# Patient Record
Sex: Female | Born: 1940 | Race: White | Hispanic: No | Smoking: Never smoker
Health system: Southern US, Community
[De-identification: ages and names within clinical notes are randomized; demographics above are authoritative.]

## PROBLEM LIST (undated history)

## (undated) DIAGNOSIS — I5022 Chronic systolic (congestive) heart failure: Secondary | ICD-10-CM

## (undated) DIAGNOSIS — Z45018 Encounter for adjustment and management of other part of cardiac pacemaker: Secondary | ICD-10-CM

## (undated) DIAGNOSIS — Z923 Personal history of irradiation: Secondary | ICD-10-CM

## (undated) DIAGNOSIS — J302 Other seasonal allergic rhinitis: Secondary | ICD-10-CM

## (undated) DIAGNOSIS — Z950 Presence of cardiac pacemaker: Secondary | ICD-10-CM

## (undated) DIAGNOSIS — I495 Sick sinus syndrome: Secondary | ICD-10-CM

## (undated) DIAGNOSIS — E039 Hypothyroidism, unspecified: Secondary | ICD-10-CM

## (undated) DIAGNOSIS — G43909 Migraine, unspecified, not intractable, without status migrainosus: Secondary | ICD-10-CM

## (undated) DIAGNOSIS — C50912 Malignant neoplasm of unspecified site of left female breast: Secondary | ICD-10-CM

## (undated) DIAGNOSIS — E079 Disorder of thyroid, unspecified: Secondary | ICD-10-CM

## (undated) DIAGNOSIS — Z9889 Other specified postprocedural states: Secondary | ICD-10-CM

## (undated) DIAGNOSIS — Z8719 Personal history of other diseases of the digestive system: Secondary | ICD-10-CM

## (undated) DIAGNOSIS — R112 Nausea with vomiting, unspecified: Secondary | ICD-10-CM

## (undated) DIAGNOSIS — K219 Gastro-esophageal reflux disease without esophagitis: Secondary | ICD-10-CM

## (undated) DIAGNOSIS — I10 Essential (primary) hypertension: Secondary | ICD-10-CM

## (undated) DIAGNOSIS — Z8711 Personal history of peptic ulcer disease: Secondary | ICD-10-CM

## (undated) DIAGNOSIS — E785 Hyperlipidemia, unspecified: Secondary | ICD-10-CM

## (undated) HISTORY — DX: Essential (primary) hypertension: I10

## (undated) HISTORY — DX: Migraine, unspecified, not intractable, without status migrainosus: G43.909

## (undated) HISTORY — PX: TONSILLECTOMY: SUR1361

## (undated) HISTORY — PX: VAGINAL HYSTERECTOMY: SUR661

## (undated) HISTORY — DX: Gastro-esophageal reflux disease without esophagitis: K21.9

## (undated) HISTORY — PX: CERVICAL DISC SURGERY: SHX588

## (undated) HISTORY — DX: Disorder of thyroid, unspecified: E07.9

## (undated) HISTORY — PX: FOOT SURGERY: SHX648

## (undated) HISTORY — DX: Hyperlipidemia, unspecified: E78.5

---
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
## 1998-08-27 ENCOUNTER — Other Ambulatory Visit: Admission: RE | Admit: 1998-08-27 | Discharge: 1998-08-27 | Payer: Self-pay | Admitting: Obstetrics and Gynecology

## 1999-08-28 ENCOUNTER — Other Ambulatory Visit: Admission: RE | Admit: 1999-08-28 | Discharge: 1999-08-28 | Payer: Self-pay | Admitting: Obstetrics and Gynecology

## 2000-10-27 ENCOUNTER — Other Ambulatory Visit: Admission: RE | Admit: 2000-10-27 | Discharge: 2000-10-27 | Payer: Self-pay | Admitting: Obstetrics and Gynecology

## 2001-11-22 ENCOUNTER — Other Ambulatory Visit: Admission: RE | Admit: 2001-11-22 | Discharge: 2001-11-22 | Payer: Self-pay | Admitting: Obstetrics and Gynecology

## 2002-12-29 ENCOUNTER — Other Ambulatory Visit: Admission: RE | Admit: 2002-12-29 | Discharge: 2002-12-29 | Payer: Self-pay | Admitting: Obstetrics and Gynecology

## 2003-01-10 ENCOUNTER — Ambulatory Visit (HOSPITAL_COMMUNITY): Admission: RE | Admit: 2003-01-10 | Discharge: 2003-01-10 | Payer: Self-pay | Admitting: *Deleted

## 2003-01-10 HISTORY — PX: UPPER GASTROINTESTINAL ENDOSCOPY: SHX188

## 2003-01-10 HISTORY — PX: COLONOSCOPY: SHX174

## 2004-04-10 ENCOUNTER — Other Ambulatory Visit: Admission: RE | Admit: 2004-04-10 | Discharge: 2004-04-10 | Payer: Self-pay | Admitting: Obstetrics and Gynecology

## 2004-06-19 ENCOUNTER — Ambulatory Visit (HOSPITAL_COMMUNITY): Admission: RE | Admit: 2004-06-19 | Discharge: 2004-06-19 | Payer: Self-pay | Admitting: Cardiology

## 2005-07-01 ENCOUNTER — Other Ambulatory Visit: Admission: RE | Admit: 2005-07-01 | Discharge: 2005-07-01 | Payer: Self-pay | Admitting: Obstetrics and Gynecology

## 2007-07-05 ENCOUNTER — Emergency Department (HOSPITAL_COMMUNITY): Admission: EM | Admit: 2007-07-05 | Discharge: 2007-07-05 | Payer: Self-pay | Admitting: Emergency Medicine

## 2007-10-22 ENCOUNTER — Encounter: Admission: RE | Admit: 2007-10-22 | Discharge: 2007-10-22 | Payer: Self-pay | Admitting: Internal Medicine

## 2008-01-19 ENCOUNTER — Ambulatory Visit: Payer: Self-pay | Admitting: Internal Medicine

## 2008-01-19 ENCOUNTER — Emergency Department (HOSPITAL_COMMUNITY): Admission: EM | Admit: 2008-01-19 | Discharge: 2008-01-19 | Payer: Self-pay | Admitting: Emergency Medicine

## 2008-02-07 ENCOUNTER — Ambulatory Visit: Payer: Self-pay | Admitting: Internal Medicine

## 2008-02-07 DIAGNOSIS — R05 Cough: Secondary | ICD-10-CM | POA: Insufficient documentation

## 2008-02-07 DIAGNOSIS — E785 Hyperlipidemia, unspecified: Secondary | ICD-10-CM

## 2008-02-07 DIAGNOSIS — E039 Hypothyroidism, unspecified: Secondary | ICD-10-CM | POA: Insufficient documentation

## 2008-02-07 DIAGNOSIS — J449 Chronic obstructive pulmonary disease, unspecified: Secondary | ICD-10-CM | POA: Insufficient documentation

## 2008-02-07 DIAGNOSIS — I10 Essential (primary) hypertension: Secondary | ICD-10-CM | POA: Insufficient documentation

## 2008-02-07 DIAGNOSIS — J45909 Unspecified asthma, uncomplicated: Secondary | ICD-10-CM | POA: Insufficient documentation

## 2008-02-07 LAB — CONVERTED CEMR LAB: IgE (Immunoglobulin E), Serum: 67.2 intl units/mL (ref 0.0–180.0)

## 2008-02-28 ENCOUNTER — Encounter: Payer: Self-pay | Admitting: Internal Medicine

## 2008-04-14 ENCOUNTER — Ambulatory Visit: Payer: Self-pay | Admitting: Internal Medicine

## 2008-05-29 ENCOUNTER — Ambulatory Visit: Payer: Self-pay | Admitting: Internal Medicine

## 2008-06-14 ENCOUNTER — Encounter: Admission: RE | Admit: 2008-06-14 | Discharge: 2008-06-14 | Payer: Self-pay | Admitting: Obstetrics and Gynecology

## 2008-07-10 ENCOUNTER — Ambulatory Visit: Payer: Self-pay | Admitting: Internal Medicine

## 2008-07-17 ENCOUNTER — Encounter: Admission: RE | Admit: 2008-07-17 | Discharge: 2008-07-17 | Payer: Self-pay | Admitting: Obstetrics and Gynecology

## 2008-08-10 ENCOUNTER — Ambulatory Visit: Payer: Self-pay | Admitting: Internal Medicine

## 2008-09-18 ENCOUNTER — Ambulatory Visit: Payer: Self-pay | Admitting: Internal Medicine

## 2008-09-18 DIAGNOSIS — M542 Cervicalgia: Secondary | ICD-10-CM | POA: Insufficient documentation

## 2008-10-23 ENCOUNTER — Ambulatory Visit: Payer: Self-pay | Admitting: Internal Medicine

## 2008-10-23 DIAGNOSIS — J45901 Unspecified asthma with (acute) exacerbation: Secondary | ICD-10-CM | POA: Insufficient documentation

## 2008-10-30 ENCOUNTER — Telehealth (INDEPENDENT_AMBULATORY_CARE_PROVIDER_SITE_OTHER): Payer: Self-pay | Admitting: *Deleted

## 2008-10-30 ENCOUNTER — Ambulatory Visit: Payer: Self-pay | Admitting: Internal Medicine

## 2008-10-30 DIAGNOSIS — R609 Edema, unspecified: Secondary | ICD-10-CM

## 2008-11-02 ENCOUNTER — Ambulatory Visit: Payer: Self-pay | Admitting: Internal Medicine

## 2008-11-20 ENCOUNTER — Ambulatory Visit: Payer: Self-pay | Admitting: Internal Medicine

## 2009-05-17 ENCOUNTER — Ambulatory Visit: Payer: Self-pay | Admitting: Internal Medicine

## 2009-05-17 DIAGNOSIS — J019 Acute sinusitis, unspecified: Secondary | ICD-10-CM | POA: Insufficient documentation

## 2009-06-04 ENCOUNTER — Ambulatory Visit: Payer: Self-pay | Admitting: Internal Medicine

## 2009-06-04 DIAGNOSIS — R0982 Postnasal drip: Secondary | ICD-10-CM | POA: Insufficient documentation

## 2009-06-04 DIAGNOSIS — L508 Other urticaria: Secondary | ICD-10-CM | POA: Insufficient documentation

## 2009-06-07 ENCOUNTER — Encounter: Payer: Self-pay | Admitting: Internal Medicine

## 2009-07-17 ENCOUNTER — Encounter: Payer: Self-pay | Admitting: Internal Medicine

## 2009-07-18 ENCOUNTER — Encounter: Admission: RE | Admit: 2009-07-18 | Discharge: 2009-07-18 | Payer: Self-pay | Admitting: Obstetrics and Gynecology

## 2010-09-03 ENCOUNTER — Encounter: Admission: RE | Admit: 2010-09-03 | Discharge: 2010-09-03 | Payer: Self-pay | Admitting: Obstetrics and Gynecology

## 2010-12-08 ENCOUNTER — Encounter: Payer: Self-pay | Admitting: Obstetrics and Gynecology

## 2011-04-01 NOTE — H&P (Signed)
NAME:  LEXANDRA, RETTKE               ACCOUNT NO.:  [id number]   MEDICAL RECORD NO.:  [id number]          PATIENT TYPE:  EMS   LOCATION:  MAJO                         FACILITY:  [facility]   PHYSICIAN:  Kalman Shan, MD   DATE OF BIRTH:  1941-01-14   DATE OF ADMISSION:  01/19/2008  DATE OF DISCHARGE:                              HISTORY & PHYSICAL   CHIEF COMPLAINT:  Shortness of breath and cough.   REFERRING PHYSICIAN:  Kerry Kass, M.D. [facility].   HISTORY OF PRESENT ILLNESS:  Clarine Elrod is a pleasant 70 year old  woman referred by Dr. Stevphen Rochester for elevation down in the emergency  room.  Earlier today I got a call from Dr. Corinda Gubler asking me to evaluate  the patient in the emergency room for worsening respiratory status and  possible admission.  The patient's history fluctuates. The best history  that I could elicit is that ever since April 2008 she has had on and off  cough that comes and goes randomly.  It is decreased with inhalers and  nebulized albuterol.  In October 2008 she saw Dr. Stevphen Rochester, who  suspected that she could have allergies and she has been on a low-dose  allergy shot for the last couple of weeks.  She describes this cough as  episodic, although at times she states that she has daily cough,  especially at night when she lies down.  Of note, she had chronic  postnasal drip all her life she says, and has been on nasal steroids,  possibly for several years.  However, I am not sure how compliant she  has been and her last of some sort of a nasal inhaler was yesterday.  In  addition, she has acid reflux disease which she thinks is quiescent  right now, although she has been on lisinopril hydrochlorothiazide for a  couple of years now.   The new complaint really is shortness of breath that started on January 17, 2008.  She describes this as episodic dyspnea.  She clearly says it is  new and it is now happening at rest.  She gets dyspneic when climbing  13  steps or taking a shower, but not with changing of clothes.  Albuterol  inhaler nebulizer improves dyspnea.  Taking a rest improves dyspnea.  This is the first dyspnea ever.  Prior to that, she never had dyspnea.   History of bronchitis.  It is unclear to me what she is describing as  bronchitis, but she says she has had 10 or 20 episodes since April 2008,  and each of these episodes are characterized by cough and colored  sputum.  Currently she has 1 such episode which has been going on for 1  week; no fever though.  She says for a similar episode back in October  2008 she went to [facility], and she was given high-dose  steroids and was told she had bronchitis,  but these steroids made her  jittery and she is uncomfortable taking prednisone.  Her last antibiotic  course was a Z-PAK in the summer of 2008.  Currently she is not on any  antibiotics.   PAST MEDICAL HISTORY:  1. Obesity.  2. Postnasal drip.  3. Acid reflux disease, currently quiescent per history.  4. Stomach peptic ulcer disease, not otherwise specified, several      years ago, currently quiet.  5. Allergic rhinitis diagnosed in October 2008.  6. Hyperlipidemia.  7. Hypothyroidism.  8. Denies asthma, COPD, obstructive sleep apnea, stroke or diabetes      mellitus.   FAMILY HISTORY:  Dad and a sister have asthma.  A great nephew also has  asthma.   ALLERGIES:  No known allergies.  She says that prednisone at high dose  made her jittery and insomniac.  She is willing to try prednisone at a  lower dose.  She is unclear at what dose and what duration of prednisone  made her have these symptoms.   SOCIAL HISTORY:  Never smoked.  No passive smoking.  Husband does not  smoke.  She is a housewife.  Husband is a Archivist.  Husband has  pulmonary fibrosis.  She has 2 kids.  [place]   REVIEW OF SYSTEMS:  As in history of present illness.  Currently  positive for cough,  yellow sputum, shortness of breath.  Denies chest  pain.  Denies hemoptysis.  Denies paroxysmal nocturnal dyspnea.  Denies  orthopnea.  Denies fever.  Denies edema.   MEDICATION LIST:  Includes lisinopril hydrochlorothiazide, Tricor,  estradiol, Synthroid, Symbicort.  Of note, she has been on Symbicort for  a few months.  She says she is compliant with this.   Nasal inhalers.  She did not give this as a med list, but on  questioning, she says she is on some sort of a nasal inhaler, presumed  steroids.   PHYSICAL EXAMINATION:  VITAL SIGNS:  Temperature 97.2 at 11:55, 98.3 at  2:30 p.m.  Pulse of 75-85.  Respiratory rate 18-20.  Saturation 95% on  room air.  GENERAL EXAM:  Alert and oriented x3, sitting comfortably on the  stretcher on bed 4 gurney in the emergency room.  Speech normal.  Ambulation normal.  Moves all 4 extremities normally.  RESPIRATORY EXAM:  Trachea is central.  Air entry equal.  Emergency room  noted wheeze, but at the time of exam, she had already gotten 1  albuterol nebulizer, and I did not notice any wheeze.  Air entry was  equal on both sides.  No crepitations.  CARDIOVASCULAR:  Normal heart sounds.  No murmurs.  NECK:  Supple.  Mallampati class II-III.  No elevated JVP.  No neck  nodes.  ABDOMEN:  Obese, soft, nontender.  No organomegaly.  No mass.  EXTREMITIES:  No cyanosis, no clubbing, no edema.  SKIN:  Intact.  MUSCULOSKELETAL EXAM:  No joint deformities.  Back exam is normal.   LABORATORY VALUES:  1. Arterial blood gas:  pH 7.46, pCO2 33, P02 76.  2. Chest x-ray:  This was done at Dr. Blossom Hoops office.  I do not have      a view box in the emergency room because it is an all digital      system now, but looking at it against the backdrop of a ceiling      fluorescent lamp, the x-ray looked normal.  3. D-dimer is normal.  4. BNP is less than 30.  CK and troponin are normal  5. Chemistries are all normal.  Creatinine 0.7, bicarb 26, potassium  is  low normal at 3.5.  Of note, she is on hydrochlorothiazide.      Magnesium and phosphorous are normal.  6. Complete blood count:  White count 8.1, hemoglobin 13.7, platelets      217,000.  7. CK and troponin.  These are pending at the time of my dictation.  8. Spirometry.  FEV1 1.23 liters, 63% predicted, FVC 2.12 liters, 77%      predicted.  FEV1/FVC ratio reduced at 58 against a normal of 76.      In that sense, PFTs show a moderate degree of obstruction.  Of      note, this is while on Symbicort.   ASSESSMENT:  This is a 70 year old obese nonsmoker who is on lisinopril  for hypertension.  She has had on-and-off cough since April 2008.  In  addition, for the last week she seems to have an acute flare up of  possible asthma with yellow sputum and worsening cough in association  with worsening shortness of breath.  There is no chest pain.  Laboratory  elevation, the only thing significant is a moderate degree of  obstruction on spirometry.  She is already on asthma treatment with  Symbicort for the last few months.   I believe she has acute asthma exacerbation, probably viral induced or  bacterial.  Chest x-ray is clear.  There is no obvious pneumonia.  In  addition, her cough could be cause by lisinopril.   PLAN:  1. I recommend stopping the lisinopril.  2. Talk to the primary care physician and start another anti-      hypertensive drug.  3. Optimize asthma medication with maintenance medication with      Symbicort, Nasonex, and albuterol.  These have been written out.  4. Treat acute asthma exacerbation with a 12 day trial of prednisone      starting at 40 mg and ending at 5 mg per day.  I have cautioned her      that some sort of insomnia and jitteriness is okay and as the dose      goes down it should be worse, in case there is any manic episode,      then she should call.  5. Follow up in clinic in 2 weeks.  I have given her my card.  6. In the interim, if symptoms worsen,  she is to go to the emergency      room.  7. Once patient is stable on followup, I will return her to Dr. Stevphen Rochester   Of note, her potassium is 3.5, and I gave her p.o. K in the emergency  room, 1 dose p.o. prednisone given in the emergency room.   D/W Dr. Delano Metz over telephone. Thank you for referral.      Kalman Shan, MD  Electronically Signed     MR/MEDQ  D:  01/19/2008  T:  01/19/2008  Job:  [id number]   cc:   Kerry Kass, M.D. [facility]

## 2011-04-04 NOTE — Op Note (Signed)
   NAME:  Erin Novak, Erin Novak                         ACCOUNT NO.:  [id number]   MEDICAL RECORD NO.:  [id number]                   PATIENT TYPE:  AMB   LOCATION:  ENDO                                 FACILITY:  [facility]   PHYSICIAN:  Georgiana Spinner, M.D.                 DATE OF BIRTH:  12/19/1940   DATE OF PROCEDURE:  01/10/2003  DATE OF DISCHARGE:                                 OPERATIVE REPORT   PROCEDURE:  Upper endoscopy.   INDICATIONS:  Gastroesophageal reflux disease.   ANESTHESIA:  Demerol 70, Versed 7 mg.   DESCRIPTION OF PROCEDURE:  With the patient mildly sedated in the left  lateral decubitus position, the Olympus video endoscope was inserted into  the mouth and passed under direct visualization through the esophagus which  appeared normal into the stomach. The fundus, body, antrum, duodenal bulb  and second portion of the duodenum all appeared normal.   From this point the endoscope was slowly withdrawn, taking circumferential  views and the entire duodenal mucosa was visualized. The endoscope was then  pulled back into the stomach and placed in the retroflexion to view the  stomach from below. A hiatal hernia was seen  and photographed. The  endoscope was straightened and withdrawn, taking circumferential views of  the remaining gastric and esophageal mucosa.   The patient's vital signs and pulse oximeter remained stable. The patient  tolerated the procedure well without apparent complications.   FINDINGS:  Hiatal hernia, otherwise unremarkable examination.   PLAN:  Proceed to colonoscopy as planned.                                               Georgiana Spinner, M.D.    GMO/MEDQ  D:  01/10/2003  T:  01/10/2003  Job:  [id number]

## 2011-04-04 NOTE — Op Note (Signed)
   NAME:  Erin Novak, Erin Novak                         ACCOUNT NO.:  [id number]   MEDICAL RECORD NO.:  [id number]                   PATIENT TYPE:  AMB   LOCATION:  ENDO                                 FACILITY:  [facility]   PHYSICIAN:  Georgiana Spinner, M.D.                 DATE OF BIRTH:  04/29/1941   DATE OF PROCEDURE:  01/10/2003  DATE OF DISCHARGE:                                 OPERATIVE REPORT   PROCEDURE:  Colonoscopy.   INDICATIONS:  Hemoccult positivity.   ANESTHESIA:  Demerol 30 mg, Versed 3 mg additionally.   DESCRIPTION OF PROCEDURE:  Patient mildly sedated in the left lateral  decubitus position.  The Olympus videoscopic colonoscope was inserted in the  rectum and passed under direct vision to the cecum, identified by ileocecal  valve and appendiceal orifice, both of which were photographed.  From this  point the colonoscope was slowly withdrawn, taking circumferential views of  the entire colonic mucosa, stopping only in the rectum, which appeared  normal on direct and showed hemorrhoids on retroflexed view.  The endoscope  was straightened and withdrawn.  The patient's vital signs and pulse  oximetry remained stable.  The patient tolerated the procedure well without  apparent complications.   FINDINGS:  Internal hemorrhoids, otherwise unremarkable examination.   PLAN:  Have the patient follow up with me as needed.                                               Georgiana Spinner, M.D.    GMO/MEDQ  D:  01/10/2003  T:  01/10/2003  Job:  [id number]

## 2011-04-04 NOTE — Cardiovascular Report (Signed)
NAME:  Erin Novak, Erin Novak                         ACCOUNT NO.:  [id number]   MEDICAL RECORD NO.:  [id number]                   PATIENT TYPE:  OIB   LOCATION:  2856                                 FACILITY:  [facility]   PHYSICIAN:  Aram Candela. Tysinger, M.D.              DATE OF BIRTH:  [birth year]   DATE OF PROCEDURE:  06/19/2004  DATE OF DISCHARGE:                              CARDIAC CATHETERIZATION   REFERRING PHYSICIAN:  Dr. Juline Patch   PROCEDURES:  1. Left heart catheterization.  2. Coronary cineangiography.  3. Left ventricular cineangiography.  4. Abdominal aortogram.  5. Angio-Seal of the right femoral artery.   INDICATION FOR PROCEDURES:  This 70 year old female patient of Dr. Ricki Miller, had  the recent onset of epigastric pain which also is in her mid chest and  radiated into both shoulders with numbness and aching.  She had an exercise  tolerance test done on June 18, 2004, which showed significant ST segment  depression and was associated with chest pain on the treadmill.  She was  then scheduled for cardiac cath.  She also has a history of hypertension,  hyperlipidemia, and mitral valve regurgitation.   DESCRIPTION OF PROCEDURE:  After signing an informed consent, the patient  was premedicated with 5 mg of Valium by mouth and brought to the cardiac  catheterization lab.  Her right groin was prepped and draped in a sterile  fashion and then anesthetized locally with 1% lidocaine.  A 6 French  introducer sheath was inserted percutaneously in to the right femoral  artery, and 6 [place] #4 Judkins coronary catheters were used to make  injections into the native coronary arteries.  A 6 French pigtail catheter  was used to measure pressures in the left ventricle and aorta and to make  midstream injections into the left ventricle and abdominal aorta.  The  patient tolerated the procedure well, and no complications are noted.  At  the end of the procedure, the catheter and sheath were  removed from the  right femoral artery, and hemostasis was easily obtained with an Angio-Seal  closure system.   MEDICATIONS GIVEN:  None.   CINE FINDINGS:  Coronary cineangiography left coronary artery:  The ostium  and left main appear normal.  Left anterior descending:  The LAD appears  normal.  Circumflex coronary artery:  Appears normal.  Right coronary  artery:  Appears normal.  Left ventricular cineangiogram:  The left  ventricular chamber size, contractility, and wall thickness appear normal.  There were several catheter-induced PVCs during the injection which caused  mitral insufficiency.  Otherwise, there was trace mitral insufficiency  noted.  The mitral valve otherwise looks normal without evidence of  calcification or prolapse.  The aortic valve appears normal with normal  three cusp valve and no evidence for calcification or thickening.  The  ascending aorta appears normal.  The descending aorta and the thorax appears  normal.  Abdominal aorta:  The abdominal aorta is normal in appearance  without significant atherosclerotic plaque and without aneurysm.  The renal  arteries are normal in appearance.   FINAL DIAGNOSES:  1. Normal coronary arteries.  2. Normal left ventricular function.  3. Trace to mild mitral insufficiency.  4. Normal aortic valve.  5. Normal abdominal aorta and renal arteries.  6. Successful Angio-Seal of the right femoral artery.   DISPOSITION:  The patient will be monitored on the short-stay unit prior to  discharge when stable.  She will be instructed to have a follow up  appointment with Dr. Juline Patch and also if there are growing  complications.                                               John R. Aleen Campi, M.D.    JRT/MEDQ  D:  06/19/2004  T:  06/19/2004  Job:  [id number]   cc:   Juline Patch, M.D.  [address]  [place], [place] [address]  Fax: [phone]   Cath Lab, [facility]

## 2011-05-29 ENCOUNTER — Encounter: Payer: Self-pay | Admitting: Internal Medicine

## 2011-06-12 ENCOUNTER — Encounter: Payer: Self-pay | Admitting: *Deleted

## 2011-06-12 ENCOUNTER — Ambulatory Visit (AMBULATORY_SURGERY_CENTER): Payer: Medicare Other | Admitting: *Deleted

## 2011-06-12 ENCOUNTER — Telehealth: Payer: Self-pay | Admitting: *Deleted

## 2011-06-12 DIAGNOSIS — R1013 Epigastric pain: Secondary | ICD-10-CM

## 2011-06-12 NOTE — Telephone Encounter (Signed)
Dr. Leone Payor, I abstracted the pt's information into her chart.  She does still have her gallbladder.  I reviewed her symptoms with her- she is having no colon problems, discomfort.  No history of colon CA in her family.  When describing her pain, it is the epigastric region; she has frequent belching and indigestion.  Takes Nexium 40mg  daily.  I did not cancel her procedure on 07-14-11 at 10:30 in case you decided to still do the procedure or change it to an EGD.    Thank you, Baxter Hire

## 2011-06-12 NOTE — Progress Notes (Signed)
Pt is having epigastric pain, frequent belching and indigestion.  Take Nexium 40 mg daily.  No c/o any colon problems, no hx of family colon CA.  Discussed with Dr. Leone Payor the above mentioned.  He Art therapist to abstract information to pt's chart and route info to him.  Writer explained this to pt and understanding voiced.  Procedure for 07-14-11 not cancelled in case MD decides to still do procedure or change to EGD.  Pt voiced understanding.

## 2011-06-19 NOTE — Telephone Encounter (Signed)
Patient has not had an [place] in the past.  She is scheduled for [place] for 06/20/11 8:30 at [facility] .  She is advised to be NPO after midnight

## 2011-06-19 NOTE — Telephone Encounter (Signed)
Addended by: Annett Fabian on: 06/19/2011 01:59 PM   Modules accepted: Orders

## 2011-06-19 NOTE — Telephone Encounter (Signed)
Please call her, see if she has had an abd [place] in past - if not I think she should have one re epigastric pain 789.06. Not convinced she needs colonoscopy and we may cancel but not yet - could need an EGD instead

## 2011-06-20 ENCOUNTER — Ambulatory Visit (HOSPITAL_COMMUNITY)
Admission: RE | Admit: 2011-06-20 | Discharge: 2011-06-20 | Disposition: A | Discharge status: Home / Self Care | Payer: Medicare Other | Source: Ambulatory Visit | Attending: Internal Medicine | Admitting: Internal Medicine

## 2011-06-20 DIAGNOSIS — K7689 Other specified diseases of liver: Secondary | ICD-10-CM | POA: Insufficient documentation

## 2011-06-20 DIAGNOSIS — K8020 Calculus of gallbladder without cholecystitis without obstruction: Secondary | ICD-10-CM | POA: Insufficient documentation

## 2011-06-20 DIAGNOSIS — R1013 Epigastric pain: Secondary | ICD-10-CM

## 2011-06-24 ENCOUNTER — Telehealth: Payer: Self-pay

## 2011-06-24 NOTE — Telephone Encounter (Signed)
Left message for pt to call back  °

## 2011-06-24 NOTE — Telephone Encounter (Signed)
Message copied by Michele Mcalpine on Tue Jun 24, 2011  1:03 PM ------      Message from: Iva Boop      Created: Tue Jun 24, 2011 12:12 PM       She has gallstones and this may be causing her pain.      I left a message about this - she has not seen me in office but this was started through pre-visit.      She has a pending colonoscopy 8/27 - may not need but have not cancelled.      I want her to see me in the office before 8/25.      She knows we will call again or she will call us (message)

## 2011-06-24 NOTE — Progress Notes (Signed)
Quick Note:  She has gallstones and this may be causing her pain. I left a message about this - she has not seen me in office but this was started through pre-visit. She has a pending colonoscopy 8/27 - may not need but have not cancelled. I want her to see me in the office before 8/25. She knows we will call again or she will call us (message) ______

## 2011-06-24 NOTE — Telephone Encounter (Signed)
Pt scheduled to see Dr. Leone Payor 07/10/11@9 :15am.

## 2011-06-25 NOTE — Telephone Encounter (Signed)
Pt scheduled for 07/10/11@9 :15am to see Dr. Leone Payor, pt aware of appt date and time.

## 2011-06-26 ENCOUNTER — Other Ambulatory Visit: Payer: Self-pay | Admitting: Internal Medicine

## 2011-07-10 ENCOUNTER — Encounter: Payer: Self-pay | Admitting: Internal Medicine

## 2011-07-10 ENCOUNTER — Ambulatory Visit (INDEPENDENT_AMBULATORY_CARE_PROVIDER_SITE_OTHER): Payer: Medicare Other | Admitting: Internal Medicine

## 2011-07-10 VITALS — BP 142/82 | HR 80 | Ht 63.0 in | Wt 177.6 lb

## 2011-07-10 DIAGNOSIS — E669 Obesity, unspecified: Secondary | ICD-10-CM

## 2011-07-10 DIAGNOSIS — K219 Gastro-esophageal reflux disease without esophagitis: Secondary | ICD-10-CM

## 2011-07-10 DIAGNOSIS — K8020 Calculus of gallbladder without cholecystitis without obstruction: Secondary | ICD-10-CM | POA: Insufficient documentation

## 2011-07-10 MED ORDER — OMEPRAZOLE 40 MG PO CPDR: 40.0000 mg | DELAYED_RELEASE_CAPSULE | Freq: Every day | ORAL | Status: DC

## 2011-07-10 NOTE — Patient Instructions (Addendum)
1) try to lose 10 pounds or more in the next 6 months. We have made a goal of 165 pounds. 2) Your Prliosec was changed to omeprazole 40 mg - take this daily for acid reflux - pick it up at your pharmacy today or tomorrow 3) We will schedule you a surgery appointment to discuss treatment of the gallstones at [facility]. We will contact you wioth the date and time.   4) We will let Dr. Ricki Miller know about this 5) Read the gallstone, obesity and GERD handouts and follow the instructions

## 2011-07-10 NOTE — Assessment & Plan Note (Signed)
She has this and responds to PPI and diet. Increase to 40 mg omeprazole and work on diet, weight loss. See me in 3 months to see about chronic meds. Have not performed EGD as no unitentoinal weight loss, dysphagia or bleeding

## 2011-07-10 NOTE — Assessment & Plan Note (Signed)
Less active after husbands death She is aware and will try to lose

## 2011-07-10 NOTE — Progress Notes (Signed)
  Subjective:    Patient ID: Erin Novak, female    DOB: 11/13/1941, 70 y.o.   MRN: [id number]  HPI she was sent for a colonoscopy but RN interview detected upper abdominal pain problems. I reviewed chart and discussed and had her do an abdominal US which showed a gallstone. Here to review that and evaluate in person.Previously followed by Dr. Virginia Rochester.  She has long-standing diagnosis of GERD made in 2004 (EGD - hiatal hernia). She has been on PPI off and on. She recalls most problems in the Spring and Fall. In the last few months she has developed epigastric pain that radiates to the sides. She notes that tomatoes and orange juice bother her. She avoids orange juice but has been eating a lot of tomatoes and tomato juice as well as cucumbers. Was eating a lot of lettuce also. No other triggers noted. Not necessarily related to eating in general but the specific triggers above also. No dysphagia. Belches a lot. This relieves things. She received Nexium and/or other PPI samples with help and then switched to Proliosec OTC. It helps but not as complete as the Rx PPI samples. She reduced intake of vegetables and tomatoes some, too. Caffeine - no coffee except rare cappucino, uses decaffeinated tea. No unintentional weight loss. Some loose stools reported with increased vegetable intake and fruits, jams. She feels better with reduced intake of these.    Review of Systems Some cough with allergies (cats)  All other ROS negative or as above     Objective:   Physical Exam General: Well-developed, well-nourished and in no acute distress Overweight-obese Vitals: Reviewed and listed above Eyes:anicteric. Mouth and posterior pharynx: normal.  Neck: supple w/o thyromegaly or mass.  Lungs: clear. Heart: S1S2, no rubs, murmurs, gallops. Abdomen: soft, non-tender, no hepatosplenomegaly, hernia, or mass and BS+.  Lymphatics: no cervical, Hoffman or inguinal nodes. Extremities:  no edema Skin some scaly  changes pre-tibial skin Neuro: nonfocal. A&O x 3.  Psych: appropriate mood and  affect.        Assessment & Plan:

## 2011-07-10 NOTE — Assessment & Plan Note (Addendum)
I think this stone(s) could be  symptomatic, along with GERD. Cannot sort it all out but would like her to discuss with a Careers adviser. Do not think she has colonic symptoms and not due for a repeat screening colonoscopy until 2014 - so colonoscopy cancelled. [facility] referral will be made.

## 2011-07-14 ENCOUNTER — Other Ambulatory Visit: Payer: Self-pay | Admitting: Internal Medicine

## 2011-07-15 ENCOUNTER — Ambulatory Visit (INDEPENDENT_AMBULATORY_CARE_PROVIDER_SITE_OTHER): Payer: Medicare Other | Admitting: Surgery

## 2011-07-15 ENCOUNTER — Encounter (INDEPENDENT_AMBULATORY_CARE_PROVIDER_SITE_OTHER): Payer: Self-pay | Admitting: Surgery

## 2011-07-15 VITALS — BP 142/88 | HR 62 | Temp 97.0°F | Ht 63.0 in | Wt 176.4 lb

## 2011-07-15 DIAGNOSIS — K8020 Calculus of gallbladder without cholecystitis without obstruction: Secondary | ICD-10-CM

## 2011-07-15 NOTE — Patient Instructions (Signed)
Call if persistent symptoms.  Will schedule surgery if patient desires. TMG

## 2011-07-15 NOTE — Progress Notes (Signed)
Chief Complaint  Patient presents with  . Abdominal Pain    gallstones - referral by Dr. Leone Payor    HISTORY: Patient is a 70 year old female referred by her gastroenterologist for evaluation for cholecystectomy due to symptomatic cholelithiasis. The patient has had intermittent abdominal pain over a number of years. She was recently evaluated by her gastroenterologist. This included an abdominal ultrasound which showed a solitary gallstone. There were no acute inflammatory signs. Patient denies any history of jaundice or acholic stools. She denies fever surgical site. She has had no upper abdominal surgery in the past. She denies any previous hepatobiliary disease. She denies any history of pancreatitis. There is no family history of gallbladder disease.  Patient has recently changed her diet and is currently pain free. She is not anxious to proceed with surgery at this time and would like to continue with her dietary changes to see if her symptoms improve.  Past Medical History  Diagnosis Date  . Allergy   . Asthma   . Bronchitis   . GERD (gastroesophageal reflux disease)   . Hyperlipidemia   . Hypertension   . Ulcer   . Thyroid disease     hypothyroid  . Migraines   . Cholelithiasis 06-20-2011    [place]   . Abdominal pain   . Diarrhea      Current Outpatient Prescriptions  Medication Sig Dispense Refill  . Budesonide-Formoterol Fumarate (SYMBICORT IN) Inhale into the lungs as needed.        . Cholecalciferol (D3 ADULT PO) Take 5,000 Units by mouth daily.        Marland Kitchen diltiazem (TIAZAC) 240 MG 24 hr capsule Take 240 mg by mouth daily.        . fish oil-omega-3 fatty acids 1000 MG capsule Take 1,200 mg by mouth daily.        Marland Kitchen levothyroxine (SYNTHROID, LEVOTHROID) 88 MCG tablet Take 88 mcg by mouth daily.        . NON FORMULARY Allergy injections weekly       . omeprazole (PRILOSEC) 40 MG capsule Take 1 capsule (40 mg total) by mouth daily. Take 30 minutes before breakfast  30 capsule  3       Allergies  Allergen Reactions  . Augmentin     Rash  . ZOX:WRUEAVWUJWJ+XBJYNWGNF+AOZHYQMVHQ Acid+Aspartame     REACTION: rash  . Lisinopril     REACTION: cough  . Percodan (Oxycodone-Aspirin) Nausea And Vomiting and Other (See Comments)    Sweating, unable to sleep  . Prednisone Other (See Comments)    Makes pt sweat, unable to sleep     Family History  Problem Relation Age of Onset  . Colon cancer Neg Hx   . Stomach cancer Neg Hx   . Esophageal cancer Neg Hx   . Asthma Father      History   Social History  . Marital Status: Married    Spouse Name: N/A    Number of Children: 2  . Years of Education: N/A   Occupational History  . Housewife    Social History Main Topics  . Smoking status: Never Smoker   . Smokeless tobacco: Never Used  . Alcohol Use: No  . Drug Use: No  . Sexually Active: None   Other Topics Concern  . None   Social History Narrative  . None     REVIEW OF SYSTEMS - PERTINENT POSITIVES ONLY: No history of jaundice. No history of acholic stools. No prior hepatobiliary disease.   EXAM:  Filed Vitals:   07/15/11 1521  BP: 142/88  Pulse: 62  Temp: 97 F (36.1 C)    HEENT: normocephalic; pupils equal and reactive; sclerae clear; dentition good; mucous membranes moist NECK:  ; symmetric on extension; no palpable anterior or posterior cervical lymphadenopathy; no supraclavicular masses; no tenderness CHEST: clear to auscultation bilaterally without rales, rhonchi, or wheezes CARDIAC: regular rate and rhythm without significant murmur; peripheral pulses are full ABDOMEN: Soft nontender without distention. Bowel sounds are present. No surgical wounds in the upper abdomen. No hepatosplenomegaly. No masses. No tenderness. EXT:  non-tender without edema; no deformity NEURO: no gross focal deficits; no sign of tremor   LABORATORY RESULTS: See E-Chart for most recent results   RADIOLOGY RESULTS: See E-Chart or I-Site for most recent  results   IMPRESSION: Symptomatic cholelithiasis, no evidence of acute cholecystitis.   PLAN: The patient and I had a lengthy discussion of the above findings. She does have documented cholelithiasis by ultrasound. There is no evidence of acute cholecystitis. She also has no evidence of complications from her gallstones at this point in time.  The patient and I discussed surgery at length. I provided her with written literature on laparoscopic cholecystectomy. We discussed the hospital stay to be anticipated and her recovery. We discussed the possibility of open cholecystectomy.  At this time the patient is symptom-free. She would like to continue to monitor her diet and see how she does. Certainly if her symptoms should recur, I have encouraged her to contact our office and schedule a date for cholecystectomy. She agrees.  The risks and benefits of the procedure have been discussed at length with the patient.  The patient understands the proposed procedure, potential alternative treatments, and the course of recovery to be expected.  All of the patient's questions have been answered at this time.  The patient wishes to proceed with surgery and will schedule a date for their procedure through our office staff.    Velora Heckler, MD, FACS General & Endocrine Surgery [facility]      Visit Diagnoses: 1. Cholelithiasis without obstruction     Primary Care Physician: Juline Patch, MD

## 2011-08-11 LAB — PHOSPHORUS: Phosphorus: 2.7

## 2011-08-11 LAB — POCT I-STAT 3, ART BLOOD GAS (G3+)
O2 Saturation: 96
Operator id: 234501
Patient temperature: 37
pCO2 arterial: 33.1 — ABNORMAL LOW
pO2, Arterial: 76 — ABNORMAL LOW

## 2011-08-11 LAB — MAGNESIUM: Magnesium: 1.9

## 2011-08-11 LAB — BASIC METABOLIC PANEL
BUN: 24 — ABNORMAL HIGH
Creatinine, Ser: 0.72
GFR calc Af Amer: >60
GFR calc non Af Amer: >60

## 2011-08-11 LAB — CBC
Platelets: 217
RBC: 4.46
WBC: 8.1

## 2011-08-11 LAB — DIFFERENTIAL
Lymphocytes Relative: 15
Lymphs Abs: 1.2
Monocytes Relative: 8
Neutro Abs: 4.5
Neutrophils Relative %: 55

## 2011-08-11 LAB — CK TOTAL AND CKMB (NOT AT ARMC)
CK, MB: 1.2
Relative Index: INVALID
Total CK: 60

## 2011-08-11 LAB — D-DIMER, QUANTITATIVE: D-Dimer, Quant: 0.37

## 2011-08-11 LAB — B-NATRIURETIC PEPTIDE (CONVERTED LAB): Pro B Natriuretic peptide (BNP): <30

## 2011-08-27 NOTE — Progress Notes (Signed)
Addended by: Maple Hudson on: 08/27/2011 03:25 PM   Modules accepted: Level of Service

## 2011-09-12 ENCOUNTER — Other Ambulatory Visit: Payer: Self-pay | Admitting: Obstetrics and Gynecology

## 2011-09-12 DIAGNOSIS — Z1231 Encounter for screening mammogram for malignant neoplasm of breast: Secondary | ICD-10-CM

## 2011-10-03 ENCOUNTER — Ambulatory Visit
Admission: RE | Admit: 2011-10-03 | Discharge: 2011-10-03 | Disposition: A | Discharge status: Home / Self Care | Payer: Medicare Other | Source: Ambulatory Visit | Attending: Obstetrics and Gynecology | Admitting: Obstetrics and Gynecology

## 2011-10-03 DIAGNOSIS — Z1231 Encounter for screening mammogram for malignant neoplasm of breast: Secondary | ICD-10-CM

## 2011-10-10 ENCOUNTER — Other Ambulatory Visit: Payer: Self-pay | Admitting: Obstetrics and Gynecology

## 2011-10-10 DIAGNOSIS — R928 Other abnormal and inconclusive findings on diagnostic imaging of breast: Secondary | ICD-10-CM

## 2011-10-15 ENCOUNTER — Encounter (HOSPITAL_COMMUNITY): Payer: Self-pay | Admitting: Pharmacy Technician

## 2011-10-21 ENCOUNTER — Ambulatory Visit
Admission: RE | Admit: 2011-10-21 | Discharge: 2011-10-21 | Disposition: A | Discharge status: Home / Self Care | Payer: Medicare Other | Source: Ambulatory Visit | Attending: Obstetrics and Gynecology | Admitting: Obstetrics and Gynecology

## 2011-10-21 ENCOUNTER — Other Ambulatory Visit (HOSPITAL_COMMUNITY): Payer: Medicare Other

## 2011-10-21 DIAGNOSIS — R928 Other abnormal and inconclusive findings on diagnostic imaging of breast: Secondary | ICD-10-CM

## 2011-10-23 ENCOUNTER — Encounter (HOSPITAL_COMMUNITY): Admission: RE | Payer: Self-pay | Source: Ambulatory Visit

## 2011-10-23 ENCOUNTER — Ambulatory Visit (HOSPITAL_COMMUNITY): Admission: RE | Admit: 2011-10-23 | Payer: Medicare Other | Source: Ambulatory Visit | Admitting: Surgery

## 2011-10-23 SURGERY — LAPAROSCOPIC CHOLECYSTECTOMY WITH INTRAOPERATIVE CHOLANGIOGRAM
Anesthesia: General

## 2011-11-26 ENCOUNTER — Encounter (INDEPENDENT_AMBULATORY_CARE_PROVIDER_SITE_OTHER): Payer: Self-pay | Admitting: Internal Medicine

## 2011-11-26 DIAGNOSIS — J309 Allergic rhinitis, unspecified: Secondary | ICD-10-CM | POA: Diagnosis not present

## 2011-11-26 DIAGNOSIS — Z124 Encounter for screening for malignant neoplasm of cervix: Secondary | ICD-10-CM | POA: Diagnosis not present

## 2011-11-26 DIAGNOSIS — Z1212 Encounter for screening for malignant neoplasm of rectum: Secondary | ICD-10-CM | POA: Diagnosis not present

## 2011-12-02 DIAGNOSIS — J309 Allergic rhinitis, unspecified: Secondary | ICD-10-CM | POA: Diagnosis not present

## 2011-12-03 DIAGNOSIS — M62838 Other muscle spasm: Secondary | ICD-10-CM | POA: Diagnosis not present

## 2011-12-08 DIAGNOSIS — J309 Allergic rhinitis, unspecified: Secondary | ICD-10-CM | POA: Diagnosis not present

## 2011-12-08 DIAGNOSIS — M62838 Other muscle spasm: Secondary | ICD-10-CM | POA: Diagnosis not present

## 2011-12-10 ENCOUNTER — Encounter (HOSPITAL_COMMUNITY): Payer: Self-pay

## 2011-12-10 ENCOUNTER — Encounter (INDEPENDENT_AMBULATORY_CARE_PROVIDER_SITE_OTHER): Payer: Self-pay

## 2011-12-16 ENCOUNTER — Other Ambulatory Visit (HOSPITAL_COMMUNITY): Payer: Medicare Other

## 2011-12-23 ENCOUNTER — Ambulatory Visit (HOSPITAL_COMMUNITY)
Admission: RE | Admit: 2011-12-23 | Discharge: 2011-12-23 | Disposition: A | Discharge status: Home / Self Care | Payer: Medicare Other | Source: Ambulatory Visit | Attending: Surgery | Admitting: Surgery

## 2011-12-23 ENCOUNTER — Encounter (HOSPITAL_COMMUNITY): Payer: Self-pay

## 2011-12-23 ENCOUNTER — Encounter (HOSPITAL_COMMUNITY)
Admission: RE | Admit: 2011-12-23 | Discharge: 2011-12-23 | Disposition: A | Discharge status: Home / Self Care | Payer: Medicare Other | Source: Ambulatory Visit | Attending: Surgery | Admitting: Surgery

## 2011-12-23 DIAGNOSIS — Z01811 Encounter for preprocedural respiratory examination: Secondary | ICD-10-CM | POA: Diagnosis not present

## 2011-12-23 DIAGNOSIS — Z01818 Encounter for other preprocedural examination: Secondary | ICD-10-CM | POA: Diagnosis not present

## 2011-12-23 DIAGNOSIS — Z01812 Encounter for preprocedural laboratory examination: Secondary | ICD-10-CM | POA: Insufficient documentation

## 2011-12-23 DIAGNOSIS — J309 Allergic rhinitis, unspecified: Secondary | ICD-10-CM | POA: Diagnosis not present

## 2011-12-23 HISTORY — DX: Other specified postprocedural states: Z98.890

## 2011-12-23 HISTORY — DX: Nausea with vomiting, unspecified: R11.2

## 2011-12-23 LAB — DIFFERENTIAL
Eosinophils Absolute: 0.2 10*3/uL (ref 0.0–0.7)
Lymphocytes Relative: 38 % (ref 12–46)
Lymphs Abs: 2.9 10*3/uL (ref 0.7–4.0)
Monocytes Relative: 11 % (ref 3–12)
Neutrophils Relative %: 47 % (ref 43–77)

## 2011-12-23 LAB — COMPREHENSIVE METABOLIC PANEL
Albumin: 4 g/dL (ref 3.5–5.2)
Alkaline Phosphatase: 91 U/L (ref 39–117)
BUN: 7 mg/dL (ref 6–23)
Calcium: 9.8 mg/dL (ref 8.4–10.5)
Potassium: 4.1 mEq/L (ref 3.5–5.1)
Sodium: 140 mEq/L (ref 135–145)
Total Protein: 7.8 g/dL (ref 6.0–8.3)

## 2011-12-23 LAB — URINALYSIS, ROUTINE W REFLEX MICROSCOPIC
Bilirubin Urine: NEGATIVE
Leukocytes, UA: NEGATIVE
Nitrite: NEGATIVE
Specific Gravity, Urine: 1.017 (ref 1.005–1.030)
Urobilinogen, UA: 0.2 mg/dL (ref 0.0–1.0)
pH: 7.5 (ref 5.0–8.0)

## 2011-12-23 LAB — CBC
HCT: 42.9 % (ref 36.0–46.0)
Hemoglobin: 14.4 g/dL (ref 12.0–15.0)
MCHC: 33.6 g/dL (ref 30.0–36.0)
MCV: 89 fL (ref 78.0–100.0)
RDW: 13.6 % (ref 11.5–15.5)

## 2011-12-23 LAB — PROTIME-INR
INR: 0.98 (ref 0.00–1.49)
Prothrombin Time: 13.2 seconds (ref 11.6–15.2)

## 2011-12-23 NOTE — Patient Instructions (Addendum)
20 Erin Novak  12/23/2011   Your procedure is scheduled on: 12/25/11   Report to [facility] at 5:30 AM.  Call this number if you have problems the morning of surgery: [phone]   Remember:   Do not eat food:After Midnight.  May have clear liquids: up to 4 Hours before arrival.( 6 HRS BEFORE SURGERY)  Clear liquids include soda, tea, black coffee, apple or grape juice, broth.  Take these medicines the morning of surgery with A SIP OF WATER: DILTIAZEM / LEVOXYL / SYMBICORT IF NEEDED   Do not wear jewelry, make-up or nail polish.  Do not wear lotions, powders, or perfumes.   Do not shave underarms or legs 48 hours prior to surgery (men may shave face)  Do not bring valuables to the hospital.  Contacts, dentures or bridgework may not be worn into surgery.  Leave suitcase in the car. After surgery it may be brought to your room.  For patients admitted to the hospital, checkout time is 11:00 AM the day of discharge.   Patients discharged the day of surgery will not be allowed to drive home.  Name and phone number of your driver:   Special Instructions: CHG Shower Use Special Wash: 1/2 bottle night before surgery and 1/2 bottle morning of surgery.   Please read over the following fact sheets that you were given: MRSA Information             DISCONTINUE ASPIRIN AND HERBAL MEDICATIONS 7 DAYS PREOP

## 2011-12-24 ENCOUNTER — Other Ambulatory Visit (INDEPENDENT_AMBULATORY_CARE_PROVIDER_SITE_OTHER): Payer: Self-pay | Admitting: Surgery

## 2011-12-25 ENCOUNTER — Ambulatory Visit (HOSPITAL_COMMUNITY): Payer: Medicare Other

## 2011-12-25 ENCOUNTER — Encounter (HOSPITAL_COMMUNITY): Payer: Self-pay | Admitting: *Deleted

## 2011-12-25 ENCOUNTER — Encounter (HOSPITAL_COMMUNITY)
Admission: RE | Disposition: A | Discharge status: Home / Self Care | Payer: Self-pay | Source: Ambulatory Visit | Attending: Surgery

## 2011-12-25 ENCOUNTER — Ambulatory Visit (HOSPITAL_COMMUNITY): Payer: Medicare Other | Admitting: *Deleted

## 2011-12-25 ENCOUNTER — Other Ambulatory Visit (INDEPENDENT_AMBULATORY_CARE_PROVIDER_SITE_OTHER): Payer: Self-pay | Admitting: Surgery

## 2011-12-25 ENCOUNTER — Ambulatory Visit (HOSPITAL_COMMUNITY)
Admission: RE | Admit: 2011-12-25 | Discharge: 2011-12-25 | Disposition: A | Discharge status: Home / Self Care | Payer: Medicare Other | Source: Ambulatory Visit | Attending: Surgery | Admitting: Surgery

## 2011-12-25 DIAGNOSIS — E785 Hyperlipidemia, unspecified: Secondary | ICD-10-CM | POA: Diagnosis not present

## 2011-12-25 DIAGNOSIS — Z90710 Acquired absence of both cervix and uterus: Secondary | ICD-10-CM | POA: Insufficient documentation

## 2011-12-25 DIAGNOSIS — K219 Gastro-esophageal reflux disease without esophagitis: Secondary | ICD-10-CM | POA: Diagnosis not present

## 2011-12-25 DIAGNOSIS — E039 Hypothyroidism, unspecified: Secondary | ICD-10-CM | POA: Diagnosis not present

## 2011-12-25 DIAGNOSIS — R110 Nausea: Secondary | ICD-10-CM | POA: Diagnosis not present

## 2011-12-25 DIAGNOSIS — J45909 Unspecified asthma, uncomplicated: Secondary | ICD-10-CM | POA: Diagnosis not present

## 2011-12-25 DIAGNOSIS — I10 Essential (primary) hypertension: Secondary | ICD-10-CM | POA: Insufficient documentation

## 2011-12-25 DIAGNOSIS — K8020 Calculus of gallbladder without cholecystitis without obstruction: Secondary | ICD-10-CM

## 2011-12-25 DIAGNOSIS — K8010 Calculus of gallbladder with chronic cholecystitis without obstruction: Secondary | ICD-10-CM | POA: Diagnosis not present

## 2011-12-25 DIAGNOSIS — Z79899 Other long term (current) drug therapy: Secondary | ICD-10-CM | POA: Diagnosis not present

## 2011-12-25 HISTORY — PX: CHOLECYSTECTOMY: SHX55

## 2011-12-25 SURGERY — LAPAROSCOPIC CHOLECYSTECTOMY WITH INTRAOPERATIVE CHOLANGIOGRAM
Anesthesia: General | Site: Abdomen | Wound class: Clean Contaminated

## 2011-12-25 MED ORDER — LIDOCAINE HCL (CARDIAC) 20 MG/ML IV SOLN
INTRAVENOUS | Status: DC | PRN
  Administered 2011-12-25: 100 mg via INTRAVENOUS

## 2011-12-25 MED ORDER — BUDESONIDE-FORMOTEROL FUMARATE 160-4.5 MCG/ACT IN AERO
2.0000 | INHALATION_SPRAY | Freq: Two times a day (BID) | RESPIRATORY_TRACT | Status: DC | PRN
  Filled 2011-12-25: qty 6

## 2011-12-25 MED ORDER — HYDROMORPHONE HCL PF 1 MG/ML IJ SOLN: 0.2500 mg | INTRAMUSCULAR | Status: DC | PRN

## 2011-12-25 MED ORDER — PROMETHAZINE HCL 25 MG/ML IJ SOLN: 6.2500 mg | INTRAMUSCULAR | Status: DC | PRN

## 2011-12-25 MED ORDER — MIDAZOLAM HCL 5 MG/5ML IJ SOLN
INTRAMUSCULAR | Status: DC | PRN
  Administered 2011-12-25: 2 mg via INTRAVENOUS

## 2011-12-25 MED ORDER — LACTATED RINGERS IV SOLN: INTRAVENOUS | Status: DC

## 2011-12-25 MED ORDER — ROCURONIUM BROMIDE 100 MG/10ML IV SOLN
INTRAVENOUS | Status: DC | PRN
  Administered 2011-12-25: 30 mg via INTRAVENOUS

## 2011-12-25 MED ORDER — 0.9 % SODIUM CHLORIDE (POUR BTL) OPTIME
TOPICAL | Status: DC | PRN
  Administered 2011-12-25: 1000 mL

## 2011-12-25 MED ORDER — LACTATED RINGERS IV SOLN
INTRAVENOUS | Status: DC | PRN
  Administered 2011-12-25: 07:00:00 via INTRAVENOUS

## 2011-12-25 MED ORDER — FENTANYL CITRATE 0.05 MG/ML IJ SOLN
INTRAMUSCULAR | Status: DC | PRN
  Administered 2011-12-25 (×2): 100 ug via INTRAVENOUS
  Administered 2011-12-25: 50 ug via INTRAVENOUS

## 2011-12-25 MED ORDER — CIPROFLOXACIN IN D5W 400 MG/200ML IV SOLN
400.0000 mg | INTRAVENOUS | Status: AC
  Administered 2011-12-25: 400 mg via INTRAVENOUS

## 2011-12-25 MED ORDER — SUCCINYLCHOLINE CHLORIDE 20 MG/ML IJ SOLN
INTRAMUSCULAR | Status: DC | PRN
  Administered 2011-12-25: 100 mg via INTRAVENOUS

## 2011-12-25 MED ORDER — LACTATED RINGERS IV SOLN
INTRAVENOUS | Status: DC
  Administered 2011-12-25: 10:00:00 via INTRAVENOUS

## 2011-12-25 MED ORDER — MEPERIDINE HCL 25 MG/ML IJ SOLN: 6.2500 mg | INTRAMUSCULAR | Status: DC | PRN

## 2011-12-25 MED ORDER — BUPIVACAINE-EPINEPHRINE 0.5% -1:200000 IJ SOLN
INTRAMUSCULAR | Status: DC | PRN
  Administered 2011-12-25: 20 mL

## 2011-12-25 MED ORDER — LACTATED RINGERS IR SOLN
Status: DC | PRN
  Administered 2011-12-25: 1000 mL

## 2011-12-25 MED ORDER — GLYCOPYRROLATE 0.2 MG/ML IJ SOLN
INTRAMUSCULAR | Status: DC | PRN
  Administered 2011-12-25: .6 mg via INTRAVENOUS

## 2011-12-25 MED ORDER — HYDROMORPHONE HCL PF 1 MG/ML IJ SOLN: 1.0000 mg | INTRAMUSCULAR | Status: DC | PRN

## 2011-12-25 MED ORDER — PROMETHAZINE HCL 25 MG/ML IJ SOLN: 12.5000 mg | Freq: Four times a day (QID) | INTRAMUSCULAR | Status: DC | PRN

## 2011-12-25 MED ORDER — ACETAMINOPHEN 10 MG/ML IV SOLN
INTRAVENOUS | Status: DC | PRN
  Administered 2011-12-25: 1000 mg via INTRAVENOUS

## 2011-12-25 MED ORDER — DEXAMETHASONE SODIUM PHOSPHATE 10 MG/ML IJ SOLN
INTRAMUSCULAR | Status: DC | PRN
  Administered 2011-12-25: 10 mg via INTRAVENOUS

## 2011-12-25 MED ORDER — TRAMADOL HCL 50 MG PO TABS
50.0000 mg | ORAL_TABLET | Freq: Four times a day (QID) | ORAL | Status: DC
  Filled 2011-12-25 (×4): qty 1

## 2011-12-25 MED ORDER — NEOSTIGMINE METHYLSULFATE 1 MG/ML IJ SOLN
INTRAMUSCULAR | Status: DC | PRN
  Administered 2011-12-25: 4 mg via INTRAVENOUS

## 2011-12-25 MED ORDER — SCOPOLAMINE 1 MG/3DAYS TD PT72
MEDICATED_PATCH | TRANSDERMAL | Status: DC | PRN
  Administered 2011-12-25: 1 via TRANSDERMAL

## 2011-12-25 MED ORDER — ONDANSETRON HCL 4 MG/2ML IJ SOLN
INTRAMUSCULAR | Status: DC | PRN
  Administered 2011-12-25: 4 mg via INTRAVENOUS

## 2011-12-25 MED ORDER — IOHEXOL 300 MG/ML  SOLN
INTRAMUSCULAR | Status: DC | PRN
  Administered 2011-12-25: 15 mL

## 2011-12-25 MED ORDER — PROPOFOL 10 MG/ML IV EMUL
INTRAVENOUS | Status: DC | PRN
  Administered 2011-12-25: 150 mg via INTRAVENOUS

## 2011-12-25 MED ORDER — LEVOTHYROXINE SODIUM 88 MCG PO TABS
88.0000 ug | ORAL_TABLET | Freq: Every day | ORAL | Status: DC
  Filled 2011-12-25: qty 1

## 2011-12-25 MED ORDER — DILTIAZEM HCL ER BEADS 240 MG PO CP24
240.0000 mg | ORAL_CAPSULE | Freq: Every day | ORAL | Status: DC
  Filled 2011-12-25: qty 1

## 2011-12-25 MED ORDER — TRAMADOL HCL 50 MG PO TABS: 50.0000 mg | ORAL_TABLET | Freq: Four times a day (QID) | ORAL | Status: AC

## 2011-12-25 SURGICAL SUPPLY — 35 items
APPLIER CLIP ROT 10 11.4 M/L (STAPLE) ×2
BENZOIN TINCTURE PRP APPL 2/3 (GAUZE/BANDAGES/DRESSINGS) ×2 IMPLANT
CABLE HIGH FREQUENCY MONO STRZ (ELECTRODE) ×2 IMPLANT
CANISTER SUCTION 2500CC (MISCELLANEOUS) ×2 IMPLANT
CHLORAPREP W/TINT 26ML (MISCELLANEOUS) ×2 IMPLANT
CLIP APPLIE ROT 10 11.4 M/L (STAPLE) ×1 IMPLANT
CLOTH BEACON ORANGE TIMEOUT ST (SAFETY) ×2 IMPLANT
COVER MAYO STAND STRL (DRAPES) ×2 IMPLANT
DECANTER SPIKE VIAL GLASS SM (MISCELLANEOUS) IMPLANT
DRAPE C-ARM 42X72 X-RAY (DRAPES) ×2 IMPLANT
DRAPE LAPAROSCOPIC ABDOMINAL (DRAPES) ×2 IMPLANT
ELECT REM PT RETURN 9FT ADLT (ELECTROSURGICAL) ×2
ELECTRODE REM PT RTRN 9FT ADLT (ELECTROSURGICAL) ×1 IMPLANT
GLOVE BIOGEL PI IND STRL 7.0 (GLOVE) ×1 IMPLANT
GLOVE BIOGEL PI INDICATOR 7.0 (GLOVE) ×1
GLOVE SURG ORTHO 8.0 STRL STRW (GLOVE) ×2 IMPLANT
GOWN STRL NON-REIN LRG LVL3 (GOWN DISPOSABLE) ×2 IMPLANT
GOWN STRL REIN XL XLG (GOWN DISPOSABLE) ×4 IMPLANT
HEMOSTAT SURGICEL 4X8 (HEMOSTASIS) IMPLANT
KIT BASIN OR (CUSTOM PROCEDURE TRAY) ×2 IMPLANT
NS IRRIG 1000ML POUR BTL (IV SOLUTION) ×2 IMPLANT
POUCH SPECIMEN RETRIEVAL 10MM (ENDOMECHANICALS) ×2 IMPLANT
SCISSORS LAP 5X35 DISP (ENDOMECHANICALS) IMPLANT
SET CHOLANGIOGRAPH MIX (MISCELLANEOUS) ×2 IMPLANT
SET IRRIG TUBING LAPAROSCOPIC (IRRIGATION / IRRIGATOR) ×2 IMPLANT
SLEEVE Z-THREAD 5X100MM (TROCAR) ×2 IMPLANT
SOLUTION ANTI FOG 6CC (MISCELLANEOUS) ×2 IMPLANT
STRIP CLOSURE SKIN 1/2X4 (GAUZE/BANDAGES/DRESSINGS) ×2 IMPLANT
SUT MNCRL AB 4-0 PS2 18 (SUTURE) ×2 IMPLANT
TOWEL OR 17X26 10 PK STRL BLUE (TOWEL DISPOSABLE) ×6 IMPLANT
TRAY LAP CHOLE (CUSTOM PROCEDURE TRAY) ×2 IMPLANT
TROCAR XCEL BLUNT TIP 100MML (ENDOMECHANICALS) ×2 IMPLANT
TROCAR Z-THREAD FIOS 11X100 BL (TROCAR) ×2 IMPLANT
TROCAR Z-THREAD FIOS 5X100MM (TROCAR) ×2 IMPLANT
TUBING INSUFFLATION 10FT LAP (TUBING) ×2 IMPLANT

## 2011-12-25 NOTE — Progress Notes (Signed)
Patient given discharge instructions , prescription was called in by doctor for pick up at [facility] pharmacy in [place], [place]. Patient aware of this. Verbalized understanding of instructions. Iv site removed, catheter tip intact. Tolerating diet, no c/o pain. Left unit in wheelchair accompanied by staff

## 2011-12-25 NOTE — Preoperative (Signed)
Beta Blockers   Reason not to administer Beta Blockers:Not Applicable 

## 2011-12-25 NOTE — H&P (Signed)
Erin Novak is an 71 y.o. female.   Chief Complaint: symptomatic cholelithiasis  HPI: Patient is a 71 year old female referred by her gastroenterologist for evaluation for cholecystectomy due to symptomatic cholelithiasis. The patient has had intermittent abdominal pain over a number of years. She was recently evaluated by her gastroenterologist. This included an abdominal ultrasound which showed a solitary gallstone. There were no acute inflammatory signs. Patient denies any history of jaundice or acholic stools. She denies fever surgical site. She has had no upper abdominal surgery in the past. She denies any previous hepatobiliary disease. She denies any history of pancreatitis. There is no family history of gallbladder disease.   Past Medical History  Diagnosis Date  . Allergy   . Asthma   . Bronchitis   . GERD (gastroesophageal reflux disease)   . Hyperlipidemia   . Hypertension   . Ulcer   . Thyroid disease     hypothyroid  . Migraines   . Cholelithiasis 06-20-2011    [place]   . Abdominal pain   . Diarrhea   . PONV (postoperative nausea and vomiting)     Past Surgical History  Procedure Date  . Hysterectomy   . Ruptured disk     in neck  . Foot surgery     right  . Colonoscopy 01/10/2003    internal hemorrhoids (Dr. Virginia Rochester)  . Upper gastrointestinal endoscopy 01/10/2003    hiatal hernia (Dr. Virginia Rochester)  . Abdominal hysterectomy     Family History  Problem Relation Age of Onset  . Colon cancer Neg Hx   . Stomach cancer Neg Hx   . Esophageal cancer Neg Hx   . Asthma Father    Social History:  reports that she has never smoked. She has never used smokeless tobacco. She reports that she does not drink alcohol or use illicit drugs.  Allergies:  Allergies  Allergen Reactions  . Augmentin     Rash  . ZOX:WRUEAVWUJWJ+XBJYNWGNF+AOZHYQMVHQ Acid+Aspartame     REACTION: rash  . Lisinopril Other (See Comments)    cough  . Percodan (Oxycodone-Aspirin) Nausea And Vomiting and Other  (See Comments)    Sweating, unable to sleep  . Prednisone Other (See Comments)    Makes pt sweat, unable to sleep    Medications Prior to Admission  Medication Dose Route Frequency Provider Last Rate Last Dose  . ciprofloxacin (CIPRO) IVPB 400 mg  400 mg Intravenous 120 min pre-op Velora Heckler, MD       Medications Prior to Admission  Medication Sig Dispense Refill  . budesonide-formoterol (SYMBICORT) 160-4.5 MCG/ACT inhaler Inhale 2 puffs into the lungs 2 (two) times daily as needed. For shortness of breath      . Cholecalciferol (VITAMIN D-3) 5000 UNITS TABS Take 5,000 Units by mouth daily.       Marland Kitchen diltiazem (TIAZAC) 240 MG 24 hr capsule Take 240 mg by mouth every morning.       Marland Kitchen ibuprofen (ADVIL,MOTRIN) 200 MG tablet Take 400 mg by mouth every 6 (six) hours as needed. For pain       . levothyroxine (SYNTHROID, LEVOTHROID) 88 MCG tablet Take 88 mcg by mouth every morning.       . Multiple Vitamins-Minerals (HAIR/SKIN/NAILS PO) Take 1 tablet by mouth daily.      . NON FORMULARY Inject 1 Syringe into the muscle once a week. Allergy injections weekly      . Omega-3 Fatty Acids (FISH OIL) 1200 MG CAPS Take 1,200 mg by mouth daily.  Results for orders placed during the hospital encounter of 12/23/11 (from the past 48 hour(s))  SURGICAL PCR SCREEN     Status: Normal   Collection Time   12/23/11  2:36 PM      Component Value Range Comment   MRSA, PCR NEGATIVE  NEGATIVE     Staphylococcus aureus NEGATIVE  NEGATIVE    URINALYSIS, ROUTINE W REFLEX MICROSCOPIC     Status: Abnormal   Collection Time   12/23/11  2:36 PM      Component Value Range Comment   Color, Urine YELLOW  YELLOW     APPearance CLOUDY (*) CLEAR     Specific Gravity, Urine 1.017  1.005 - 1.030     pH 7.5  5.0 - 8.0     Glucose, UA NEGATIVE  NEGATIVE (mg/dL)    Hgb urine dipstick NEGATIVE  NEGATIVE     Bilirubin Urine NEGATIVE  NEGATIVE     Ketones, ur NEGATIVE  NEGATIVE (mg/dL)    Protein, ur NEGATIVE  NEGATIVE  (mg/dL)    Urobilinogen, UA 0.2  0.0 - 1.0 (mg/dL)    Nitrite NEGATIVE  NEGATIVE     Leukocytes, UA NEGATIVE  NEGATIVE  MICROSCOPIC NOT DONE ON URINES WITH NEGATIVE PROTEIN, BLOOD, LEUKOCYTES, NITRITE, OR GLUCOSE <1000 mg/dL.  CBC     Status: Normal   Collection Time   12/23/11  3:25 PM      Component Value Range Comment   WBC 7.6  4.0 - 10.5 (K/uL)    RBC 4.82  3.87 - 5.11 (MIL/uL)    Hemoglobin 14.4  12.0 - 15.0 (g/dL)    HCT 45.4  09.8 - 11.9 (%)    MCV 89.0  78.0 - 100.0 (fL)    MCH 29.9  26.0 - 34.0 (pg)    MCHC 33.6  30.0 - 36.0 (g/dL)    RDW 14.7  82.9 - 56.2 (%)    Platelets 207  150 - 400 (K/uL)   COMPREHENSIVE METABOLIC PANEL     Status: Abnormal   Collection Time   12/23/11  3:25 PM      Component Value Range Comment   Sodium 140  135 - 145 (mEq/L)    Potassium 4.1  3.5 - 5.1 (mEq/L)    Chloride 105  96 - 112 (mEq/L)    CO2 27  19 - 32 (mEq/L)    Glucose, Bld 90  70 - 99 (mg/dL)    BUN 7  6 - 23 (mg/dL)    Creatinine, Ser 1.30  0.50 - 1.10 (mg/dL)    Calcium 9.8  8.4 - 10.5 (mg/dL)    Total Protein 7.8  6.0 - 8.3 (g/dL)    Albumin 4.0  3.5 - 5.2 (g/dL)    AST 20  0 - 37 (U/L)    ALT 23  0 - 35 (U/L)    Alkaline Phosphatase 91  39 - 117 (U/L)    Total Bilirubin 0.3  0.3 - 1.2 (mg/dL)    GFR calc non Af Amer 89 (*) >90 (mL/min)    GFR calc Af Amer >90  >90 (mL/min)   DIFFERENTIAL     Status: Normal   Collection Time   12/23/11  3:25 PM      Component Value Range Comment   Neutrophils Relative 47  43 - 77 (%)    Neutro Abs 3.6  1.7 - 7.7 (K/uL)    Lymphocytes Relative 38  12 - 46 (%)    Lymphs Abs 2.9  0.7 - 4.0 (K/uL)    Monocytes Relative 11  3 - 12 (%)    Monocytes Absolute 0.9  0.1 - 1.0 (K/uL)    Eosinophils Relative 2  0 - 5 (%)    Eosinophils Absolute 0.2  0.0 - 0.7 (K/uL)    Basophils Relative 1  0 - 1 (%)    Basophils Absolute 0.1  0.0 - 0.1 (K/uL)   PROTIME-INR     Status: Normal   Collection Time   12/23/11  3:25 PM      Component Value Range Comment     Prothrombin Time 13.2  11.6 - 15.2 (seconds)    INR 0.98  0.00 - 1.49     Dg Chest 2 View  12/23/2011  *RADIOLOGY REPORT*  Clinical Data: Preop for left scalp cholecystectomy  CHEST - 2 VIEW  Comparison: Chest x-ray of 07/05/2007  Findings: The lungs are clear.  Mediastinal contours appear stable. The heart is within upper limits of normal.  There are degenerative changes throughout the thoracic spine.  IMPRESSION: Stable chest x-ray.  No active lung disease.  Original Report Authenticated By: Juline Patch, M.D.    Review of Systems  Constitutional: Negative.   HENT: Negative.   Eyes: Negative.   Respiratory: Negative.   Cardiovascular: Negative.   Gastrointestinal: Positive for nausea and abdominal pain.  Genitourinary: Negative.   Musculoskeletal: Negative.   Skin: Negative.   Neurological: Negative.   Endo/Heme/Allergies: Negative.   Psychiatric/Behavioral: Negative.     Blood pressure 191/97, pulse 80, temperature 97 F (36.1 C), temperature source Oral, resp. rate 18, SpO2 97.00%. Physical Exam  Constitutional: She is oriented to person, place, and time. She appears well-developed and well-nourished.  HENT:  Head: Normocephalic and atraumatic.  Right Ear: External ear normal.  Left Ear: External ear normal.  Nose: Nose normal.  Mouth/Throat: Oropharynx is clear and moist.  Eyes: Conjunctivae and EOM are normal. Pupils are equal, round, and reactive to light.  Neck: Normal range of motion. Neck supple.       Limited ROM in extension  Cardiovascular: Normal rate, regular rhythm, normal heart sounds and intact distal pulses.   Respiratory: Effort normal and breath sounds normal.  GI: Soft. Bowel sounds are normal.  Musculoskeletal: Normal range of motion.  Neurological: She is alert and oriented to person, place, and time. She has normal reflexes.  Skin: Skin is warm and dry.  Psychiatric: She has a normal mood and affect. Her behavior is normal. Judgment and thought  content normal.     Assessment/Plan Symptomatic cholelithiasis  Plan lap chole with IOC  The risks and benefits of the procedure have been discussed at length with the patient.  The patient understands the proposed procedure, potential alternative treatments, and the course of recovery to be expected.  All of the patient's questions have been answered at this time.  The patient wishes to proceed with surgery.  Velora Heckler, MD, FACS General & Endocrine Surgery [facility]    Sadie Hazelett M 12/25/2011, 7:48 AM

## 2011-12-25 NOTE — Op Note (Signed)
Laparoscopic Cholecystectomy with IOC Procedure Note  Pre-operative Diagnosis: Calculus of gallbladder with other cholecystitis, without mention of obstruction  Post-operative Diagnosis: Same  Surgeon:  Velora Heckler, MD, FACS  Assistant:  Avel Peace, MD, FACS   Anesthesia:  General  Indications: This patient presents with symptomatic gallbladder disease and will undergo laparoscopic cholecystectomy.  Procedure Details  The patient was seen again in the Holding Room. The risks, benefits, complications, treatment options, and expected outcomes were discussed with the patient. The patient and/or family concurred with the proposed plan, giving informed consent.  The patient was taken to Operating Room, identified as Erin Novak and the procedure verified as Laparoscopic Cholecystectomy with Intraoperative Cholangiograms. A Time Out was held and the above information confirmed.  Prior to the induction of general anesthesia, antibiotic prophylaxis was administered. General endotracheal anesthesia was then administered and tolerated well. After the induction, the abdomen was prepped in the usual aseptic fashion. The patient was positioned in the supine position with some reverse Trendelenburg.  An incision was made in the skin near the umbilicus. The midline fascia was incised and the Hasson canula was introduced under direct vision. It was secured with a pursestring 0-Vicryl suture placed in the usual fashion. Pneumoperitoneum was then created with CO2 and tolerated well without any adverse changes in the patient's vital signs. Additional trocars were introduced under direct vision along the right costal margin in the midline, mid-clavicular line, and mid-axillary line.  The gallbladder was identified and the fundus grasped and retracted cephalad. Adhesions were lysed bluntly and with the electrocautery where needed, taking care not to injure any adjacent structures. The infundibulum was  grasped and retracted laterally, exposing the peritoneum overlying the triangle of Calot. This was then divided and exposed in a blunt fashion. The cystic duct was clearly identified and bluntly dissected circumferentially.  An incision was made in the cystic duct and the cholangiogram catheter introduced. The catheter was secured using an ligaclip.  Real-time cholangiography was performed using the C-arm.  There was rapid filling of a normal caliber common bile duct.  There was reflux of contrast into the left and right hepatic ductal systems.  There was free flow distally into the duodenum without filling defect or obstruction.  Catheter was removed from the peritoneal cavity.  The cystic duct was then triply ligated with surgical clips on the patient side and singly clipped on the gallbladder side and divided. The cystic artery was identified, dissected free, ligated with clips and divided as well.   The gallbladder was dissected from the liver bed with the electrocautery used for hemostasis. The gallbladder was completely removed and placed into an endocatch bag. The right upper quadrant was irrigated and inspected. Hemostasis was achieved with the electrocautery. Copious irrigation was utilized and was repeatedly aspirated until clear.  Pneumoperitoneum was released after viewing removal of the trocars with good hemostasis. The umbilical wound was irrigated and the fascia was then closed with the pursestring suture; the skin was then closed with 4-0 Monocril subcuticular sutures and a sterile dressing was applied.  Instrument, sponge, and needle counts were correct at closure and at the conclusion of the case.  Velora Heckler, MD, FACS General & Endocrine Surgery [facility]   Findings: Cholecystitis with Cholelithiasis  Estimated Blood Loss: Minimal         Drains: none         Specimens: Gallbladder to pathology       Complications: None; patient tolerated  the  procedure well.         Disposition: PACU - hemodynamically stable.         Condition: stable

## 2011-12-25 NOTE — Transfer of Care (Signed)
Immediate Anesthesia Transfer of Care Note  Patient: Erin Novak  Procedure(s) Performed:  LAPAROSCOPIC CHOLECYSTECTOMY WITH INTRAOPERATIVE CHOLANGIOGRAM - laparoscopic cholecystectomy with intraoperative cholangiogram  Patient Location: PACU  Anesthesia Type: General  Level of Consciousness: awake, alert , oriented and patient cooperative  Airway & Oxygen Therapy: Patient Spontanous Breathing and Patient connected to face mask oxygen  Post-op Assessment: Report given to PACU RN and Post -op Vital signs reviewed and stable  Post vital signs: Reviewed and stable  Complications: No apparent anesthesia complications

## 2011-12-25 NOTE — Anesthesia Preprocedure Evaluation (Addendum)
Anesthesia Evaluation  Patient identified by MRN, date of birth, ID band Patient awake    Reviewed: Allergy & Precautions, H&P , NPO status , Patient's Chart, lab work & pertinent test results  History of Anesthesia Complications (+) PONV  Airway Mallampati: II TM Distance: >3 FB Neck ROM: Full    Dental No notable dental hx.    Pulmonary neg pulmonary ROS, asthma ,  clear to auscultation  Pulmonary exam normal       Cardiovascular hypertension, Pt. on medications neg cardio ROS Regular Normal    Neuro/Psych Negative Neurological ROS  Negative Psych ROS   GI/Hepatic negative GI ROS, Neg liver ROS,   Endo/Other  Negative Endocrine ROSHypothyroidism   Renal/GU negative Renal ROS  Genitourinary negative   Musculoskeletal negative musculoskeletal ROS (+)   Abdominal   Peds negative pediatric ROS (+)  Hematology negative hematology ROS (+)   Anesthesia Other Findings   Reproductive/Obstetrics negative OB ROS                           Anesthesia Physical Anesthesia Plan  ASA: II  Anesthesia Plan: General   Post-op Pain Management:    Induction: Intravenous  Airway Management Planned: Oral ETT  Additional Equipment:   Intra-op Plan:   Post-operative Plan: Extubation in OR  Informed Consent: I have reviewed the patients History and Physical, chart, labs and discussed the procedure including the risks, benefits and alternatives for the proposed anesthesia with the patient or authorized representative who has indicated his/her understanding and acceptance.   Dental advisory given  Plan Discussed with: CRNA  Anesthesia Plan Comments:         Anesthesia Quick Evaluation

## 2011-12-25 NOTE — Discharge Summary (Signed)
Physician Discharge Summary [facility]  Patient ID: Erin Novak MRN: [id number] DOB/AGE: [birth year] 71 y.o.  Admit date: 12/25/2011 Discharge date: 12/25/2011  Admission Diagnoses:  Symptomatic cholelithiasis  Discharge Diagnoses:  Active Problems:  * No active hospital problems. *    Discharged Condition: good  Hospital Course: Patient admitted after lap chole.  Treated for mild nausea.  Patient improved, ambulated, voided, and tolerated a clear liquid diet.  Patient prepared for discharge.  Consults: None  Significant Diagnostic Studies: none  Treatments: surgery: lap chole with IOC  Discharge Exam: Blood pressure 161/76, pulse 84, temperature 98 F (36.7 C), temperature source Oral, resp. rate 18, height 5\' 3"  (1.6 m), weight 173 lb (78.472 kg), SpO2 96.00%. HEENT - clear Chest - clear Cor - RRR Abd - soft, dressing dry and intact Ext - no edema  Disposition: Home with family  Discharge Orders    Future Orders Please Complete By Expires   Diet - low sodium heart healthy      Increase activity slowly      Discharge instructions      Comments:   [facility] LAPAROSCOPIC SURGERY: POST OP INSTRUCTIONS  Always review your discharge instruction sheet given to you by the facility where your surgery was performed.  A prescription for pain medication may be given to you upon discharge.  Take your pain medication as prescribed, if needed.  If narcotic pain medicine is not needed, then you may take acetaminophen (Tylenol) or ibuprofen (Advil) as needed. Take your usually prescribed medications unless otherwise directed. If you need a refill on your pain medication, please contact your pharmacy.  They will contact our office to request authorization. Prescriptions will not be filled after 5pm or on week-ends. You should follow a light diet the first few days after arrival home, such as soup and crackers, etc.  Be sure to include lots  of fluids daily. Most patients will experience some swelling and bruising in the area of the incisions.  Ice packs will help.  Swelling and bruising can take several days to resolve.  It is common to experience some constipation if taking pain medication after surgery.  Increasing fluid intake and taking a stool softener (such as Colace) will usually help or prevent this problem from occurring.  A mild laxative (Milk of Magnesia or Miralax) should be taken according to package instructions if there are no bowel movements after 48 hours. Unless discharge instructions indicate otherwise, you may remove your bandages 24-48 hours after surgery, and you may shower at that time.  You may have steri-strips (small skin tapes) in place directly over the incision.  These strips should be left on the skin for 7-10 days.  If your surgeon used skin glue on the incision, you may shower in 24 hours.  The glue will flake off over the next 2-3 weeks.  Any sutures or staples will be removed at the office during your follow-up visit. ACTIVITIES:  You may resume regular (light) daily activities beginning the next day-such as daily self-care, walking, climbing stairs-gradually increasing activities as tolerated.  You may have sexual intercourse when it is comfortable.  Refrain from any heavy lifting or straining until approved by your doctor. You may drive when you are no longer taking prescription pain medication, you can comfortably wear a seatbelt, and you can safely maneuver your car and apply brakes. You should see your doctor in the office for a follow-up appointment approximately 2-3 weeks after  your surgery.  Make sure that you call for this appointment within a day or two after you arrive home to insure a convenient appointment time.  WHEN TO CALL YOUR DOCTOR: Fever over 101.0 Inability to urinate Continued bleeding from incision. Increased pain, redness, or drainage from the incision. Increasing abdominal  pain  The clinic staff is available to answer your questions during regular business hours.  Please don't hesitate to call and ask to speak to one of the nurses for clinical concerns.  If you have a medical emergency, go to the nearest emergency room or call 911.  A surgeon from [facility] is always on call at the hospital. [phone] ? [phone] ? FAX [phone] Web site: [email or web address]   Remove dressing in 48 hours        Medication List  As of 12/25/2011  5:22 PM   TAKE these medications         budesonide-formoterol 160-4.5 MCG/ACT inhaler   Commonly known as: SYMBICORT   Inhale 2 puffs into the lungs 2 (two) times daily as needed. For shortness of breath      diltiazem 240 MG 24 hr capsule   Commonly known as: TIAZAC   Take 240 mg by mouth every morning.      Fish Oil 1200 MG Caps   Take 1,200 mg by mouth daily.      HAIR/SKIN/NAILS PO   Take 1 tablet by mouth daily.      ibuprofen 200 MG tablet   Commonly known as: ADVIL,MOTRIN   Take 400 mg by mouth every 6 (six) hours as needed. For pain      levothyroxine 88 MCG tablet   Commonly known as: SYNTHROID, LEVOTHROID   Take 88 mcg by mouth every morning.      NON FORMULARY   Inject 1 Syringe into the muscle once a week. Allergy injections weekly      traMADol 50 MG tablet   Commonly known as: ULTRAM   Take 1 tablet (50 mg total) by mouth every 6 (six) hours.      Vitamin D-3 5000 UNITS Tabs   Take 5,000 Units by mouth daily.           Velora Heckler, MD, FACS General & Endocrine Surgery [facility]   Signed: Velora Heckler 12/25/2011, 5:22 PM

## 2011-12-25 NOTE — Anesthesia Postprocedure Evaluation (Signed)
  Anesthesia Post-op Note  Patient: Erin Novak  Procedure(s) Performed:  LAPAROSCOPIC CHOLECYSTECTOMY WITH INTRAOPERATIVE CHOLANGIOGRAM - laparoscopic cholecystectomy with intraoperative cholangiogram  Patient Location: PACU  Anesthesia Type: General  Level of Consciousness: awake and alert   Airway and Oxygen Therapy: Patient Spontanous Breathing  Post-op Pain: mild  Post-op Assessment: Post-op Vital signs reviewed, Patient's Cardiovascular Status Stable, Respiratory Function Stable, Patent Airway and No signs of Nausea or vomiting  Post-op Vital Signs: stable  Complications: No apparent anesthesia complications

## 2011-12-30 DIAGNOSIS — J309 Allergic rhinitis, unspecified: Secondary | ICD-10-CM | POA: Diagnosis not present

## 2012-01-05 ENCOUNTER — Telehealth (INDEPENDENT_AMBULATORY_CARE_PROVIDER_SITE_OTHER): Payer: Self-pay

## 2012-01-05 NOTE — Telephone Encounter (Signed)
C/O pain of sticking type of pain near navel area after  lap chole on 12/25/2011. Onset of sensation was this weekend.  No other  Complaints taking motrin for pain.  Having good bowel movements. Patient wanted know if this was normal. Patient told it may be a stitch however I would report this to Dr. Gerrit Friends. Post op appointment is on 01/28/2012. Patient stated area is intact with no drainage.

## 2012-01-15 DIAGNOSIS — I4460 Unspecified fascicular block: Secondary | ICD-10-CM | POA: Diagnosis not present

## 2012-01-15 DIAGNOSIS — J309 Allergic rhinitis, unspecified: Secondary | ICD-10-CM | POA: Diagnosis not present

## 2012-01-15 DIAGNOSIS — I10 Essential (primary) hypertension: Secondary | ICD-10-CM | POA: Diagnosis not present

## 2012-01-19 ENCOUNTER — Encounter (HOSPITAL_COMMUNITY): Payer: Self-pay | Admitting: Surgery

## 2012-01-19 DIAGNOSIS — J309 Allergic rhinitis, unspecified: Secondary | ICD-10-CM | POA: Diagnosis not present

## 2012-01-28 ENCOUNTER — Ambulatory Visit (INDEPENDENT_AMBULATORY_CARE_PROVIDER_SITE_OTHER): Payer: Medicare Other | Admitting: Surgery

## 2012-01-28 ENCOUNTER — Encounter (INDEPENDENT_AMBULATORY_CARE_PROVIDER_SITE_OTHER): Payer: Self-pay | Admitting: Surgery

## 2012-01-28 VITALS — BP 166/80 | HR 72 | Temp 98.2°F | Resp 18 | Ht 63.0 in | Wt 172.0 lb

## 2012-01-28 DIAGNOSIS — J309 Allergic rhinitis, unspecified: Secondary | ICD-10-CM | POA: Diagnosis not present

## 2012-01-28 DIAGNOSIS — K8020 Calculus of gallbladder without cholecystitis without obstruction: Secondary | ICD-10-CM

## 2012-01-28 NOTE — Progress Notes (Signed)
Visit Diagnoses: 1. Cholelithiasis without obstruction   2. Gallstones     HISTORY: Patient is a 71 year old white female who underwent laparoscopic cholecystectomy on December 25, 2011. 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 Postoperative course has been uneventful. She has noted occasional bowel movements and urgency. This does not occur every day. She denies any significant pain.  EXAM: Abdomen is soft nontender without distention. Surgical wounds are well-healed. No sign of herniation. Right upper quadrant is soft and nontender without mass.  IMPRESSION: Status post laparoscopic cholecystectomy for chronic cholecystitis and cholelithiasis  PLAN: Patient will apply topical creams or incisions. She will return to see me as needed. If she continues to have issues with loose bowel movements and urgency, I have encouraged her to seek further evaluation by her gastroenterologist.  Velora Heckler, MD, FACS General & Endocrine Surgery [facility]

## 2012-01-28 NOTE — Patient Instructions (Signed)
  COCOA BUTTER & VITAMIN E CREAM  ([employer] or other brand)  Apply cocoa butter/vitamin E cream to your incision 2 - 3 times daily.  Massage cream into incision for one minute with each application.  Use sunscreen (50 SPF or higher) for first 6 months after surgery if area is exposed to sun.  You may substitute Mederma or other scar reducing creams as desired.   

## 2012-02-11 DIAGNOSIS — J309 Allergic rhinitis, unspecified: Secondary | ICD-10-CM | POA: Diagnosis not present

## 2012-02-26 DIAGNOSIS — J309 Allergic rhinitis, unspecified: Secondary | ICD-10-CM | POA: Diagnosis not present

## 2012-03-04 DIAGNOSIS — J309 Allergic rhinitis, unspecified: Secondary | ICD-10-CM | POA: Diagnosis not present

## 2012-03-16 DIAGNOSIS — J309 Allergic rhinitis, unspecified: Secondary | ICD-10-CM | POA: Diagnosis not present

## 2012-03-25 DIAGNOSIS — J309 Allergic rhinitis, unspecified: Secondary | ICD-10-CM | POA: Diagnosis not present

## 2012-04-02 DIAGNOSIS — J45909 Unspecified asthma, uncomplicated: Secondary | ICD-10-CM | POA: Diagnosis not present

## 2012-04-02 DIAGNOSIS — J3081 Allergic rhinitis due to animal (cat) (dog) hair and dander: Secondary | ICD-10-CM | POA: Diagnosis not present

## 2012-04-02 DIAGNOSIS — J301 Allergic rhinitis due to pollen: Secondary | ICD-10-CM | POA: Diagnosis not present

## 2012-04-02 DIAGNOSIS — J309 Allergic rhinitis, unspecified: Secondary | ICD-10-CM | POA: Diagnosis not present

## 2012-04-02 DIAGNOSIS — H1045 Other chronic allergic conjunctivitis: Secondary | ICD-10-CM | POA: Diagnosis not present

## 2012-04-15 DIAGNOSIS — J309 Allergic rhinitis, unspecified: Secondary | ICD-10-CM | POA: Diagnosis not present

## 2012-04-21 DIAGNOSIS — J309 Allergic rhinitis, unspecified: Secondary | ICD-10-CM | POA: Diagnosis not present

## 2012-05-06 DIAGNOSIS — J309 Allergic rhinitis, unspecified: Secondary | ICD-10-CM | POA: Diagnosis not present

## 2012-05-14 DIAGNOSIS — J309 Allergic rhinitis, unspecified: Secondary | ICD-10-CM | POA: Diagnosis not present

## 2012-05-27 DIAGNOSIS — E039 Hypothyroidism, unspecified: Secondary | ICD-10-CM | POA: Diagnosis not present

## 2012-05-27 DIAGNOSIS — E7800 Pure hypercholesterolemia, unspecified: Secondary | ICD-10-CM | POA: Diagnosis not present

## 2012-05-27 DIAGNOSIS — J309 Allergic rhinitis, unspecified: Secondary | ICD-10-CM | POA: Diagnosis not present

## 2012-05-27 DIAGNOSIS — E119 Type 2 diabetes mellitus without complications: Secondary | ICD-10-CM | POA: Diagnosis not present

## 2012-06-01 DIAGNOSIS — J309 Allergic rhinitis, unspecified: Secondary | ICD-10-CM | POA: Diagnosis not present

## 2012-06-01 DIAGNOSIS — Z23 Encounter for immunization: Secondary | ICD-10-CM | POA: Diagnosis not present

## 2012-06-01 DIAGNOSIS — I10 Essential (primary) hypertension: Secondary | ICD-10-CM | POA: Diagnosis not present

## 2012-06-01 DIAGNOSIS — E7800 Pure hypercholesterolemia, unspecified: Secondary | ICD-10-CM | POA: Diagnosis not present

## 2012-06-02 DIAGNOSIS — H524 Presbyopia: Secondary | ICD-10-CM | POA: Diagnosis not present

## 2012-06-02 DIAGNOSIS — H35419 Lattice degeneration of retina, unspecified eye: Secondary | ICD-10-CM | POA: Diagnosis not present

## 2012-06-02 DIAGNOSIS — H5200 Hypermetropia, unspecified eye: Secondary | ICD-10-CM | POA: Diagnosis not present

## 2012-06-02 DIAGNOSIS — H2510 Age-related nuclear cataract, unspecified eye: Secondary | ICD-10-CM | POA: Diagnosis not present

## 2012-06-10 DIAGNOSIS — J309 Allergic rhinitis, unspecified: Secondary | ICD-10-CM | POA: Diagnosis not present

## 2012-06-15 DIAGNOSIS — J309 Allergic rhinitis, unspecified: Secondary | ICD-10-CM | POA: Diagnosis not present

## 2012-06-22 ENCOUNTER — Other Ambulatory Visit: Payer: Self-pay | Admitting: Dermatology

## 2012-06-22 DIAGNOSIS — L570 Actinic keratosis: Secondary | ICD-10-CM | POA: Diagnosis not present

## 2012-06-22 DIAGNOSIS — Z85828 Personal history of other malignant neoplasm of skin: Secondary | ICD-10-CM | POA: Diagnosis not present

## 2012-06-22 DIAGNOSIS — D485 Neoplasm of uncertain behavior of skin: Secondary | ICD-10-CM | POA: Diagnosis not present

## 2012-06-22 DIAGNOSIS — L821 Other seborrheic keratosis: Secondary | ICD-10-CM | POA: Diagnosis not present

## 2012-06-22 DIAGNOSIS — J309 Allergic rhinitis, unspecified: Secondary | ICD-10-CM | POA: Diagnosis not present

## 2012-06-30 DIAGNOSIS — J309 Allergic rhinitis, unspecified: Secondary | ICD-10-CM | POA: Diagnosis not present

## 2012-07-15 DIAGNOSIS — J309 Allergic rhinitis, unspecified: Secondary | ICD-10-CM | POA: Diagnosis not present

## 2012-07-23 DIAGNOSIS — J309 Allergic rhinitis, unspecified: Secondary | ICD-10-CM | POA: Diagnosis not present

## 2012-07-30 DIAGNOSIS — J309 Allergic rhinitis, unspecified: Secondary | ICD-10-CM | POA: Diagnosis not present

## 2012-08-05 DIAGNOSIS — J309 Allergic rhinitis, unspecified: Secondary | ICD-10-CM | POA: Diagnosis not present

## 2012-08-09 DIAGNOSIS — J309 Allergic rhinitis, unspecified: Secondary | ICD-10-CM | POA: Diagnosis not present

## 2012-08-11 DIAGNOSIS — J309 Allergic rhinitis, unspecified: Secondary | ICD-10-CM | POA: Diagnosis not present

## 2012-08-16 DIAGNOSIS — J309 Allergic rhinitis, unspecified: Secondary | ICD-10-CM | POA: Diagnosis not present

## 2012-08-26 DIAGNOSIS — J309 Allergic rhinitis, unspecified: Secondary | ICD-10-CM | POA: Diagnosis not present

## 2012-09-02 DIAGNOSIS — Z23 Encounter for immunization: Secondary | ICD-10-CM | POA: Diagnosis not present

## 2012-09-03 DIAGNOSIS — J309 Allergic rhinitis, unspecified: Secondary | ICD-10-CM | POA: Diagnosis not present

## 2012-09-06 DIAGNOSIS — J309 Allergic rhinitis, unspecified: Secondary | ICD-10-CM | POA: Diagnosis not present

## 2012-09-16 DIAGNOSIS — J309 Allergic rhinitis, unspecified: Secondary | ICD-10-CM | POA: Diagnosis not present

## 2012-09-20 ENCOUNTER — Other Ambulatory Visit: Payer: Self-pay | Admitting: Obstetrics and Gynecology

## 2012-09-20 DIAGNOSIS — Z1231 Encounter for screening mammogram for malignant neoplasm of breast: Secondary | ICD-10-CM

## 2012-09-21 DIAGNOSIS — J309 Allergic rhinitis, unspecified: Secondary | ICD-10-CM | POA: Diagnosis not present

## 2012-10-05 DIAGNOSIS — J309 Allergic rhinitis, unspecified: Secondary | ICD-10-CM | POA: Diagnosis not present

## 2012-10-12 DIAGNOSIS — J309 Allergic rhinitis, unspecified: Secondary | ICD-10-CM | POA: Diagnosis not present

## 2012-10-22 DIAGNOSIS — J309 Allergic rhinitis, unspecified: Secondary | ICD-10-CM | POA: Diagnosis not present

## 2012-10-26 ENCOUNTER — Ambulatory Visit
Admission: RE | Admit: 2012-10-26 | Discharge: 2012-10-26 | Disposition: A | Discharge status: Home / Self Care | Payer: Medicare Other | Source: Ambulatory Visit | Attending: Obstetrics and Gynecology | Admitting: Obstetrics and Gynecology

## 2012-10-26 DIAGNOSIS — J309 Allergic rhinitis, unspecified: Secondary | ICD-10-CM | POA: Diagnosis not present

## 2012-10-26 DIAGNOSIS — Z1231 Encounter for screening mammogram for malignant neoplasm of breast: Secondary | ICD-10-CM

## 2012-11-01 DIAGNOSIS — J309 Allergic rhinitis, unspecified: Secondary | ICD-10-CM | POA: Diagnosis not present

## 2012-11-08 DIAGNOSIS — J309 Allergic rhinitis, unspecified: Secondary | ICD-10-CM | POA: Diagnosis not present

## 2012-11-25 DIAGNOSIS — J309 Allergic rhinitis, unspecified: Secondary | ICD-10-CM | POA: Diagnosis not present

## 2012-11-29 DIAGNOSIS — E559 Vitamin D deficiency, unspecified: Secondary | ICD-10-CM | POA: Diagnosis not present

## 2012-11-29 DIAGNOSIS — E7800 Pure hypercholesterolemia, unspecified: Secondary | ICD-10-CM | POA: Diagnosis not present

## 2012-11-29 DIAGNOSIS — I10 Essential (primary) hypertension: Secondary | ICD-10-CM | POA: Diagnosis not present

## 2012-11-29 DIAGNOSIS — J309 Allergic rhinitis, unspecified: Secondary | ICD-10-CM | POA: Diagnosis not present

## 2012-12-02 DIAGNOSIS — K219 Gastro-esophageal reflux disease without esophagitis: Secondary | ICD-10-CM | POA: Diagnosis not present

## 2012-12-02 DIAGNOSIS — J329 Chronic sinusitis, unspecified: Secondary | ICD-10-CM | POA: Diagnosis not present

## 2012-12-02 DIAGNOSIS — J3489 Other specified disorders of nose and nasal sinuses: Secondary | ICD-10-CM | POA: Diagnosis not present

## 2012-12-02 DIAGNOSIS — R197 Diarrhea, unspecified: Secondary | ICD-10-CM | POA: Diagnosis not present

## 2012-12-03 ENCOUNTER — Institutional Professional Consult (permissible substitution): Payer: Medicare Other | Admitting: Internal Medicine

## 2012-12-13 DIAGNOSIS — J309 Allergic rhinitis, unspecified: Secondary | ICD-10-CM | POA: Diagnosis not present

## 2012-12-21 ENCOUNTER — Other Ambulatory Visit: Payer: Self-pay | Admitting: Dermatology

## 2012-12-21 DIAGNOSIS — D0460 Carcinoma in situ of skin of unspecified upper limb, including shoulder: Secondary | ICD-10-CM | POA: Diagnosis not present

## 2012-12-21 DIAGNOSIS — C44319 Basal cell carcinoma of skin of other parts of face: Secondary | ICD-10-CM | POA: Diagnosis not present

## 2012-12-21 DIAGNOSIS — J309 Allergic rhinitis, unspecified: Secondary | ICD-10-CM | POA: Diagnosis not present

## 2013-01-14 DIAGNOSIS — J309 Allergic rhinitis, unspecified: Secondary | ICD-10-CM | POA: Diagnosis not present

## 2013-01-27 DIAGNOSIS — D0460 Carcinoma in situ of skin of unspecified upper limb, including shoulder: Secondary | ICD-10-CM | POA: Diagnosis not present

## 2013-02-09 DIAGNOSIS — J309 Allergic rhinitis, unspecified: Secondary | ICD-10-CM | POA: Diagnosis not present

## 2013-02-15 DIAGNOSIS — J309 Allergic rhinitis, unspecified: Secondary | ICD-10-CM | POA: Diagnosis not present

## 2013-02-25 DIAGNOSIS — J309 Allergic rhinitis, unspecified: Secondary | ICD-10-CM | POA: Diagnosis not present

## 2013-03-02 DIAGNOSIS — J309 Allergic rhinitis, unspecified: Secondary | ICD-10-CM | POA: Diagnosis not present

## 2013-03-09 DIAGNOSIS — J309 Allergic rhinitis, unspecified: Secondary | ICD-10-CM | POA: Diagnosis not present

## 2013-03-11 DIAGNOSIS — J309 Allergic rhinitis, unspecified: Secondary | ICD-10-CM | POA: Diagnosis not present

## 2013-03-14 DIAGNOSIS — J309 Allergic rhinitis, unspecified: Secondary | ICD-10-CM | POA: Diagnosis not present

## 2013-03-18 DIAGNOSIS — J309 Allergic rhinitis, unspecified: Secondary | ICD-10-CM | POA: Diagnosis not present

## 2013-03-23 DIAGNOSIS — J309 Allergic rhinitis, unspecified: Secondary | ICD-10-CM | POA: Diagnosis not present

## 2013-03-30 DIAGNOSIS — J309 Allergic rhinitis, unspecified: Secondary | ICD-10-CM | POA: Diagnosis not present

## 2013-04-07 DIAGNOSIS — J301 Allergic rhinitis due to pollen: Secondary | ICD-10-CM | POA: Diagnosis not present

## 2013-04-07 DIAGNOSIS — J3081 Allergic rhinitis due to animal (cat) (dog) hair and dander: Secondary | ICD-10-CM | POA: Diagnosis not present

## 2013-04-07 DIAGNOSIS — H1045 Other chronic allergic conjunctivitis: Secondary | ICD-10-CM | POA: Diagnosis not present

## 2013-04-07 DIAGNOSIS — J019 Acute sinusitis, unspecified: Secondary | ICD-10-CM | POA: Diagnosis not present

## 2013-04-13 DIAGNOSIS — J309 Allergic rhinitis, unspecified: Secondary | ICD-10-CM | POA: Diagnosis not present

## 2013-04-20 DIAGNOSIS — J309 Allergic rhinitis, unspecified: Secondary | ICD-10-CM | POA: Diagnosis not present

## 2013-04-25 DIAGNOSIS — J309 Allergic rhinitis, unspecified: Secondary | ICD-10-CM | POA: Diagnosis not present

## 2013-05-13 DIAGNOSIS — J309 Allergic rhinitis, unspecified: Secondary | ICD-10-CM | POA: Diagnosis not present

## 2013-05-26 DIAGNOSIS — E559 Vitamin D deficiency, unspecified: Secondary | ICD-10-CM | POA: Diagnosis not present

## 2013-05-26 DIAGNOSIS — J309 Allergic rhinitis, unspecified: Secondary | ICD-10-CM | POA: Diagnosis not present

## 2013-05-26 DIAGNOSIS — E119 Type 2 diabetes mellitus without complications: Secondary | ICD-10-CM | POA: Diagnosis not present

## 2013-05-31 DIAGNOSIS — M79609 Pain in unspecified limb: Secondary | ICD-10-CM | POA: Diagnosis not present

## 2013-05-31 DIAGNOSIS — L600 Ingrowing nail: Secondary | ICD-10-CM | POA: Diagnosis not present

## 2013-06-01 DIAGNOSIS — J309 Allergic rhinitis, unspecified: Secondary | ICD-10-CM | POA: Diagnosis not present

## 2013-06-01 DIAGNOSIS — E559 Vitamin D deficiency, unspecified: Secondary | ICD-10-CM | POA: Diagnosis not present

## 2013-06-08 DIAGNOSIS — I10 Essential (primary) hypertension: Secondary | ICD-10-CM | POA: Diagnosis not present

## 2013-06-08 DIAGNOSIS — Z0000 Encounter for general adult medical examination without abnormal findings: Secondary | ICD-10-CM | POA: Diagnosis not present

## 2013-06-08 DIAGNOSIS — J309 Allergic rhinitis, unspecified: Secondary | ICD-10-CM | POA: Diagnosis not present

## 2013-06-08 DIAGNOSIS — Z1211 Encounter for screening for malignant neoplasm of colon: Secondary | ICD-10-CM | POA: Diagnosis not present

## 2013-06-08 DIAGNOSIS — E7800 Pure hypercholesterolemia, unspecified: Secondary | ICD-10-CM | POA: Diagnosis not present

## 2013-06-13 ENCOUNTER — Encounter: Payer: Self-pay | Admitting: Internal Medicine

## 2013-06-13 DIAGNOSIS — J309 Allergic rhinitis, unspecified: Secondary | ICD-10-CM | POA: Diagnosis not present

## 2013-07-01 DIAGNOSIS — J309 Allergic rhinitis, unspecified: Secondary | ICD-10-CM | POA: Diagnosis not present

## 2013-07-08 DIAGNOSIS — J309 Allergic rhinitis, unspecified: Secondary | ICD-10-CM | POA: Diagnosis not present

## 2013-07-14 DIAGNOSIS — J309 Allergic rhinitis, unspecified: Secondary | ICD-10-CM | POA: Diagnosis not present

## 2013-07-26 DIAGNOSIS — J309 Allergic rhinitis, unspecified: Secondary | ICD-10-CM | POA: Diagnosis not present

## 2013-07-26 DIAGNOSIS — L570 Actinic keratosis: Secondary | ICD-10-CM | POA: Diagnosis not present

## 2013-07-26 DIAGNOSIS — L821 Other seborrheic keratosis: Secondary | ICD-10-CM | POA: Diagnosis not present

## 2013-07-26 DIAGNOSIS — D239 Other benign neoplasm of skin, unspecified: Secondary | ICD-10-CM | POA: Diagnosis not present

## 2013-08-05 DIAGNOSIS — J309 Allergic rhinitis, unspecified: Secondary | ICD-10-CM | POA: Diagnosis not present

## 2013-08-11 ENCOUNTER — Ambulatory Visit (AMBULATORY_SURGERY_CENTER): Payer: Medicare Other | Admitting: *Deleted

## 2013-08-11 VITALS — Ht 63.0 in | Wt 180.6 lb

## 2013-08-11 DIAGNOSIS — Z1211 Encounter for screening for malignant neoplasm of colon: Secondary | ICD-10-CM

## 2013-08-11 DIAGNOSIS — J309 Allergic rhinitis, unspecified: Secondary | ICD-10-CM | POA: Diagnosis not present

## 2013-08-11 MED ORDER — NA SULFATE-K SULFATE-MG SULF 17.5-3.13-1.6 GM/177ML PO SOLN: 1.0000 | Freq: Once | ORAL | Status: DC

## 2013-08-11 NOTE — Progress Notes (Signed)
No egg or soy allergy. ewm Pt has hx of post op nausea /vomiting. ewm No other past issues with sedation. ewm No home 02 use or cpap use. ewm No emmi video due to limitied email use per pt. . ewm

## 2013-08-15 DIAGNOSIS — J309 Allergic rhinitis, unspecified: Secondary | ICD-10-CM | POA: Diagnosis not present

## 2013-08-19 DIAGNOSIS — Z23 Encounter for immunization: Secondary | ICD-10-CM | POA: Diagnosis not present

## 2013-08-23 DIAGNOSIS — J309 Allergic rhinitis, unspecified: Secondary | ICD-10-CM | POA: Diagnosis not present

## 2013-08-25 ENCOUNTER — Ambulatory Visit (AMBULATORY_SURGERY_CENTER): Payer: Medicare Other | Admitting: Internal Medicine

## 2013-08-25 ENCOUNTER — Encounter: Payer: Self-pay | Admitting: Internal Medicine

## 2013-08-25 VITALS — BP 180/81 | HR 70 | Temp 96.8°F | Resp 17 | Ht 63.0 in | Wt 180.0 lb

## 2013-08-25 DIAGNOSIS — K644 Residual hemorrhoidal skin tags: Secondary | ICD-10-CM

## 2013-08-25 DIAGNOSIS — I10 Essential (primary) hypertension: Secondary | ICD-10-CM | POA: Diagnosis not present

## 2013-08-25 DIAGNOSIS — K5730 Diverticulosis of large intestine without perforation or abscess without bleeding: Secondary | ICD-10-CM

## 2013-08-25 DIAGNOSIS — K219 Gastro-esophageal reflux disease without esophagitis: Secondary | ICD-10-CM | POA: Diagnosis not present

## 2013-08-25 DIAGNOSIS — J45909 Unspecified asthma, uncomplicated: Secondary | ICD-10-CM | POA: Diagnosis not present

## 2013-08-25 DIAGNOSIS — Z1211 Encounter for screening for malignant neoplasm of colon: Secondary | ICD-10-CM | POA: Diagnosis not present

## 2013-08-25 DIAGNOSIS — E039 Hypothyroidism, unspecified: Secondary | ICD-10-CM | POA: Diagnosis not present

## 2013-08-25 MED ORDER — SODIUM CHLORIDE 0.9 % IV SOLN: 500.0000 mL | INTRAVENOUS | Status: DC

## 2013-08-25 NOTE — Patient Instructions (Addendum)
No polyps or cancer!  You have some hemorrhoids and you also have diverticulosis - a common condition that is not usually a problem.  If you have hemorrhoid problems (swelling, itching, bleeding) I am able to treat those with an in-office procedure. If you like, please call my office at [phone] to schedule an appointment and I can evaluate you further.   I appreciate the opportunity to care for you. Iva Boop, MD, [facility]   Handouts were given to your care partner on hemorrhoids and diverticulosis. You may resume your current medications today. Please call if you have any questions or concerns.   YOU HAD AN ENDOSCOPIC PROCEDURE TODAY AT THE [facility]: Refer to the procedure report that was given to you for any specific questions about what was found during the examination.  If the procedure report does not answer your questions, please call your gastroenterologist to clarify.  If you requested that your care partner not be given the details of your procedure findings, then the procedure report has been included in a sealed envelope for you to review at your convenience later.  YOU SHOULD EXPECT: Some feelings of bloating in the abdomen. Passage of more gas than usual.  Walking can help get rid of the air that was put into your GI tract during the procedure and reduce the bloating. If you had a lower endoscopy (such as a colonoscopy or flexible sigmoidoscopy) you may notice spotting of blood in your stool or on the toilet paper. If you underwent a bowel prep for your procedure, then you may not have a normal bowel movement for a few days.  DIET: Your first meal following the procedure should be a light meal and then it is ok to progress to your normal diet.  A half-sandwich or bowl of soup is an example of a good first meal.  Heavy or fried foods are harder to digest and may make you feel nauseous or bloated.  Likewise meals heavy in dairy and vegetables can cause extra gas to  form and this can also increase the bloating.  Drink plenty of fluids but you should avoid alcoholic beverages for 24 hours.  ACTIVITY: Your care partner should take you home directly after the procedure.  You should plan to take it easy, moving slowly for the rest of the day.  You can resume normal activity the day after the procedure however you should NOT DRIVE or use heavy machinery for 24 hours (because of the sedation medicines used during the test).    SYMPTOMS TO REPORT IMMEDIATELY: A gastroenterologist can be reached at any hour.  During normal business hours, 8:30 AM to 5:00 PM Monday through Friday, call [phone].  After hours and on weekends, please call the GI answering service at [phone] who will take a message and have the physician on call contact you.   Following lower endoscopy (colonoscopy or flexible sigmoidoscopy):  Excessive amounts of blood in the stool  Significant tenderness or worsening of abdominal pains  Swelling of the abdomen that is new, acute  Fever of 100F or higher   FOLLOW UP: If any biopsies were taken you will be contacted by phone or by letter within the next 1-3 weeks.  Call your gastroenterologist if you have not heard about the biopsies in 3 weeks.  Our staff will call the home number listed on your records the next business day following your procedure to check on you and address any questions or concerns  that you may have at that time regarding the information given to you following your procedure. This is a courtesy call and so if there is no answer at the home number and we have not heard from you through the emergency physician on call, we will assume that you have returned to your regular daily activities without incident.  SIGNATURES/CONFIDENTIALITY: You and/or your care partner have signed paperwork which will be entered into your electronic medical record.  These signatures attest to the fact that that the information above on your  After Visit Summary has been reviewed and is understood.  Full responsibility of the confidentiality of this discharge information lies with you and/or your care-partner.

## 2013-08-25 NOTE — Progress Notes (Signed)
Lidocaine-40mg IV prior to Propofol InductionPropofol given over incremental dosages 

## 2013-08-25 NOTE — Op Note (Addendum)
[facility] [address] [place], [address]   COLONOSCOPY PROCEDURE REPORT  PATIENT: Erin Novak, Erin Novak  MR#: [id number] BIRTHDATE: 22-Aug-1941 , 72  yrs. old GENDER: Female ENDOSCOPIST: Iva Boop, MD, [facility] PROCEDURE DATE:  08/25/2013 PROCEDURE: First Screening Colonoscopy - Avg.  risk and is 50 yrs.  old or older - No.  Prior Negative Screening - Now for repeat screening. 10 or more years since last screening  History of Adenoma - Now for follow-up colonoscopy & has been > or = to 3 yrs.  N/A  Polyps Removed Today? No.  Recommend repeat exam, <10 yrs? No. ASA CLASS:   Class II INDICATIONS:average risk screening and Last colonoscopy performed 10 years ago. MEDICATIONS: propofol (Diprivan) 100mg  IV, MAC sedation, administered by CRNA, and These medications were titrated to patient response per physician's verbal order  DESCRIPTION OF PROCEDURE:   After the risks benefits and alternatives of the procedure were thoroughly explained, informed consent was obtained.  A digital rectal exam revealed no rectal mass and A digital rectal exam revealed several skin tags.   The LB [id number]  endoscope was introduced through the anus and advanced to the cecum, which was identified by both the appendix and ileocecal valve. No adverse events experienced.   The quality of the prep was Suprep good  The instrument was then slowly withdrawn as the colon was fully examined.   COLON FINDINGS: Moderate diverticulosis was noted in the sigmoid colon.   Mild diverticulosis was noted in the ascending colon. The colon mucosa was otherwise normal.   A right colon retroflexion was performed.  Retroflexed views revealed internal/external hemorrhoids. The time to cecum=3 minutes 0 seconds.  Withdrawal time=7 minutes 57 seconds.  The scope was withdrawn and the procedure completed. COMPLICATIONS: There were no complications.  ENDOSCOPIC IMPRESSION: 1.   Moderate diverticulosis  was noted in the sigmoid colon 2.   Mild diverticulosis was noted in the ascending colon 3.   The colon mucosa was otherwise normal - good prep - second colonoscopy (both no polyps) 4.   Internal hemorrhoids 5.   External hemorrhoids  RECOMMENDATIONS: 1.  OP follow-up is advised on a PRN basis. Does not need routine colonoscopy or hemoccvults anymore. 2.   If hemorrhoids are symptomatic consider banding.   eSigned:  Iva Boop, MD, [facility] 08/25/2013 9:04 AM cc: The Patient and Juline Patch, MD

## 2013-08-25 NOTE — Progress Notes (Signed)
Per the pt she has not taken her regular medications for 2 days.  Her b/p has been eveaated.  I encouraged her to take her medications asap when she gets home.  Pt said she would.  No complaints noted in the recovery room. maw

## 2013-08-26 ENCOUNTER — Telehealth: Payer: Self-pay | Admitting: *Deleted

## 2013-08-26 NOTE — Telephone Encounter (Signed)
  Follow up Call-  Call back number 08/25/2013  Post procedure Call Back phone  # [phone]  Permission to leave phone message Yes     Patient questions:  Do you have a fever, pain , or abdominal swelling? no Pain Score  0 *  Have you tolerated food without any problems? yes  Have you been able to return to your normal activities? yes  Do you have any questions about your discharge instructions: Diet   no Medications  no Follow up visit  no  Do you have questions or concerns about your Care? no  Actions: * If pain score is 4 or above: No action needed, pain <4.

## 2013-09-08 DIAGNOSIS — J309 Allergic rhinitis, unspecified: Secondary | ICD-10-CM | POA: Diagnosis not present

## 2013-09-12 DIAGNOSIS — J309 Allergic rhinitis, unspecified: Secondary | ICD-10-CM | POA: Diagnosis not present

## 2013-09-14 DIAGNOSIS — J309 Allergic rhinitis, unspecified: Secondary | ICD-10-CM | POA: Diagnosis not present

## 2013-09-20 DIAGNOSIS — J309 Allergic rhinitis, unspecified: Secondary | ICD-10-CM | POA: Diagnosis not present

## 2013-09-22 DIAGNOSIS — J309 Allergic rhinitis, unspecified: Secondary | ICD-10-CM | POA: Diagnosis not present

## 2013-09-29 DIAGNOSIS — J309 Allergic rhinitis, unspecified: Secondary | ICD-10-CM | POA: Diagnosis not present

## 2013-10-05 DIAGNOSIS — J309 Allergic rhinitis, unspecified: Secondary | ICD-10-CM | POA: Diagnosis not present

## 2013-10-12 ENCOUNTER — Other Ambulatory Visit: Payer: Self-pay

## 2013-10-12 DIAGNOSIS — Z1231 Encounter for screening mammogram for malignant neoplasm of breast: Secondary | ICD-10-CM

## 2013-10-19 DIAGNOSIS — J309 Allergic rhinitis, unspecified: Secondary | ICD-10-CM | POA: Diagnosis not present

## 2013-10-25 DIAGNOSIS — J309 Allergic rhinitis, unspecified: Secondary | ICD-10-CM | POA: Diagnosis not present

## 2013-11-02 DIAGNOSIS — J309 Allergic rhinitis, unspecified: Secondary | ICD-10-CM | POA: Diagnosis not present

## 2013-11-07 DIAGNOSIS — J309 Allergic rhinitis, unspecified: Secondary | ICD-10-CM | POA: Diagnosis not present

## 2013-11-22 ENCOUNTER — Ambulatory Visit
Admission: RE | Admit: 2013-11-22 | Discharge: 2013-11-22 | Disposition: A | Discharge status: Home / Self Care | Payer: Medicare Other | Source: Ambulatory Visit

## 2013-11-22 DIAGNOSIS — Z1231 Encounter for screening mammogram for malignant neoplasm of breast: Secondary | ICD-10-CM

## 2013-11-22 DIAGNOSIS — J309 Allergic rhinitis, unspecified: Secondary | ICD-10-CM | POA: Diagnosis not present

## 2013-12-02 DIAGNOSIS — J309 Allergic rhinitis, unspecified: Secondary | ICD-10-CM | POA: Diagnosis not present

## 2013-12-14 DIAGNOSIS — J309 Allergic rhinitis, unspecified: Secondary | ICD-10-CM | POA: Diagnosis not present

## 2013-12-15 DIAGNOSIS — R6889 Other general symptoms and signs: Secondary | ICD-10-CM | POA: Diagnosis not present

## 2013-12-15 DIAGNOSIS — J984 Other disorders of lung: Secondary | ICD-10-CM | POA: Diagnosis not present

## 2013-12-15 DIAGNOSIS — J45901 Unspecified asthma with (acute) exacerbation: Secondary | ICD-10-CM | POA: Diagnosis not present

## 2013-12-15 DIAGNOSIS — J209 Acute bronchitis, unspecified: Secondary | ICD-10-CM | POA: Diagnosis not present

## 2013-12-19 DIAGNOSIS — J45901 Unspecified asthma with (acute) exacerbation: Secondary | ICD-10-CM | POA: Diagnosis not present

## 2013-12-19 DIAGNOSIS — J309 Allergic rhinitis, unspecified: Secondary | ICD-10-CM | POA: Diagnosis not present

## 2013-12-19 DIAGNOSIS — J40 Bronchitis, not specified as acute or chronic: Secondary | ICD-10-CM | POA: Diagnosis not present

## 2013-12-27 DIAGNOSIS — J309 Allergic rhinitis, unspecified: Secondary | ICD-10-CM | POA: Diagnosis not present

## 2014-01-11 DIAGNOSIS — J309 Allergic rhinitis, unspecified: Secondary | ICD-10-CM | POA: Diagnosis not present

## 2014-01-23 DIAGNOSIS — J309 Allergic rhinitis, unspecified: Secondary | ICD-10-CM | POA: Diagnosis not present

## 2014-02-02 DIAGNOSIS — J309 Allergic rhinitis, unspecified: Secondary | ICD-10-CM | POA: Diagnosis not present

## 2014-02-08 DIAGNOSIS — J309 Allergic rhinitis, unspecified: Secondary | ICD-10-CM | POA: Diagnosis not present

## 2014-02-13 DIAGNOSIS — J309 Allergic rhinitis, unspecified: Secondary | ICD-10-CM | POA: Diagnosis not present

## 2014-02-14 DIAGNOSIS — J309 Allergic rhinitis, unspecified: Secondary | ICD-10-CM | POA: Diagnosis not present

## 2014-02-21 DIAGNOSIS — J309 Allergic rhinitis, unspecified: Secondary | ICD-10-CM | POA: Diagnosis not present

## 2014-02-28 DIAGNOSIS — J309 Allergic rhinitis, unspecified: Secondary | ICD-10-CM | POA: Diagnosis not present

## 2014-03-07 DIAGNOSIS — J309 Allergic rhinitis, unspecified: Secondary | ICD-10-CM | POA: Diagnosis not present

## 2014-03-09 DIAGNOSIS — J309 Allergic rhinitis, unspecified: Secondary | ICD-10-CM | POA: Diagnosis not present

## 2014-03-09 DIAGNOSIS — L600 Ingrowing nail: Secondary | ICD-10-CM | POA: Diagnosis not present

## 2014-03-14 DIAGNOSIS — J309 Allergic rhinitis, unspecified: Secondary | ICD-10-CM | POA: Diagnosis not present

## 2014-03-16 DIAGNOSIS — J309 Allergic rhinitis, unspecified: Secondary | ICD-10-CM | POA: Diagnosis not present

## 2014-03-23 DIAGNOSIS — J309 Allergic rhinitis, unspecified: Secondary | ICD-10-CM | POA: Diagnosis not present

## 2014-03-28 DIAGNOSIS — J309 Allergic rhinitis, unspecified: Secondary | ICD-10-CM | POA: Diagnosis not present

## 2014-04-06 DIAGNOSIS — J301 Allergic rhinitis due to pollen: Secondary | ICD-10-CM | POA: Diagnosis not present

## 2014-04-06 DIAGNOSIS — J3081 Allergic rhinitis due to animal (cat) (dog) hair and dander: Secondary | ICD-10-CM | POA: Diagnosis not present

## 2014-04-06 DIAGNOSIS — H1045 Other chronic allergic conjunctivitis: Secondary | ICD-10-CM | POA: Diagnosis not present

## 2014-04-06 DIAGNOSIS — J309 Allergic rhinitis, unspecified: Secondary | ICD-10-CM | POA: Diagnosis not present

## 2014-04-06 DIAGNOSIS — J45909 Unspecified asthma, uncomplicated: Secondary | ICD-10-CM | POA: Diagnosis not present

## 2014-04-14 DIAGNOSIS — J309 Allergic rhinitis, unspecified: Secondary | ICD-10-CM | POA: Diagnosis not present

## 2014-04-19 DIAGNOSIS — J309 Allergic rhinitis, unspecified: Secondary | ICD-10-CM | POA: Diagnosis not present

## 2014-04-24 DIAGNOSIS — Z124 Encounter for screening for malignant neoplasm of cervix: Secondary | ICD-10-CM | POA: Diagnosis not present

## 2014-04-24 DIAGNOSIS — J309 Allergic rhinitis, unspecified: Secondary | ICD-10-CM | POA: Diagnosis not present

## 2014-05-02 DIAGNOSIS — J301 Allergic rhinitis due to pollen: Secondary | ICD-10-CM | POA: Diagnosis not present

## 2014-05-02 DIAGNOSIS — H1045 Other chronic allergic conjunctivitis: Secondary | ICD-10-CM | POA: Diagnosis not present

## 2014-05-02 DIAGNOSIS — J45909 Unspecified asthma, uncomplicated: Secondary | ICD-10-CM | POA: Diagnosis not present

## 2014-05-02 DIAGNOSIS — J3081 Allergic rhinitis due to animal (cat) (dog) hair and dander: Secondary | ICD-10-CM | POA: Diagnosis not present

## 2014-05-09 DIAGNOSIS — J309 Allergic rhinitis, unspecified: Secondary | ICD-10-CM | POA: Diagnosis not present

## 2014-05-17 DIAGNOSIS — J309 Allergic rhinitis, unspecified: Secondary | ICD-10-CM | POA: Diagnosis not present

## 2014-05-23 DIAGNOSIS — J45909 Unspecified asthma, uncomplicated: Secondary | ICD-10-CM | POA: Diagnosis not present

## 2014-05-23 DIAGNOSIS — T148 Other injury of unspecified body region: Secondary | ICD-10-CM | POA: Diagnosis not present

## 2014-06-08 DIAGNOSIS — E7800 Pure hypercholesterolemia, unspecified: Secondary | ICD-10-CM | POA: Diagnosis not present

## 2014-06-08 DIAGNOSIS — I10 Essential (primary) hypertension: Secondary | ICD-10-CM | POA: Diagnosis not present

## 2014-06-08 DIAGNOSIS — J309 Allergic rhinitis, unspecified: Secondary | ICD-10-CM | POA: Diagnosis not present

## 2014-06-08 DIAGNOSIS — E559 Vitamin D deficiency, unspecified: Secondary | ICD-10-CM | POA: Diagnosis not present

## 2014-06-13 DIAGNOSIS — E039 Hypothyroidism, unspecified: Secondary | ICD-10-CM | POA: Diagnosis not present

## 2014-06-13 DIAGNOSIS — R5381 Other malaise: Secondary | ICD-10-CM | POA: Diagnosis not present

## 2014-06-13 DIAGNOSIS — M949 Disorder of cartilage, unspecified: Secondary | ICD-10-CM | POA: Diagnosis not present

## 2014-06-13 DIAGNOSIS — J309 Allergic rhinitis, unspecified: Secondary | ICD-10-CM | POA: Diagnosis not present

## 2014-06-13 DIAGNOSIS — R5383 Other fatigue: Secondary | ICD-10-CM | POA: Diagnosis not present

## 2014-06-13 DIAGNOSIS — Z0000 Encounter for general adult medical examination without abnormal findings: Secondary | ICD-10-CM | POA: Diagnosis not present

## 2014-06-13 DIAGNOSIS — M899 Disorder of bone, unspecified: Secondary | ICD-10-CM | POA: Diagnosis not present

## 2014-06-13 DIAGNOSIS — I10 Essential (primary) hypertension: Secondary | ICD-10-CM | POA: Diagnosis not present

## 2014-06-21 DIAGNOSIS — J309 Allergic rhinitis, unspecified: Secondary | ICD-10-CM | POA: Diagnosis not present

## 2014-06-29 DIAGNOSIS — J309 Allergic rhinitis, unspecified: Secondary | ICD-10-CM | POA: Diagnosis not present

## 2014-07-07 DIAGNOSIS — J309 Allergic rhinitis, unspecified: Secondary | ICD-10-CM | POA: Diagnosis not present

## 2014-07-12 DIAGNOSIS — J309 Allergic rhinitis, unspecified: Secondary | ICD-10-CM | POA: Diagnosis not present

## 2014-07-20 DIAGNOSIS — J309 Allergic rhinitis, unspecified: Secondary | ICD-10-CM | POA: Diagnosis not present

## 2014-07-26 DIAGNOSIS — J309 Allergic rhinitis, unspecified: Secondary | ICD-10-CM | POA: Diagnosis not present

## 2014-08-02 DIAGNOSIS — J301 Allergic rhinitis due to pollen: Secondary | ICD-10-CM | POA: Diagnosis not present

## 2014-08-02 DIAGNOSIS — J3081 Allergic rhinitis due to animal (cat) (dog) hair and dander: Secondary | ICD-10-CM | POA: Diagnosis not present

## 2014-08-02 DIAGNOSIS — H1045 Other chronic allergic conjunctivitis: Secondary | ICD-10-CM | POA: Diagnosis not present

## 2014-08-02 DIAGNOSIS — J45909 Unspecified asthma, uncomplicated: Secondary | ICD-10-CM | POA: Diagnosis not present

## 2014-08-23 DIAGNOSIS — Z23 Encounter for immunization: Secondary | ICD-10-CM | POA: Diagnosis not present

## 2014-08-31 DIAGNOSIS — J9801 Acute bronchospasm: Secondary | ICD-10-CM | POA: Diagnosis not present

## 2014-08-31 DIAGNOSIS — R0600 Dyspnea, unspecified: Secondary | ICD-10-CM | POA: Diagnosis not present

## 2014-09-05 DIAGNOSIS — J3089 Other allergic rhinitis: Secondary | ICD-10-CM | POA: Diagnosis not present

## 2014-09-05 DIAGNOSIS — J3081 Allergic rhinitis due to animal (cat) (dog) hair and dander: Secondary | ICD-10-CM | POA: Diagnosis not present

## 2014-09-07 DIAGNOSIS — J3081 Allergic rhinitis due to animal (cat) (dog) hair and dander: Secondary | ICD-10-CM | POA: Diagnosis not present

## 2014-09-07 DIAGNOSIS — J3089 Other allergic rhinitis: Secondary | ICD-10-CM | POA: Diagnosis not present

## 2014-09-13 DIAGNOSIS — J3089 Other allergic rhinitis: Secondary | ICD-10-CM | POA: Diagnosis not present

## 2014-09-13 DIAGNOSIS — J3081 Allergic rhinitis due to animal (cat) (dog) hair and dander: Secondary | ICD-10-CM | POA: Diagnosis not present

## 2014-09-15 DIAGNOSIS — J3081 Allergic rhinitis due to animal (cat) (dog) hair and dander: Secondary | ICD-10-CM | POA: Diagnosis not present

## 2014-09-15 DIAGNOSIS — J3089 Other allergic rhinitis: Secondary | ICD-10-CM | POA: Diagnosis not present

## 2014-09-20 DIAGNOSIS — J301 Allergic rhinitis due to pollen: Secondary | ICD-10-CM | POA: Diagnosis not present

## 2014-09-20 DIAGNOSIS — J3089 Other allergic rhinitis: Secondary | ICD-10-CM | POA: Diagnosis not present

## 2014-09-22 DIAGNOSIS — H5203 Hypermetropia, bilateral: Secondary | ICD-10-CM | POA: Diagnosis not present

## 2014-09-22 DIAGNOSIS — H52223 Regular astigmatism, bilateral: Secondary | ICD-10-CM | POA: Diagnosis not present

## 2014-09-22 DIAGNOSIS — H1045 Other chronic allergic conjunctivitis: Secondary | ICD-10-CM | POA: Diagnosis not present

## 2014-09-22 DIAGNOSIS — H2513 Age-related nuclear cataract, bilateral: Secondary | ICD-10-CM | POA: Diagnosis not present

## 2014-09-27 DIAGNOSIS — J3081 Allergic rhinitis due to animal (cat) (dog) hair and dander: Secondary | ICD-10-CM | POA: Diagnosis not present

## 2014-09-27 DIAGNOSIS — J301 Allergic rhinitis due to pollen: Secondary | ICD-10-CM | POA: Diagnosis not present

## 2014-09-29 DIAGNOSIS — J3089 Other allergic rhinitis: Secondary | ICD-10-CM | POA: Diagnosis not present

## 2014-10-03 DIAGNOSIS — J3089 Other allergic rhinitis: Secondary | ICD-10-CM | POA: Diagnosis not present

## 2014-10-03 DIAGNOSIS — J3081 Allergic rhinitis due to animal (cat) (dog) hair and dander: Secondary | ICD-10-CM | POA: Diagnosis not present

## 2014-10-05 DIAGNOSIS — J3089 Other allergic rhinitis: Secondary | ICD-10-CM | POA: Diagnosis not present

## 2014-10-05 DIAGNOSIS — J3081 Allergic rhinitis due to animal (cat) (dog) hair and dander: Secondary | ICD-10-CM | POA: Diagnosis not present

## 2014-10-09 DIAGNOSIS — J3081 Allergic rhinitis due to animal (cat) (dog) hair and dander: Secondary | ICD-10-CM | POA: Diagnosis not present

## 2014-10-09 DIAGNOSIS — J3089 Other allergic rhinitis: Secondary | ICD-10-CM | POA: Diagnosis not present

## 2014-10-17 DIAGNOSIS — J3089 Other allergic rhinitis: Secondary | ICD-10-CM | POA: Diagnosis not present

## 2014-10-17 DIAGNOSIS — J3081 Allergic rhinitis due to animal (cat) (dog) hair and dander: Secondary | ICD-10-CM | POA: Diagnosis not present

## 2014-10-23 DIAGNOSIS — D239 Other benign neoplasm of skin, unspecified: Secondary | ICD-10-CM | POA: Diagnosis not present

## 2014-10-23 DIAGNOSIS — J3089 Other allergic rhinitis: Secondary | ICD-10-CM | POA: Diagnosis not present

## 2014-10-23 DIAGNOSIS — J3081 Allergic rhinitis due to animal (cat) (dog) hair and dander: Secondary | ICD-10-CM | POA: Diagnosis not present

## 2014-10-23 DIAGNOSIS — L570 Actinic keratosis: Secondary | ICD-10-CM | POA: Diagnosis not present

## 2014-10-30 DIAGNOSIS — J3081 Allergic rhinitis due to animal (cat) (dog) hair and dander: Secondary | ICD-10-CM | POA: Diagnosis not present

## 2014-10-30 DIAGNOSIS — J3089 Other allergic rhinitis: Secondary | ICD-10-CM | POA: Diagnosis not present

## 2014-11-06 DIAGNOSIS — J3089 Other allergic rhinitis: Secondary | ICD-10-CM | POA: Diagnosis not present

## 2014-11-06 DIAGNOSIS — J3081 Allergic rhinitis due to animal (cat) (dog) hair and dander: Secondary | ICD-10-CM | POA: Diagnosis not present

## 2014-11-14 DIAGNOSIS — J3089 Other allergic rhinitis: Secondary | ICD-10-CM | POA: Diagnosis not present

## 2014-11-14 DIAGNOSIS — J3081 Allergic rhinitis due to animal (cat) (dog) hair and dander: Secondary | ICD-10-CM | POA: Diagnosis not present

## 2014-11-22 DIAGNOSIS — J3081 Allergic rhinitis due to animal (cat) (dog) hair and dander: Secondary | ICD-10-CM | POA: Diagnosis not present

## 2014-11-22 DIAGNOSIS — J3089 Other allergic rhinitis: Secondary | ICD-10-CM | POA: Diagnosis not present

## 2014-11-27 ENCOUNTER — Other Ambulatory Visit: Payer: Self-pay

## 2014-11-27 DIAGNOSIS — Z1231 Encounter for screening mammogram for malignant neoplasm of breast: Secondary | ICD-10-CM

## 2014-11-29 DIAGNOSIS — J3081 Allergic rhinitis due to animal (cat) (dog) hair and dander: Secondary | ICD-10-CM | POA: Diagnosis not present

## 2014-11-29 DIAGNOSIS — J3089 Other allergic rhinitis: Secondary | ICD-10-CM | POA: Diagnosis not present

## 2014-12-07 ENCOUNTER — Ambulatory Visit: Payer: Medicare Other

## 2014-12-07 DIAGNOSIS — J3089 Other allergic rhinitis: Secondary | ICD-10-CM | POA: Diagnosis not present

## 2014-12-07 DIAGNOSIS — J3081 Allergic rhinitis due to animal (cat) (dog) hair and dander: Secondary | ICD-10-CM | POA: Diagnosis not present

## 2014-12-14 DIAGNOSIS — E780 Pure hypercholesterolemia: Secondary | ICD-10-CM | POA: Diagnosis not present

## 2014-12-14 DIAGNOSIS — M810 Age-related osteoporosis without current pathological fracture: Secondary | ICD-10-CM | POA: Diagnosis not present

## 2014-12-14 DIAGNOSIS — I10 Essential (primary) hypertension: Secondary | ICD-10-CM | POA: Diagnosis not present

## 2014-12-14 DIAGNOSIS — J3089 Other allergic rhinitis: Secondary | ICD-10-CM | POA: Diagnosis not present

## 2014-12-14 DIAGNOSIS — E039 Hypothyroidism, unspecified: Secondary | ICD-10-CM | POA: Diagnosis not present

## 2014-12-14 DIAGNOSIS — J3081 Allergic rhinitis due to animal (cat) (dog) hair and dander: Secondary | ICD-10-CM | POA: Diagnosis not present

## 2014-12-22 ENCOUNTER — Ambulatory Visit
Admission: RE | Admit: 2014-12-22 | Discharge: 2014-12-22 | Disposition: A | Discharge status: Home / Self Care | Payer: Medicare Other | Source: Ambulatory Visit

## 2014-12-22 DIAGNOSIS — Z1231 Encounter for screening mammogram for malignant neoplasm of breast: Secondary | ICD-10-CM | POA: Diagnosis not present

## 2014-12-22 DIAGNOSIS — J3089 Other allergic rhinitis: Secondary | ICD-10-CM | POA: Diagnosis not present

## 2014-12-22 DIAGNOSIS — J3081 Allergic rhinitis due to animal (cat) (dog) hair and dander: Secondary | ICD-10-CM | POA: Diagnosis not present

## 2014-12-25 DIAGNOSIS — J3089 Other allergic rhinitis: Secondary | ICD-10-CM | POA: Diagnosis not present

## 2014-12-25 DIAGNOSIS — J3081 Allergic rhinitis due to animal (cat) (dog) hair and dander: Secondary | ICD-10-CM | POA: Diagnosis not present

## 2014-12-25 DIAGNOSIS — Z0000 Encounter for general adult medical examination without abnormal findings: Secondary | ICD-10-CM | POA: Diagnosis not present

## 2014-12-25 DIAGNOSIS — E039 Hypothyroidism, unspecified: Secondary | ICD-10-CM | POA: Diagnosis not present

## 2014-12-25 DIAGNOSIS — I10 Essential (primary) hypertension: Secondary | ICD-10-CM | POA: Diagnosis not present

## 2014-12-25 DIAGNOSIS — Z1389 Encounter for screening for other disorder: Secondary | ICD-10-CM | POA: Diagnosis not present

## 2015-01-05 DIAGNOSIS — J3089 Other allergic rhinitis: Secondary | ICD-10-CM | POA: Diagnosis not present

## 2015-01-05 DIAGNOSIS — J3081 Allergic rhinitis due to animal (cat) (dog) hair and dander: Secondary | ICD-10-CM | POA: Diagnosis not present

## 2015-01-11 DIAGNOSIS — J3081 Allergic rhinitis due to animal (cat) (dog) hair and dander: Secondary | ICD-10-CM | POA: Diagnosis not present

## 2015-01-11 DIAGNOSIS — J3089 Other allergic rhinitis: Secondary | ICD-10-CM | POA: Diagnosis not present

## 2015-01-17 DIAGNOSIS — J3089 Other allergic rhinitis: Secondary | ICD-10-CM | POA: Diagnosis not present

## 2015-01-17 DIAGNOSIS — J3081 Allergic rhinitis due to animal (cat) (dog) hair and dander: Secondary | ICD-10-CM | POA: Diagnosis not present

## 2015-01-22 DIAGNOSIS — J3081 Allergic rhinitis due to animal (cat) (dog) hair and dander: Secondary | ICD-10-CM | POA: Diagnosis not present

## 2015-01-22 DIAGNOSIS — J3089 Other allergic rhinitis: Secondary | ICD-10-CM | POA: Diagnosis not present

## 2015-01-31 DIAGNOSIS — J3081 Allergic rhinitis due to animal (cat) (dog) hair and dander: Secondary | ICD-10-CM | POA: Diagnosis not present

## 2015-01-31 DIAGNOSIS — J3089 Other allergic rhinitis: Secondary | ICD-10-CM | POA: Diagnosis not present

## 2015-02-02 DIAGNOSIS — J3089 Other allergic rhinitis: Secondary | ICD-10-CM | POA: Diagnosis not present

## 2015-02-02 DIAGNOSIS — J3081 Allergic rhinitis due to animal (cat) (dog) hair and dander: Secondary | ICD-10-CM | POA: Diagnosis not present

## 2015-02-07 DIAGNOSIS — J3089 Other allergic rhinitis: Secondary | ICD-10-CM | POA: Diagnosis not present

## 2015-02-07 DIAGNOSIS — J3081 Allergic rhinitis due to animal (cat) (dog) hair and dander: Secondary | ICD-10-CM | POA: Diagnosis not present

## 2015-02-12 DIAGNOSIS — J3081 Allergic rhinitis due to animal (cat) (dog) hair and dander: Secondary | ICD-10-CM | POA: Diagnosis not present

## 2015-02-12 DIAGNOSIS — J3089 Other allergic rhinitis: Secondary | ICD-10-CM | POA: Diagnosis not present

## 2015-02-16 DIAGNOSIS — J3089 Other allergic rhinitis: Secondary | ICD-10-CM | POA: Diagnosis not present

## 2015-02-16 DIAGNOSIS — J3081 Allergic rhinitis due to animal (cat) (dog) hair and dander: Secondary | ICD-10-CM | POA: Diagnosis not present

## 2015-02-20 DIAGNOSIS — J3089 Other allergic rhinitis: Secondary | ICD-10-CM | POA: Diagnosis not present

## 2015-02-20 DIAGNOSIS — J3081 Allergic rhinitis due to animal (cat) (dog) hair and dander: Secondary | ICD-10-CM | POA: Diagnosis not present

## 2015-02-22 DIAGNOSIS — J3089 Other allergic rhinitis: Secondary | ICD-10-CM | POA: Diagnosis not present

## 2015-02-22 DIAGNOSIS — J3081 Allergic rhinitis due to animal (cat) (dog) hair and dander: Secondary | ICD-10-CM | POA: Diagnosis not present

## 2015-02-27 DIAGNOSIS — J3081 Allergic rhinitis due to animal (cat) (dog) hair and dander: Secondary | ICD-10-CM | POA: Diagnosis not present

## 2015-02-27 DIAGNOSIS — J3089 Other allergic rhinitis: Secondary | ICD-10-CM | POA: Diagnosis not present

## 2015-03-08 DIAGNOSIS — J3089 Other allergic rhinitis: Secondary | ICD-10-CM | POA: Diagnosis not present

## 2015-03-08 DIAGNOSIS — J3081 Allergic rhinitis due to animal (cat) (dog) hair and dander: Secondary | ICD-10-CM | POA: Diagnosis not present

## 2015-03-12 DIAGNOSIS — J3081 Allergic rhinitis due to animal (cat) (dog) hair and dander: Secondary | ICD-10-CM | POA: Diagnosis not present

## 2015-03-12 DIAGNOSIS — J3089 Other allergic rhinitis: Secondary | ICD-10-CM | POA: Diagnosis not present

## 2015-03-23 DIAGNOSIS — J3089 Other allergic rhinitis: Secondary | ICD-10-CM | POA: Diagnosis not present

## 2015-03-23 DIAGNOSIS — J3081 Allergic rhinitis due to animal (cat) (dog) hair and dander: Secondary | ICD-10-CM | POA: Diagnosis not present

## 2015-03-29 DIAGNOSIS — J3081 Allergic rhinitis due to animal (cat) (dog) hair and dander: Secondary | ICD-10-CM | POA: Diagnosis not present

## 2015-03-29 DIAGNOSIS — J3089 Other allergic rhinitis: Secondary | ICD-10-CM | POA: Diagnosis not present

## 2015-04-05 DIAGNOSIS — J301 Allergic rhinitis due to pollen: Secondary | ICD-10-CM | POA: Diagnosis not present

## 2015-04-05 DIAGNOSIS — J3089 Other allergic rhinitis: Secondary | ICD-10-CM | POA: Diagnosis not present

## 2015-04-11 DIAGNOSIS — J3081 Allergic rhinitis due to animal (cat) (dog) hair and dander: Secondary | ICD-10-CM | POA: Diagnosis not present

## 2015-04-11 DIAGNOSIS — J3089 Other allergic rhinitis: Secondary | ICD-10-CM | POA: Diagnosis not present

## 2015-04-18 DIAGNOSIS — J3089 Other allergic rhinitis: Secondary | ICD-10-CM | POA: Diagnosis not present

## 2015-04-18 DIAGNOSIS — J301 Allergic rhinitis due to pollen: Secondary | ICD-10-CM | POA: Diagnosis not present

## 2015-04-18 DIAGNOSIS — J3081 Allergic rhinitis due to animal (cat) (dog) hair and dander: Secondary | ICD-10-CM | POA: Diagnosis not present

## 2015-04-25 DIAGNOSIS — J3089 Other allergic rhinitis: Secondary | ICD-10-CM | POA: Diagnosis not present

## 2015-04-25 DIAGNOSIS — J3081 Allergic rhinitis due to animal (cat) (dog) hair and dander: Secondary | ICD-10-CM | POA: Diagnosis not present

## 2015-05-01 DIAGNOSIS — Z01419 Encounter for gynecological examination (general) (routine) without abnormal findings: Secondary | ICD-10-CM | POA: Diagnosis not present

## 2015-05-01 DIAGNOSIS — J301 Allergic rhinitis due to pollen: Secondary | ICD-10-CM | POA: Diagnosis not present

## 2015-05-01 DIAGNOSIS — Z6837 Body mass index (BMI) 37.0-37.9, adult: Secondary | ICD-10-CM | POA: Diagnosis not present

## 2015-05-01 DIAGNOSIS — J3089 Other allergic rhinitis: Secondary | ICD-10-CM | POA: Diagnosis not present

## 2015-05-09 DIAGNOSIS — J3081 Allergic rhinitis due to animal (cat) (dog) hair and dander: Secondary | ICD-10-CM | POA: Diagnosis not present

## 2015-05-09 DIAGNOSIS — J3089 Other allergic rhinitis: Secondary | ICD-10-CM | POA: Diagnosis not present

## 2015-05-16 DIAGNOSIS — J3089 Other allergic rhinitis: Secondary | ICD-10-CM | POA: Diagnosis not present

## 2015-05-25 DIAGNOSIS — J3089 Other allergic rhinitis: Secondary | ICD-10-CM | POA: Diagnosis not present

## 2015-05-25 DIAGNOSIS — J3081 Allergic rhinitis due to animal (cat) (dog) hair and dander: Secondary | ICD-10-CM | POA: Diagnosis not present

## 2015-06-01 DIAGNOSIS — J3081 Allergic rhinitis due to animal (cat) (dog) hair and dander: Secondary | ICD-10-CM | POA: Diagnosis not present

## 2015-06-01 DIAGNOSIS — J3089 Other allergic rhinitis: Secondary | ICD-10-CM | POA: Diagnosis not present

## 2015-06-11 DIAGNOSIS — J3089 Other allergic rhinitis: Secondary | ICD-10-CM | POA: Diagnosis not present

## 2015-06-11 DIAGNOSIS — J3081 Allergic rhinitis due to animal (cat) (dog) hair and dander: Secondary | ICD-10-CM | POA: Diagnosis not present

## 2015-06-13 DIAGNOSIS — J3089 Other allergic rhinitis: Secondary | ICD-10-CM | POA: Diagnosis not present

## 2015-06-13 DIAGNOSIS — J3081 Allergic rhinitis due to animal (cat) (dog) hair and dander: Secondary | ICD-10-CM | POA: Diagnosis not present

## 2015-06-18 DIAGNOSIS — I10 Essential (primary) hypertension: Secondary | ICD-10-CM | POA: Diagnosis not present

## 2015-06-18 DIAGNOSIS — J3081 Allergic rhinitis due to animal (cat) (dog) hair and dander: Secondary | ICD-10-CM | POA: Diagnosis not present

## 2015-06-18 DIAGNOSIS — J3089 Other allergic rhinitis: Secondary | ICD-10-CM | POA: Diagnosis not present

## 2015-06-27 DIAGNOSIS — J3081 Allergic rhinitis due to animal (cat) (dog) hair and dander: Secondary | ICD-10-CM | POA: Diagnosis not present

## 2015-06-27 DIAGNOSIS — J3089 Other allergic rhinitis: Secondary | ICD-10-CM | POA: Diagnosis not present

## 2015-07-09 DIAGNOSIS — E039 Hypothyroidism, unspecified: Secondary | ICD-10-CM | POA: Diagnosis not present

## 2015-07-09 DIAGNOSIS — J3089 Other allergic rhinitis: Secondary | ICD-10-CM | POA: Diagnosis not present

## 2015-07-09 DIAGNOSIS — E559 Vitamin D deficiency, unspecified: Secondary | ICD-10-CM | POA: Diagnosis not present

## 2015-07-09 DIAGNOSIS — I10 Essential (primary) hypertension: Secondary | ICD-10-CM | POA: Diagnosis not present

## 2015-07-09 DIAGNOSIS — J3081 Allergic rhinitis due to animal (cat) (dog) hair and dander: Secondary | ICD-10-CM | POA: Diagnosis not present

## 2015-07-09 DIAGNOSIS — E780 Pure hypercholesterolemia: Secondary | ICD-10-CM | POA: Diagnosis not present

## 2015-07-16 DIAGNOSIS — J3089 Other allergic rhinitis: Secondary | ICD-10-CM | POA: Diagnosis not present

## 2015-07-16 DIAGNOSIS — J3081 Allergic rhinitis due to animal (cat) (dog) hair and dander: Secondary | ICD-10-CM | POA: Diagnosis not present

## 2015-07-19 DIAGNOSIS — J3081 Allergic rhinitis due to animal (cat) (dog) hair and dander: Secondary | ICD-10-CM | POA: Diagnosis not present

## 2015-07-19 DIAGNOSIS — J3089 Other allergic rhinitis: Secondary | ICD-10-CM | POA: Diagnosis not present

## 2015-07-24 DIAGNOSIS — J3081 Allergic rhinitis due to animal (cat) (dog) hair and dander: Secondary | ICD-10-CM | POA: Diagnosis not present

## 2015-07-24 DIAGNOSIS — J3089 Other allergic rhinitis: Secondary | ICD-10-CM | POA: Diagnosis not present

## 2015-07-26 DIAGNOSIS — J3081 Allergic rhinitis due to animal (cat) (dog) hair and dander: Secondary | ICD-10-CM | POA: Diagnosis not present

## 2015-07-26 DIAGNOSIS — J3089 Other allergic rhinitis: Secondary | ICD-10-CM | POA: Diagnosis not present

## 2015-08-01 DIAGNOSIS — J3081 Allergic rhinitis due to animal (cat) (dog) hair and dander: Secondary | ICD-10-CM | POA: Diagnosis not present

## 2015-08-01 DIAGNOSIS — J4540 Moderate persistent asthma, uncomplicated: Secondary | ICD-10-CM | POA: Diagnosis not present

## 2015-08-01 DIAGNOSIS — J301 Allergic rhinitis due to pollen: Secondary | ICD-10-CM | POA: Diagnosis not present

## 2015-08-01 DIAGNOSIS — J3089 Other allergic rhinitis: Secondary | ICD-10-CM | POA: Diagnosis not present

## 2015-08-01 DIAGNOSIS — H1045 Other chronic allergic conjunctivitis: Secondary | ICD-10-CM | POA: Diagnosis not present

## 2015-08-15 DIAGNOSIS — J3089 Other allergic rhinitis: Secondary | ICD-10-CM | POA: Diagnosis not present

## 2015-08-15 DIAGNOSIS — J3081 Allergic rhinitis due to animal (cat) (dog) hair and dander: Secondary | ICD-10-CM | POA: Diagnosis not present

## 2015-08-21 DIAGNOSIS — J3081 Allergic rhinitis due to animal (cat) (dog) hair and dander: Secondary | ICD-10-CM | POA: Diagnosis not present

## 2015-08-21 DIAGNOSIS — J3089 Other allergic rhinitis: Secondary | ICD-10-CM | POA: Diagnosis not present

## 2015-08-30 DIAGNOSIS — J3089 Other allergic rhinitis: Secondary | ICD-10-CM | POA: Diagnosis not present

## 2015-08-30 DIAGNOSIS — J3081 Allergic rhinitis due to animal (cat) (dog) hair and dander: Secondary | ICD-10-CM | POA: Diagnosis not present

## 2015-08-31 DIAGNOSIS — Z23 Encounter for immunization: Secondary | ICD-10-CM | POA: Diagnosis not present

## 2015-09-07 DIAGNOSIS — J3089 Other allergic rhinitis: Secondary | ICD-10-CM | POA: Diagnosis not present

## 2015-09-07 DIAGNOSIS — J3081 Allergic rhinitis due to animal (cat) (dog) hair and dander: Secondary | ICD-10-CM | POA: Diagnosis not present

## 2015-09-14 DIAGNOSIS — J3089 Other allergic rhinitis: Secondary | ICD-10-CM | POA: Diagnosis not present

## 2015-09-14 DIAGNOSIS — J3081 Allergic rhinitis due to animal (cat) (dog) hair and dander: Secondary | ICD-10-CM | POA: Diagnosis not present

## 2015-09-21 DIAGNOSIS — J3081 Allergic rhinitis due to animal (cat) (dog) hair and dander: Secondary | ICD-10-CM | POA: Diagnosis not present

## 2015-09-21 DIAGNOSIS — J3089 Other allergic rhinitis: Secondary | ICD-10-CM | POA: Diagnosis not present

## 2015-09-28 DIAGNOSIS — J3081 Allergic rhinitis due to animal (cat) (dog) hair and dander: Secondary | ICD-10-CM | POA: Diagnosis not present

## 2015-09-28 DIAGNOSIS — J3089 Other allergic rhinitis: Secondary | ICD-10-CM | POA: Diagnosis not present

## 2015-10-04 DIAGNOSIS — J3081 Allergic rhinitis due to animal (cat) (dog) hair and dander: Secondary | ICD-10-CM | POA: Diagnosis not present

## 2015-10-04 DIAGNOSIS — J3089 Other allergic rhinitis: Secondary | ICD-10-CM | POA: Diagnosis not present

## 2015-10-19 DIAGNOSIS — J3081 Allergic rhinitis due to animal (cat) (dog) hair and dander: Secondary | ICD-10-CM | POA: Diagnosis not present

## 2015-10-19 DIAGNOSIS — J3089 Other allergic rhinitis: Secondary | ICD-10-CM | POA: Diagnosis not present

## 2015-10-24 DIAGNOSIS — J3081 Allergic rhinitis due to animal (cat) (dog) hair and dander: Secondary | ICD-10-CM | POA: Diagnosis not present

## 2015-10-24 DIAGNOSIS — J3089 Other allergic rhinitis: Secondary | ICD-10-CM | POA: Diagnosis not present

## 2015-10-31 DIAGNOSIS — J3089 Other allergic rhinitis: Secondary | ICD-10-CM | POA: Diagnosis not present

## 2015-10-31 DIAGNOSIS — I10 Essential (primary) hypertension: Secondary | ICD-10-CM | POA: Diagnosis not present

## 2015-10-31 DIAGNOSIS — E785 Hyperlipidemia, unspecified: Secondary | ICD-10-CM | POA: Diagnosis not present

## 2015-10-31 DIAGNOSIS — J3081 Allergic rhinitis due to animal (cat) (dog) hair and dander: Secondary | ICD-10-CM | POA: Diagnosis not present

## 2015-11-06 DIAGNOSIS — I10 Essential (primary) hypertension: Secondary | ICD-10-CM | POA: Diagnosis not present

## 2015-11-06 DIAGNOSIS — J3081 Allergic rhinitis due to animal (cat) (dog) hair and dander: Secondary | ICD-10-CM | POA: Diagnosis not present

## 2015-11-06 DIAGNOSIS — E039 Hypothyroidism, unspecified: Secondary | ICD-10-CM | POA: Diagnosis not present

## 2015-11-06 DIAGNOSIS — J3089 Other allergic rhinitis: Secondary | ICD-10-CM | POA: Diagnosis not present

## 2015-11-15 DIAGNOSIS — J3081 Allergic rhinitis due to animal (cat) (dog) hair and dander: Secondary | ICD-10-CM | POA: Diagnosis not present

## 2015-11-15 DIAGNOSIS — J3089 Other allergic rhinitis: Secondary | ICD-10-CM | POA: Diagnosis not present

## 2015-11-21 DIAGNOSIS — J3089 Other allergic rhinitis: Secondary | ICD-10-CM | POA: Diagnosis not present

## 2015-11-21 DIAGNOSIS — J3081 Allergic rhinitis due to animal (cat) (dog) hair and dander: Secondary | ICD-10-CM | POA: Diagnosis not present

## 2015-11-27 DIAGNOSIS — J3081 Allergic rhinitis due to animal (cat) (dog) hair and dander: Secondary | ICD-10-CM | POA: Diagnosis not present

## 2015-11-27 DIAGNOSIS — J3089 Other allergic rhinitis: Secondary | ICD-10-CM | POA: Diagnosis not present

## 2015-12-03 DIAGNOSIS — J301 Allergic rhinitis due to pollen: Secondary | ICD-10-CM | POA: Diagnosis not present

## 2015-12-03 DIAGNOSIS — J3089 Other allergic rhinitis: Secondary | ICD-10-CM | POA: Diagnosis not present

## 2015-12-12 DIAGNOSIS — J3089 Other allergic rhinitis: Secondary | ICD-10-CM | POA: Diagnosis not present

## 2015-12-12 DIAGNOSIS — J3081 Allergic rhinitis due to animal (cat) (dog) hair and dander: Secondary | ICD-10-CM | POA: Diagnosis not present

## 2015-12-20 DIAGNOSIS — J3081 Allergic rhinitis due to animal (cat) (dog) hair and dander: Secondary | ICD-10-CM | POA: Diagnosis not present

## 2015-12-20 DIAGNOSIS — J3089 Other allergic rhinitis: Secondary | ICD-10-CM | POA: Diagnosis not present

## 2015-12-25 ENCOUNTER — Other Ambulatory Visit: Payer: Self-pay

## 2015-12-25 DIAGNOSIS — Z1231 Encounter for screening mammogram for malignant neoplasm of breast: Secondary | ICD-10-CM

## 2015-12-27 DIAGNOSIS — J3081 Allergic rhinitis due to animal (cat) (dog) hair and dander: Secondary | ICD-10-CM | POA: Diagnosis not present

## 2015-12-27 DIAGNOSIS — J3089 Other allergic rhinitis: Secondary | ICD-10-CM | POA: Diagnosis not present

## 2016-01-03 DIAGNOSIS — J3089 Other allergic rhinitis: Secondary | ICD-10-CM | POA: Diagnosis not present

## 2016-01-03 DIAGNOSIS — J3081 Allergic rhinitis due to animal (cat) (dog) hair and dander: Secondary | ICD-10-CM | POA: Diagnosis not present

## 2016-01-07 ENCOUNTER — Ambulatory Visit: Payer: No Typology Code available for payment source

## 2016-01-11 ENCOUNTER — Ambulatory Visit
Admission: RE | Admit: 2016-01-11 | Discharge: 2016-01-11 | Disposition: A | Discharge status: Home / Self Care | Payer: Medicare Other | Source: Ambulatory Visit

## 2016-01-11 DIAGNOSIS — J3089 Other allergic rhinitis: Secondary | ICD-10-CM | POA: Diagnosis not present

## 2016-01-11 DIAGNOSIS — Z1231 Encounter for screening mammogram for malignant neoplasm of breast: Secondary | ICD-10-CM | POA: Diagnosis not present

## 2016-01-11 DIAGNOSIS — J3081 Allergic rhinitis due to animal (cat) (dog) hair and dander: Secondary | ICD-10-CM | POA: Diagnosis not present

## 2016-01-14 DIAGNOSIS — J3081 Allergic rhinitis due to animal (cat) (dog) hair and dander: Secondary | ICD-10-CM | POA: Diagnosis not present

## 2016-01-14 DIAGNOSIS — J3089 Other allergic rhinitis: Secondary | ICD-10-CM | POA: Diagnosis not present

## 2016-01-18 DIAGNOSIS — J3081 Allergic rhinitis due to animal (cat) (dog) hair and dander: Secondary | ICD-10-CM | POA: Diagnosis not present

## 2016-01-18 DIAGNOSIS — J3089 Other allergic rhinitis: Secondary | ICD-10-CM | POA: Diagnosis not present

## 2016-01-24 DIAGNOSIS — J3081 Allergic rhinitis due to animal (cat) (dog) hair and dander: Secondary | ICD-10-CM | POA: Diagnosis not present

## 2016-01-24 DIAGNOSIS — J3089 Other allergic rhinitis: Secondary | ICD-10-CM | POA: Diagnosis not present

## 2016-01-29 DIAGNOSIS — J3089 Other allergic rhinitis: Secondary | ICD-10-CM | POA: Diagnosis not present

## 2016-01-29 DIAGNOSIS — J3081 Allergic rhinitis due to animal (cat) (dog) hair and dander: Secondary | ICD-10-CM | POA: Diagnosis not present

## 2016-02-04 ENCOUNTER — Other Ambulatory Visit: Payer: Self-pay | Admitting: Dermatology

## 2016-02-04 DIAGNOSIS — D0439 Carcinoma in situ of skin of other parts of face: Secondary | ICD-10-CM | POA: Diagnosis not present

## 2016-02-04 DIAGNOSIS — J3089 Other allergic rhinitis: Secondary | ICD-10-CM | POA: Diagnosis not present

## 2016-02-04 DIAGNOSIS — L570 Actinic keratosis: Secondary | ICD-10-CM | POA: Diagnosis not present

## 2016-02-04 DIAGNOSIS — J3081 Allergic rhinitis due to animal (cat) (dog) hair and dander: Secondary | ICD-10-CM | POA: Diagnosis not present

## 2016-02-04 DIAGNOSIS — C44319 Basal cell carcinoma of skin of other parts of face: Secondary | ICD-10-CM | POA: Diagnosis not present

## 2016-02-13 DIAGNOSIS — J3089 Other allergic rhinitis: Secondary | ICD-10-CM | POA: Diagnosis not present

## 2016-02-13 DIAGNOSIS — J3081 Allergic rhinitis due to animal (cat) (dog) hair and dander: Secondary | ICD-10-CM | POA: Diagnosis not present

## 2016-02-19 DIAGNOSIS — J3081 Allergic rhinitis due to animal (cat) (dog) hair and dander: Secondary | ICD-10-CM | POA: Diagnosis not present

## 2016-02-19 DIAGNOSIS — J3089 Other allergic rhinitis: Secondary | ICD-10-CM | POA: Diagnosis not present

## 2016-02-27 DIAGNOSIS — J3089 Other allergic rhinitis: Secondary | ICD-10-CM | POA: Diagnosis not present

## 2016-02-27 DIAGNOSIS — J3081 Allergic rhinitis due to animal (cat) (dog) hair and dander: Secondary | ICD-10-CM | POA: Diagnosis not present

## 2016-03-03 DIAGNOSIS — J3081 Allergic rhinitis due to animal (cat) (dog) hair and dander: Secondary | ICD-10-CM | POA: Diagnosis not present

## 2016-03-03 DIAGNOSIS — J3089 Other allergic rhinitis: Secondary | ICD-10-CM | POA: Diagnosis not present

## 2016-03-12 DIAGNOSIS — J3089 Other allergic rhinitis: Secondary | ICD-10-CM | POA: Diagnosis not present

## 2016-03-12 DIAGNOSIS — J3081 Allergic rhinitis due to animal (cat) (dog) hair and dander: Secondary | ICD-10-CM | POA: Diagnosis not present

## 2016-03-18 DIAGNOSIS — J3089 Other allergic rhinitis: Secondary | ICD-10-CM | POA: Diagnosis not present

## 2016-03-18 DIAGNOSIS — J3081 Allergic rhinitis due to animal (cat) (dog) hair and dander: Secondary | ICD-10-CM | POA: Diagnosis not present

## 2016-03-26 DIAGNOSIS — J3089 Other allergic rhinitis: Secondary | ICD-10-CM | POA: Diagnosis not present

## 2016-03-26 DIAGNOSIS — J3081 Allergic rhinitis due to animal (cat) (dog) hair and dander: Secondary | ICD-10-CM | POA: Diagnosis not present

## 2016-04-03 DIAGNOSIS — J3089 Other allergic rhinitis: Secondary | ICD-10-CM | POA: Diagnosis not present

## 2016-04-03 DIAGNOSIS — J301 Allergic rhinitis due to pollen: Secondary | ICD-10-CM | POA: Diagnosis not present

## 2016-04-09 DIAGNOSIS — J3089 Other allergic rhinitis: Secondary | ICD-10-CM | POA: Diagnosis not present

## 2016-04-09 DIAGNOSIS — J3081 Allergic rhinitis due to animal (cat) (dog) hair and dander: Secondary | ICD-10-CM | POA: Diagnosis not present

## 2016-04-17 DIAGNOSIS — J3081 Allergic rhinitis due to animal (cat) (dog) hair and dander: Secondary | ICD-10-CM | POA: Diagnosis not present

## 2016-04-17 DIAGNOSIS — J3089 Other allergic rhinitis: Secondary | ICD-10-CM | POA: Diagnosis not present

## 2016-04-23 DIAGNOSIS — J3081 Allergic rhinitis due to animal (cat) (dog) hair and dander: Secondary | ICD-10-CM | POA: Diagnosis not present

## 2016-04-23 DIAGNOSIS — J3089 Other allergic rhinitis: Secondary | ICD-10-CM | POA: Diagnosis not present

## 2016-04-28 DIAGNOSIS — J3081 Allergic rhinitis due to animal (cat) (dog) hair and dander: Secondary | ICD-10-CM | POA: Diagnosis not present

## 2016-04-28 DIAGNOSIS — J3089 Other allergic rhinitis: Secondary | ICD-10-CM | POA: Diagnosis not present

## 2016-05-01 DIAGNOSIS — J3081 Allergic rhinitis due to animal (cat) (dog) hair and dander: Secondary | ICD-10-CM | POA: Diagnosis not present

## 2016-05-01 DIAGNOSIS — J3089 Other allergic rhinitis: Secondary | ICD-10-CM | POA: Diagnosis not present

## 2016-05-08 DIAGNOSIS — J3089 Other allergic rhinitis: Secondary | ICD-10-CM | POA: Diagnosis not present

## 2016-05-08 DIAGNOSIS — J3081 Allergic rhinitis due to animal (cat) (dog) hair and dander: Secondary | ICD-10-CM | POA: Diagnosis not present

## 2016-05-08 DIAGNOSIS — C44319 Basal cell carcinoma of skin of other parts of face: Secondary | ICD-10-CM | POA: Diagnosis not present

## 2016-05-08 DIAGNOSIS — D0439 Carcinoma in situ of skin of other parts of face: Secondary | ICD-10-CM | POA: Diagnosis not present

## 2016-05-12 DIAGNOSIS — L03032 Cellulitis of left toe: Secondary | ICD-10-CM | POA: Diagnosis not present

## 2016-05-12 DIAGNOSIS — M79675 Pain in left toe(s): Secondary | ICD-10-CM | POA: Diagnosis not present

## 2016-05-15 DIAGNOSIS — J3081 Allergic rhinitis due to animal (cat) (dog) hair and dander: Secondary | ICD-10-CM | POA: Diagnosis not present

## 2016-05-15 DIAGNOSIS — J3089 Other allergic rhinitis: Secondary | ICD-10-CM | POA: Diagnosis not present

## 2016-05-22 DIAGNOSIS — J3081 Allergic rhinitis due to animal (cat) (dog) hair and dander: Secondary | ICD-10-CM | POA: Diagnosis not present

## 2016-05-22 DIAGNOSIS — J3089 Other allergic rhinitis: Secondary | ICD-10-CM | POA: Diagnosis not present

## 2016-05-28 DIAGNOSIS — J3089 Other allergic rhinitis: Secondary | ICD-10-CM | POA: Diagnosis not present

## 2016-05-28 DIAGNOSIS — J3081 Allergic rhinitis due to animal (cat) (dog) hair and dander: Secondary | ICD-10-CM | POA: Diagnosis not present

## 2016-06-03 DIAGNOSIS — J3081 Allergic rhinitis due to animal (cat) (dog) hair and dander: Secondary | ICD-10-CM | POA: Diagnosis not present

## 2016-06-03 DIAGNOSIS — J3089 Other allergic rhinitis: Secondary | ICD-10-CM | POA: Diagnosis not present

## 2016-06-06 DIAGNOSIS — J3089 Other allergic rhinitis: Secondary | ICD-10-CM | POA: Diagnosis not present

## 2016-06-06 DIAGNOSIS — J3081 Allergic rhinitis due to animal (cat) (dog) hair and dander: Secondary | ICD-10-CM | POA: Diagnosis not present

## 2016-06-10 DIAGNOSIS — I10 Essential (primary) hypertension: Secondary | ICD-10-CM | POA: Diagnosis not present

## 2016-06-10 DIAGNOSIS — E039 Hypothyroidism, unspecified: Secondary | ICD-10-CM | POA: Diagnosis not present

## 2016-06-10 DIAGNOSIS — J3081 Allergic rhinitis due to animal (cat) (dog) hair and dander: Secondary | ICD-10-CM | POA: Diagnosis not present

## 2016-06-10 DIAGNOSIS — J3089 Other allergic rhinitis: Secondary | ICD-10-CM | POA: Diagnosis not present

## 2016-06-13 DIAGNOSIS — J3089 Other allergic rhinitis: Secondary | ICD-10-CM | POA: Diagnosis not present

## 2016-06-13 DIAGNOSIS — J3081 Allergic rhinitis due to animal (cat) (dog) hair and dander: Secondary | ICD-10-CM | POA: Diagnosis not present

## 2016-06-17 DIAGNOSIS — I10 Essential (primary) hypertension: Secondary | ICD-10-CM | POA: Diagnosis not present

## 2016-06-17 DIAGNOSIS — E7800 Pure hypercholesterolemia, unspecified: Secondary | ICD-10-CM | POA: Diagnosis not present

## 2016-06-17 DIAGNOSIS — E039 Hypothyroidism, unspecified: Secondary | ICD-10-CM | POA: Diagnosis not present

## 2016-06-17 DIAGNOSIS — J3081 Allergic rhinitis due to animal (cat) (dog) hair and dander: Secondary | ICD-10-CM | POA: Diagnosis not present

## 2016-06-17 DIAGNOSIS — J3089 Other allergic rhinitis: Secondary | ICD-10-CM | POA: Diagnosis not present

## 2016-06-25 DIAGNOSIS — J3089 Other allergic rhinitis: Secondary | ICD-10-CM | POA: Diagnosis not present

## 2016-06-25 DIAGNOSIS — J3081 Allergic rhinitis due to animal (cat) (dog) hair and dander: Secondary | ICD-10-CM | POA: Diagnosis not present

## 2016-07-02 DIAGNOSIS — J3081 Allergic rhinitis due to animal (cat) (dog) hair and dander: Secondary | ICD-10-CM | POA: Diagnosis not present

## 2016-07-02 DIAGNOSIS — J3089 Other allergic rhinitis: Secondary | ICD-10-CM | POA: Diagnosis not present

## 2016-07-07 DIAGNOSIS — J3089 Other allergic rhinitis: Secondary | ICD-10-CM | POA: Diagnosis not present

## 2016-07-07 DIAGNOSIS — J3081 Allergic rhinitis due to animal (cat) (dog) hair and dander: Secondary | ICD-10-CM | POA: Diagnosis not present

## 2016-07-15 DIAGNOSIS — J3081 Allergic rhinitis due to animal (cat) (dog) hair and dander: Secondary | ICD-10-CM | POA: Diagnosis not present

## 2016-07-15 DIAGNOSIS — J3089 Other allergic rhinitis: Secondary | ICD-10-CM | POA: Diagnosis not present

## 2016-07-23 DIAGNOSIS — J3081 Allergic rhinitis due to animal (cat) (dog) hair and dander: Secondary | ICD-10-CM | POA: Diagnosis not present

## 2016-07-23 DIAGNOSIS — J3089 Other allergic rhinitis: Secondary | ICD-10-CM | POA: Diagnosis not present

## 2016-07-31 DIAGNOSIS — J301 Allergic rhinitis due to pollen: Secondary | ICD-10-CM | POA: Diagnosis not present

## 2016-07-31 DIAGNOSIS — J3089 Other allergic rhinitis: Secondary | ICD-10-CM | POA: Diagnosis not present

## 2016-07-31 DIAGNOSIS — J4540 Moderate persistent asthma, uncomplicated: Secondary | ICD-10-CM | POA: Diagnosis not present

## 2016-07-31 DIAGNOSIS — J3081 Allergic rhinitis due to animal (cat) (dog) hair and dander: Secondary | ICD-10-CM | POA: Diagnosis not present

## 2016-07-31 DIAGNOSIS — H1045 Other chronic allergic conjunctivitis: Secondary | ICD-10-CM | POA: Diagnosis not present

## 2016-08-05 DIAGNOSIS — J3081 Allergic rhinitis due to animal (cat) (dog) hair and dander: Secondary | ICD-10-CM | POA: Diagnosis not present

## 2016-08-05 DIAGNOSIS — J3089 Other allergic rhinitis: Secondary | ICD-10-CM | POA: Diagnosis not present

## 2016-08-13 DIAGNOSIS — J3081 Allergic rhinitis due to animal (cat) (dog) hair and dander: Secondary | ICD-10-CM | POA: Diagnosis not present

## 2016-08-13 DIAGNOSIS — J3089 Other allergic rhinitis: Secondary | ICD-10-CM | POA: Diagnosis not present

## 2016-08-18 DIAGNOSIS — J3089 Other allergic rhinitis: Secondary | ICD-10-CM | POA: Diagnosis not present

## 2016-08-18 DIAGNOSIS — J3081 Allergic rhinitis due to animal (cat) (dog) hair and dander: Secondary | ICD-10-CM | POA: Diagnosis not present

## 2016-08-25 DIAGNOSIS — Z23 Encounter for immunization: Secondary | ICD-10-CM | POA: Diagnosis not present

## 2016-08-27 DIAGNOSIS — J3089 Other allergic rhinitis: Secondary | ICD-10-CM | POA: Diagnosis not present

## 2016-08-27 DIAGNOSIS — J3081 Allergic rhinitis due to animal (cat) (dog) hair and dander: Secondary | ICD-10-CM | POA: Diagnosis not present

## 2016-09-02 DIAGNOSIS — J3081 Allergic rhinitis due to animal (cat) (dog) hair and dander: Secondary | ICD-10-CM | POA: Diagnosis not present

## 2016-09-02 DIAGNOSIS — J3089 Other allergic rhinitis: Secondary | ICD-10-CM | POA: Diagnosis not present

## 2016-09-10 DIAGNOSIS — J3089 Other allergic rhinitis: Secondary | ICD-10-CM | POA: Diagnosis not present

## 2016-09-10 DIAGNOSIS — J3081 Allergic rhinitis due to animal (cat) (dog) hair and dander: Secondary | ICD-10-CM | POA: Diagnosis not present

## 2016-09-17 DIAGNOSIS — J3081 Allergic rhinitis due to animal (cat) (dog) hair and dander: Secondary | ICD-10-CM | POA: Diagnosis not present

## 2016-09-17 DIAGNOSIS — J3089 Other allergic rhinitis: Secondary | ICD-10-CM | POA: Diagnosis not present

## 2016-09-24 DIAGNOSIS — J3089 Other allergic rhinitis: Secondary | ICD-10-CM | POA: Diagnosis not present

## 2016-09-24 DIAGNOSIS — J3081 Allergic rhinitis due to animal (cat) (dog) hair and dander: Secondary | ICD-10-CM | POA: Diagnosis not present

## 2016-09-29 DIAGNOSIS — J3081 Allergic rhinitis due to animal (cat) (dog) hair and dander: Secondary | ICD-10-CM | POA: Diagnosis not present

## 2016-09-29 DIAGNOSIS — J3089 Other allergic rhinitis: Secondary | ICD-10-CM | POA: Diagnosis not present

## 2016-10-01 DIAGNOSIS — J3089 Other allergic rhinitis: Secondary | ICD-10-CM | POA: Diagnosis not present

## 2016-10-01 DIAGNOSIS — J3081 Allergic rhinitis due to animal (cat) (dog) hair and dander: Secondary | ICD-10-CM | POA: Diagnosis not present

## 2016-10-13 ENCOUNTER — Other Ambulatory Visit: Payer: Self-pay | Admitting: Dermatology

## 2016-10-13 DIAGNOSIS — J3089 Other allergic rhinitis: Secondary | ICD-10-CM | POA: Diagnosis not present

## 2016-10-13 DIAGNOSIS — J3081 Allergic rhinitis due to animal (cat) (dog) hair and dander: Secondary | ICD-10-CM | POA: Diagnosis not present

## 2016-10-13 DIAGNOSIS — D492 Neoplasm of unspecified behavior of bone, soft tissue, and skin: Secondary | ICD-10-CM | POA: Diagnosis not present

## 2016-10-13 DIAGNOSIS — L820 Inflamed seborrheic keratosis: Secondary | ICD-10-CM | POA: Diagnosis not present

## 2016-10-15 DIAGNOSIS — H52221 Regular astigmatism, right eye: Secondary | ICD-10-CM | POA: Diagnosis not present

## 2016-10-15 DIAGNOSIS — H2513 Age-related nuclear cataract, bilateral: Secondary | ICD-10-CM | POA: Diagnosis not present

## 2016-10-15 DIAGNOSIS — H524 Presbyopia: Secondary | ICD-10-CM | POA: Diagnosis not present

## 2016-10-15 DIAGNOSIS — H5203 Hypermetropia, bilateral: Secondary | ICD-10-CM | POA: Diagnosis not present

## 2016-10-22 DIAGNOSIS — J3081 Allergic rhinitis due to animal (cat) (dog) hair and dander: Secondary | ICD-10-CM | POA: Diagnosis not present

## 2016-10-22 DIAGNOSIS — J3089 Other allergic rhinitis: Secondary | ICD-10-CM | POA: Diagnosis not present

## 2016-10-27 DIAGNOSIS — J3081 Allergic rhinitis due to animal (cat) (dog) hair and dander: Secondary | ICD-10-CM | POA: Diagnosis not present

## 2016-10-27 DIAGNOSIS — J3089 Other allergic rhinitis: Secondary | ICD-10-CM | POA: Diagnosis not present

## 2016-11-05 DIAGNOSIS — J3081 Allergic rhinitis due to animal (cat) (dog) hair and dander: Secondary | ICD-10-CM | POA: Diagnosis not present

## 2016-11-05 DIAGNOSIS — J301 Allergic rhinitis due to pollen: Secondary | ICD-10-CM | POA: Diagnosis not present

## 2016-11-05 DIAGNOSIS — J3089 Other allergic rhinitis: Secondary | ICD-10-CM | POA: Diagnosis not present

## 2016-11-07 DIAGNOSIS — J3089 Other allergic rhinitis: Secondary | ICD-10-CM | POA: Diagnosis not present

## 2016-11-07 DIAGNOSIS — J3081 Allergic rhinitis due to animal (cat) (dog) hair and dander: Secondary | ICD-10-CM | POA: Diagnosis not present

## 2016-11-17 DIAGNOSIS — Z923 Personal history of irradiation: Secondary | ICD-10-CM

## 2016-11-17 DIAGNOSIS — C50912 Malignant neoplasm of unspecified site of left female breast: Secondary | ICD-10-CM

## 2016-11-17 HISTORY — DX: Malignant neoplasm of unspecified site of left female breast: C50.912

## 2016-11-17 HISTORY — DX: Personal history of irradiation: Z92.3

## 2016-11-19 DIAGNOSIS — J209 Acute bronchitis, unspecified: Secondary | ICD-10-CM | POA: Diagnosis not present

## 2016-11-25 DIAGNOSIS — J3089 Other allergic rhinitis: Secondary | ICD-10-CM | POA: Diagnosis not present

## 2016-11-25 DIAGNOSIS — J3081 Allergic rhinitis due to animal (cat) (dog) hair and dander: Secondary | ICD-10-CM | POA: Diagnosis not present

## 2016-11-27 DIAGNOSIS — J3081 Allergic rhinitis due to animal (cat) (dog) hair and dander: Secondary | ICD-10-CM | POA: Diagnosis not present

## 2016-11-27 DIAGNOSIS — J3089 Other allergic rhinitis: Secondary | ICD-10-CM | POA: Diagnosis not present

## 2016-12-01 DIAGNOSIS — J3081 Allergic rhinitis due to animal (cat) (dog) hair and dander: Secondary | ICD-10-CM | POA: Diagnosis not present

## 2016-12-01 DIAGNOSIS — J3089 Other allergic rhinitis: Secondary | ICD-10-CM | POA: Diagnosis not present

## 2016-12-10 ENCOUNTER — Other Ambulatory Visit: Payer: Self-pay | Admitting: Internal Medicine

## 2016-12-10 DIAGNOSIS — Z1231 Encounter for screening mammogram for malignant neoplasm of breast: Secondary | ICD-10-CM

## 2016-12-11 DIAGNOSIS — I10 Essential (primary) hypertension: Secondary | ICD-10-CM | POA: Diagnosis not present

## 2016-12-11 DIAGNOSIS — E039 Hypothyroidism, unspecified: Secondary | ICD-10-CM | POA: Diagnosis not present

## 2016-12-11 DIAGNOSIS — J3089 Other allergic rhinitis: Secondary | ICD-10-CM | POA: Diagnosis not present

## 2016-12-11 DIAGNOSIS — J3081 Allergic rhinitis due to animal (cat) (dog) hair and dander: Secondary | ICD-10-CM | POA: Diagnosis not present

## 2016-12-15 DIAGNOSIS — J3081 Allergic rhinitis due to animal (cat) (dog) hair and dander: Secondary | ICD-10-CM | POA: Diagnosis not present

## 2016-12-15 DIAGNOSIS — J3089 Other allergic rhinitis: Secondary | ICD-10-CM | POA: Diagnosis not present

## 2016-12-18 DIAGNOSIS — E785 Hyperlipidemia, unspecified: Secondary | ICD-10-CM | POA: Diagnosis not present

## 2016-12-18 DIAGNOSIS — E039 Hypothyroidism, unspecified: Secondary | ICD-10-CM | POA: Diagnosis not present

## 2016-12-18 DIAGNOSIS — J3081 Allergic rhinitis due to animal (cat) (dog) hair and dander: Secondary | ICD-10-CM | POA: Diagnosis not present

## 2016-12-18 DIAGNOSIS — Z0000 Encounter for general adult medical examination without abnormal findings: Secondary | ICD-10-CM | POA: Diagnosis not present

## 2016-12-18 DIAGNOSIS — R05 Cough: Secondary | ICD-10-CM | POA: Diagnosis not present

## 2016-12-18 DIAGNOSIS — J3089 Other allergic rhinitis: Secondary | ICD-10-CM | POA: Diagnosis not present

## 2016-12-22 DIAGNOSIS — Z124 Encounter for screening for malignant neoplasm of cervix: Secondary | ICD-10-CM | POA: Diagnosis not present

## 2016-12-22 DIAGNOSIS — Z6831 Body mass index (BMI) 31.0-31.9, adult: Secondary | ICD-10-CM | POA: Diagnosis not present

## 2016-12-22 DIAGNOSIS — J3089 Other allergic rhinitis: Secondary | ICD-10-CM | POA: Diagnosis not present

## 2016-12-22 DIAGNOSIS — J3081 Allergic rhinitis due to animal (cat) (dog) hair and dander: Secondary | ICD-10-CM | POA: Diagnosis not present

## 2017-01-01 DIAGNOSIS — J3081 Allergic rhinitis due to animal (cat) (dog) hair and dander: Secondary | ICD-10-CM | POA: Diagnosis not present

## 2017-01-01 DIAGNOSIS — J3089 Other allergic rhinitis: Secondary | ICD-10-CM | POA: Diagnosis not present

## 2017-01-07 DIAGNOSIS — J3081 Allergic rhinitis due to animal (cat) (dog) hair and dander: Secondary | ICD-10-CM | POA: Diagnosis not present

## 2017-01-07 DIAGNOSIS — J3089 Other allergic rhinitis: Secondary | ICD-10-CM | POA: Diagnosis not present

## 2017-01-12 ENCOUNTER — Ambulatory Visit
Admission: RE | Admit: 2017-01-12 | Discharge: 2017-01-12 | Disposition: A | Discharge status: Home / Self Care | Payer: Medicare Other | Source: Ambulatory Visit | Attending: Internal Medicine | Admitting: Internal Medicine

## 2017-01-12 DIAGNOSIS — J3081 Allergic rhinitis due to animal (cat) (dog) hair and dander: Secondary | ICD-10-CM | POA: Diagnosis not present

## 2017-01-12 DIAGNOSIS — Z1231 Encounter for screening mammogram for malignant neoplasm of breast: Secondary | ICD-10-CM | POA: Diagnosis not present

## 2017-01-12 DIAGNOSIS — J3089 Other allergic rhinitis: Secondary | ICD-10-CM | POA: Diagnosis not present

## 2017-01-14 ENCOUNTER — Other Ambulatory Visit: Payer: Self-pay | Admitting: Internal Medicine

## 2017-01-14 DIAGNOSIS — R928 Other abnormal and inconclusive findings on diagnostic imaging of breast: Secondary | ICD-10-CM

## 2017-01-20 ENCOUNTER — Ambulatory Visit
Admission: RE | Admit: 2017-01-20 | Discharge: 2017-01-20 | Disposition: A | Discharge status: Home / Self Care | Payer: Medicare Other | Source: Ambulatory Visit | Attending: Internal Medicine | Admitting: Internal Medicine

## 2017-01-20 ENCOUNTER — Other Ambulatory Visit: Payer: Self-pay | Admitting: Internal Medicine

## 2017-01-20 DIAGNOSIS — N6323 Unspecified lump in the left breast, lower outer quadrant: Secondary | ICD-10-CM | POA: Diagnosis not present

## 2017-01-20 DIAGNOSIS — R928 Other abnormal and inconclusive findings on diagnostic imaging of breast: Secondary | ICD-10-CM

## 2017-01-20 DIAGNOSIS — J3089 Other allergic rhinitis: Secondary | ICD-10-CM | POA: Diagnosis not present

## 2017-01-20 DIAGNOSIS — N6321 Unspecified lump in the left breast, upper outer quadrant: Secondary | ICD-10-CM | POA: Diagnosis not present

## 2017-01-20 DIAGNOSIS — N6002 Solitary cyst of left breast: Secondary | ICD-10-CM | POA: Diagnosis not present

## 2017-01-20 DIAGNOSIS — C50812 Malignant neoplasm of overlapping sites of left female breast: Secondary | ICD-10-CM | POA: Diagnosis not present

## 2017-01-20 DIAGNOSIS — J3081 Allergic rhinitis due to animal (cat) (dog) hair and dander: Secondary | ICD-10-CM | POA: Diagnosis not present

## 2017-01-21 ENCOUNTER — Telehealth: Payer: Self-pay | Admitting: *Deleted

## 2017-01-21 NOTE — Telephone Encounter (Signed)
Confirmed [facility] for 01/28/17 at 1215pm .  Instructions and contact information given.

## 2017-01-26 ENCOUNTER — Encounter: Payer: Self-pay | Admitting: General Surgery

## 2017-01-26 ENCOUNTER — Other Ambulatory Visit: Payer: Self-pay | Admitting: *Deleted

## 2017-01-26 DIAGNOSIS — C50412 Malignant neoplasm of upper-outer quadrant of left female breast: Secondary | ICD-10-CM

## 2017-01-26 DIAGNOSIS — Z170 Estrogen receptor positive status [ER+]: Principal | ICD-10-CM

## 2017-01-28 ENCOUNTER — Encounter: Payer: Self-pay | Admitting: General Practice

## 2017-01-28 ENCOUNTER — Ambulatory Visit (HOSPITAL_BASED_OUTPATIENT_CLINIC_OR_DEPARTMENT_OTHER): Payer: Medicare Other | Admitting: Oncology

## 2017-01-28 ENCOUNTER — Ambulatory Visit
Admission: RE | Admit: 2017-01-28 | Discharge: 2017-01-28 | Disposition: A | Discharge status: Home / Self Care | Payer: Medicare Other | Source: Ambulatory Visit | Attending: Radiation Oncology | Admitting: Radiation Oncology

## 2017-01-28 ENCOUNTER — Ambulatory Visit: Payer: Medicare Other | Attending: General Surgery | Admitting: Physical Therapy

## 2017-01-28 ENCOUNTER — Other Ambulatory Visit: Payer: Self-pay | Admitting: General Surgery

## 2017-01-28 ENCOUNTER — Other Ambulatory Visit (HOSPITAL_BASED_OUTPATIENT_CLINIC_OR_DEPARTMENT_OTHER): Payer: Medicare Other

## 2017-01-28 ENCOUNTER — Encounter: Payer: Self-pay | Admitting: Oncology

## 2017-01-28 ENCOUNTER — Other Ambulatory Visit: Payer: Self-pay | Admitting: *Deleted

## 2017-01-28 DIAGNOSIS — C50412 Malignant neoplasm of upper-outer quadrant of left female breast: Secondary | ICD-10-CM | POA: Insufficient documentation

## 2017-01-28 DIAGNOSIS — Z9889 Other specified postprocedural states: Secondary | ICD-10-CM | POA: Diagnosis not present

## 2017-01-28 DIAGNOSIS — Z9049 Acquired absence of other specified parts of digestive tract: Secondary | ICD-10-CM | POA: Diagnosis not present

## 2017-01-28 DIAGNOSIS — Z170 Estrogen receptor positive status [ER+]: Principal | ICD-10-CM

## 2017-01-28 DIAGNOSIS — Z90710 Acquired absence of both cervix and uterus: Secondary | ICD-10-CM | POA: Diagnosis not present

## 2017-01-28 DIAGNOSIS — I10 Essential (primary) hypertension: Secondary | ICD-10-CM | POA: Diagnosis not present

## 2017-01-28 DIAGNOSIS — R293 Abnormal posture: Secondary | ICD-10-CM | POA: Diagnosis not present

## 2017-01-28 DIAGNOSIS — Z8719 Personal history of other diseases of the digestive system: Secondary | ICD-10-CM | POA: Diagnosis not present

## 2017-01-28 DIAGNOSIS — J45909 Unspecified asthma, uncomplicated: Secondary | ICD-10-CM | POA: Diagnosis not present

## 2017-01-28 DIAGNOSIS — Z6831 Body mass index (BMI) 31.0-31.9, adult: Secondary | ICD-10-CM | POA: Diagnosis not present

## 2017-01-28 LAB — CBC WITH DIFFERENTIAL/PLATELET
BASO%: 1.1 % (ref 0.0–2.0)
Basophils Absolute: 0.1 10*3/uL (ref 0.0–0.1)
EOS%: 2.2 % (ref 0.0–7.0)
Eosinophils Absolute: 0.2 10*3/uL (ref 0.0–0.5)
HEMATOCRIT: 45.1 % (ref 34.8–46.6)
HEMOGLOBIN: 15 g/dL (ref 11.6–15.9)
LYMPH#: 2.5 10*3/uL (ref 0.9–3.3)
LYMPH%: 34 % (ref 14.0–49.7)
MCH: 30.1 pg (ref 25.1–34.0)
MCHC: 33.2 g/dL (ref 31.5–36.0)
MCV: 90.6 fL (ref 79.5–101.0)
MONO#: 0.7 10*3/uL (ref 0.1–0.9)
MONO%: 9.2 % (ref 0.0–14.0)
NEUT%: 53.5 % (ref 38.4–76.8)
NEUTROS ABS: 3.9 10*3/uL (ref 1.5–6.5)
PLATELETS: 202 10*3/uL (ref 145–400)
RBC: 4.98 10*6/uL (ref 3.70–5.45)
RDW: 13.7 % (ref 11.2–14.5)
WBC: 7.4 10*3/uL (ref 3.9–10.3)

## 2017-01-28 LAB — COMPREHENSIVE METABOLIC PANEL
ALBUMIN: 4.1 g/dL (ref 3.5–5.0)
ALT: 14 U/L (ref 0–55)
AST: 17 U/L (ref 5–34)
Alkaline Phosphatase: 80 U/L (ref 40–150)
Anion Gap: 9 mEq/L (ref 3–11)
BILIRUBIN TOTAL: 0.51 mg/dL (ref 0.20–1.20)
BUN: 9.3 mg/dL (ref 7.0–26.0)
CALCIUM: 9.9 mg/dL (ref 8.4–10.4)
CHLORIDE: 106 meq/L (ref 98–109)
CO2: 28 mEq/L (ref 22–29)
CREATININE: 0.8 mg/dL (ref 0.6–1.1)
EGFR: 72 mL/min/{1.73_m2} — ABNORMAL LOW (ref >90)
Glucose: 98 mg/dl (ref 70–140)
Potassium: 4.3 mEq/L (ref 3.5–5.1)
Sodium: 143 mEq/L (ref 136–145)
TOTAL PROTEIN: 7.8 g/dL (ref 6.4–8.3)

## 2017-01-28 NOTE — Progress Notes (Signed)
Radiation Oncology         [phone] ________________________________  Name: Erin Novak MRN: [id number]  Date: 01/28/2017  DOB: August 17, 1941  TG:YBWLS Maudie Mercury, MD  Fanny Skates, MD     REFERRING PHYSICIAN: Fanny Skates, MD   DIAGNOSIS: There were no encounter diagnoses.  76 y.o. 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  HISTORY OF PRESENT ILLNESS: Erin Novak is a 76 y.o. female seen in multidisciplinary breast clinic for a newly diagnosed left breast cancer. Screening mammography on 01/12/17 revealed a possible mass in the left breast and further evaluation with diagnostic mammography 01/20/17 confirmed the prescience of an area of distortion in the lateral aspect of the left breast. The patient underwent further evaluation with breast ultrasound on 01/20/17 which revealed a suspicious 1.0 x 0.9 x 0.6 cm mass in the 3 o'clock location of the left breast 1 cm from the nipple, axilla was negative for adenopathy. She was also noted to have a benign cyst in the 11 o'clock location of the left breast. U/S guided core biopsy on 01/20/17 showed invasive mammary carcinoma, grade 2. Hormone receptor status is ER+ (90%), PR+ (80%), HER-2 (-) and Ki67 (15%). She comes today to review the treatment options and recommendations for her newly diagnosed breast cancer.    PREVIOUS RADIATION THERAPY: No   PAST MEDICAL HISTORY:  Past Medical History:  Diagnosis Date  . Abdominal pain   . Allergy   . Asthma   . Bronchitis   . Cholelithiasis 06-20-2011   [place]   . Diarrhea   . GERD (gastroesophageal reflux disease)   . Hyperlipidemia   . Hypertension   . Migraines   . PONV (postoperative nausea and vomiting)   . Thyroid disease    hypothyroid  . Ulcer ([place])        PAST SURGICAL HISTORY: Past Surgical History:  Procedure Laterality Date  . ABDOMINAL HYSTERECTOMY    . CHOLECYSTECTOMY  12/25/2011   Procedure: LAPAROSCOPIC  CHOLECYSTECTOMY WITH INTRAOPERATIVE CHOLANGIOGRAM;  Surgeon: Earnstine Regal, MD;  Location: [facility];  Service: General;  Laterality: N/A;  laparoscopic cholecystectomy with intraoperative cholangiogram  . COLONOSCOPY  01/10/2003   internal hemorrhoids (Dr. Lajoyce Corners)  . FOOT SURGERY     right  . hysterectomy    . ruptured disk     in neck  . UPPER GASTROINTESTINAL ENDOSCOPY  01/10/2003   hiatal hernia (Dr. Lajoyce Corners)     FAMILY HISTORY:  Family History  Problem Relation Age of Onset  . Asthma Father   . Colon cancer Neg Hx   . Stomach cancer Neg Hx   . Esophageal cancer Neg Hx   . Rectal cancer Neg Hx      SOCIAL HISTORY:  reports that she has never smoked. She has never used smokeless tobacco. She reports that she drinks alcohol. She reports that she does not use drugs.  She is married and lives in [place], [place]. She is accompanied by her daughter, Otho Perl.  She is retired.   ALLERGIES: Amoxicillin-pot clavulanate; Amoxicillin-pot clavulanate; Lisinopril; Percodan [oxycodone-aspirin]; and Prednisone   MEDICATIONS:  Current Outpatient Prescriptions  Medication Sig Dispense Refill  . ALBUTEROL SULFATE HFA IN Inhale 2 puffs into the lungs every 4 (four) hours as needed.    . budesonide-formoterol (SYMBICORT) 160-4.5 MCG/ACT inhaler Inhale 2 puffs into the lungs 2 (two) times daily as needed. For shortness of breath    . Cholecalciferol (VITAMIN D-3) 5000 UNITS  TABS Take 5,000 Units by mouth daily.     Marland Kitchen diltiazem (TIAZAC) 240 MG 24 hr capsule Take 240 mg by mouth every morning.     Marland Kitchen ibuprofen (ADVIL,MOTRIN) 200 MG tablet Take 400 mg by mouth every 6 (six) hours as needed. For pain     . levothyroxine (SYNTHROID, LEVOTHROID) 88 MCG tablet Take 88 mcg by mouth every morning.     . Multiple Vitamins-Minerals (HAIR/SKIN/NAILS PO) Take 1 tablet by mouth daily.    . NON FORMULARY Inject 1 Syringe into the muscle once a week. Allergy injections weekly    . Omega-3 Fatty Acids (FISH OIL) 1200 MG CAPS  Take 1,200 mg by mouth daily.      No current facility-administered medications for this encounter.    Gynecologic History  Age at first menstrual period? 11  Are you still having periods? No Approximate date of last period? N/A  If you are still having periods: Are your periods regular? N/A  If you no longer have periods: Have you used hormone replacement? No  If YES, for how long? N/A When did you stop? N/A Obstetric History:  How many children have you carried to term? 2 Your age at first live birth? 25  Pregnant now or trying to get pregnant? No  Have you used birth control pills or hormone shots for contraception? No  If so, for how long (or approximate dates)? N/A  Would you be interested in learning more about the options to preserve fertility? N/A Health Maintenance:  Have you ever had a colonoscopy? Yes If yes, date? N/A  Have you ever had a bone density? Yes If yes, date? 12/14/2014  Date of your last PAP smear? N/A Date of your FIRST mammogram? 01/10/2006  REVIEW OF SYSTEMS: On review of systems, the patient reports that she is doing well overall. She denies any palpable breast masses or nipple discharge.  She has recovered well from her biopsy and denies drainage or bleeding from the site. She denies any chest pain, shortness of breath, fevers, chills, night sweats, unintended weight changes. She reports occasional cough. She has seasonal allergies and uses albuterol inhaler prn for her allergy triggered asthma.  She denies any bowel or bladder disturbances, and denies abdominal pain, nausea or vomiting. She denies any new musculoskeletal or joint aches or pains. She reports hypothyroid disease that is well controlled on medication. A complete review of systems is obtained and is otherwise negative.   PHYSICAL EXAM:  Wt Readings from Last 3 Encounters:  01/28/17 177 lb 1.6 oz (80.3 kg)  08/25/13 180 lb (81.6 kg)  08/11/13 180 lb 9.6 oz (81.9 kg)   Temp Readings from Last 3  Encounters:  01/28/17 97.7 F (36.5 C) (Oral)  08/25/13 (!) 96.8 F (36 C)  01/28/12 98.2 F (36.8 C) (Temporal)   BP Readings from Last 3 Encounters:  01/28/17 (!) 178/81  08/25/13 (!) 180/81  01/28/12 (!) 166/80   Pulse Readings from Last 3 Encounters:  01/28/17 83  08/25/13 70  01/28/12 72    0/10  In general this is a well appearing caucasian female in no acute distress. She is alert and oriented x4 and appropriate throughout the examination. HEENT reveals that the patient is normocephalic, atraumatic. EOMs are intact. PERRLA. Skin is intact without any evidence of gross lesions. Cardiovascular exam reveals a regular rate and rhythm, no clicks rubs or murmurs are auscultated. Chest is clear to auscultation bilaterally. Bilateral breast exam was performed, and revealed no  palpable masses and no nipple discharge bilaterally. Biopsy site on the left breast is well healed without drainage or signs of infection.  There is mild residual ecchymosis at the biopsy site. Lymphatic assessment is performed and does not reveal any adenopathy in the cervical, supraclavicular, axillary, or inguinal chains. Abdomen has active bowel sounds in all quadrants and is intact. The abdomen is soft, non tender, non distended. Lower extremities are negative for pretibial pitting edema, deep calf tenderness, cyanosis or clubbing.    ECOG = 0  0 - Asymptomatic (Fully active, able to carry on all predisease activities without restriction)  1 - Symptomatic but completely ambulatory (Restricted in physically strenuous activity but ambulatory and able to carry out work of a light or sedentary nature. For example, light housework, office work)  2 - Symptomatic, <50% in bed during the day (Ambulatory and capable of all self care but unable to carry out any work activities. Up and about more than 50% of waking hours)  3 - Symptomatic, >50% in bed, but not bedbound (Capable of only limited self-care, confined to bed  or chair 50% or more of waking hours)  4 - Bedbound (Completely disabled. Cannot carry on any self-care. Totally confined to bed or chair)  5 - Death   Eustace Pen MM, Creech RH, Tormey DC, et al. [phone]). "Toxicity and response criteria of the [facility]". [place] Oncol. 5 (6): 649-55    LABORATORY DATA:  Lab Results  Component Value Date   WBC 7.4 01/28/2017   HGB 15.0 01/28/2017   HCT 45.1 01/28/2017   MCV 90.6 01/28/2017   PLT 202 01/28/2017   Lab Results  Component Value Date   NA 143 01/28/2017   K 4.3 01/28/2017   CL 105 12/23/2011   CO2 28 01/28/2017   Lab Results  Component Value Date   ALT 14 01/28/2017   AST 17 01/28/2017   ALKPHOS 80 01/28/2017   BILITOT 0.51 01/28/2017      RADIOGRAPHY: US Breast Ltd Uni Left Inc Axilla  Result Date: 01/20/2017 CLINICAL DATA:  Patient returns after screening study for evaluation of a possible left breast mass. EXAM: 2D DIGITAL DIAGNOSTIC LEFT MAMMOGRAM WITH ADJUNCT TOMO ULTRASOUND LEFT BREAST COMPARISON:  01/12/2017 and earlier ACR Breast Density Category b: There are scattered areas of fibroglandular density. FINDINGS: Spot 2D/3D images are performed which confirm presence of an area distortion in the lateral aspect of the left breast. A partially obscured rounded mass is identified in the upper inner quadrant of the left breast. On physical exam, I palpate no abnormality in the circumareolar region of the left breast. Targeted ultrasound is performed, showing a simple cyst in the 11 o'clock location of the left breast 3 cm from the nipple measuring 0.9 x 0.5 x 1.2 cm. In the 3 o'clock location of the left breast 1 cm from the nipple there is an irregular hypoechoic mass which is taller than wide and measures 1.0 x 0.9 x 0.6 cm. Evaluation of the left axilla is negative for adenopathy. IMPRESSION: 1. Suspicious mass in the 3 o'clock location of the left breast 1 cm from the nipple for which biopsy is recommended.  2. Benign cyst in the 11 o'clock location of the left breast. 3. No imaging evidence for adenopathy. RECOMMENDATION: Ultrasound-guided core biopsy is recommended and will be performed later today at the request of the patient. I have discussed the findings and recommendations with the patient. Results were also provided in writing  at the conclusion of the visit. If applicable, a reminder letter will be sent to the patient regarding the next appointment. BI-RADS CATEGORY  4: Suspicious. Electronically Signed   By: Nolon Nations M.D.   On: 01/20/2017 12:08   Mm Diag Breast Tomo Uni Left  Result Date: 01/20/2017 CLINICAL DATA:  Patient returns after screening study for evaluation of a possible left breast mass. EXAM: 2D DIGITAL DIAGNOSTIC LEFT MAMMOGRAM WITH ADJUNCT TOMO ULTRASOUND LEFT BREAST COMPARISON:  01/12/2017 and earlier ACR Breast Density Category b: There are scattered areas of fibroglandular density. FINDINGS: Spot 2D/3D images are performed which confirm presence of an area distortion in the lateral aspect of the left breast. A partially obscured rounded mass is identified in the upper inner quadrant of the left breast. On physical exam, I palpate no abnormality in the circumareolar region of the left breast. Targeted ultrasound is performed, showing a simple cyst in the 11 o'clock location of the left breast 3 cm from the nipple measuring 0.9 x 0.5 x 1.2 cm. In the 3 o'clock location of the left breast 1 cm from the nipple there is an irregular hypoechoic mass which is taller than wide and measures 1.0 x 0.9 x 0.6 cm. Evaluation of the left axilla is negative for adenopathy. IMPRESSION: 1. Suspicious mass in the 3 o'clock location of the left breast 1 cm from the nipple for which biopsy is recommended. 2. Benign cyst in the 11 o'clock location of the left breast. 3. No imaging evidence for adenopathy. RECOMMENDATION: Ultrasound-guided core biopsy is recommended and will be performed later today at  the request of the patient. I have discussed the findings and recommendations with the patient. Results were also provided in writing at the conclusion of the visit. If applicable, a reminder letter will be sent to the patient regarding the next appointment. BI-RADS CATEGORY  4: Suspicious. Electronically Signed   By: Nolon Nations M.D.   On: 01/20/2017 12:08   Mm Screening Breast Tomo Bilateral  Addendum Date: 01/20/2017   ADDENDUM REPORT: 01/20/2017 16:32 ADDENDUM: Corrected report: The area of concern is within the left breast. Right breast is negative. Electronically Signed   By: Nolon Nations M.D.   On: 01/20/2017 16:32   Result Date: 01/20/2017 CLINICAL DATA:  Screening. EXAM: 2D DIGITAL SCREENING BILATERAL MAMMOGRAM WITH CAD AND ADJUNCT TOMO COMPARISON:  Previous exam(s). ACR Breast Density Category b: There are scattered areas of fibroglandular density. FINDINGS: In the right breast, a possible mass warrants further evaluation. In the left breast, no findings suspicious for malignancy. Images were processed with CAD. IMPRESSION: Further evaluation is suggested for possible mass in the right breast. RECOMMENDATION: Diagnostic mammogram and possibly ultrasound of the right breast. (Code:[id number]-R-65M) The patient will be contacted regarding the findings, and additional imaging will be scheduled. BI-RADS CATEGORY  0: Incomplete. Need additional imaging evaluation and/or prior mammograms for comparison. Electronically Signed: By: Lajean Manes M.D. On: 01/13/2017 13:13   Mm Clip Placement Left  Result Date: 01/20/2017 CLINICAL DATA:  Status post ultrasound-guided core biopsy of mass in the 3 o'clock location of the left breast. EXAM: DIAGNOSTIC LEFT MAMMOGRAM POST ULTRASOUND BIOPSY COMPARISON:  Previous exam(s). FINDINGS: Mammographic images were obtained following ultrasound guided biopsy of mass in the 3 o'clock location of the left breast. A coil shaped clip is identified in the area of of distortion  in the lateral aspect of the left breast. IMPRESSION: Tissue marker clip in the expected location following biopsy. Final Assessment: Post Procedure  Mammograms for Marker Placement Electronically Signed   By: Nolon Nations M.D.   On: 01/20/2017 13:52   [place] Lt Breast Bx W Loc Dev 1st Lesion Img Bx Spec US Guide  Addendum Date: 01/21/2017   ADDENDUM REPORT: 01/21/2017 13:21 ADDENDUM: Pathology revealed GRADE II INVASIVE MAMMARY CARCINOMA of the Left breast, 3 o'clock. This was found to be concordant by Dr. Nolon Nations. Pathology results were discussed with the patient by telephone. The patient reported doing well after the biopsy with tenderness at the site. Post biopsy instructions and care were reviewed and questions were answered. The patient was encouraged to call [facility] for any additional concerns. The patient was referred to [facility] at [facility] on January 28, 2017. Pathology results reported by Terie Purser, RN on 01/21/2017. Electronically Signed   By: Nolon Nations M.D.   On: 01/21/2017 13:21   Result Date: 01/21/2017 CLINICAL DATA:  Patient presents for ultrasound-guided core biopsy of mass in the 3 o'clock location of the left breast. EXAM: ULTRASOUND GUIDED LEFT BREAST CORE NEEDLE BIOPSY COMPARISON:  Previous exam(s). FINDINGS: I met with the patient and we discussed the procedure of ultrasound-guided biopsy, including benefits and alternatives. We discussed the high likelihood of a successful procedure. We discussed the risks of the procedure, including infection, bleeding, tissue injury, clip migration, and inadequate sampling. Informed written consent was given. The usual time-out protocol was performed immediately prior to the procedure. Using sterile technique and 1% Lidocaine as local anesthetic, under direct ultrasound visualization, a 12 gauge spring-loaded device was used to perform biopsy  of mass in the 3 o'clock location of the left breast using a lateral approach. At the conclusion of the procedure a coil shaped tissue marker clip was deployed into the biopsy cavity. Follow up 2 view mammogram was performed and dictated separately. IMPRESSION: Ultrasound guided biopsy of left breast mass. No apparent complications. Electronically Signed: By: Nolon Nations M.D. On: 01/20/2017 13:43       IMPRESSION/PLAN: 1. 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 Dr. Lisbeth Renshaw discusses the pathology findings and reviews the nature of invasive breast disease. The consensus from the breast conference include breast conservation with lumpectomy with sentinel mapping. She will be scheduled for Breast MRI for further staging and evaluation prior to surgery. Provided that chemotherapy is not indicated, the patient's course would then be followed by external radiotherapy to the breast followed by antiestrogen therapy with an AI.  If chemotherapy is indicated, we would follow chemotherapy. We discussed the risks, benefits, short, and long term effects of radiotherapy. Dr. Lisbeth Renshaw discusses the delivery and logistics of radiotherapy. Dr. Lisbeth Renshaw reviews his recommendation for a course of approximately 4 weeks of radiotherapy and we would possibly use a breath hold technique to avoid exposure to the heart, as her breast disease is left-sided. We will see her back about 2 weeks after surgery to move forward with the simulation and planning process and anticipate starting radiotherapy about 4 weeks after surgery. She is leaning towards having radiotherapy but is currently undecided.  She will communicate with her breast navigator regarding her final decision so that we can schedule her CT SIM on the date of follow up since she travels from [place], [place].  The above documentation reflects my direct findings during this shared patient visit. Please see separate note  by Dr. Lisbeth Renshaw on this date for the remainder of  the patient's plan of care.    Nicholos Johns, PA-C  This document serves as a record of services personally performed by Kyung Rudd, MD and Freeman Caldron, PA-C. It was created on their behalf by Arlyce Harman, a trained medical scribe. The creation of this record is based on the scribe's personal observations and the provider's statements to them. This document has been checked and approved by the attending provider.

## 2017-01-28 NOTE — Patient Instructions (Signed)

## 2017-01-28 NOTE — Therapy (Signed)
[place], [place], [address] Phone: [phone]   Fax:  [phone]  Physical Therapy Evaluation  Patient Details  Name: Erin Novak MRN: [id number] Date of Birth: 04-Apr-1941 Referring Provider: Dr. Fanny Skates  Encounter Date: 01/28/2017      PT End of Session - 01/28/17 1510    Visit Number 1   Number of Visits 1   PT Start Time 1344   PT Stop Time 1408   PT Time Calculation (min) 24 min   Activity Tolerance Patient tolerated treatment well   Behavior During Therapy [facility] for tasks assessed/performed      Past Medical History:  Diagnosis Date  . Abdominal pain   . Allergy   . Asthma   . Bronchitis   . Cholelithiasis 06-20-2011   [place]   . Diarrhea   . GERD (gastroesophageal reflux disease)   . Hyperlipidemia   . Hypertension   . Migraines   . PONV (postoperative nausea and vomiting)   . Thyroid disease    hypothyroid  . Ulcer [facility])     Past Surgical History:  Procedure Laterality Date  . ABDOMINAL HYSTERECTOMY    . CHOLECYSTECTOMY  12/25/2011   Procedure: LAPAROSCOPIC CHOLECYSTECTOMY WITH INTRAOPERATIVE CHOLANGIOGRAM;  Surgeon: Earnstine Regal, MD;  Location: [facility];  Service: General;  Laterality: N/A;  laparoscopic cholecystectomy with intraoperative cholangiogram  . COLONOSCOPY  01/10/2003   internal hemorrhoids (Dr. Lajoyce Corners)  . FOOT SURGERY     right  . hysterectomy    . ruptured disk     in neck  . UPPER GASTROINTESTINAL ENDOSCOPY  01/10/2003   hiatal hernia (Dr. Lajoyce Corners)    There were no vitals filed for this visit.       Subjective Assessment - 01/28/17 1412    Subjective Patient reports she is here today to be seen by her medical team for her newly diagnosed left breast cancer.   Patient is accompained by: Family member   Pertinent History Patient was diagnosed on 01/12/17 with left invasive lobular carcinoma breast cancer. It is ER/PR positive, HER2 negative, measures 1 cm  and is located in the upper outer quadrant. She has no other significant medical problems that would impact rehab.   Patient Stated Goals Reduce lymphedema risk and learn post op shoulder ROM HEP   Currently in Pain? No/denies            [facility] PT Assessment - 01/28/17 0001      Assessment   Medical Diagnosis Left breast cancer   Referring Provider Dr. Fanny Skates   Onset Date/Surgical Date 01/12/17   Hand Dominance Right   Prior Therapy none     Precautions   Precautions Other (comment)   Precaution Comments active cancer     Restrictions   Weight Bearing Restrictions No     Balance Screen   Has the patient fallen in the past 6 months No   Has the patient had a decrease in activity level because of a fear of falling?  No   Is the patient reluctant to leave their home because of a fear of falling?  No     Home Environment   Living Environment Private residence   Living Arrangements Alone   Available Help at Discharge Family  Her daughters live near by and one is with her today.     Prior Function   Level of Independence Independent   Vocation Retired   Leisure She does not  exercise     Cognition   Overall Cognitive Status Within Functional Limits for tasks assessed     Posture/Postural Control   Posture/Postural Control Postural limitations   Postural Limitations Rounded Shoulders;Forward head     ROM / Strength   AROM / PROM / Strength AROM;Strength     AROM   AROM Assessment Site Shoulder;Cervical   Right/Left Shoulder Right;Left   Right Shoulder Extension 52 Degrees   Right Shoulder Flexion 141 Degrees   Right Shoulder ABduction 152 Degrees   Right Shoulder Internal Rotation 76 Degrees   Right Shoulder External Rotation 81 Degrees   Left Shoulder Extension 53 Degrees   Left Shoulder Flexion 145 Degrees   Left Shoulder ABduction 151 Degrees   Left Shoulder Internal Rotation 76 Degrees   Left Shoulder External Rotation 72 Degrees   Cervical Flexion  WNL   Cervical Extension 75% limited   Cervical - Right Side Bend 75% limited   Cervical - Left Side Bend 75% limited   Cervical - Right Rotation 50% limited   Cervical - Left Rotation 50% limited     Strength   Overall Strength Within functional limits for tasks performed           LYMPHEDEMA/ONCOLOGY QUESTIONNAIRE - 01/28/17 1444      Type   Cancer Type Left breast cancer     Lymphedema Assessments   Lymphedema Assessments Upper extremities     Right Upper Extremity Lymphedema   10 cm Proximal to Olecranon Process 30.3 cm   Olecranon Process 26.3 cm   10 cm Proximal to Ulnar Styloid Process 21 cm   Just Proximal to Ulnar Styloid Process 16.2 cm   Across Hand at [employer] 18.6 cm   At [place] of 2nd Digit 6.5 cm     Left Upper Extremity Lymphedema   10 cm Proximal to Olecranon Process 29.4 cm   Olecranon Process 26 cm   10 cm Proximal to Ulnar Styloid Process 20.1 cm   Just Proximal to Ulnar Styloid Process 16.2 cm   Across Hand at [employer] 18.4 cm   At [place] of 2nd Digit 6.6 cm      Patient was instructed today in a home exercise program today for post op shoulder range of motion. These included active assist shoulder flexion in sitting, scapular retraction, wall walking with shoulder abduction, and hands behind head external rotation.  She was encouraged to do these twice a day, holding 3 seconds and repeating 5 times when permitted by her physician.         PT Education - 01/28/17 1508    Education provided Yes   Education Details Lymphedema risk reduction and post op shoulder ROM HEP   Person(s) Educated Patient;Child(ren)   Methods Explanation;Demonstration;Handout   Comprehension Returned demonstration;Verbalized understanding              Breast Clinic Goals - 01/28/17 1514      Patient will be able to verbalize understanding of pertinent lymphedema risk reduction practices relevant to her diagnosis specifically related to skin care.    Time 1   Period Days   Status Achieved     Patient will be able to return demonstrate and/or verbalize understanding of the post-op home exercise program related to regaining shoulder range of motion.   Time 1   Period Days   Status Achieved     Patient will be able to verbalize understanding of the importance of attending the postoperative After Breast  Cancer Class for further lymphedema risk reduction education and therapeutic exercise.   Time 1   Period Days   Status Achieved              Plan - 01/28/17 1510    Clinical Impression Statement Patient was diagnosed on 01/12/17 with left invasive lobular carcinoma breast cancer. It is ER/PR positive, HER2 negative, measures 1 cm and is located in the upper outer quadrant. She has no other significant medical problems that would impact rehab. Her multidisciplinary medical team met prior to her assessments to determine a recommended treatment plan. She is planning to have a left lumpectomy and sentinel node biopsy followed by radiation and anti-estrogen therapy. She may benefit from post op PT to regain shoulder ROM and reduce lymphedema risk. Due to her lack of comorbidities, her eval is of low complexity.   Rehab Potential Excellent   Clinical Impairments Affecting Rehab Potential None   PT Frequency One time visit   PT Treatment/Interventions Patient/family education;Therapeutic exercise   PT Next Visit Plan Will f/u after surgery to determine PT needs   PT Home Exercise Plan Post op shoulder ROM HEP   Consulted and Agree with Plan of Care Patient;Family member/caregiver   Family Member Consulted Daughter      Patient will benefit from skilled therapeutic intervention in order to improve the following deficits and impairments:  Decreased range of motion, Pain, Impaired UE functional use, Decreased knowledge of precautions, Postural dysfunction  Visit Diagnosis: Abnormal posture - Plan: PT plan of care cert/re-cert  Carcinoma  of upper-outer quadrant of left breast in female, estrogen receptor positive ([place]) - Plan: PT plan of care cert/re-cert      G-Codes - 20/80/22 1514    Functional Assessment Tool Used (Outpatient Only) Clinical Judgement   Functional Limitation Other PT primary   Other PT Primary Current Status (V3612) At least 1 percent but less than 20 percent impaired, limited or restricted   Other PT Primary Goal Status (A4497) At least 1 percent but less than 20 percent impaired, limited or restricted   Other PT Primary Discharge Status (N3005) At least 1 percent but less than 20 percent impaired, limited or restricted     Patient will follow up at outpatient cancer rehab if needed following surgery.  If the patient requires physical therapy at that time, a specific plan will be dictated and sent to the referring physician for approval. The patient was educated today on appropriate basic range of motion exercises to begin post operatively and the importance of attending the After Breast Cancer class following surgery.  Patient was educated today on lymphedema risk reduction practices as it pertains to recommendations that will benefit the patient immediately following surgery.  She verbalized good understanding.  No additional physical therapy is indicated at this time.     Problem List Patient Active Problem List   Diagnosis Date Noted  . Malignant neoplasm of upper-outer quadrant of left breast in female, estrogen receptor positive ([place]) 01/28/2017  . Cholelithiasis without obstruction 07/15/2011  . Gallstones 07/10/2011  . GERD (gastroesophageal reflux disease) 07/10/2011  . Obesity 07/10/2011  . 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 HYPOTHYROIDISM 02/07/2008  . HYPERLIPIDEMIA 02/07/2008  . HYPERTENSION 02/07/2008  . ASTHMA 02/07/2008   Annia Friendly, PT 01/28/17 3:16 PM  [place], [place], [address] Phone:  [phone]   Fax:  [phone]  Name: NAKEYSHA PASQUAL MRN: [id number] Date of Birth: 1941/04/01

## 2017-01-28 NOTE — Progress Notes (Signed)
[facility]  [phone] [phone]) [phone]     ID: Erin Novak DOB: Oct 20, 1941  MR#: [id number]  DJM#:[id number]  Patient Care Team: [name], MD as PCP - General (Internal Medicine) Fanny Skates, MD as Consulting Physician (General Surgery) Chauncey Cruel, MD as Consulting Physician (Oncology) Kyung Rudd, MD as Consulting Physician (Radiation Oncology) Chauncey Cruel, MD OTHER MD:  CHIEF COMPLAINT: Estrogen receptor positive lobular breast cancer  CURRENT TREATMENT: Awaiting definitive surgery   BREAST CANCER HISTORY: The patient had routine screening mammography with tomography at the [facility] 01/12/2017 showing an area of concern in the left breast. On 01/20/2017 she underwent left diagnostic mammography with tomography and left breast ultrasonography. The breast density was category B. In the upper outer quadrant of the left breast there was a partially obscured rounded mass which was not palpable by exam. Ultrasonography it was located at the 11:00 radiant 3 cm from the nipple and measured 1.2 cm.  Biopsy of this mass 01/20/2017 showed (S [id number] [id number]) invasive lobular carcinoma, E-cadherin negative, grade 2, estrogen receptor 90% positive, progesterone receptor 80% positive, both with strong staining intensity, with an MIB-1 of 15%, and no HER-2 amplification, the signals ratio being 1.20 and the number per cell 1.80.  Her subsequent history is as detailed below  INTERVAL HISTORY: Erin Novak was evaluated in the multidisciplinary breast cancer clinic 01/28/2017 a combined by her daughter Erin Novak area Her case was also presented in the multidisciplinary breast cancer conference that same morning. At that time a preliminary plan was proposed: MRI because it is a lobular tumor, but likely breast conserving surgery with sentinel lymph node sampling, no chemotherapy/no Oncotype, plus or minus adjuvant radiation, finally anti-estrogen  therapy  REVIEW OF SYSTEMS: There were no specific symptoms leading to the original mammogram, which was routinely scheduled. The patient denies unusual headaches, visual changes, nausea, vomiting, stiff neck, dizziness, or gait imbalance. There has been no cough, phlegm production, or pleurisy, no chest pain or pressure, and no change in bowel or bladder habits. The patient denies fever, rash, bleeding, unexplained fatigue or unexplained weight loss. She admits to some allergy symptoms including a frequent dry cough. She does not exercise regularly. A detailed review of systems was otherwise entirely negative.   PAST MEDICAL HISTORY: Past Medical History:  Diagnosis Date  . Abdominal pain   . Allergy   . Asthma   . Bronchitis   . Cholelithiasis 06-20-2011   [place]   . Diarrhea   . GERD (gastroesophageal reflux disease)   . Hyperlipidemia   . Hypertension   . Migraines   . PONV (postoperative nausea and vomiting)   . Thyroid disease    hypothyroid  . Ulcer ([place])     PAST SURGICAL HISTORY: Past Surgical History:  Procedure Laterality Date  . ABDOMINAL HYSTERECTOMY    . CHOLECYSTECTOMY  12/25/2011   Procedure: LAPAROSCOPIC CHOLECYSTECTOMY WITH INTRAOPERATIVE CHOLANGIOGRAM;  Surgeon: Earnstine Regal, MD;  Location: [facility];  Service: General;  Laterality: N/A;  laparoscopic cholecystectomy with intraoperative cholangiogram  . COLONOSCOPY  01/10/2003   internal hemorrhoids (Dr. Lajoyce Corners)  . FOOT SURGERY     right  . hysterectomy    . ruptured disk     in neck  . UPPER GASTROINTESTINAL ENDOSCOPY  01/10/2003   hiatal hernia (Dr. Lajoyce Corners)    FAMILY HISTORY Family History  Problem Relation Age of Onset  . Asthma Father   . Colon cancer Neg Hx   . Stomach cancer Neg Hx   .  Esophageal cancer Neg Hx   . Rectal cancer Neg Hx   The patient's father died in his 30s from "old age". The mother died in her 44s with Alzheimer's disease. The patient had one brother, 2 sisters. There is no history of breast  or ovarian cancer in the family to the patient's knowledge.  GYNECOLOGIC HISTORY:  No LMP recorded. Patient has had a hysterectomy.  Menarche age 80, first live birth age 36, the patient is Erin Novak. She underwent hysterectomy in 1993. (She does not know whether the ovaries were removed are not) She did not take hormone replacement.   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 She is currently widowed and lives alone with 2 indoor N1 outdoor cats. Daughter Erin Novak lives near the patient in [place] and works at the [employer]. Daughter Erin Novak also lives in [place] and works as a Special educational needs teacher. The patient has 2 grandchildren. She is a Tourist information centre manager.     ADVANCED DIRECTIVES: Not in place   HEALTH MAINTENANCE: Social History  Substance Use Topics  . Smoking status: Never Smoker  . Smokeless tobacco: Never Used  . Alcohol use Yes     Comment: rare     Colonoscopy: October 2014/[facility]  PAP:  Bone density: January 2016   Allergies  Allergen Reactions  . Amoxicillin-Pot Clavulanate     REACTION: rash  . Amoxicillin-Pot Clavulanate     Rash  . Lisinopril Other (See Comments)    cough  . Percodan [Oxycodone-Aspirin] Nausea And Vomiting and Other (See Comments)    Sweating, unable to sleep  . Prednisone Other (See Comments)    Makes pt sweat, unable to sleep    Current Outpatient Prescriptions  Medication Sig Dispense Refill  . ALBUTEROL SULFATE HFA IN Inhale 2 puffs into the lungs every 4 (four) hours as needed.    . budesonide-formoterol (SYMBICORT) 160-4.5 MCG/ACT inhaler Inhale 2 puffs into the lungs 2 (two) times daily as needed. For shortness of breath    . Cholecalciferol (VITAMIN D-3) 5000 UNITS TABS Take 5,000 Units by mouth daily.     Marland Kitchen diltiazem (TIAZAC) 240 MG 24 hr capsule Take 240 mg by mouth every morning.     Marland Kitchen ibuprofen (ADVIL,MOTRIN) 200 MG tablet Take 400 mg by mouth every 6 (six) hours as  needed. For pain     . levothyroxine (SYNTHROID, LEVOTHROID) 88 MCG tablet Take 88 mcg by mouth every morning.     . Multiple Vitamins-Minerals (HAIR/SKIN/NAILS PO) Take 1 tablet by mouth daily.    . NON FORMULARY Inject 1 Syringe into the muscle once a week. Allergy injections weekly    . Omega-3 Fatty Acids (FISH OIL) 1200 MG CAPS Take 1,200 mg by mouth daily.      No current facility-administered medications for this visit.     OBJECTIVE: Older white woman in no acute distress Vitals:   01/28/17 1234  BP: (!) 178/81  Pulse: 83  Resp: 18  Temp: 97.7 F (36.5 C)     Body mass index is 31.37 kg/m.    ECOG FS:0 - Asymptomatic  Ocular: Sclerae unicteric, pupils equal, round and reactive to light Ear-nose-throat: Oropharynx clear and moist Lymphatic: No cervical or supraclavicular adenopathy Lungs no rales or rhonchi, good excursion bilaterally Heart regular rate and rhythm, no murmur appreciated Abd soft, nontender, positive bowel sounds MSK no focal spinal tenderness, no joint edema Neuro: non-focal, well-oriented, appropriate affect Breasts: The right breast is  unremarkable. The left breast is status post recent biopsy, with a moderate ecchymosis but no palpable mass. Both axillae are benign.   LAB RESULTS:  CMP     Component Value Date/Time   NA 143 01/28/2017 1216   K 4.3 01/28/2017 1216   CL 105 12/23/2011 1525   CO2 28 01/28/2017 1216   GLUCOSE 98 01/28/2017 1216   BUN 9.3 01/28/2017 1216   CREATININE 0.8 01/28/2017 1216   CALCIUM 9.9 01/28/2017 1216   PROT 7.8 01/28/2017 1216   ALBUMIN 4.1 01/28/2017 1216   AST 17 01/28/2017 1216   ALT 14 01/28/2017 1216   ALKPHOS 80 01/28/2017 1216   BILITOT 0.51 01/28/2017 1216   GFRNONAA 89 (L) 12/23/2011 1525   GFRAA >90 12/23/2011 1525    INo results found for: SPEP, UPEP  Lab Results  Component Value Date   WBC 7.4 01/28/2017   NEUTROABS 3.9 01/28/2017   HGB 15.0 01/28/2017   HCT 45.1 01/28/2017   MCV 90.6  01/28/2017   PLT 202 01/28/2017      Chemistry      Component Value Date/Time   NA 143 01/28/2017 1216   K 4.3 01/28/2017 1216   CL 105 12/23/2011 1525   CO2 28 01/28/2017 1216   BUN 9.3 01/28/2017 1216   CREATININE 0.8 01/28/2017 1216      Component Value Date/Time   CALCIUM 9.9 01/28/2017 1216   ALKPHOS 80 01/28/2017 1216   AST 17 01/28/2017 1216   ALT 14 01/28/2017 1216   BILITOT 0.51 01/28/2017 1216       No results found for: LABCA2  No components found for: LABCA125  No results for input(s): INR in the last 168 hours.  Urinalysis    Component Value Date/Time   COLORURINE YELLOW 12/23/2011 1436   APPEARANCEUR CLOUDY (A) 12/23/2011 1436   LABSPEC 1.017 12/23/2011 1436   PHURINE 7.5 12/23/2011 1436   GLUCOSEU NEGATIVE 12/23/2011 1436   HGBUR NEGATIVE 12/23/2011 1436   BILIRUBINUR NEGATIVE 12/23/2011 1436   KETONESUR NEGATIVE 12/23/2011 1436   PROTEINUR NEGATIVE 12/23/2011 1436   UROBILINOGEN 0.2 12/23/2011 1436   NITRITE NEGATIVE 12/23/2011 1436   LEUKOCYTESUR NEGATIVE 12/23/2011 1436     STUDIES: US Breast Ltd Uni Left Inc Axilla  Result Date: 01/20/2017 CLINICAL DATA:  Patient returns after screening study for evaluation of a possible left breast mass. EXAM: 2D DIGITAL DIAGNOSTIC LEFT MAMMOGRAM WITH ADJUNCT TOMO ULTRASOUND LEFT BREAST COMPARISON:  01/12/2017 and earlier ACR Breast Density Category b: There are scattered areas of fibroglandular density. FINDINGS: Spot 2D/3D images are performed which confirm presence of an area distortion in the lateral aspect of the left breast. A partially obscured rounded mass is identified in the upper inner quadrant of the left breast. On physical exam, I palpate no abnormality in the circumareolar region of the left breast. Targeted ultrasound is performed, showing a simple cyst in the 11 o'clock location of the left breast 3 cm from the nipple measuring 0.9 x 0.5 x 1.2 cm. In the 3 o'clock location of the left breast 1 cm  from the nipple there is an irregular hypoechoic mass which is taller than wide and measures 1.0 x 0.9 x 0.6 cm. Evaluation of the left axilla is negative for adenopathy. IMPRESSION: 1. Suspicious mass in the 3 o'clock location of the left breast 1 cm from the nipple for which biopsy is recommended. 2. Benign cyst in the 11 o'clock location of the left breast. 3. No imaging evidence for  adenopathy. RECOMMENDATION: Ultrasound-guided core biopsy is recommended and will be performed later today at the request of the patient. I have discussed the findings and recommendations with the patient. Results were also provided in writing at the conclusion of the visit. If applicable, a reminder letter will be sent to the patient regarding the next appointment. BI-RADS CATEGORY  4: Suspicious. Electronically Signed   By: Nolon Nations M.D.   On: 01/20/2017 12:08   Mm Diag Breast Tomo Uni Left  Result Date: 01/20/2017 CLINICAL DATA:  Patient returns after screening study for evaluation of a possible left breast mass. EXAM: 2D DIGITAL DIAGNOSTIC LEFT MAMMOGRAM WITH ADJUNCT TOMO ULTRASOUND LEFT BREAST COMPARISON:  01/12/2017 and earlier ACR Breast Density Category b: There are scattered areas of fibroglandular density. FINDINGS: Spot 2D/3D images are performed which confirm presence of an area distortion in the lateral aspect of the left breast. A partially obscured rounded mass is identified in the upper inner quadrant of the left breast. On physical exam, I palpate no abnormality in the circumareolar region of the left breast. Targeted ultrasound is performed, showing a simple cyst in the 11 o'clock location of the left breast 3 cm from the nipple measuring 0.9 x 0.5 x 1.2 cm. In the 3 o'clock location of the left breast 1 cm from the nipple there is an irregular hypoechoic mass which is taller than wide and measures 1.0 x 0.9 x 0.6 cm. Evaluation of the left axilla is negative for adenopathy. IMPRESSION: 1. Suspicious mass  in the 3 o'clock location of the left breast 1 cm from the nipple for which biopsy is recommended. 2. Benign cyst in the 11 o'clock location of the left breast. 3. No imaging evidence for adenopathy. RECOMMENDATION: Ultrasound-guided core biopsy is recommended and will be performed later today at the request of the patient. I have discussed the findings and recommendations with the patient. Results were also provided in writing at the conclusion of the visit. If applicable, a reminder letter will be sent to the patient regarding the next appointment. BI-RADS CATEGORY  4: Suspicious. Electronically Signed   By: Nolon Nations M.D.   On: 01/20/2017 12:08   Mm Screening Breast Tomo Bilateral  Addendum Date: 01/20/2017   ADDENDUM REPORT: 01/20/2017 16:32 ADDENDUM: Corrected report: The area of concern is within the left breast. Right breast is negative. Electronically Signed   By: Nolon Nations M.D.   On: 01/20/2017 16:32   Result Date: 01/20/2017 CLINICAL DATA:  Screening. EXAM: 2D DIGITAL SCREENING BILATERAL MAMMOGRAM WITH CAD AND ADJUNCT TOMO COMPARISON:  Previous exam(s). ACR Breast Density Category b: There are scattered areas of fibroglandular density. FINDINGS: In the right breast, a possible mass warrants further evaluation. In the left breast, no findings suspicious for malignancy. Images were processed with CAD. IMPRESSION: Further evaluation is suggested for possible mass in the right breast. RECOMMENDATION: Diagnostic mammogram and possibly ultrasound of the right breast. (Code:[id number]) The patient will be contacted regarding the findings, and additional imaging will be scheduled. BI-RADS CATEGORY  0: Incomplete. Need additional imaging evaluation and/or prior mammograms for comparison. Electronically Signed: By: Lajean Manes M.D. On: 01/13/2017 13:13   Mm Clip Placement Left  Result Date: 01/20/2017 CLINICAL DATA:  Status post ultrasound-guided core biopsy of mass in the 3 o'clock location of  the left breast. EXAM: DIAGNOSTIC LEFT MAMMOGRAM POST ULTRASOUND BIOPSY COMPARISON:  Previous exam(s). FINDINGS: Mammographic images were obtained following ultrasound guided biopsy of mass in the 3 o'clock location of the left  breast. A coil shaped clip is identified in the area of of distortion in the lateral aspect of the left breast. IMPRESSION: Tissue marker clip in the expected location following biopsy. Final Assessment: Post Procedure Mammograms for Marker Placement Electronically Signed   By: Nolon Nations M.D.   On: 01/20/2017 13:52   [place] Lt Breast Bx W Loc Dev 1st Lesion Img Bx Spec US Guide  Addendum Date: 01/21/2017   ADDENDUM REPORT: 01/21/2017 13:21 ADDENDUM: Pathology revealed GRADE II INVASIVE MAMMARY CARCINOMA of the Left breast, 3 o'clock. This was found to be concordant by Dr. Nolon Nations. Pathology results were discussed with the patient by telephone. The patient reported doing well after the biopsy with tenderness at the site. Post biopsy instructions and care were reviewed and questions were answered. The patient was encouraged to call [facility] for any additional concerns. The patient was referred to [facility] on January 28, 2017. Pathology results reported by Terie Purser, RN on 01/21/2017. Electronically Signed   By: Nolon Nations M.D.   On: 01/21/2017 13:21   Result Date: 01/21/2017 CLINICAL DATA:  Patient presents for ultrasound-guided core biopsy of mass in the 3 o'clock location of the left breast. EXAM: ULTRASOUND GUIDED LEFT BREAST CORE NEEDLE BIOPSY COMPARISON:  Previous exam(s). FINDINGS: I met with the patient and we discussed the procedure of ultrasound-guided biopsy, including benefits and alternatives. We discussed the high likelihood of a successful procedure. We discussed the risks of the procedure, including infection, bleeding, tissue injury, clip migration,  and inadequate sampling. Informed written consent was given. The usual time-out protocol was performed immediately prior to the procedure. Using sterile technique and 1% Lidocaine as local anesthetic, under direct ultrasound visualization, a 12 gauge spring-loaded device was used to perform biopsy of mass in the 3 o'clock location of the left breast using a lateral approach. At the conclusion of the procedure a coil shaped tissue marker clip was deployed into the biopsy cavity. Follow up 2 view mammogram was performed and dictated separately. IMPRESSION: Ultrasound guided biopsy of left breast mass. No apparent complications. Electronically Signed: By: Nolon Nations M.D. On: 01/20/2017 13:43    ELIGIBLE FOR AVAILABLE RESEARCH PROTOCOL: no  ASSESSMENT: 76 y.o. [place] woman status post left breast upper outer quadrant biopsy 01/20/2017 for a clinical T1c N0, stage IA invasive lobular breast cancer, E-cadherin negative, estrogen and progesterone receptor strongly positive, with an MIB-1 of 15% and no HER-2 amplification  (1) breast conserving surgery with sentinel lymph node sampling planned  (2) likely no chemotherapy and no Oncotype or less the tumor proves larger than expected at surgery  (3) adjuvant radiation as appropriate  Anti-estrogens to follow the completion of local treatment.  PLAN: We spent the better part of today's hour-long appointment discussing the biology of breast cancer in general, and the specifics of the patient's tumor in particular. We first reviewed the fact that cancer is not one disease but more than 100 different diseases and that it is important to keep them separate-- otherwise when friends and relatives discuss their own cancer experiences with [facility] confusion can result. Similarly we explained that if breast cancer spreads to the bone or liver, the patient would not have bone cancer or liver cancer, but breast cancer in the bone and breast cancer in the liver:  one cancer in three places-- not 3 different cancers which otherwise would have to be treated in 3  different ways.  We discussed the difference between local and systemic therapy. In terms of loco-regional treatment, lumpectomy plus radiation is equivalent to mastectomy as far as survival is concerned. For this reason, and because the cosmetic results are generally superior, we recommend breast conserving surgery.   We then discussed the rationale for systemic therapy. There is some risk that this cancer may have already spread to other parts of her body. Patients frequently ask at this point about bone scans, CAT scans and PET scans to find out if they have occult breast cancer somewhere else. The problem is that in early stage disease we are much more likely to find false positives then true cancers and this would expose the patient to unnecessary procedures as well as unnecessary radiation. Scans cannot answer the question the patient really would like to know, which is whether she has microscopic disease elsewhere in her body. For those reasons we do not recommend them.  Of course we would proceed to aggressive evaluation of any symptoms that might suggest metastatic disease, but that is not the case here.  Next we went over the options for systemic therapy which are anti-estrogens, anti-HER-2 immunotherapy, and chemotherapy. Donnarae does not meet criteria for anti-HER-2 immunotherapy. She is a good candidate for anti-estrogens.  The question of chemotherapy is more complicated. Chemotherapy is most effective in rapidly growing, aggressive tumors. It is much less effective in not very aggressive, slow growing cancers, like Velta Addison 's. For that reason we do not anticipate chemotherapy would significantly lower the patient's recurrence risk. Of course if it turns out the final pathology shows a different picture we would request an Oncotype from the definitive surgical sample, as suggested by NCCN  guidelines. If today's discussion though we emphasized that chemotherapy was likely not in the picture  The overall plan then is to start with surgery, discuss radiation, and then return to see me for anti-estrogen therapy, which likely will be and anastrozole  Velta Addison has a good understanding of the overall plan. She agrees with it. She knows the goal of treatment in her case is cure. She will call with any problems that may develop before her next visit here.  Chauncey Cruel, MD   01/28/2017 3:15 PM Medical Oncology and Hematology [facility] [address] [place], [place] [address] Tel. [phone]    Fax. [phone]

## 2017-01-28 NOTE — Progress Notes (Unsigned)
Nutrition Assessment  Reason for Assessment:  Pt seen in Breast Clinic  ASSESSMENT:   76 year old female with new diagnosis of left breast cancer. Past medical reviewed.  Medications:  reviewed  Labs: reviewed  Anthropometrics:   Height: 63 inches Weight: 177 pounds BMI: 31.4   NUTRITION DIAGNOSIS: Food and nutrition related knowledge deficit related to new diagnosis of breast cancer as evidenced by no prior need for nutrition related information.  INTERVENTION:   Provided packet of information regarding nutritional tips for breast cancer patients. Patient ready to leave clinic.  Contact information provided and patient knows to contact me with questions/concerns.    MONITORING, EVALUATION, and GOAL: Pt will consume a healthy plant based diet to maintain lean body mass throughout treatment.   Tomer Chalmers B. Zenia Resides, [place], [place] Registered Dietitian [phone] (pager)

## 2017-01-28 NOTE — Progress Notes (Signed)
[facility] Psychosocial Distress Screening Spiritual Care  Met with Erin Novak and her daughter Erin Novak in [facility] to introduce [place] team/resources, reviewing distress screen per protocol.  The patient scored a 1 on the Psychosocial Distress Thermometer which indicates mild distress. Also assessed for distress and other psychosocial needs.   ONCBCN DISTRESS SCREENING 01/28/2017  Screening Type Initial Screening  Distress experienced in past week (1-10) 1  Emotional problem type Adjusting to illness  Information Concerns Type Lack of info about complementary therapy choices  Referral to support programs Yes   Erin Novak and Erin Novak were eager to complete [facility].  [place] team/resource packet with them, normalizing value of support/community/reflection opportunities and encouraging them to reach out anytime.   Follow up needed: No.  At this time, pt states that she has no other needs or concerns.  She is aware of ongoing Support Team availability, but please also page if immediate needs arise.  Thank you.   [place], [place], [facility] Pager [phone] Voicemail [phone]

## 2017-01-29 ENCOUNTER — Ambulatory Visit
Admission: RE | Admit: 2017-01-29 | Discharge: 2017-01-29 | Disposition: A | Discharge status: Home / Self Care | Payer: Medicare Other | Source: Ambulatory Visit | Attending: General Surgery | Admitting: General Surgery

## 2017-01-29 DIAGNOSIS — C50412 Malignant neoplasm of upper-outer quadrant of left female breast: Secondary | ICD-10-CM

## 2017-01-29 DIAGNOSIS — Z170 Estrogen receptor positive status [ER+]: Principal | ICD-10-CM

## 2017-01-29 DIAGNOSIS — J3081 Allergic rhinitis due to animal (cat) (dog) hair and dander: Secondary | ICD-10-CM | POA: Diagnosis not present

## 2017-01-29 DIAGNOSIS — J3089 Other allergic rhinitis: Secondary | ICD-10-CM | POA: Diagnosis not present

## 2017-01-29 DIAGNOSIS — C50912 Malignant neoplasm of unspecified site of left female breast: Secondary | ICD-10-CM | POA: Diagnosis not present

## 2017-01-29 MED ORDER — GADOBENATE DIMEGLUMINE 529 MG/ML IV SOLN
16.0000 mL | Freq: Once | INTRAVENOUS | Status: AC | PRN
  Administered 2017-01-29: 16 mL via INTRAVENOUS

## 2017-02-02 ENCOUNTER — Other Ambulatory Visit: Payer: Self-pay | Admitting: General Surgery

## 2017-02-02 ENCOUNTER — Telehealth: Payer: Self-pay | Admitting: *Deleted

## 2017-02-02 DIAGNOSIS — C50412 Malignant neoplasm of upper-outer quadrant of left female breast: Secondary | ICD-10-CM

## 2017-02-02 DIAGNOSIS — Z170 Estrogen receptor positive status [ER+]: Principal | ICD-10-CM

## 2017-02-02 NOTE — Telephone Encounter (Signed)
Left vm regarding [facility] from 3.14.18. Contact information provided for questions or concerns.

## 2017-02-02 NOTE — Telephone Encounter (Signed)
Spoke to pt concerning [place] from 3.14.18. Denies questions or concern regarding dx or treatment careplan. Encourage pt to call with needs. Received verbal understanding.

## 2017-02-06 DIAGNOSIS — J3081 Allergic rhinitis due to animal (cat) (dog) hair and dander: Secondary | ICD-10-CM | POA: Diagnosis not present

## 2017-02-06 DIAGNOSIS — L03032 Cellulitis of left toe: Secondary | ICD-10-CM | POA: Diagnosis not present

## 2017-02-06 DIAGNOSIS — J3089 Other allergic rhinitis: Secondary | ICD-10-CM | POA: Diagnosis not present

## 2017-02-09 ENCOUNTER — Ambulatory Visit
Admission: RE | Admit: 2017-02-09 | Discharge: 2017-02-09 | Disposition: A | Discharge status: Home / Self Care | Payer: Medicare Other | Source: Ambulatory Visit | Attending: General Surgery | Admitting: General Surgery

## 2017-02-09 ENCOUNTER — Other Ambulatory Visit: Payer: Self-pay | Admitting: General Surgery

## 2017-02-09 DIAGNOSIS — N6012 Diffuse cystic mastopathy of left breast: Secondary | ICD-10-CM | POA: Diagnosis not present

## 2017-02-09 DIAGNOSIS — C50412 Malignant neoplasm of upper-outer quadrant of left female breast: Secondary | ICD-10-CM

## 2017-02-09 DIAGNOSIS — Z170 Estrogen receptor positive status [ER+]: Principal | ICD-10-CM

## 2017-02-09 DIAGNOSIS — N6322 Unspecified lump in the left breast, upper inner quadrant: Secondary | ICD-10-CM | POA: Diagnosis not present

## 2017-02-09 DIAGNOSIS — N6324 Unspecified lump in the left breast, lower inner quadrant: Secondary | ICD-10-CM | POA: Diagnosis not present

## 2017-02-09 MED ORDER — GADOBENATE DIMEGLUMINE 529 MG/ML IV SOLN
16.0000 mL | Freq: Once | INTRAVENOUS | Status: AC | PRN
  Administered 2017-02-09: 16 mL via INTRAVENOUS

## 2017-02-11 ENCOUNTER — Other Ambulatory Visit: Payer: Self-pay | Admitting: General Surgery

## 2017-02-11 ENCOUNTER — Ambulatory Visit
Admission: RE | Admit: 2017-02-11 | Discharge: 2017-02-11 | Disposition: A | Discharge status: Home / Self Care | Payer: Medicare Other | Source: Ambulatory Visit | Attending: General Surgery | Admitting: General Surgery

## 2017-02-11 DIAGNOSIS — R928 Other abnormal and inconclusive findings on diagnostic imaging of breast: Secondary | ICD-10-CM | POA: Diagnosis not present

## 2017-02-11 DIAGNOSIS — J3089 Other allergic rhinitis: Secondary | ICD-10-CM | POA: Diagnosis not present

## 2017-02-11 DIAGNOSIS — N6320 Unspecified lump in the left breast, unspecified quadrant: Secondary | ICD-10-CM | POA: Diagnosis not present

## 2017-02-11 DIAGNOSIS — Z170 Estrogen receptor positive status [ER+]: Principal | ICD-10-CM

## 2017-02-11 DIAGNOSIS — C50412 Malignant neoplasm of upper-outer quadrant of left female breast: Secondary | ICD-10-CM | POA: Diagnosis not present

## 2017-02-11 DIAGNOSIS — Z6831 Body mass index (BMI) 31.0-31.9, adult: Secondary | ICD-10-CM | POA: Diagnosis not present

## 2017-02-11 DIAGNOSIS — J3081 Allergic rhinitis due to animal (cat) (dog) hair and dander: Secondary | ICD-10-CM | POA: Diagnosis not present

## 2017-02-11 DIAGNOSIS — N6322 Unspecified lump in the left breast, upper inner quadrant: Secondary | ICD-10-CM | POA: Diagnosis not present

## 2017-02-11 DIAGNOSIS — Z9049 Acquired absence of other specified parts of digestive tract: Secondary | ICD-10-CM | POA: Diagnosis not present

## 2017-02-11 DIAGNOSIS — I10 Essential (primary) hypertension: Secondary | ICD-10-CM | POA: Diagnosis not present

## 2017-02-11 DIAGNOSIS — J45909 Unspecified asthma, uncomplicated: Secondary | ICD-10-CM | POA: Diagnosis not present

## 2017-02-11 DIAGNOSIS — Z8719 Personal history of other diseases of the digestive system: Secondary | ICD-10-CM | POA: Diagnosis not present

## 2017-02-11 DIAGNOSIS — Z9889 Other specified postprocedural states: Secondary | ICD-10-CM | POA: Diagnosis not present

## 2017-02-18 DIAGNOSIS — J3081 Allergic rhinitis due to animal (cat) (dog) hair and dander: Secondary | ICD-10-CM | POA: Diagnosis not present

## 2017-02-18 DIAGNOSIS — J3089 Other allergic rhinitis: Secondary | ICD-10-CM | POA: Diagnosis not present

## 2017-02-20 ENCOUNTER — Encounter: Payer: Self-pay | Admitting: Radiation Oncology

## 2017-02-23 ENCOUNTER — Encounter (HOSPITAL_BASED_OUTPATIENT_CLINIC_OR_DEPARTMENT_OTHER): Payer: Self-pay | Admitting: *Deleted

## 2017-02-23 ENCOUNTER — Other Ambulatory Visit: Payer: Self-pay | Admitting: General Surgery

## 2017-02-23 DIAGNOSIS — Z170 Estrogen receptor positive status [ER+]: Principal | ICD-10-CM

## 2017-02-23 DIAGNOSIS — C50412 Malignant neoplasm of upper-outer quadrant of left female breast: Secondary | ICD-10-CM

## 2017-02-23 NOTE — Progress Notes (Signed)
Bring all medications. Coming Wednesday for BMET , EKG and to pick up Boost drink.

## 2017-02-25 ENCOUNTER — Encounter (HOSPITAL_BASED_OUTPATIENT_CLINIC_OR_DEPARTMENT_OTHER)
Admission: RE | Admit: 2017-02-25 | Discharge: 2017-02-25 | Disposition: A | Discharge status: Home / Self Care | Payer: Medicare Other | Source: Ambulatory Visit | Attending: General Surgery | Admitting: General Surgery

## 2017-02-25 DIAGNOSIS — I10 Essential (primary) hypertension: Secondary | ICD-10-CM | POA: Insufficient documentation

## 2017-02-25 DIAGNOSIS — R9431 Abnormal electrocardiogram [ECG] [EKG]: Secondary | ICD-10-CM | POA: Insufficient documentation

## 2017-02-25 DIAGNOSIS — Z01810 Encounter for preprocedural cardiovascular examination: Secondary | ICD-10-CM | POA: Insufficient documentation

## 2017-02-25 DIAGNOSIS — J3089 Other allergic rhinitis: Secondary | ICD-10-CM | POA: Diagnosis not present

## 2017-02-25 DIAGNOSIS — J3081 Allergic rhinitis due to animal (cat) (dog) hair and dander: Secondary | ICD-10-CM | POA: Diagnosis not present

## 2017-02-25 LAB — BASIC METABOLIC PANEL
ANION GAP: 9 (ref 5–15)
BUN: 10 mg/dL (ref 6–20)
CALCIUM: 9.3 mg/dL (ref 8.9–10.3)
CO2: 27 mmol/L (ref 22–32)
Chloride: 104 mmol/L (ref 101–111)
Creatinine, Ser: 0.7 mg/dL (ref 0.44–1.00)
GFR calc Af Amer: >60 mL/min (ref >60)
GFR calc non Af Amer: >60 mL/min (ref >60)
GLUCOSE: 101 mg/dL — AB (ref 65–99)
POTASSIUM: 4.3 mmol/L (ref 3.5–5.1)
Sodium: 140 mmol/L (ref 135–145)

## 2017-02-25 NOTE — Progress Notes (Signed)
Pt in for PAT,  Labs drawn and boost drink given to pt and explained.  EKG reviewed by Dr. Conrad [place].

## 2017-02-26 ENCOUNTER — Ambulatory Visit
Admission: RE | Admit: 2017-02-26 | Discharge: 2017-02-26 | Disposition: A | Discharge status: Home / Self Care | Payer: Medicare Other | Source: Ambulatory Visit | Attending: General Surgery | Admitting: General Surgery

## 2017-02-26 DIAGNOSIS — C50412 Malignant neoplasm of upper-outer quadrant of left female breast: Secondary | ICD-10-CM

## 2017-02-26 DIAGNOSIS — Z170 Estrogen receptor positive status [ER+]: Principal | ICD-10-CM

## 2017-02-26 DIAGNOSIS — C50912 Malignant neoplasm of unspecified site of left female breast: Secondary | ICD-10-CM | POA: Diagnosis not present

## 2017-02-28 NOTE — H&P (Signed)
[name] Location: [facility] Patient #: [id number] DOB: [birth year] Married / Language: English / Race: White Female        History of Present Illness       The patient is a 76 year old female who presents with breast cancer. This is a 76 year old female who returns to discuss management of the cancer in her left breast at the 3 o'clock position and a second area of distortion in the left breast at the 9 o'clock position. She is from [facility]. Her daughter is with her throughout the encounter. She was initially evaluated in the [facility] following image guided biopsies of her left breast, 1 cm mass, 3 o'clock position by the BCG. Dr. [name] is her PCP. Dr. Radene Journey is her gynecologist. Drs. Magrinat and Lisbeth Renshaw are involved in her care.       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 Axillary ultrasound was negative.      MRI was performed, she has a lobular histology. The MRI showed a spiculated enhancing mass at both the 3 o'clock position where the known cancer is and also a small spiculated enhancing mass of the left breast at the 9 o'clock position. Image guided biopsy of the 9 o'clock position was performed earlier this week and shows fibrocystic changes, usual ductal hyperplasia, metaplasia, calcifications but no atypia. Dr. Franki Cabot states that this is discordant and that this area needs to be excised. I agree. We would like to avoid mastectomy. I think that double lumpectomy with double radioactive seed is reasonable.      Comorbidities include hypertension, GERD, asthma, hysterectomy, cholecystectomy, mild obesity Family history is negative for breast or ovarian cancer. Mother died at than dementia. Father died with COPD Social history reveals she is a widow. 2 daughters live nearby. Lives  in [place]. Denies tobacco. Takes alcohol rarely.       She will be scheduled for left breast lumpectomy 2 with left radioactive seed localization 2. Left axillary deep sentinel lymph node biopsy and injection of blue dye. I discussed the indications, details, techniques, and numerous risk of the surgery with her and her daughter. She is aware of the risk of bleeding, infection, cosmetic deformity, arm swelling, arm numbness, reoperation for positive margins or positive nodes. She understands all these issues well. All of her questions are answered. She agrees with this plan.   Allergies  Amoxil *PENICILLINS*  Percodan *ANALGESICS - OPIOID*  PredniSONE *CORTICOSTEROIDS*   Medication History Albuterol (90MCG/ACT Aerosol Soln, Inhalation) Active. Symbicort (160-4.5MCG/ACT Aerosol, Inhalation) Active. Vitamin D3 (5000UNIT Tablet, Oral) Active. DilTIAZem CD ('240MG'$  Capsule ER 24HR, Oral) Active. Ibuprofen ('400MG'$  Tablet, Oral) Active. Levothyroxine Sodium (88MCG Tablet, Oral) Active. Multiple Vitamin (1 (one) Oral) Active. Fish Oil ('1000MG'$  Capsule, Oral) Active. Medications Reconciled  Vitals  Weight: 175 lb Height: 63in Body Surface Area: 1.83 m Body Mass Index: 31 kg/m  Pulse: 86 (Regular)  BP: 126/74 (Sitting, Left Arm, Standard)    Physical Exam  General Mental Status-Alert. General Appearance-Not in acute distress. Build & Nutrition-Well nourished. Posture-Normal posture. Gait-Normal.  Head and Neck Head-normocephalic, atraumatic with no lesions or palpable masses. Trachea-midline. Thyroid Gland Characteristics - normal size and consistency and no palpable nodules.  Chest and Lung Exam Chest and lung exam reveals -on auscultation, normal breath sounds, no adventitious sounds  and normal vocal resonance.  Breast Note: Ecchymoses without mass left breast, 9 o'clock position. No other mass in either breast. No axillary  adenopathy.   Cardiovascular Cardiovascular examination reveals -normal heart sounds, regular rate and rhythm with no murmurs and femoral artery auscultation bilaterally reveals normal pulses, no bruits, no thrills.  Abdomen Inspection Inspection of the abdomen reveals - No Hernias. Palpation/Percussion Palpation and Percussion of the abdomen reveal - Soft, Non Tender, No Rigidity (guarding), No hepatosplenomegaly and No Palpable abdominal masses.  Neurologic Neurologic evaluation reveals -alert and oriented x 3 with no impairment of recent or remote memory, normal attention span and ability to concentrate, normal sensation and normal coordination.  Musculoskeletal Normal Exam - Bilateral-Upper Extremity Strength Normal and Lower Extremity Strength Normal.    Assessment & Plan PRIMARY CANCER OF UPPER OUTER QUADRANT OF LEFT FEMALE BREAST (C50.412)    Your recent MRI shows the cancer in the left breast at the 3 o'clock position It also showed a highly suspicious spiculated mass in the left breast at the 9 o'clock position The biopsy of the mass in the 9 o'clock position showed benign fibrocystic changes and hyperplasia The radiologist states that this is discordant, he is still suspicious, and wants this area removed I have reviewed all of the imaging studies with Dr. Franki Cabot, and I agree with him.  While mastectomy is one option, I do not think that it offers any advantage or benefit in your situation. You have stated that he would like to breast conservation if possible, and I think that is reasonable. you will be scheduled for left breast lumpectomy 2 with radioactive seed localization 2 and left axillary sentinel lymph node biopsy We have discussed the indications, techniques, and risk of all of this surgery in detail  ABNORMAL MAMMOGRAM OF LEFT BREAST (R92.8) Impression: 9 o'clock position HISTORY OF LAPAROSCOPIC CHOLECYSTECTOMY (Z98.890) BMI 31.0-31.9,ADULT  (Z68.31) HISTORY OF DUODENAL ULCER (Z87.19) ASTHMA IN ADULT (J45.909) HYPERTENSION, BENIGN (I10)    Elfreda Blanchet M. Dalbert Batman, M.D., [facility] General and Minimally invasive Surgery Breast and Colorectal Surgery Office:   [phone] Pager:   [phone]

## 2017-03-03 ENCOUNTER — Ambulatory Visit
Admission: RE | Admit: 2017-03-03 | Discharge: 2017-03-03 | Disposition: A | Discharge status: Home / Self Care | Payer: Medicare Other | Source: Ambulatory Visit | Attending: General Surgery | Admitting: General Surgery

## 2017-03-03 ENCOUNTER — Encounter (HOSPITAL_BASED_OUTPATIENT_CLINIC_OR_DEPARTMENT_OTHER)
Admission: RE | Disposition: A | Discharge status: Home / Self Care | Payer: Self-pay | Source: Ambulatory Visit | Attending: General Surgery

## 2017-03-03 ENCOUNTER — Ambulatory Visit (HOSPITAL_BASED_OUTPATIENT_CLINIC_OR_DEPARTMENT_OTHER)
Admission: RE | Admit: 2017-03-03 | Discharge: 2017-03-04 | Disposition: A | Discharge status: Home / Self Care | Payer: Medicare Other | Source: Ambulatory Visit | Attending: General Surgery | Admitting: General Surgery

## 2017-03-03 ENCOUNTER — Ambulatory Visit (HOSPITAL_BASED_OUTPATIENT_CLINIC_OR_DEPARTMENT_OTHER): Payer: Medicare Other | Admitting: Anesthesiology

## 2017-03-03 ENCOUNTER — Encounter (HOSPITAL_BASED_OUTPATIENT_CLINIC_OR_DEPARTMENT_OTHER): Payer: Self-pay

## 2017-03-03 ENCOUNTER — Ambulatory Visit (HOSPITAL_COMMUNITY)
Admission: RE | Admit: 2017-03-03 | Discharge: 2017-03-03 | Disposition: A | Discharge status: Home / Self Care | Payer: Medicare Other | Source: Ambulatory Visit | Attending: General Surgery | Admitting: General Surgery

## 2017-03-03 DIAGNOSIS — Z9889 Other specified postprocedural states: Secondary | ICD-10-CM | POA: Diagnosis present

## 2017-03-03 DIAGNOSIS — Z6831 Body mass index (BMI) 31.0-31.9, adult: Secondary | ICD-10-CM | POA: Diagnosis not present

## 2017-03-03 DIAGNOSIS — C50412 Malignant neoplasm of upper-outer quadrant of left female breast: Secondary | ICD-10-CM

## 2017-03-03 DIAGNOSIS — R112 Nausea with vomiting, unspecified: Secondary | ICD-10-CM | POA: Diagnosis present

## 2017-03-03 DIAGNOSIS — N6092 Unspecified benign mammary dysplasia of left breast: Secondary | ICD-10-CM | POA: Diagnosis not present

## 2017-03-03 DIAGNOSIS — J45909 Unspecified asthma, uncomplicated: Secondary | ICD-10-CM | POA: Diagnosis not present

## 2017-03-03 DIAGNOSIS — I10 Essential (primary) hypertension: Secondary | ICD-10-CM | POA: Insufficient documentation

## 2017-03-03 DIAGNOSIS — R928 Other abnormal and inconclusive findings on diagnostic imaging of breast: Secondary | ICD-10-CM | POA: Diagnosis not present

## 2017-03-03 DIAGNOSIS — Z170 Estrogen receptor positive status [ER+]: Secondary | ICD-10-CM | POA: Insufficient documentation

## 2017-03-03 DIAGNOSIS — Z79899 Other long term (current) drug therapy: Secondary | ICD-10-CM | POA: Insufficient documentation

## 2017-03-03 DIAGNOSIS — N6022 Fibroadenosis of left breast: Secondary | ICD-10-CM | POA: Insufficient documentation

## 2017-03-03 DIAGNOSIS — E669 Obesity, unspecified: Secondary | ICD-10-CM | POA: Diagnosis not present

## 2017-03-03 DIAGNOSIS — N6012 Diffuse cystic mastopathy of left breast: Secondary | ICD-10-CM | POA: Diagnosis not present

## 2017-03-03 DIAGNOSIS — C50812 Malignant neoplasm of overlapping sites of left female breast: Secondary | ICD-10-CM | POA: Diagnosis not present

## 2017-03-03 DIAGNOSIS — C50912 Malignant neoplasm of unspecified site of left female breast: Secondary | ICD-10-CM | POA: Diagnosis not present

## 2017-03-03 DIAGNOSIS — G8918 Other acute postprocedural pain: Secondary | ICD-10-CM | POA: Diagnosis not present

## 2017-03-03 HISTORY — PX: BREAST LUMPECTOMY WITH RADIOACTIVE SEED AND SENTINEL LYMPH NODE BIOPSY: SHX6550

## 2017-03-03 HISTORY — DX: Hypothyroidism, unspecified: E03.9

## 2017-03-03 HISTORY — PX: BREAST LUMPECTOMY: SHX2

## 2017-03-03 SURGERY — BREAST LUMPECTOMY WITH RADIOACTIVE SEED AND SENTINEL LYMPH NODE BIOPSY
Anesthesia: Regional | Site: Breast | Laterality: Left

## 2017-03-03 MED ORDER — ONDANSETRON HCL 4 MG/2ML IJ SOLN
INTRAMUSCULAR | Status: DC | PRN
  Administered 2017-03-03 (×2): 4 mg via INTRAVENOUS

## 2017-03-03 MED ORDER — BUPIVACAINE-EPINEPHRINE (PF) 0.5% -1:200000 IJ SOLN
INTRAMUSCULAR | Status: DC | PRN
  Administered 2017-03-03: 30 mL via PERINEURAL

## 2017-03-03 MED ORDER — SUCCINYLCHOLINE CHLORIDE 200 MG/10ML IV SOSY
PREFILLED_SYRINGE | INTRAVENOUS | Status: DC | PRN
  Administered 2017-03-03: 80 mg via INTRAVENOUS

## 2017-03-03 MED ORDER — ONDANSETRON HCL 4 MG/2ML IJ SOLN
INTRAMUSCULAR | Status: AC
  Filled 2017-03-03: qty 2

## 2017-03-03 MED ORDER — BUPIVACAINE HCL (PF) 0.5 % IJ SOLN
INTRAMUSCULAR | Status: DC | PRN
Start: 2017-03-03 — End: 2017-03-03
  Administered 2017-03-03: 17 mL

## 2017-03-03 MED ORDER — HYDROMORPHONE HCL 1 MG/ML IJ SOLN
INTRAMUSCULAR | Status: AC
  Filled 2017-03-03: qty 1

## 2017-03-03 MED ORDER — PROMETHAZINE HCL 25 MG/ML IJ SOLN
INTRAMUSCULAR | Status: AC
  Filled 2017-03-03: qty 1

## 2017-03-03 MED ORDER — CEFAZOLIN SODIUM-DEXTROSE 2-4 GM/100ML-% IV SOLN
2.0000 g | INTRAVENOUS | Status: AC
  Administered 2017-03-03: 2 g via INTRAVENOUS

## 2017-03-03 MED ORDER — ONDANSETRON 4 MG PO TBDP: 4.0000 mg | ORAL_TABLET | Freq: Four times a day (QID) | ORAL | Status: DC | PRN

## 2017-03-03 MED ORDER — IBUPROFEN 400 MG PO TABS: 400.0000 mg | ORAL_TABLET | Freq: Four times a day (QID) | ORAL | Status: DC | PRN

## 2017-03-03 MED ORDER — DEXAMETHASONE SODIUM PHOSPHATE 10 MG/ML IJ SOLN
INTRAMUSCULAR | Status: AC
  Filled 2017-03-03: qty 1

## 2017-03-03 MED ORDER — SODIUM CHLORIDE 0.9% FLUSH: 3.0000 mL | INTRAVENOUS | Status: DC | PRN

## 2017-03-03 MED ORDER — SCOPOLAMINE 1 MG/3DAYS TD PT72
1.0000 | MEDICATED_PATCH | Freq: Once | TRANSDERMAL | Status: AC | PRN
  Administered 2017-03-03: 1 via TRANSDERMAL
  Administered 2017-03-03: 1.5 mg via TRANSDERMAL

## 2017-03-03 MED ORDER — ACETAMINOPHEN 500 MG PO TABS: 1000.0000 mg | ORAL_TABLET | ORAL | Status: DC

## 2017-03-03 MED ORDER — CELECOXIB 200 MG PO CAPS
ORAL_CAPSULE | ORAL | Status: AC
  Filled 2017-03-03: qty 2

## 2017-03-03 MED ORDER — LACTATED RINGERS IV SOLN: INTRAVENOUS | Status: DC

## 2017-03-03 MED ORDER — FENTANYL CITRATE (PF) 100 MCG/2ML IJ SOLN
50.0000 ug | INTRAMUSCULAR | Status: DC | PRN
  Administered 2017-03-03 (×2): 50 ug via INTRAVENOUS

## 2017-03-03 MED ORDER — DEXAMETHASONE SODIUM PHOSPHATE 4 MG/ML IJ SOLN
INTRAMUSCULAR | Status: DC | PRN
  Administered 2017-03-03: 10 mg via INTRAVENOUS

## 2017-03-03 MED ORDER — OXYCODONE HCL 5 MG/5ML PO SOLN: 5.0000 mg | Freq: Once | ORAL | Status: DC | PRN

## 2017-03-03 MED ORDER — METOCLOPRAMIDE HCL 5 MG/ML IJ SOLN
5.0000 mg | Freq: Once | INTRAMUSCULAR | Status: AC | PRN
  Administered 2017-03-03: 5 mg via INTRAVENOUS

## 2017-03-03 MED ORDER — PROPOFOL 10 MG/ML IV BOLUS
INTRAVENOUS | Status: AC
  Filled 2017-03-03: qty 20

## 2017-03-03 MED ORDER — CHLORHEXIDINE GLUCONATE CLOTH 2 % EX PADS: 6.0000 | MEDICATED_PAD | Freq: Once | CUTANEOUS | Status: DC

## 2017-03-03 MED ORDER — DILTIAZEM HCL ER BEADS 240 MG PO CP24: 240.0000 mg | ORAL_CAPSULE | ORAL | Status: DC

## 2017-03-03 MED ORDER — MIDAZOLAM HCL 2 MG/2ML IJ SOLN
INTRAMUSCULAR | Status: AC
  Filled 2017-03-03: qty 2

## 2017-03-03 MED ORDER — ACETAMINOPHEN 650 MG RE SUPP: 650.0000 mg | RECTAL | Status: DC | PRN

## 2017-03-03 MED ORDER — LEVOTHYROXINE SODIUM 88 MCG PO TABS: 88.0000 ug | ORAL_TABLET | ORAL | Status: DC

## 2017-03-03 MED ORDER — PROPOFOL 10 MG/ML IV BOLUS
INTRAVENOUS | Status: DC | PRN
  Administered 2017-03-03: 140 mg via INTRAVENOUS
  Administered 2017-03-03: 30 mg via INTRAVENOUS

## 2017-03-03 MED ORDER — HYDROMORPHONE HCL 1 MG/ML IJ SOLN
0.2500 mg | INTRAMUSCULAR | Status: DC | PRN
  Administered 2017-03-03: 0.25 mg via INTRAVENOUS
  Administered 2017-03-03: 0.5 mg via INTRAVENOUS

## 2017-03-03 MED ORDER — GABAPENTIN 300 MG PO CAPS: 300.0000 mg | ORAL_CAPSULE | ORAL | Status: AC

## 2017-03-03 MED ORDER — OXYCODONE HCL 5 MG PO TABS: 5.0000 mg | ORAL_TABLET | Freq: Once | ORAL | Status: DC | PRN

## 2017-03-03 MED ORDER — CELECOXIB 400 MG PO CAPS: 400.0000 mg | ORAL_CAPSULE | ORAL | Status: DC

## 2017-03-03 MED ORDER — OXYCODONE HCL 5 MG PO TABS: 5.0000 mg | ORAL_TABLET | ORAL | Status: DC | PRN

## 2017-03-03 MED ORDER — MIDAZOLAM HCL 2 MG/2ML IJ SOLN
1.0000 mg | INTRAMUSCULAR | Status: DC | PRN
  Administered 2017-03-03: 1 mg via INTRAVENOUS

## 2017-03-03 MED ORDER — FENTANYL CITRATE (PF) 100 MCG/2ML IJ SOLN: 25.0000 ug | INTRAMUSCULAR | Status: DC | PRN

## 2017-03-03 MED ORDER — SODIUM CHLORIDE 0.9 % IV SOLN: 250.0000 mL | INTRAVENOUS | Status: DC | PRN

## 2017-03-03 MED ORDER — ACETAMINOPHEN 500 MG PO TABS
1000.0000 mg | ORAL_TABLET | ORAL | Status: AC
  Administered 2017-03-03: 1000 mg via ORAL

## 2017-03-03 MED ORDER — ACETAMINOPHEN 325 MG PO TABS
650.0000 mg | ORAL_TABLET | ORAL | Status: DC | PRN
Start: 2017-03-03 — End: 2017-03-04

## 2017-03-03 MED ORDER — LACTATED RINGERS IV SOLN
INTRAVENOUS | Status: DC
  Administered 2017-03-03: 12:00:00 via INTRAVENOUS
  Administered 2017-03-03: 10 mL/h via INTRAVENOUS
  Administered 2017-03-03 (×2): via INTRAVENOUS

## 2017-03-03 MED ORDER — PROMETHAZINE HCL 25 MG/ML IJ SOLN: 12.5000 mg | Freq: Four times a day (QID) | INTRAMUSCULAR | Status: DC | PRN

## 2017-03-03 MED ORDER — PROMETHAZINE HCL 25 MG/ML IJ SOLN
6.2500 mg | INTRAMUSCULAR | Status: DC | PRN
  Administered 2017-03-03: 6.25 mg via INTRAVENOUS

## 2017-03-03 MED ORDER — ONDANSETRON HCL 4 MG/2ML IJ SOLN: 4.0000 mg | Freq: Four times a day (QID) | INTRAMUSCULAR | Status: DC | PRN

## 2017-03-03 MED ORDER — METOCLOPRAMIDE HCL 5 MG/ML IJ SOLN
INTRAMUSCULAR | Status: AC
  Filled 2017-03-03: qty 2

## 2017-03-03 MED ORDER — SODIUM CHLORIDE 0.9 % IJ SOLN
INTRAVENOUS | Status: DC | PRN
  Administered 2017-03-03: 5 mL via INTRAMUSCULAR

## 2017-03-03 MED ORDER — MEPERIDINE HCL 25 MG/ML IJ SOLN: 6.2500 mg | INTRAMUSCULAR | Status: DC | PRN

## 2017-03-03 MED ORDER — TRAMADOL HCL 50 MG PO TABS: 50.0000 mg | ORAL_TABLET | Freq: Four times a day (QID) | ORAL | 0 refills | Status: DC | PRN

## 2017-03-03 MED ORDER — PROPOFOL 500 MG/50ML IV EMUL
INTRAVENOUS | Status: DC | PRN
  Administered 2017-03-03: 75 ug/kg/min via INTRAVENOUS

## 2017-03-03 MED ORDER — SODIUM CHLORIDE 0.9% FLUSH: 3.0000 mL | Freq: Two times a day (BID) | INTRAVENOUS | Status: DC

## 2017-03-03 MED ORDER — TECHNETIUM TC 99M SULFUR COLLOID FILTERED
1.0000 | Freq: Once | INTRAVENOUS | Status: AC | PRN
  Administered 2017-03-03: 1 via INTRADERMAL

## 2017-03-03 MED ORDER — FENTANYL CITRATE (PF) 100 MCG/2ML IJ SOLN
INTRAMUSCULAR | Status: AC
  Filled 2017-03-03: qty 2

## 2017-03-03 MED ORDER — MOMETASONE FURO-FORMOTEROL FUM 200-5 MCG/ACT IN AERO: 2.0000 | INHALATION_SPRAY | Freq: Two times a day (BID) | RESPIRATORY_TRACT | Status: DC | PRN

## 2017-03-03 MED ORDER — CELECOXIB 400 MG PO CAPS
400.0000 mg | ORAL_CAPSULE | ORAL | Status: AC
  Administered 2017-03-03: 400 mg via ORAL

## 2017-03-03 MED ORDER — GABAPENTIN 300 MG PO CAPS
ORAL_CAPSULE | ORAL | Status: AC
  Filled 2017-03-03: qty 1

## 2017-03-03 MED ORDER — LIDOCAINE 2% (20 MG/ML) 5 ML SYRINGE
INTRAMUSCULAR | Status: DC | PRN
  Administered 2017-03-03: 60 mg via INTRAVENOUS

## 2017-03-03 MED ORDER — CEFAZOLIN SODIUM-DEXTROSE 2-4 GM/100ML-% IV SOLN
INTRAVENOUS | Status: AC
  Filled 2017-03-03: qty 100

## 2017-03-03 MED ORDER — SCOPOLAMINE 1 MG/3DAYS TD PT72
MEDICATED_PATCH | TRANSDERMAL | Status: AC
  Filled 2017-03-03: qty 1

## 2017-03-03 MED ORDER — GABAPENTIN 300 MG PO CAPS
300.0000 mg | ORAL_CAPSULE | ORAL | Status: DC
Start: 2017-03-03 — End: 2017-03-03

## 2017-03-03 MED ORDER — ALBUTEROL SULFATE HFA 108 (90 BASE) MCG/ACT IN AERS: 2.0000 | INHALATION_SPRAY | Freq: Four times a day (QID) | RESPIRATORY_TRACT | Status: DC | PRN

## 2017-03-03 MED ORDER — ACETAMINOPHEN 500 MG PO TABS
ORAL_TABLET | ORAL | Status: AC
  Filled 2017-03-03: qty 2

## 2017-03-03 MED ORDER — CEFAZOLIN SODIUM-DEXTROSE 2-4 GM/100ML-% IV SOLN: 2.0000 g | INTRAVENOUS | Status: DC

## 2017-03-03 SURGICAL SUPPLY — 62 items
APPLIER CLIP 9.375 MED OPEN (MISCELLANEOUS) ×2
BENZOIN TINCTURE PRP APPL 2/3 (GAUZE/BANDAGES/DRESSINGS) IMPLANT
BINDER BREAST LRG (GAUZE/BANDAGES/DRESSINGS) IMPLANT
BINDER BREAST MEDIUM (GAUZE/BANDAGES/DRESSINGS) IMPLANT
BINDER BREAST XLRG (GAUZE/BANDAGES/DRESSINGS) ×2 IMPLANT
BINDER BREAST XXLRG (GAUZE/BANDAGES/DRESSINGS) IMPLANT
BLADE HEX COATED 2.75 (ELECTRODE) ×2 IMPLANT
BLADE SURG 10 STRL SS (BLADE) IMPLANT
BLADE SURG 15 STRL LF DISP TIS (BLADE) ×1 IMPLANT
BLADE SURG 15 STRL SS (BLADE) ×1
CANISTER SUC SOCK COL 7IN (MISCELLANEOUS) IMPLANT
CANISTER SUCT 1200ML W/VALVE (MISCELLANEOUS) ×2 IMPLANT
CHLORAPREP W/TINT 26ML (MISCELLANEOUS) ×2 IMPLANT
CLIP APPLIE 9.375 MED OPEN (MISCELLANEOUS) ×1 IMPLANT
COVER BACK TABLE 60X90IN (DRAPES) ×2 IMPLANT
COVER MAYO STAND STRL (DRAPES) ×2 IMPLANT
COVER PROBE W GEL 5X96 (DRAPES) ×2 IMPLANT
DECANTER SPIKE VIAL GLASS SM (MISCELLANEOUS) IMPLANT
DERMABOND ADVANCED (GAUZE/BANDAGES/DRESSINGS) ×2
DERMABOND ADVANCED .7 DNX12 (GAUZE/BANDAGES/DRESSINGS) ×2 IMPLANT
DEVICE DUBIN W/COMP PLATE 8390 (MISCELLANEOUS) ×4 IMPLANT
DRAPE LAPAROSCOPIC ABDOMINAL (DRAPES) ×2 IMPLANT
DRAPE UTILITY XL STRL (DRAPES) ×2 IMPLANT
DRSG PAD ABDOMINAL 8X10 ST (GAUZE/BANDAGES/DRESSINGS) ×4 IMPLANT
ELECT REM PT RETURN 9FT ADLT (ELECTROSURGICAL) ×2
ELECTRODE REM PT RTRN 9FT ADLT (ELECTROSURGICAL) ×1 IMPLANT
GAUZE SPONGE 4X4 12PLY STRL (GAUZE/BANDAGES/DRESSINGS) ×2 IMPLANT
GLOVE BIO SURGEON STRL SZ7 (GLOVE) ×2 IMPLANT
GLOVE BIOGEL PI IND STRL 7.0 (GLOVE) ×1 IMPLANT
GLOVE BIOGEL PI INDICATOR 7.0 (GLOVE) ×1
GLOVE EUDERMIC 7 POWDERFREE (GLOVE) ×4 IMPLANT
GOWN STRL REUS W/ TWL LRG LVL3 (GOWN DISPOSABLE) ×2 IMPLANT
GOWN STRL REUS W/ TWL XL LVL3 (GOWN DISPOSABLE) ×1 IMPLANT
GOWN STRL REUS W/TWL LRG LVL3 (GOWN DISPOSABLE) ×2
GOWN STRL REUS W/TWL XL LVL3 (GOWN DISPOSABLE) ×1
ILLUMINATOR WAVEGUIDE N/F (MISCELLANEOUS) IMPLANT
KIT MARKER MARGIN INK (KITS) ×4 IMPLANT
LIGHT WAVEGUIDE WIDE FLAT (MISCELLANEOUS) IMPLANT
NDL SAFETY ECLIPSE 18X1.5 (NEEDLE) ×1 IMPLANT
NEEDLE HYPO 18GX1.5 SHARP (NEEDLE) ×1
NEEDLE HYPO 25X1 1.5 SAFETY (NEEDLE) ×4 IMPLANT
NS IRRIG 1000ML POUR BTL (IV SOLUTION) ×2 IMPLANT
PACK BASIN DAY SURGERY FS (CUSTOM PROCEDURE TRAY) ×2 IMPLANT
PENCIL BUTTON HOLSTER BLD 10FT (ELECTRODE) ×2 IMPLANT
SHEET MEDIUM DRAPE 40X70 STRL (DRAPES) IMPLANT
SLEEVE SCD COMPRESS KNEE MED (MISCELLANEOUS) ×2 IMPLANT
SPONGE LAP 18X18 X RAY DECT (DISPOSABLE) ×2 IMPLANT
SPONGE LAP 4X18 X RAY DECT (DISPOSABLE) ×4 IMPLANT
STRIP CLOSURE SKIN 1/2X4 (GAUZE/BANDAGES/DRESSINGS) IMPLANT
SUT MNCRL AB 4-0 PS2 18 (SUTURE) ×4 IMPLANT
SUT MON AB 4-0 PC3 18 (SUTURE) ×2 IMPLANT
SUT SILK 2 0 SH (SUTURE) ×4 IMPLANT
SUT VIC AB 2-0 CT1 27 (SUTURE)
SUT VIC AB 2-0 CT1 TAPERPNT 27 (SUTURE) IMPLANT
SUT VIC AB 3-0 SH 27 (SUTURE) ×1
SUT VIC AB 3-0 SH 27X BRD (SUTURE) ×1 IMPLANT
SUT VICRYL 3-0 CR8 SH (SUTURE) ×4 IMPLANT
SYR 10ML LL (SYRINGE) ×4 IMPLANT
TOWEL OR 17X24 6PK STRL BLUE (TOWEL DISPOSABLE) ×2 IMPLANT
TOWEL OR NON WOVEN STRL DISP B (DISPOSABLE) ×2 IMPLANT
TUBE CONNECTING 20X1/4 (TUBING) ×2 IMPLANT
YANKAUER SUCT BULB TIP NO VENT (SUCTIONS) ×2 IMPLANT

## 2017-03-03 NOTE — Discharge Instructions (Signed)
[facility] °Office Phone Number [phone] ° °BREAST BIOPSY/ PARTIAL MASTECTOMY: POST OP INSTRUCTIONS ° °Always review your discharge instruction sheet given to you by the facility where your surgery was performed. ° °IF YOU HAVE DISABILITY OR FAMILY LEAVE FORMS, YOU MUST BRING THEM TO THE OFFICE FOR PROCESSING.  DO NOT GIVE THEM TO YOUR DOCTOR. ° °1. A prescription for pain medication may be given to you upon discharge.  Take your pain medication as prescribed, if needed.  If narcotic pain medicine is not needed, then you may take acetaminophen (Tylenol) or ibuprofen (Advil) as needed. °2. Take your usually prescribed medications unless otherwise directed °3. If you need a refill on your pain medication, please contact your pharmacy.  They will contact our office to request authorization.  Prescriptions will not be filled after 5pm or on week-ends. °4. You should eat very light the first 24 hours after surgery, such as soup, crackers, pudding, etc.  Resume your normal diet the day after surgery. °5. Most patients will experience some swelling and bruising in the breast.  Ice packs and a good support bra will help.  Swelling and bruising can take several days to resolve.  °6. It is common to experience some constipation if taking pain medication after surgery.  Increasing fluid intake and taking a stool softener will usually help or prevent this problem from occurring.  A mild laxative (Milk of Magnesia or Miralax) should be taken according to package directions if there are no bowel movements after 48 hours. °7. Unless discharge instructions indicate otherwise, you may remove your bandages 24-48 hours after surgery, and you may shower at that time.  You may have steri-strips (small skin tapes) in place directly over the incision.  These strips should be left on the skin for 7-10 days.  If your surgeon used skin glue on the incision, you may shower in 24 hours.  The glue will flake off over the  next 2-3 weeks.  Any sutures or staples will be removed at the office during your follow-up visit. °8. ACTIVITIES:  You may resume regular daily activities (gradually increasing) beginning the next day.  Wearing a good support bra or sports bra minimizes pain and swelling.  You may have sexual intercourse when it is comfortable. °a. You may drive when you no longer are taking prescription pain medication, you can comfortably wear a seatbelt, and you can safely maneuver your car and apply brakes. °b. RETURN TO WORK:  ______________________________________________________________________________________ °9. You should see your doctor in the office for a follow-up appointment approximately two weeks after your surgery.  Your doctor’s nurse will typically make your follow-up appointment when she calls you with your pathology report.  Expect your pathology report 2-3 business days after your surgery.  You may call to check if you do not hear from us after three days. °10. OTHER INSTRUCTIONS: _______________________________________________________________________________________________ _____________________________________________________________________________________________________________________________________ °_____________________________________________________________________________________________________________________________________ °_____________________________________________________________________________________________________________________________________ ° °WHEN TO CALL YOUR DOCTOR: °1. Fever over 101.0 °2. Nausea and/or vomiting. °3. Extreme swelling or bruising. °4. Continued bleeding from incision. °5. Increased pain, redness, or drainage from the incision. ° °The clinic staff is available to answer your questions during regular business hours.  Please don’t hesitate to call and ask to speak to one of the nurses for clinical concerns.  If you have a medical emergency, go to the nearest  emergency room or call 911.  A surgeon from [facility] is always on call at the hospital. ° °For further questions, please visit [email or web address]  ° ° ° ° °  Post Anesthesia Home Care Instructions ° °Activity: °Get plenty of rest for the remainder of the day. A responsible individual must stay with you for 24 hours following the procedure.  °For the next 24 hours, DO NOT: °-Drive a car °-Operate machinery °-Drink alcoholic beverages °-Take any medication unless instructed by your physician °-Make any legal decisions or sign important papers. ° °Meals: °Start with liquid foods such as gelatin or soup. Progress to regular foods as tolerated. Avoid greasy, spicy, heavy foods. If nausea and/or vomiting occur, drink only clear liquids until the nausea and/or vomiting subsides. Call your physician if vomiting continues. ° °Special Instructions/Symptoms: °Your throat may feel dry or sore from the anesthesia or the breathing tube placed in your throat during surgery. If this causes discomfort, gargle with warm salt water. The discomfort should disappear within 24 hours. ° °If you had a scopolamine patch placed behind your ear for the management of post- operative nausea and/or vomiting: ° °1. The medication in the patch is effective for 72 hours, after which it should be removed.  Wrap patch in a tissue and discard in the trash. Wash hands thoroughly with soap and water. °2. You may remove the patch earlier than 72 hours if you experience unpleasant side effects which may include dry mouth, dizziness or visual disturbances. °3. Avoid touching the patch. Wash your hands with soap and water after contact with the patch. °  ° °

## 2017-03-03 NOTE — Anesthesia Preprocedure Evaluation (Signed)
Anesthesia Evaluation  Patient identified by MRN, date of birth, ID band Patient awake    Reviewed: Allergy & Precautions, H&P , NPO status , Patient's Chart, lab work & pertinent test results  History of Anesthesia Complications (+) PONV and history of anesthetic complications  Airway Mallampati: II  TM Distance: >3 FB Neck ROM: Full    Dental no notable dental hx.    Pulmonary neg pulmonary ROS, asthma ,    Pulmonary exam normal breath sounds clear to auscultation       Cardiovascular hypertension, Pt. on medications negative cardio ROS Normal cardiovascular exam Rhythm:Regular Rate:Normal     Neuro/Psych negative neurological ROS  negative psych ROS   GI/Hepatic negative GI ROS, Neg liver ROS,   Endo/Other  negative endocrine ROSHypothyroidism   Renal/GU negative Renal ROS  negative genitourinary   Musculoskeletal negative musculoskeletal ROS (+)   Abdominal   Peds negative pediatric ROS (+)  Hematology negative hematology ROS (+)   Anesthesia Other Findings   Reproductive/Obstetrics negative OB ROS                             Anesthesia Physical  Anesthesia Plan  ASA: II  Anesthesia Plan: General and Regional   Post-op Pain Management: GA combined w/ Regional for post-op pain   Induction: Intravenous  Airway Management Planned: LMA  Additional Equipment:   Intra-op Plan:   Post-operative Plan: Extubation in OR  Informed Consent: I have reviewed the patients History and Physical, chart, labs and discussed the procedure including the risks, benefits and alternatives for the proposed anesthesia with the patient or authorized representative who has indicated his/her understanding and acceptance.   Dental advisory given  Plan Discussed with: CRNA  Anesthesia Plan Comments:         Anesthesia Quick Evaluation

## 2017-03-03 NOTE — Interval H&P Note (Signed)
History and Physical Interval Note:  03/03/2017 10:54 AM  Erin Novak  has presented today for surgery, with the diagnosis of L BREAST CANCER AND ABNORMAL MAMMOGRAM  The various methods of treatment have been discussed with the patient and family. After consideration of risks, benefits and other options for treatment, the patient has consented to  Procedure(s): LEFT BREAST LUMPECTOMY WITH RADIOACTIVE SEED X2 AND SENTINEL LYMPH NODE BIOPSY (Left) as a surgical intervention .  The patient's history has been reviewed, patient examined, no change in status, stable for surgery.  I have reviewed the patient's chart and labs.  Questions were answered to the patient's satisfaction.     [name]

## 2017-03-03 NOTE — Anesthesia Postprocedure Evaluation (Signed)
Anesthesia Post Note  Patient: Erin Novak  Procedure(s) Performed: Procedure(s) (LRB): LEFT BREAST LUMPECTOMY WITH RADIOACTIVE SEED X2 AND SENTINEL LYMPH NODE BIOPSY (Left)  Patient location during evaluation: PACU Anesthesia Type: Regional and General Level of consciousness: sedated and patient cooperative Pain management: pain level controlled Vital Signs Assessment: post-procedure vital signs reviewed and stable Respiratory status: spontaneous breathing Cardiovascular status: stable Anesthetic complications: yes Anesthetic complication details: PONVComments: Pt with severe chronic nausea. Pt had fluctuating nausea in PACU, but ultimately could not get to the point of being comfortable enough to be d/c'd home. She will stay overnight for observation, IV hydration, and antiemetic therapy.       Last Vitals:  Vitals:   03/03/17 1730 03/03/17 2100  BP: (!) 142/68 133/66  Pulse: 65 68  Resp: 16 16  Temp: 36.7 C 36.6 C    Last Pain:  Vitals:   03/03/17 2100  TempSrc:   PainSc: 0-No pain                 Nolon Nations

## 2017-03-03 NOTE — Progress Notes (Signed)
Assisted Dr. Germeroth with left, ultrasound guided, pectoralis block. Side rails up, monitors on throughout procedure. See vital signs in flow sheet. Tolerated Procedure well. 

## 2017-03-03 NOTE — Op Note (Signed)
Patient Name:           Erin Novak   Date of Surgery:        03/03/2017  Pre op Diagnosis:      Invasive lobular carcinoma left breast, 3:00 position, estrogen receptor positive                                      Abnormal mammogram left breast, 9:00 position, discordant biopsy  Post op Diagnosis:    Same  Procedure:                 Inject blue dye left breast                                      Left breast lumpectomy with radioactive seed localization, 3:00 position                                      Left breast lumpectomy with radioactive seed localization, 9:00 position                                      Left axillary deep  sentinel lymph node biopsy  Surgeon:                     Edsel Petrin. Dalbert Batman, M.D., FACS  Assistant:                      OR staff   Indication for Assistant: n/a  Operative Indications:    This is a 76 year old female who is brought to the operating room for surgical management of the cancer in her left breast at the 3 o'clock position and a second area of distortion in the left breast at the 9 o'clock position. She was initially evaluated in the [facility] following image guided biopsies of her left breast, 1 cm mass, 3 o'clock position by the BCG. Dr. [name] is her PCP. Dr. Radene Journey is her gynecologist. Drs. Magrinat and Lisbeth Renshaw are involved in her care.       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 Axillary ultrasound was negative.      MRI was performed because she has a lobular histology. The MRI showed a spiculated enhancing mass at both the 3 o'clock position where the known cancer is and also a small spiculated enhancing mass of the left breast at the 9 o'clock position. Image guided biopsy of the 9 o'clock position was performed earlier this week and shows fibrocystic changes, usual ductal hyperplasia,  metaplasia, calcifications but no atypia. Dr. Franki Cabot states that this is discordant and that this area needs to be excised. I agree. We would like to avoid mastectomy. I think that double lumpectomy with double radioactive seed is reasonable.Marland Kitchen       She will be scheduled for left breast lumpectomy 2 with left radioactive seed localization 2. Left axillary deep sentinel lymph node biopsy and injection of blue dye. I discussed  the indications, details, techniques, and numerous risk of the surgery with her and her daughter.  She agrees with this plan   Operative Findings:       We were able to hear both radioactive seeds with the neoprobe.  Left pectoral block was performed by anesthesia.  Technetium 99 injection was performed by the nuclear medicine technician.  The lumpectomy at the 3:00 position was performed with a transverse radial incision.  Specimen mammogram looked very good with the marker clip and seed in the center the specimen.  The lumpectomy of the left breast at the 9:00 position was performed through a circumareolar incision at the areolar margin.  There was a small hematoma present.  This lumpectomy specimen mammogram also looked very good with the marker clips and seed in the center of the specimen.  I found 3 sentinel lymph nodes.  Procedure in Detail:          Following the induction of general LMA anesthesia a surgical timeout was performed.  Intravenous antibiotics were given.  Following alcohol prep I injected 5 mL of blue dye into the left breast, subareolar area, and massage the breast for a few minutes.     The left chest wall, left breast, left axilla and shoulder were then prepped and draped in a sterile fashion.  0.5% Marcaine was used as local infiltration anesthetic.  I used the neoprobe to map out the 2 separate lumpectomies.  The first lumpectomy was performed with a transverse radial incision at the 3:00 position of the left breast.  Lumpectomy was performed using  the neoprobe and electrocautery.  The specimen was marked with silk sutures and a 6 color ink kit to orient the pathologist.  Specimen mammogram looked good.      The second lumpectomy was best performed through a circumareolar incision at the areolar margin medially at the 9:00 position.  Lumpectomy was performed with the neoprobe and electrocautery and marked in a similar fashion.  The specimen mammogram for this specimen also looked good.      A transverse incision was made in the left axilla at the hairline.  Dissection was carried down through the clavipectoral fascia.  I entered the axillary space.  Using the neoprobe I mapped and found 3 sentinel lymph nodes that were hot and blue.  This is all the radioactivity found.  The specimens were sent separately.     All 3 incisions were irrigated and hemostasis achieved.  Both lumpectomy cavities were marked with 5 metal marker clips according to our protocol.  All 3 incisions were closed in multiple layers with interrupted sutures of 3-0 Vicryl and all 3 skin incisions were closed with running subcuticular 4-0 Monocryl and Dermabond.  Dry bandages and a breast binder were placed.  The patient tolerated the procedure well was taken to PACU in stable condition.  EBL 30 mL.  Counts correct.  Complications none.       Edsel Petrin. Dalbert Batman, M.D., FACS General and Minimally Invasive Surgery Breast and Colorectal Surgery  Addendum: I locked onto the [facility] website and reviewed the patient's medication history   03/03/2017 1:43 PM

## 2017-03-03 NOTE — Anesthesia Procedure Notes (Signed)
Anesthesia Regional Block: Pectoralis block   Pre-Anesthetic Checklist: ,, timeout performed, Correct Patient, Correct Site, Correct Laterality, Correct Procedure, Correct Position, site marked, Risks and benefits discussed,  Surgical consent,  Pre-op evaluation,  At surgeon's request and post-op pain management  Laterality: Left  Prep: chloraprep       Needles:   Needle Type: Stimiplex     Needle Length: 9cm      Additional Needles:   Procedures: ultrasound guided,,,,,,,,  Narrative:  Start time: 03/03/2017 11:10 AM End time: 03/03/2017 11:15 AM Injection made incrementally with aspirations every 5 mL.  Performed by: Personally  Anesthesiologist: Nolon Nations  Additional Notes: Patient tolerated well. Good fascial spread noted.

## 2017-03-03 NOTE — Anesthesia Procedure Notes (Signed)
Procedure Name: Intubation Date/Time: 03/03/2017 12:02 PM Performed by: Lyndee Leo Pre-anesthesia Checklist: Patient identified, Emergency Drugs available, Suction available and Patient being monitored Patient Re-evaluated:Patient Re-evaluated prior to inductionOxygen Delivery Method: Circle system utilized Preoxygenation: Pre-oxygenation with 100% oxygen Intubation Type: IV induction Ventilation: Mask ventilation without difficulty Laryngoscope Size: Miller and 2 Grade View: Grade II Tube type: Oral Tube size: 7.0 mm Number of attempts: 1 Airway Equipment and Method: Stylet and Oral airway Placement Confirmation: ETT inserted through vocal cords under direct vision,  positive ETCO2 and breath sounds checked- equal and bilateral Tube secured with: Tape Dental Injury: Teeth and Oropharynx as per pre-operative assessment

## 2017-03-03 NOTE — Transfer of Care (Signed)
Immediate Anesthesia Transfer of Care Note  Patient: Erin Novak  Procedure(s) Performed: Procedure(s): LEFT BREAST LUMPECTOMY WITH RADIOACTIVE SEED X2 AND SENTINEL LYMPH NODE BIOPSY (Left)  Patient Location: PACU  Anesthesia Type:GA combined with regional for post-op pain  Level of Consciousness: awake, sedated and patient cooperative  Airway & Oxygen Therapy: Patient Spontanous Breathing and Patient connected to face mask oxygen  Post-op Assessment: Report given to RN and Post -op Vital signs reviewed and stable  Post vital signs: Reviewed and stable  Last Vitals:  Vitals:   03/03/17 1023 03/03/17 1350  BP: (!) 156/64 (!) (P) 121/52  Pulse: 72 73  Resp: 18 (P) 16  Temp: 36.7 C     Last Pain:  Vitals:   03/03/17 1023  TempSrc: Oral         Complications: No apparent anesthesia complications

## 2017-03-04 DIAGNOSIS — N6022 Fibroadenosis of left breast: Secondary | ICD-10-CM | POA: Diagnosis not present

## 2017-03-04 DIAGNOSIS — Z170 Estrogen receptor positive status [ER+]: Secondary | ICD-10-CM | POA: Diagnosis not present

## 2017-03-04 DIAGNOSIS — I10 Essential (primary) hypertension: Secondary | ICD-10-CM | POA: Diagnosis not present

## 2017-03-04 DIAGNOSIS — C50412 Malignant neoplasm of upper-outer quadrant of left female breast: Secondary | ICD-10-CM | POA: Diagnosis not present

## 2017-03-04 DIAGNOSIS — J45909 Unspecified asthma, uncomplicated: Secondary | ICD-10-CM | POA: Diagnosis not present

## 2017-03-04 DIAGNOSIS — N6092 Unspecified benign mammary dysplasia of left breast: Secondary | ICD-10-CM | POA: Diagnosis not present

## 2017-03-04 MED ORDER — SODIUM CHLORIDE FLUSH 0.9 % IV SOLN
INTRAVENOUS | Status: AC
  Filled 2017-03-04: qty 10

## 2017-03-04 NOTE — Progress Notes (Signed)
Pt continues to vomit and requires 02 therapy  per nasal cannula to maintain 02Sat >92% Dr. Lissa Hoard and Dr Kae Heller (on call for Dr. Dalbert Batman) made aware and decision to keep patient overnight in [place] was made. Patient will be given IV fluids, supplemental 02 and more antiemetics. Pt will be continuously monitored by oximetry. Patients daughters aware and accepting.

## 2017-03-05 ENCOUNTER — Encounter (HOSPITAL_BASED_OUTPATIENT_CLINIC_OR_DEPARTMENT_OTHER): Payer: Self-pay | Admitting: General Surgery

## 2017-03-05 DIAGNOSIS — J3081 Allergic rhinitis due to animal (cat) (dog) hair and dander: Secondary | ICD-10-CM | POA: Diagnosis not present

## 2017-03-05 DIAGNOSIS — J3089 Other allergic rhinitis: Secondary | ICD-10-CM | POA: Diagnosis not present

## 2017-03-09 DIAGNOSIS — J3089 Other allergic rhinitis: Secondary | ICD-10-CM | POA: Diagnosis not present

## 2017-03-16 ENCOUNTER — Ambulatory Visit: Payer: Medicare Other | Admitting: Radiation Oncology

## 2017-03-16 ENCOUNTER — Ambulatory Visit: Payer: Medicare Other

## 2017-03-18 ENCOUNTER — Telehealth: Payer: Self-pay | Admitting: *Deleted

## 2017-03-18 NOTE — Telephone Encounter (Signed)
Ordered oncotype per Dr. Jana Hakim. Spoke with patient and mad her aware and let her know we will cancel appointment with Dr. Lisbeth Renshaw until we get the results back of the oncotype.  Patient verbalized understanding.

## 2017-03-19 DIAGNOSIS — J3081 Allergic rhinitis due to animal (cat) (dog) hair and dander: Secondary | ICD-10-CM | POA: Diagnosis not present

## 2017-03-19 DIAGNOSIS — J3089 Other allergic rhinitis: Secondary | ICD-10-CM | POA: Diagnosis not present

## 2017-03-24 ENCOUNTER — Ambulatory Visit: Payer: Medicare Other

## 2017-03-24 ENCOUNTER — Ambulatory Visit: Payer: Medicare Other | Admitting: Radiation Oncology

## 2017-03-25 DIAGNOSIS — C50412 Malignant neoplasm of upper-outer quadrant of left female breast: Secondary | ICD-10-CM | POA: Diagnosis not present

## 2017-03-25 DIAGNOSIS — Z170 Estrogen receptor positive status [ER+]: Secondary | ICD-10-CM | POA: Diagnosis not present

## 2017-03-26 ENCOUNTER — Other Ambulatory Visit: Payer: Self-pay | Admitting: Oncology

## 2017-03-26 ENCOUNTER — Telehealth: Payer: Self-pay | Admitting: *Deleted

## 2017-03-26 ENCOUNTER — Encounter: Payer: Self-pay | Admitting: Radiation Oncology

## 2017-03-26 DIAGNOSIS — J3081 Allergic rhinitis due to animal (cat) (dog) hair and dander: Secondary | ICD-10-CM | POA: Diagnosis not present

## 2017-03-26 DIAGNOSIS — J3089 Other allergic rhinitis: Secondary | ICD-10-CM | POA: Diagnosis not present

## 2017-03-26 NOTE — Telephone Encounter (Signed)
Received Oncotype Dx results of 20.  Placed a copy in Dr. Virgie Dad basket.  Placed a copy on Keisha M.'s desk and took a copy to HIM to scan.

## 2017-03-26 NOTE — Telephone Encounter (Signed)
Left vm for return call to discuss oncotype score and next step. Contact information provided.

## 2017-03-26 NOTE — Progress Notes (Unsigned)
[facility]  [phone] [phone]     ID: Erin Novak DOB: 1941-04-16  MR#: [id number]  YIR#:[id number]  Patient Care Team: [name], MD as PCP - General (Internal Medicine) Fanny Skates, MD as Consulting Physician (General Surgery) Jennessy Sandridge, Virgie Dad, MD as Consulting Physician (Oncology) Kyung Rudd, MD as Consulting Physician (Radiation Oncology) Erin Farrier, MD as Consulting Physician (Obstetrics and Gynecology) Erin Novak, Erin Grinder, MD as Consulting Physician (Allergy and Immunology) Erin Cruel, MD OTHER MD:  CHIEF COMPLAINT: Estrogen receptor positive lobular breast cancer  CURRENT TREATMENT: Awaiting definitive surgery   BREAST CANCER HISTORY: The patient had routine screening mammography with tomography at the [facility] 01/12/2017 showing an area of concern in the left breast. On 01/20/2017 she underwent left diagnostic mammography with tomography and left breast ultrasonography. The breast density was category B. In the upper outer quadrant of the left breast there was a partially obscured rounded mass which was not palpable by exam. Ultrasonography it was located at the 11:00 radiant 3 cm from the nipple and measured 1.2 cm.  Biopsy of this mass 01/20/2017 showed (S AAA [id number]) invasive lobular carcinoma, E-cadherin negative, grade 2, estrogen receptor 90% positive, progesterone receptor 80% positive, both with strong staining intensity, with an MIB-1 of 15%, and no HER-2 amplification, the signals ratio being 1.20 and the number per cell 1.80.  Her subsequent history is as detailed below  INTERVAL HISTORY: Erin Novak was evaluated in the multidisciplinary breast cancer clinic 01/28/2017 a combined by her daughter Erin Novak area Her case was also presented in the multidisciplinary breast cancer conference that same morning. At that time a preliminary plan was proposed: MRI because it is a lobular tumor, but likely breast  conserving surgery with sentinel lymph node sampling, no chemotherapy/no Oncotype, plus or minus adjuvant radiation, finally anti-estrogen therapy  REVIEW OF SYSTEMS: There were no specific symptoms leading to the original mammogram, which was routinely scheduled. The patient denies unusual headaches, visual changes, nausea, vomiting, stiff neck, dizziness, or gait imbalance. There has been no cough, phlegm production, or pleurisy, no chest pain or pressure, and no change in bowel or bladder habits. The patient denies fever, rash, bleeding, unexplained fatigue or unexplained weight loss. She admits to some allergy symptoms including a frequent dry cough. She does not exercise regularly. A detailed review of systems was otherwise entirely negative.   PAST MEDICAL HISTORY: Past Medical History:  Diagnosis Date  . Abdominal pain   . Allergy   . Asthma   . Bronchitis   . Cholelithiasis 06-20-2011   [place]   . Diarrhea   . GERD (gastroesophageal reflux disease)   . Hyperlipidemia   . Hypertension   . Hypothyroidism   . Migraines   . PONV (postoperative nausea and vomiting)   . Thyroid disease    hypothyroid  . Ulcer     PAST SURGICAL HISTORY: Past Surgical History:  Procedure Laterality Date  . ABDOMINAL HYSTERECTOMY    . BREAST LUMPECTOMY WITH RADIOACTIVE SEED AND SENTINEL LYMPH NODE BIOPSY Left 03/03/2017   Procedure: LEFT BREAST LUMPECTOMY WITH RADIOACTIVE SEED X2 AND SENTINEL LYMPH NODE BIOPSY;  Surgeon: Fanny Skates, MD;  Location: [facility];  Service: General;  Laterality: Left;  . CHOLECYSTECTOMY  12/25/2011   Procedure: LAPAROSCOPIC CHOLECYSTECTOMY WITH INTRAOPERATIVE CHOLANGIOGRAM;  Surgeon: Earnstine Regal, MD;  Location: [facility];  Service: General;  Laterality: N/A;  laparoscopic cholecystectomy with intraoperative cholangiogram  . COLONOSCOPY  01/10/2003   internal hemorrhoids (Dr.  Lajoyce Corners)  . FOOT SURGERY     right  . hysterectomy    . ruptured disk     in neck  .  TONSILLECTOMY    . UPPER GASTROINTESTINAL ENDOSCOPY  01/10/2003   hiatal hernia (Dr. Lajoyce Corners)    FAMILY HISTORY Family History  Problem Relation Age of Onset  . Asthma Father   . Colon cancer Neg Hx   . Stomach cancer Neg Hx   . Esophageal cancer Neg Hx   . Rectal cancer Neg Hx   The patient's father died in his 33s from "old age". The mother died in her 29s with Alzheimer's disease. The patient had one brother, 2 sisters. There is no history of breast or ovarian cancer in the family to the patient's knowledge.  GYNECOLOGIC HISTORY:  No LMP recorded. Patient has had a hysterectomy.  Menarche age 52, first live birth age 85, the patient is [place] P2. She underwent hysterectomy in 1993. (She does not know whether the ovaries were removed are not) She did not take hormone replacement.   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 She is currently widowed and lives alone with 2 indoor N1 outdoor cats. Daughter Erin Novak lives near the patient in [place] and works at the [employer]. Daughter Erin Novak also lives in [place] and works as a Special educational needs teacher. The patient has 2 grandchildren. She is a Tourist information centre manager.     ADVANCED DIRECTIVES: Not in place   HEALTH MAINTENANCE: Social History  Substance Use Topics  . Smoking status: Never Smoker  . Smokeless tobacco: Never Used  . Alcohol use Yes     Comment: rare     Colonoscopy: October 2014/[facility]  PAP:  Bone density: January 2016   Allergies  Allergen Reactions  . Amoxicillin-Pot Clavulanate     REACTION: rash  . Amoxicillin-Pot Clavulanate     Rash  . Lisinopril Other (See Comments)    cough  . Percodan [Oxycodone-Aspirin] Nausea And Vomiting and Other (See Comments)    Sweating, unable to sleep  . Prednisone Other (See Comments)    Makes pt sweat, unable to sleep    Current Outpatient Prescriptions  Medication Sig Dispense Refill  . ALBUTEROL SULFATE HFA IN Inhale 2  puffs into the lungs every 4 (four) hours as needed.    . budesonide-formoterol (SYMBICORT) 160-4.5 MCG/ACT inhaler Inhale 2 puffs into the lungs 2 (two) times daily as needed. For shortness of breath    . Cholecalciferol (VITAMIN D-3) 5000 UNITS TABS Take 5,000 Units by mouth daily.     Marland Kitchen diltiazem (TIAZAC) 240 MG 24 hr capsule Take 240 mg by mouth every morning.     Marland Kitchen ibuprofen (ADVIL,MOTRIN) 200 MG tablet Take 400 mg by mouth every 6 (six) hours as needed. For pain     . levothyroxine (SYNTHROID, LEVOTHROID) 88 MCG tablet Take 88 mcg by mouth every morning.     . Multiple Vitamins-Minerals (HAIR/SKIN/NAILS PO) Take 1 tablet by mouth daily.    . NON FORMULARY Inject 1 Syringe into the muscle once a week. Allergy injections weekly    . Omega-3 Fatty Acids (FISH OIL) 1200 MG CAPS Take 1,200 mg by mouth daily.     . traMADol (ULTRAM) 50 MG tablet Take 1-2 tablets (50-100 mg total) by mouth every 6 (six) hours as needed for moderate pain. 30 tablet 0   No current facility-administered medications for this visit.     OBJECTIVE: Older white woman  in no acute distress There were no vitals filed for this visit.   There is no height or weight on file to calculate BMI.    ECOG FS:0 - Asymptomatic  Ocular: Sclerae unicteric, pupils equal, round and reactive to light Ear-nose-throat: Oropharynx clear and moist Lymphatic: No cervical or supraclavicular adenopathy Lungs no rales or rhonchi, good excursion bilaterally Heart regular rate and rhythm, no murmur appreciated Abd soft, nontender, positive bowel sounds MSK no focal spinal tenderness, no joint edema Neuro: non-focal, well-oriented, appropriate affect Breasts: The right breast is unremarkable. The left breast is status post recent biopsy, with a moderate ecchymosis but no palpable mass. Both axillae are benign.   LAB RESULTS:  CMP     Component Value Date/Time   NA 140 02/25/2017 1204   NA 143 01/28/2017 1216   K 4.3 02/25/2017 1204    K 4.3 01/28/2017 1216   CL 104 02/25/2017 1204   CO2 27 02/25/2017 1204   CO2 28 01/28/2017 1216   GLUCOSE 101 (H) 02/25/2017 1204   GLUCOSE 98 01/28/2017 1216   BUN 10 02/25/2017 1204   BUN 9.3 01/28/2017 1216   CREATININE 0.70 02/25/2017 1204   CREATININE 0.8 01/28/2017 1216   CALCIUM 9.3 02/25/2017 1204   CALCIUM 9.9 01/28/2017 1216   PROT 7.8 01/28/2017 1216   ALBUMIN 4.1 01/28/2017 1216   AST 17 01/28/2017 1216   ALT 14 01/28/2017 1216   ALKPHOS 80 01/28/2017 1216   BILITOT 0.51 01/28/2017 1216   GFRNONAA >60 02/25/2017 1204   GFRAA >60 02/25/2017 1204    INo results found for: SPEP, UPEP  Lab Results  Component Value Date   WBC 7.4 01/28/2017   NEUTROABS 3.9 01/28/2017   HGB 15.0 01/28/2017   HCT 45.1 01/28/2017   MCV 90.6 01/28/2017   PLT 202 01/28/2017      Chemistry      Component Value Date/Time   NA 140 02/25/2017 1204   NA 143 01/28/2017 1216   K 4.3 02/25/2017 1204   K 4.3 01/28/2017 1216   CL 104 02/25/2017 1204   CO2 27 02/25/2017 1204   CO2 28 01/28/2017 1216   BUN 10 02/25/2017 1204   BUN 9.3 01/28/2017 1216   CREATININE 0.70 02/25/2017 1204   CREATININE 0.8 01/28/2017 1216      Component Value Date/Time   CALCIUM 9.3 02/25/2017 1204   CALCIUM 9.9 01/28/2017 1216   ALKPHOS 80 01/28/2017 1216   AST 17 01/28/2017 1216   ALT 14 01/28/2017 1216   BILITOT 0.51 01/28/2017 1216       No results found for: LABCA2  No components found for: LABCA125  No results for input(s): INR in the last 168 hours.  Urinalysis    Component Value Date/Time   COLORURINE YELLOW 12/23/2011 1436   APPEARANCEUR CLOUDY (A) 12/23/2011 1436   LABSPEC 1.017 12/23/2011 1436   PHURINE 7.5 12/23/2011 1436   GLUCOSEU NEGATIVE 12/23/2011 1436   HGBUR NEGATIVE 12/23/2011 1436   BILIRUBINUR NEGATIVE 12/23/2011 1436   KETONESUR NEGATIVE 12/23/2011 1436   PROTEINUR NEGATIVE 12/23/2011 1436   UROBILINOGEN 0.2 12/23/2011 1436   NITRITE NEGATIVE 12/23/2011 1436    LEUKOCYTESUR NEGATIVE 12/23/2011 1436     STUDIES: Mm Breast Surgical Specimen  Result Date: 03/03/2017 CLINICAL DATA:  Evaluate surgical specimen following breast excision for discordant biopsy within the inner left breast. EXAM: SPECIMEN RADIOGRAPH OF THE LEFT BREAST COMPARISON:  Previous exam(s). FINDINGS: Status post excision of the left breast. The radioactive seed  and dumbbell-shaped biopsy marker clip are present, completely intact, and were marked for pathology. IMPRESSION: Specimen radiograph of the left breast. Electronically Signed   By: Margarette Canada M.D.   On: 03/03/2017 12:58   Mm Breast Surgical Specimen  Result Date: 03/03/2017 CLINICAL DATA:  Surgical excision of a biopsy-proven invasive mammary carcinoma in the 3 o'clock position of the left breast. EXAM: SPECIMEN RADIOGRAPH OF THE LEFT BREAST COMPARISON:  Previous exam(s). FINDINGS: Status post excision of the left breast. The radioactive seed, small spiculated mass and biopsy marker clip are present, completely intact, and were marked for pathology. IMPRESSION: Specimen radiograph of the left breast. Electronically Signed   By: Claudie Revering M.D.   On: 03/03/2017 12:41   Mm Lt Radioactive Seed Loc Mammo Guide  Result Date: 02/26/2017 CLINICAL DATA:  Left breast cancer for radioacitve seed placement prior to surgery. EXAM: MAMMOGRAPHIC GUIDED RADIOACTIVE SEED LOCALIZATION OF THE LEFT BREAST COMPARISON:  Previous exam(s). FINDINGS: Patient presents for radioactive seed localization prior to left breast surgery. I met with the patient and we discussed the procedure of seed localization including benefits and alternatives. We discussed the high likelihood of a successful procedure. We discussed the risks of the procedure including infection, bleeding, Novak injury and further surgery. We discussed the low dose of radioactivity involved in the procedure. Informed, written consent was given. The usual time-out protocol was performed  immediately prior to the procedure. Using mammographic guidance, sterile technique, 1% lidocaine and an I-125 radioactive seed, core biopsy clip was localized using a lateral approach. The follow-up mammogram images confirm the seed in the expected location and were marked for Dr. Dalbert Batman. Follow-up survey of the patient confirms presence of the radioactive seed. Order number of I-125 seed:  [id number]. Total activity:  6.440 millicurie  Reference Date: January 15, 2017 The patient tolerated the procedure well and was released from the [facility]. She was given instructions regarding seed removal. IMPRESSION: Radioactive seed localization left breast. No apparent complications. Electronically Signed   By: Abelardo Diesel M.D.   On: 02/26/2017 13:12   Mm Lt Rad Seed Ea Add Lesion Loc Mammo  Result Date: 02/26/2017 CLINICAL DATA:  Left breast cancer and a second biopsy site of discordant pathologic radiologic finding left breast for surgical excision. EXAM: MAMMOGRAPHIC GUIDED RADIOACTIVE SEED LOCALIZATION OF THE LEFT BREAST COMPARISON:  Previous exam(s). FINDINGS: Patient presents for radioactive seed localization prior to left breast surgery. I met with the patient and we discussed the procedure of seed localization including benefits and alternatives. We discussed the high likelihood of a successful procedure. We discussed the risks of the procedure including infection, bleeding, Novak injury and further surgery. We discussed the low dose of radioactivity involved in the procedure. Informed, written consent was given. The usual time-out protocol was performed immediately prior to the procedure. Using mammographic guidance, sterile technique, 1% lidocaine and an I-125 radioactive seed, dumbbell-shaped clip was localized using a medial approach. The follow-up mammogram images confirm the seed in the expected location and were marked for Dr. Dalbert Batman. Follow-up survey of the patient confirms presence of the radioactive  seed. Order number of I-125 seed:  [id number]. Total activity:  3.875 millicuries  Reference Date: February 17, 2017 The patient tolerated the procedure well and was released from the [facility]. She was given instructions regarding seed removal. IMPRESSION: Radioactive seed localization left breast. No apparent complications. Electronically Signed   By: Abelardo Diesel M.D.   On: 02/26/2017 13:10    ELIGIBLE FOR  AVAILABLE RESEARCH PROTOCOL: no  ASSESSMENT: 76 y.o. [place] woman status post left breast upper outer quadrant biopsy 01/20/2017 for a clinical T1c N0, stage IA invasive lobular breast cancer, E-cadherin negative, estrogen and progesterone receptor strongly positive, with an MIB-1 of 15% and no HER-2 amplification  (1) breast conserving surgery with sentinel lymph node sampling planned  (2) likely no chemotherapy and no Oncotype or less the tumor proves larger than expected at surgery  (3) adjuvant radiation as appropriate  Anti-estrogens to follow the completion of local treatment.  PLAN: We spent the better part of today's hour-long appointment discussing the biology of breast cancer in general, and the specifics of the patient's tumor in particular. We first reviewed the fact that cancer is not one disease but more than 100 different diseases and that it is important to keep them separate-- otherwise when friends and relatives discuss their own cancer experiences with [facility] confusion can result. Similarly we explained that if breast cancer spreads to the bone or liver, the patient would not have bone cancer or liver cancer, but breast cancer in the bone and breast cancer in the liver: one cancer in three places-- not 3 different cancers which otherwise would have to be treated in 3 different ways.  We discussed the difference between local and systemic therapy. In terms of loco-regional treatment, lumpectomy plus radiation is equivalent to mastectomy as far as survival is concerned.  For this reason, and because the cosmetic results are generally superior, we recommend breast conserving surgery.   We then discussed the rationale for systemic therapy. There is some risk that this cancer may have already spread to other parts of her body. Patients frequently ask at this point about bone scans, CAT scans and PET scans to find out if they have occult breast cancer somewhere else. The problem is that in early stage disease we are much more likely to find false positives then true cancers and this would expose the patient to unnecessary procedures as well as unnecessary radiation. Scans cannot answer the question the patient really would like to know, which is whether she has microscopic disease elsewhere in her body. For those reasons we do not recommend them.  Of course we would proceed to aggressive evaluation of any symptoms that might suggest metastatic disease, but that is not the case here.  Next we went over the options for systemic therapy which are anti-estrogens, anti-HER-2 immunotherapy, and chemotherapy. Aldeen does not meet criteria for anti-HER-2 immunotherapy. She is a good candidate for anti-estrogens.  The question of chemotherapy is more complicated. Chemotherapy is most effective in rapidly growing, aggressive tumors. It is much less effective in not very aggressive, slow growing cancers, like Velta Addison 's. For that reason we do not anticipate chemotherapy would significantly lower the patient's recurrence risk. Of course if it turns out the final pathology shows a different picture we would request an Oncotype from the definitive surgical sample, as suggested by NCCN guidelines. If today's discussion though we emphasized that chemotherapy was likely not in the picture  The overall plan then is to start with surgery, discuss radiation, and then return to see me for anti-estrogen therapy, which likely will be and anastrozole  Velta Addison has a good understanding of the  overall plan. She agrees with it. She knows the goal of treatment in her case is cure. She will call with any problems that may develop before her next visit here.  Erin Cruel, MD   03/26/2017 9:46 AM Medical Oncology  and Hematology [facility] [address] [place], [place] [address] Tel. [phone]    Fax. [phone]

## 2017-03-31 ENCOUNTER — Encounter (HOSPITAL_COMMUNITY): Payer: Self-pay

## 2017-04-01 NOTE — Progress Notes (Signed)
Location of Breast Cancer:Invasive mammary carcinoma of the left breast  Histology per Pathology Report: 03-03-17 Diagnosis 1. Breast, lumpectomy, Left (3:00) - INVASIVE LOBULAR CARCINOMA, GRADE 1, SPANNING 1.9 CM. - LOBULAR NEOLASIA (LOBULAR CARCINOMA IN SITU). - RESECTION MARGINS ARE NEGATIVE FOR INVASIVE CARCINOMA. - BIOPSY SITE. - SEE ONCOLOGY TABLE. 2. Breast, lumpectomy, Left with seed (9:00) - FIBROCYSTIC CHANGE, FLORID USUAL DUCTAL HYPERPLASIA, AND ADENOSIS WITH CALCIFICATIONS. - BIOPSY SITE. - NO MALIGNANCY IDENTIFIED. 3. Lymph node, sentinel, biopsy, Left axillary #1 - ONE OF ONE LYMPH NODE NEGATIVE FOR CARCINOMA (0/1). 4. Lymph node, sentinel, biopsy, Left axillary #2 - ONE OF ONE LYMPH NODE NEGATIVE FOR CARCINOMA (0/1). 5. Lymph node, sentinel, biopsy, Left axillary #3 - ONE OF ONE LYMPH NODE NEGATIVE FOR CARCINOMA (0/1). 6. Lymph node, sentinel, biopsy, Left axillary - ONE OF ONE LYMPH NODE NEGATIVE FOR CARCINOMA (0/1). 7. Lymph nodes, regional resection, Left additional axillary - ONE OF ONE LYMPH NODE NEGATIVE FOR CARCINOMA (0/1).   Receptor Status: ER(90% + strong), PR (80% + strong), Her2-neu (neg), Ki-(15%) Diagnosis 02-09-17 Breast, left, needle core biopsy, 9:00 o'clock - PROLIFERATIVE FIBROCYSTIC CHANGES WITH FLORID AND MICROPAPILLARY USUAL DUCTAL HYPERPLASIA, APOCRINE METAPLASIA, SCLEROSING ADENOSIS, AND FOCAL SCLEROSIS. - DUCTAL ECTASIA AND INTRACYSTIC CALCIFICATIONS PRESENT   Did patient present with symptoms (if so, please note symptoms) or was this found on screening mammography?: routine screening mammography   Past/Anticipated interventions by surgeon, if any:03-03-17 Fanny Skates   Left breast lumpectomy with radioactive seed localization Left axillary deep  sentinel lymph node biopsy                Past/Anticipated interventions by medical oncology, if any:01-28-17 Dr. Jana Hakim adjuvant radiation, Anti-estrogens   Lymphedema issues, if any:  No  Pain issues, if any: No,Mentioned that she has tenderness to her left breast  SAFETY ISSUES:  Prior radiation? No  Pacemaker/ICD? No  Possible current pregnancy? No  Is the patient on methotrexate? 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 Wt Readings from Last 3 Encounters:  04/07/17 178 lb 6.4 oz (80.9 kg)  03/03/17 174 lb (78.9 kg)  01/28/17 177 lb 1.6 oz (80.3 kg)  BP (!) 159/80   Pulse 78   Temp 98.1 F (36.7 C) (Oral)   Resp 18   Ht _0  (1.6 m)   Wt 178 lb 6.4 oz (80.9 kg)   SpO2 97%   BMI 31.60 kg/m      Georgena Spurling, RN 04/01/2017,12:08 PM

## 2017-04-02 DIAGNOSIS — J3089 Other allergic rhinitis: Secondary | ICD-10-CM | POA: Diagnosis not present

## 2017-04-02 DIAGNOSIS — J3081 Allergic rhinitis due to animal (cat) (dog) hair and dander: Secondary | ICD-10-CM | POA: Diagnosis not present

## 2017-04-06 ENCOUNTER — Other Ambulatory Visit: Payer: Self-pay | Admitting: Oncology

## 2017-04-06 DIAGNOSIS — J3081 Allergic rhinitis due to animal (cat) (dog) hair and dander: Secondary | ICD-10-CM | POA: Diagnosis not present

## 2017-04-06 DIAGNOSIS — J3089 Other allergic rhinitis: Secondary | ICD-10-CM | POA: Diagnosis not present

## 2017-04-07 ENCOUNTER — Encounter: Payer: Self-pay | Admitting: Radiation Oncology

## 2017-04-07 ENCOUNTER — Ambulatory Visit
Admission: RE | Admit: 2017-04-07 | Discharge: 2017-04-07 | Disposition: A | Discharge status: Home / Self Care | Payer: Medicare Other | Source: Ambulatory Visit | Attending: Radiation Oncology | Admitting: Radiation Oncology

## 2017-04-07 VITALS — BP 159/80 | HR 78 | Temp 98.1°F | Resp 18 | Ht 63.0 in | Wt 178.4 lb

## 2017-04-07 DIAGNOSIS — K219 Gastro-esophageal reflux disease without esophagitis: Secondary | ICD-10-CM | POA: Diagnosis not present

## 2017-04-07 DIAGNOSIS — C50412 Malignant neoplasm of upper-outer quadrant of left female breast: Secondary | ICD-10-CM | POA: Insufficient documentation

## 2017-04-07 DIAGNOSIS — Z170 Estrogen receptor positive status [ER+]: Principal | ICD-10-CM

## 2017-04-07 DIAGNOSIS — Z885 Allergy status to narcotic agent status: Secondary | ICD-10-CM | POA: Insufficient documentation

## 2017-04-07 DIAGNOSIS — E785 Hyperlipidemia, unspecified: Secondary | ICD-10-CM | POA: Insufficient documentation

## 2017-04-07 DIAGNOSIS — J45909 Unspecified asthma, uncomplicated: Secondary | ICD-10-CM | POA: Insufficient documentation

## 2017-04-07 DIAGNOSIS — Z7951 Long term (current) use of inhaled steroids: Secondary | ICD-10-CM | POA: Insufficient documentation

## 2017-04-07 DIAGNOSIS — I10 Essential (primary) hypertension: Secondary | ICD-10-CM | POA: Diagnosis not present

## 2017-04-07 DIAGNOSIS — Z9889 Other specified postprocedural states: Secondary | ICD-10-CM | POA: Diagnosis not present

## 2017-04-07 DIAGNOSIS — Z90710 Acquired absence of both cervix and uterus: Secondary | ICD-10-CM | POA: Diagnosis not present

## 2017-04-07 DIAGNOSIS — Z825 Family history of asthma and other chronic lower respiratory diseases: Secondary | ICD-10-CM | POA: Insufficient documentation

## 2017-04-07 DIAGNOSIS — D0512 Intraductal carcinoma in situ of left breast: Secondary | ICD-10-CM | POA: Diagnosis not present

## 2017-04-07 DIAGNOSIS — Z510 Encounter for antineoplastic radiation therapy: Secondary | ICD-10-CM | POA: Insufficient documentation

## 2017-04-07 DIAGNOSIS — Z79899 Other long term (current) drug therapy: Secondary | ICD-10-CM | POA: Insufficient documentation

## 2017-04-07 DIAGNOSIS — E039 Hypothyroidism, unspecified: Secondary | ICD-10-CM | POA: Diagnosis not present

## 2017-04-07 DIAGNOSIS — Z888 Allergy status to other drugs, medicaments and biological substances status: Secondary | ICD-10-CM | POA: Insufficient documentation

## 2017-04-07 DIAGNOSIS — Z881 Allergy status to other antibiotic agents status: Secondary | ICD-10-CM | POA: Insufficient documentation

## 2017-04-07 NOTE — Progress Notes (Signed)
Radiation Oncology         [phone] ________________________________  Name: Erin Novak MRN: [id number]  Date: 04/07/2017  DOB: March 08, 1941  CC:Kim, Jeneen Rinks, MD  Magrinat, Virgie Dad, MD     REFERRING PHYSICIAN: Magrinat, Virgie Dad, MD   DIAGNOSIS: The encounter diagnosis was Malignant neoplasm of upper-outer quadrant of left breast in female, estrogen receptor positive ([place]).   HISTORY OF PRESENT ILLNESS: Erin Novak is a 76 y.o. female seen at the request of Dr. Dalbert Batman for a diagnosis of left breast cancer. She was found on a screening mammogram on 01/12/17 to have a mass in the left breast. Diagnostic mammogram on 01/20/17 revealed an area of distortion in the lateral aspect of the left breast with a partially obscured mass in the UIQ of the left breast. US of the left breast showed a simple cyst in the 11:00 position of the left breast 3 cm from the nipple measuring 0.9 x 0.5 x 1.2 cm and an irregular hypoechoic mass in the 3:00 position 1 cm from the nipple measuring 1.0 x 0.9 x 0.6 cm. US of the left axilla was negative. On 01/20/17, the patient had a biopsy of the 3:00 position of the left breast revealing grade 2 lobular carcinoma ER/PR positive, HER2 negative, Ki67 15%.  The patient had a bilateral breast MRI on 01/29/17 revealing a 2 x 0.6 x 1 cm spiculated mass enhancement at the 3:00 position middle depth of the left breast consistent with the biopsy site and a 1.6 x 1.5 x 1.6 cm spiculated enhancing mass in the 9:00 position of the left breast. Biopsy of the 9:00 position left breast on 02/09/17 revealed proliferative fibrocystic changes with florid and micropapillary usual ductal hyperplasia, apocrine metaplasia, sclerosing adenosis, focal sclerosis, ductal ectasia, intracyctic calcifications, and no atypia or malignancy identified.  The patient underwent left lumpectomies and left axillary sentinel lymph node biopsies on 03/03/17. Left lumpectomy in the 3:00 position revealed grade 1  invasive lobular carcinoma measuring 1.9 cm, LCIS, and the resection margins were negative. Left lumpectomy in the 9:00 position revealed fibrocystic change, florid usual ductal hyperplasia, and adenosis with calcifications. Biopsy of 5 left axillary lymph nodes were negative. The patient's pathology underwent Oncotype DX testing with a score of 20; intermediate risk with a 10-year risk of distant recurrence of 13% with Tamoxifen alone. Chemotherapy was not strongly recommended. She comes today to discuss the role of radiation for the management of her disease.  PREVIOUS RADIATION THERAPY: No  PAST MEDICAL HISTORY:  Past Medical History:  Diagnosis Date  . Abdominal pain   . Allergy   . Asthma   . Bronchitis   . Cholelithiasis 06-20-2011   [place]   . Diarrhea   . GERD (gastroesophageal reflux disease)   . Hyperlipidemia   . Hypertension   . Hypothyroidism   . Migraines   . PONV (postoperative nausea and vomiting)   . Thyroid disease    hypothyroid  . Ulcer        PAST SURGICAL HISTORY: Past Surgical History:  Procedure Laterality Date  . ABDOMINAL HYSTERECTOMY    . BREAST LUMPECTOMY WITH RADIOACTIVE SEED AND SENTINEL LYMPH NODE BIOPSY Left 03/03/2017   Procedure: LEFT BREAST LUMPECTOMY WITH RADIOACTIVE SEED X2 AND SENTINEL LYMPH NODE BIOPSY;  Surgeon: Fanny Skates, MD;  Location: [facility];  Service: General;  Laterality: Left;  . CHOLECYSTECTOMY  12/25/2011   Procedure: LAPAROSCOPIC CHOLECYSTECTOMY WITH INTRAOPERATIVE CHOLANGIOGRAM;  Surgeon: Earnstine Regal, MD;  Location: [facility];  Service: General;  Laterality: N/A;  laparoscopic cholecystectomy with intraoperative cholangiogram  . COLONOSCOPY  01/10/2003   internal hemorrhoids (Dr. Lajoyce Corners)  . FOOT SURGERY     right  . hysterectomy    . ruptured disk     in neck  . TONSILLECTOMY    . UPPER GASTROINTESTINAL ENDOSCOPY  01/10/2003   hiatal hernia (Dr. Lajoyce Corners)     FAMILY HISTORY:  Family History  Problem Relation Age of  Onset  . Asthma Father   . Colon cancer Neg Hx   . Stomach cancer Neg Hx   . Esophageal cancer Neg Hx   . Rectal cancer Neg Hx      SOCIAL HISTORY:  reports that she has never smoked. She has never used smokeless tobacco. She reports that she drinks alcohol. She reports that she does not use drugs. The patient is married and lives in [place], [place]. She is accompanied by her daughter.   ALLERGIES: Amoxicillin-pot clavulanate; Amoxicillin-pot clavulanate; Lisinopril; Percodan [oxycodone-aspirin]; and Prednisone   MEDICATIONS:  Current Outpatient Prescriptions  Medication Sig Dispense Refill  . Cholecalciferol (VITAMIN D-3) 5000 UNITS TABS Take 5,000 Units by mouth daily.     Marland Kitchen diltiazem (TIAZAC) 240 MG 24 hr capsule Take 240 mg by mouth every morning.     Marland Kitchen ibuprofen (ADVIL,MOTRIN) 200 MG tablet Take 400 mg by mouth every 6 (six) hours as needed. For pain     . levothyroxine (SYNTHROID, LEVOTHROID) 88 MCG tablet Take 88 mcg by mouth every morning.     . Multiple Vitamins-Minerals (HAIR/SKIN/NAILS PO) Take 1 tablet by mouth daily.    . NON FORMULARY Inject 1 Syringe into the muscle once a week. Allergy injections weekly    . Omega-3 Fatty Acids (FISH OIL) 1200 MG CAPS Take 1,200 mg by mouth daily.     . ALBUTEROL SULFATE HFA IN Inhale 2 puffs into the lungs every 4 (four) hours as needed.    . budesonide-formoterol (SYMBICORT) 160-4.5 MCG/ACT inhaler Inhale 2 puffs into the lungs 2 (two) times daily as needed. For shortness of breath    . traMADol (ULTRAM) 50 MG tablet Take 1-2 tablets (50-100 mg total) by mouth every 6 (six) hours as needed for moderate pain. (Patient not taking: Reported on 04/07/2017) 30 tablet 0   No current facility-administered medications for this encounter.      REVIEW OF SYSTEMS: On review of systems, the patient reports that she is doing well overall. She denies any chest pain, shortness of breath, cough, fevers, chills, night sweats, unintended weight  changes. She denies any bowel or bladder disturbances, and denies abdominal pain, nausea or vomiting. She denies any new musculoskeletal or joint aches or pains. He only complaint is left breast tenderness. A complete review of systems is obtained and is otherwise negative.     PHYSICAL EXAM:  Wt Readings from Last 3 Encounters:  04/07/17 178 lb 6.4 oz (80.9 kg)  03/03/17 174 lb (78.9 kg)  01/28/17 177 lb 1.6 oz (80.3 kg)   Temp Readings from Last 3 Encounters:  04/07/17 98.1 F (36.7 C) (Oral)  03/04/17 97.6 F (36.4 C)  01/28/17 97.7 F (36.5 C) (Oral)   BP Readings from Last 3 Encounters:  04/07/17 (!) 159/80  03/04/17 (!) 143/66  01/28/17 (!) 178/81   Pulse Readings from Last 3 Encounters:  04/07/17 78  03/04/17 68  01/28/17 83   Pain Assessment Pain Score: 0-No pain/10  In general this is a well appearing  elderly Caucasian female in no acute distress. She is alert and oriented x4 and appropriate throughout the examination. HEENT reveals that the patient is normocephalic, atraumatic. EOMs are intact. PERRLA. Skin is intact without any evidence of gross lesions. Cardiovascular exam reveals a regular rate and rhythm, no clicks rubs or murmurs are auscultated. Chest is clear to auscultation bilaterally. Lymphatic assessment is performed and does not reveal any adenopathy in the cervical, supraclavicular, axillary, or inguinal chains. Bilateral breast exam is performed and does not reveal any palpable abnormalities of the right breast. The left breast reveals two well healed left lumpectomy scars. No erythema, separation, or drainage is noted. No nipple bleeding or discharge is noted. Abdomen has active bowel sounds in all quadrants and is intact. The abdomen is soft, non tender, non distended. Lower extremities are negative for pretibial pitting edema, deep calf tenderness, cyanosis or clubbing.    ECOG = 0  0 - Asymptomatic (Fully active, able to carry on all predisease  activities without restriction)  1 - Symptomatic but completely ambulatory (Restricted in physically strenuous activity but ambulatory and able to carry out work of a light or sedentary nature. For example, light housework, office work)  2 - Symptomatic, <50% in bed during the day (Ambulatory and capable of all self care but unable to carry out any work activities. Up and about more than 50% of waking hours)  3 - Symptomatic, >50% in bed, but not bedbound (Capable of only limited self-care, confined to bed or chair 50% or more of waking hours)  4 - Bedbound (Completely disabled. Cannot carry on any self-care. Totally confined to bed or chair)  5 - Death   Eustace Pen MM, Creech RH, Tormey DC, et al. [phone]). "Toxicity and response criteria of the [facility]". [place] Oncol. 5 (6): 649-55    LABORATORY DATA:  Lab Results  Component Value Date   WBC 7.4 01/28/2017   HGB 15.0 01/28/2017   HCT 45.1 01/28/2017   MCV 90.6 01/28/2017   PLT 202 01/28/2017   Lab Results  Component Value Date   NA 140 02/25/2017   K 4.3 02/25/2017   CL 104 02/25/2017   CO2 27 02/25/2017   Lab Results  Component Value Date   ALT 14 01/28/2017   AST 17 01/28/2017   ALKPHOS 80 01/28/2017   BILITOT 0.51 01/28/2017      RADIOGRAPHY: No results found.     IMPRESSION/PLAN: 1. Stage IA, pT1c,pN0, grade 1 ER/PR positive, invasive lobular carcinoma of the left breast. Dr. Lisbeth Renshaw  discussed the pathology findings and reviewed the nature of breast cancer. The patient has done well since surgery and her Oncotype DX testing was on the lower spectrum of intermediate and chemotherapy was not strongly recommended. The patient's course would be benefited by radiotherapy given the size and intermediate risk of her oncotype testing. We discussed that following external radiotherapy, Dr. Jana Hakim has recommended antiestrogen therapy. We discussed the risks, benefits, short, and long term effects of  radiotherapy, and the patient is interested in proceeding. Dr. Lisbeth Renshaw discussed the delivery and logistics of radiotherapy, and Dr. Lisbeth Renshaw has recommended a course of 4 weeks of radiation. The patient signed a consent form and a copy was placed in her medical chart. We will go ahead and plan to move forward with simulation to begin treatment in the next 1-2 weeks.  In a visit lasting 30 minutes, greater than 50% of the time was spent face to face discussing the  role and side effects of radiation and antiestrogen therapy, and coordinating the patient's care.   The above documentation reflects my direct findings during this shared patient visit. Please see the separate note by Dr. Lisbeth Renshaw on this date for the remainder of the patient's plan of care.    Carola Rhine, PAC  This document serves as a record of services personally performed by Shona Simpson, PA-C and Kyung Rudd, MD. It was created on their behalf by Darcus Austin, a trained medical scribe. The creation of this record is based on the scribe's personal observations and the providers' statements to them. This document has been checked and approved by the attending provider.

## 2017-04-08 ENCOUNTER — Ambulatory Visit
Admission: RE | Admit: 2017-04-08 | Discharge: 2017-04-08 | Disposition: A | Discharge status: Home / Self Care | Payer: Medicare Other | Source: Ambulatory Visit | Attending: Radiation Oncology | Admitting: Radiation Oncology

## 2017-04-08 ENCOUNTER — Other Ambulatory Visit: Payer: Self-pay | Admitting: Oncology

## 2017-04-08 DIAGNOSIS — C50412 Malignant neoplasm of upper-outer quadrant of left female breast: Secondary | ICD-10-CM

## 2017-04-08 DIAGNOSIS — Z510 Encounter for antineoplastic radiation therapy: Secondary | ICD-10-CM | POA: Diagnosis not present

## 2017-04-08 DIAGNOSIS — J45909 Unspecified asthma, uncomplicated: Secondary | ICD-10-CM | POA: Diagnosis not present

## 2017-04-08 DIAGNOSIS — K219 Gastro-esophageal reflux disease without esophagitis: Secondary | ICD-10-CM | POA: Diagnosis not present

## 2017-04-08 DIAGNOSIS — E785 Hyperlipidemia, unspecified: Secondary | ICD-10-CM | POA: Diagnosis not present

## 2017-04-08 DIAGNOSIS — J3089 Other allergic rhinitis: Secondary | ICD-10-CM | POA: Diagnosis not present

## 2017-04-08 DIAGNOSIS — Z170 Estrogen receptor positive status [ER+]: Principal | ICD-10-CM

## 2017-04-08 DIAGNOSIS — J3081 Allergic rhinitis due to animal (cat) (dog) hair and dander: Secondary | ICD-10-CM | POA: Diagnosis not present

## 2017-04-08 NOTE — Progress Notes (Signed)
  Radiation Oncology         [phone] ________________________________  Name: Erin Novak MRN: [id number]  Date: 04/08/2017  DOB: 1941-09-22   DIAGNOSIS:     ICD-9-CM ICD-10-CM   1. Malignant neoplasm of upper-outer quadrant of left breast in female, estrogen receptor positive ([place]) 174.4 C50.412    V86.0 Z17.0     SIMULATION AND TREATMENT PLANNING NOTE  The patient presented for simulation prior to beginning her course of radiation treatment for her diagnosis of left-sided breast cancer. The patient was placed in a supine position on a breast board. A customized vac-lock bag was constructed and this complex treatment device will be used on a daily basis during her treatment. In this fashion, a CT scan was obtained through the chest area and an isocenter was placed near the chest wall within the breast.  The patient will be planned to receive a course of radiation initially to a dose of 42.5 Gy. This will consist of a whole breast radiotherapy technique. To accomplish this, 2 customized blocks have been designed which will correspond to medial and lateral whole breast tangent fields. This treatment will be accomplished at 2.5 Gy per fraction. A forward planning technique will also be evaluated to determine if this approach improves the plan. It is anticipated that the patient will then receive a 7.5 Gy boost to the seroma cavity which has been contoured. This will be accomplished at 2.5 Gy per fraction.   This initial treatment will consist of a 3-D conformal technique. The seroma has been contoured as the primary target structure. Additionally, dose volume histograms of both this target as well as the lungs and heart will also be evaluated. Such an approach is necessary to ensure that the target area is adequately covered while the nearby critical  normal structures are adequately spared.  Plan:  The final anticipated total dose therefore will correspond to 50 Gy.   Special treatment  procedure was performed today due to the extra time and effort required by myself to plan and prepare this patient for deep inspiration breath hold technique.  I have determined cardiac sparing to be of benefit to this patient to prevent long term cardiac damage due to radiation of the heart.  Bellows were placed on the patient's abdomen. To facilitate cardiac sparing, the patient was coached by the radiation therapists on breath hold techniques and breathing practice was performed. Practice waveforms were obtained. The patient was then scanned while maintaining breath hold in the treatment position.  This image was then transferred over to the imaging specialist. The imaging specialist then created a fusion of the free breathing and breath hold scans using the chest wall as the stable structure. I personally reviewed the fusion in axial, coronal and sagittal image planes.  Excellent cardiac sparing was obtained.  I felt the patient is an appropriate candidate for breath hold and the patient will be treated as such.  The image fusion was then reviewed with the patient to reinforce the necessity of reproducible breath hold.      _______________________________   Jodelle Gross, MD, PhD

## 2017-04-08 NOTE — Progress Notes (Unsigned)
Marianne's Oncotype score fell in on the low side (low risk side) of intermediate range. This was discussed with her at radiation oncology today and she was offered chemotherapy for a marginal benefit in the 3% range. She had think reasonably has declined. Accordingly she is proceeding to radiation and she will see me again at the completion of those treatments.

## 2017-04-08 NOTE — Progress Notes (Signed)
  Radiation Oncology         [phone] ________________________________  Name: Erin Novak MRN: [id number]  Date: 04/08/2017  DOB: Jul 21, 1941  Optical Surface Tracking Plan:  Since intensity modulated radiotherapy (IMRT) and 3D conformal radiation treatment methods are predicated on accurate and precise positioning for treatment, intrafraction motion monitoring is medically necessary to ensure accurate and safe treatment delivery.  The ability to quantify intrafraction motion without excessive ionizing radiation dose can only be performed with optical surface tracking. Accordingly, surface imaging offers the opportunity to obtain 3D measurements of patient position throughout IMRT and 3D treatments without excessive radiation exposure.  I am ordering optical surface tracking for this patient's upcoming course of radiotherapy. ________________________________  Kyung Rudd, MD 04/08/2017 8:25 PM    Reference:   Ursula Alert, J, et al. Surface imaging-based analysis of intrafraction motion for breast radiotherapy patients.Journal of [place], n. 6, nov. 2014. ISSN [id number].   Available at: <http://www.jacmp.org/index.php/jacmp/article/view/4957>.

## 2017-04-09 DIAGNOSIS — E785 Hyperlipidemia, unspecified: Secondary | ICD-10-CM | POA: Diagnosis not present

## 2017-04-09 DIAGNOSIS — J45909 Unspecified asthma, uncomplicated: Secondary | ICD-10-CM | POA: Diagnosis not present

## 2017-04-09 DIAGNOSIS — Z170 Estrogen receptor positive status [ER+]: Secondary | ICD-10-CM | POA: Diagnosis not present

## 2017-04-09 DIAGNOSIS — Z510 Encounter for antineoplastic radiation therapy: Secondary | ICD-10-CM | POA: Diagnosis not present

## 2017-04-09 DIAGNOSIS — K219 Gastro-esophageal reflux disease without esophagitis: Secondary | ICD-10-CM | POA: Diagnosis not present

## 2017-04-09 DIAGNOSIS — C50412 Malignant neoplasm of upper-outer quadrant of left female breast: Secondary | ICD-10-CM | POA: Diagnosis not present

## 2017-04-17 ENCOUNTER — Ambulatory Visit
Admission: RE | Admit: 2017-04-17 | Discharge: 2017-04-17 | Disposition: A | Discharge status: Home / Self Care | Payer: Medicare Other | Source: Ambulatory Visit | Attending: Radiation Oncology | Admitting: Radiation Oncology

## 2017-04-17 DIAGNOSIS — K219 Gastro-esophageal reflux disease without esophagitis: Secondary | ICD-10-CM | POA: Diagnosis not present

## 2017-04-17 DIAGNOSIS — J3089 Other allergic rhinitis: Secondary | ICD-10-CM | POA: Diagnosis not present

## 2017-04-17 DIAGNOSIS — J3081 Allergic rhinitis due to animal (cat) (dog) hair and dander: Secondary | ICD-10-CM | POA: Diagnosis not present

## 2017-04-17 DIAGNOSIS — Z510 Encounter for antineoplastic radiation therapy: Secondary | ICD-10-CM | POA: Diagnosis not present

## 2017-04-17 DIAGNOSIS — J45909 Unspecified asthma, uncomplicated: Secondary | ICD-10-CM | POA: Diagnosis not present

## 2017-04-17 DIAGNOSIS — C50412 Malignant neoplasm of upper-outer quadrant of left female breast: Secondary | ICD-10-CM | POA: Diagnosis not present

## 2017-04-17 DIAGNOSIS — E785 Hyperlipidemia, unspecified: Secondary | ICD-10-CM | POA: Diagnosis not present

## 2017-04-17 DIAGNOSIS — Z170 Estrogen receptor positive status [ER+]: Secondary | ICD-10-CM | POA: Diagnosis not present

## 2017-04-20 ENCOUNTER — Ambulatory Visit
Admission: RE | Admit: 2017-04-20 | Discharge: 2017-04-20 | Disposition: A | Discharge status: Home / Self Care | Payer: Medicare Other | Source: Ambulatory Visit | Attending: Radiation Oncology | Admitting: Radiation Oncology

## 2017-04-20 DIAGNOSIS — Z510 Encounter for antineoplastic radiation therapy: Secondary | ICD-10-CM | POA: Diagnosis not present

## 2017-04-20 DIAGNOSIS — E785 Hyperlipidemia, unspecified: Secondary | ICD-10-CM | POA: Diagnosis not present

## 2017-04-20 DIAGNOSIS — K219 Gastro-esophageal reflux disease without esophagitis: Secondary | ICD-10-CM | POA: Diagnosis not present

## 2017-04-20 DIAGNOSIS — J45909 Unspecified asthma, uncomplicated: Secondary | ICD-10-CM | POA: Diagnosis not present

## 2017-04-20 DIAGNOSIS — C50412 Malignant neoplasm of upper-outer quadrant of left female breast: Secondary | ICD-10-CM | POA: Diagnosis not present

## 2017-04-20 DIAGNOSIS — Z170 Estrogen receptor positive status [ER+]: Secondary | ICD-10-CM | POA: Diagnosis not present

## 2017-04-21 ENCOUNTER — Ambulatory Visit
Admission: RE | Admit: 2017-04-21 | Discharge: 2017-04-21 | Disposition: A | Discharge status: Home / Self Care | Payer: Medicare Other | Source: Ambulatory Visit | Attending: Radiation Oncology | Admitting: Radiation Oncology

## 2017-04-21 DIAGNOSIS — J45909 Unspecified asthma, uncomplicated: Secondary | ICD-10-CM | POA: Diagnosis not present

## 2017-04-21 DIAGNOSIS — C50412 Malignant neoplasm of upper-outer quadrant of left female breast: Secondary | ICD-10-CM

## 2017-04-21 DIAGNOSIS — Z170 Estrogen receptor positive status [ER+]: Principal | ICD-10-CM

## 2017-04-21 DIAGNOSIS — Z510 Encounter for antineoplastic radiation therapy: Secondary | ICD-10-CM | POA: Diagnosis not present

## 2017-04-21 DIAGNOSIS — J3081 Allergic rhinitis due to animal (cat) (dog) hair and dander: Secondary | ICD-10-CM | POA: Diagnosis not present

## 2017-04-21 DIAGNOSIS — E785 Hyperlipidemia, unspecified: Secondary | ICD-10-CM | POA: Diagnosis not present

## 2017-04-21 DIAGNOSIS — J3089 Other allergic rhinitis: Secondary | ICD-10-CM | POA: Diagnosis not present

## 2017-04-21 DIAGNOSIS — K219 Gastro-esophageal reflux disease without esophagitis: Secondary | ICD-10-CM | POA: Diagnosis not present

## 2017-04-21 MED ORDER — ALRA NON-METALLIC DEODORANT (RAD-ONC)
1.0000 "application " | Freq: Once | TOPICAL | Status: AC
  Administered 2017-04-21: 1 via TOPICAL

## 2017-04-21 MED ORDER — RADIAPLEXRX EX GEL
Freq: Once | CUTANEOUS | Status: AC
  Administered 2017-04-21: 12:00:00 via TOPICAL

## 2017-04-21 NOTE — Progress Notes (Signed)
Breast pt education done, gave my business card, alra deodorant, radiaplex gel, and Radiation therapy and you book, discussed skin irritation,pain, sharp, , swelling, tenderness,, fatigue. use luke warm shower/bath, pat dry, unscented dove preferred, no under wire bras, no rubbing,scrubbing or scratching breast area, , increase protein in diet, stay hydrated, drink plenty water, use radiaplex on breat bid after rad tx and bedtime, just nothing 4 hours prior to rad txs,teach back given 1:13 PM

## 2017-04-22 ENCOUNTER — Ambulatory Visit
Admission: RE | Admit: 2017-04-22 | Discharge: 2017-04-22 | Disposition: A | Discharge status: Home / Self Care | Payer: Medicare Other | Source: Ambulatory Visit | Attending: Radiation Oncology | Admitting: Radiation Oncology

## 2017-04-22 DIAGNOSIS — K219 Gastro-esophageal reflux disease without esophagitis: Secondary | ICD-10-CM | POA: Diagnosis not present

## 2017-04-22 DIAGNOSIS — J45909 Unspecified asthma, uncomplicated: Secondary | ICD-10-CM | POA: Diagnosis not present

## 2017-04-22 DIAGNOSIS — E785 Hyperlipidemia, unspecified: Secondary | ICD-10-CM | POA: Diagnosis not present

## 2017-04-22 DIAGNOSIS — Z170 Estrogen receptor positive status [ER+]: Secondary | ICD-10-CM | POA: Diagnosis not present

## 2017-04-22 DIAGNOSIS — Z510 Encounter for antineoplastic radiation therapy: Secondary | ICD-10-CM | POA: Diagnosis not present

## 2017-04-22 DIAGNOSIS — C50412 Malignant neoplasm of upper-outer quadrant of left female breast: Secondary | ICD-10-CM | POA: Diagnosis not present

## 2017-04-23 ENCOUNTER — Ambulatory Visit
Admission: RE | Admit: 2017-04-23 | Discharge: 2017-04-23 | Disposition: A | Discharge status: Home / Self Care | Payer: Medicare Other | Source: Ambulatory Visit | Attending: Radiation Oncology | Admitting: Radiation Oncology

## 2017-04-23 DIAGNOSIS — K219 Gastro-esophageal reflux disease without esophagitis: Secondary | ICD-10-CM | POA: Diagnosis not present

## 2017-04-23 DIAGNOSIS — E785 Hyperlipidemia, unspecified: Secondary | ICD-10-CM | POA: Diagnosis not present

## 2017-04-23 DIAGNOSIS — Z510 Encounter for antineoplastic radiation therapy: Secondary | ICD-10-CM | POA: Diagnosis not present

## 2017-04-23 DIAGNOSIS — C50412 Malignant neoplasm of upper-outer quadrant of left female breast: Secondary | ICD-10-CM | POA: Diagnosis not present

## 2017-04-23 DIAGNOSIS — Z170 Estrogen receptor positive status [ER+]: Secondary | ICD-10-CM | POA: Diagnosis not present

## 2017-04-23 DIAGNOSIS — J45909 Unspecified asthma, uncomplicated: Secondary | ICD-10-CM | POA: Diagnosis not present

## 2017-04-24 ENCOUNTER — Ambulatory Visit
Admission: RE | Admit: 2017-04-24 | Discharge: 2017-04-24 | Disposition: A | Discharge status: Home / Self Care | Payer: Medicare Other | Source: Ambulatory Visit | Attending: Radiation Oncology | Admitting: Radiation Oncology

## 2017-04-24 DIAGNOSIS — Z170 Estrogen receptor positive status [ER+]: Secondary | ICD-10-CM | POA: Diagnosis not present

## 2017-04-24 DIAGNOSIS — J45909 Unspecified asthma, uncomplicated: Secondary | ICD-10-CM | POA: Diagnosis not present

## 2017-04-24 DIAGNOSIS — J3089 Other allergic rhinitis: Secondary | ICD-10-CM | POA: Diagnosis not present

## 2017-04-24 DIAGNOSIS — C50412 Malignant neoplasm of upper-outer quadrant of left female breast: Secondary | ICD-10-CM | POA: Diagnosis not present

## 2017-04-24 DIAGNOSIS — K219 Gastro-esophageal reflux disease without esophagitis: Secondary | ICD-10-CM | POA: Diagnosis not present

## 2017-04-24 DIAGNOSIS — Z510 Encounter for antineoplastic radiation therapy: Secondary | ICD-10-CM | POA: Diagnosis not present

## 2017-04-24 DIAGNOSIS — J3081 Allergic rhinitis due to animal (cat) (dog) hair and dander: Secondary | ICD-10-CM | POA: Diagnosis not present

## 2017-04-24 DIAGNOSIS — E785 Hyperlipidemia, unspecified: Secondary | ICD-10-CM | POA: Diagnosis not present

## 2017-04-27 ENCOUNTER — Ambulatory Visit
Admission: RE | Admit: 2017-04-27 | Discharge: 2017-04-27 | Disposition: A | Discharge status: Home / Self Care | Payer: Medicare Other | Source: Ambulatory Visit | Attending: Radiation Oncology | Admitting: Radiation Oncology

## 2017-04-27 DIAGNOSIS — Z170 Estrogen receptor positive status [ER+]: Secondary | ICD-10-CM | POA: Diagnosis not present

## 2017-04-27 DIAGNOSIS — E785 Hyperlipidemia, unspecified: Secondary | ICD-10-CM | POA: Diagnosis not present

## 2017-04-27 DIAGNOSIS — K219 Gastro-esophageal reflux disease without esophagitis: Secondary | ICD-10-CM | POA: Diagnosis not present

## 2017-04-27 DIAGNOSIS — J45909 Unspecified asthma, uncomplicated: Secondary | ICD-10-CM | POA: Diagnosis not present

## 2017-04-27 DIAGNOSIS — Z510 Encounter for antineoplastic radiation therapy: Secondary | ICD-10-CM | POA: Diagnosis not present

## 2017-04-27 DIAGNOSIS — C50412 Malignant neoplasm of upper-outer quadrant of left female breast: Secondary | ICD-10-CM | POA: Diagnosis not present

## 2017-04-28 ENCOUNTER — Ambulatory Visit
Admission: RE | Admit: 2017-04-28 | Discharge: 2017-04-28 | Disposition: A | Discharge status: Home / Self Care | Payer: Medicare Other | Source: Ambulatory Visit | Attending: Radiation Oncology | Admitting: Radiation Oncology

## 2017-04-28 DIAGNOSIS — J3081 Allergic rhinitis due to animal (cat) (dog) hair and dander: Secondary | ICD-10-CM | POA: Diagnosis not present

## 2017-04-28 DIAGNOSIS — K219 Gastro-esophageal reflux disease without esophagitis: Secondary | ICD-10-CM | POA: Diagnosis not present

## 2017-04-28 DIAGNOSIS — C50412 Malignant neoplasm of upper-outer quadrant of left female breast: Secondary | ICD-10-CM | POA: Diagnosis not present

## 2017-04-28 DIAGNOSIS — E785 Hyperlipidemia, unspecified: Secondary | ICD-10-CM | POA: Diagnosis not present

## 2017-04-28 DIAGNOSIS — J3089 Other allergic rhinitis: Secondary | ICD-10-CM | POA: Diagnosis not present

## 2017-04-28 DIAGNOSIS — Z510 Encounter for antineoplastic radiation therapy: Secondary | ICD-10-CM | POA: Diagnosis not present

## 2017-04-28 DIAGNOSIS — Z170 Estrogen receptor positive status [ER+]: Secondary | ICD-10-CM | POA: Diagnosis not present

## 2017-04-28 DIAGNOSIS — J45909 Unspecified asthma, uncomplicated: Secondary | ICD-10-CM | POA: Diagnosis not present

## 2017-04-29 ENCOUNTER — Ambulatory Visit
Admission: RE | Admit: 2017-04-29 | Discharge: 2017-04-29 | Disposition: A | Discharge status: Home / Self Care | Payer: Medicare Other | Source: Ambulatory Visit | Attending: Radiation Oncology | Admitting: Radiation Oncology

## 2017-04-29 DIAGNOSIS — C50412 Malignant neoplasm of upper-outer quadrant of left female breast: Secondary | ICD-10-CM | POA: Diagnosis not present

## 2017-04-29 DIAGNOSIS — K219 Gastro-esophageal reflux disease without esophagitis: Secondary | ICD-10-CM | POA: Diagnosis not present

## 2017-04-29 DIAGNOSIS — J45909 Unspecified asthma, uncomplicated: Secondary | ICD-10-CM | POA: Diagnosis not present

## 2017-04-29 DIAGNOSIS — Z170 Estrogen receptor positive status [ER+]: Principal | ICD-10-CM

## 2017-04-29 DIAGNOSIS — E785 Hyperlipidemia, unspecified: Secondary | ICD-10-CM | POA: Diagnosis not present

## 2017-04-29 DIAGNOSIS — Z510 Encounter for antineoplastic radiation therapy: Secondary | ICD-10-CM | POA: Diagnosis not present

## 2017-04-29 NOTE — Progress Notes (Signed)
   Department of Radiation Oncology  Phone:  [phone] Fax:        [phone]  Weekly Treatment Note    Name: Erin Novak Date: 04/29/2017 MRN: [id number] DOB: [birth year]   Diagnosis:     ICD-10-CM   1. Malignant neoplasm of upper-outer quadrant of left breast in female, estrogen receptor positive ([place]) C50.412    Z17.0      Current dose: 20 Gy  Current fraction: 8   MEDICATIONS: Current Outpatient Prescriptions  Medication Sig Dispense Refill  . ALBUTEROL SULFATE HFA IN Inhale 2 puffs into the lungs every 4 (four) hours as needed.    . budesonide-formoterol (SYMBICORT) 160-4.5 MCG/ACT inhaler Inhale 2 puffs into the lungs 2 (two) times daily as needed. For shortness of breath    . Cholecalciferol (VITAMIN D-3) 5000 UNITS TABS Take 5,000 Units by mouth daily.     Marland Kitchen diltiazem (TIAZAC) 240 MG 24 hr capsule Take 240 mg by mouth every morning.     . hyaluronate sodium (RADIAPLEXRX) GEL Apply 1 application topically 2 (two) times daily.    Marland Kitchen ibuprofen (ADVIL,MOTRIN) 200 MG tablet Take 400 mg by mouth every 6 (six) hours as needed. For pain     . levothyroxine (SYNTHROID, LEVOTHROID) 88 MCG tablet Take 88 mcg by mouth every morning.     . Multiple Vitamins-Minerals (HAIR/SKIN/NAILS PO) Take 1 tablet by mouth daily.    . NON FORMULARY Inject 1 Syringe into the muscle once a week. Allergy injections weekly    . non-metallic deodorant (ALRA) MISC Apply 1 application topically daily.    . Omega-3 Fatty Acids (FISH OIL) 1200 MG CAPS Take 1,200 mg by mouth daily.     . traMADol (ULTRAM) 50 MG tablet Take 1-2 tablets (50-100 mg total) by mouth every 6 (six) hours as needed for moderate pain. (Patient not taking: Reported on 04/07/2017) 30 tablet 0   No current facility-administered medications for this encounter.      ALLERGIES: Amoxicillin-pot clavulanate; Amoxicillin-pot clavulanate; Lisinopril; Percodan [oxycodone-aspirin]; and Prednisone   LABORATORY DATA:  Lab Results    Component Value Date   WBC 7.4 01/28/2017   HGB 15.0 01/28/2017   HCT 45.1 01/28/2017   MCV 90.6 01/28/2017   PLT 202 01/28/2017   Lab Results  Component Value Date   NA 140 02/25/2017   K 4.3 02/25/2017   CL 104 02/25/2017   CO2 27 02/25/2017   Lab Results  Component Value Date   ALT 14 01/28/2017   AST 17 01/28/2017   ALKPHOS 80 01/28/2017   BILITOT 0.51 01/28/2017     NARRATIVE: Erin Novak was seen today for weekly treatment management. The chart was checked and the patient's films were reviewed.  8/20 fractions completed to the left breast. The patient denies pain, skin changes, or irriation of the left breast.  PHYSICAL EXAMINATION: vitals were not taken for this visit.  Alert and oriented x3. In no distress.  ASSESSMENT: The patient is doing satisfactorily with treatment.  PLAN: We will continue with the patient's radiation treatment as planned.   This document serves as a record of services personally performed by Kyung Rudd, MD. It was created on his behalf by Darcus Austin, a trained medical scribe. The creation of this record is based on the scribe's personal observations and the provider's statements to them. This document has been checked and approved by the attending provider.

## 2017-04-30 ENCOUNTER — Ambulatory Visit
Admission: RE | Admit: 2017-04-30 | Discharge: 2017-04-30 | Disposition: A | Discharge status: Home / Self Care | Payer: Medicare Other | Source: Ambulatory Visit | Attending: Radiation Oncology | Admitting: Radiation Oncology

## 2017-04-30 DIAGNOSIS — Z510 Encounter for antineoplastic radiation therapy: Secondary | ICD-10-CM | POA: Diagnosis not present

## 2017-04-30 DIAGNOSIS — K219 Gastro-esophageal reflux disease without esophagitis: Secondary | ICD-10-CM | POA: Diagnosis not present

## 2017-04-30 DIAGNOSIS — J45909 Unspecified asthma, uncomplicated: Secondary | ICD-10-CM | POA: Diagnosis not present

## 2017-04-30 DIAGNOSIS — E785 Hyperlipidemia, unspecified: Secondary | ICD-10-CM | POA: Diagnosis not present

## 2017-04-30 DIAGNOSIS — C50412 Malignant neoplasm of upper-outer quadrant of left female breast: Secondary | ICD-10-CM | POA: Diagnosis not present

## 2017-04-30 DIAGNOSIS — Z170 Estrogen receptor positive status [ER+]: Secondary | ICD-10-CM | POA: Diagnosis not present

## 2017-05-01 ENCOUNTER — Ambulatory Visit
Admission: RE | Admit: 2017-05-01 | Discharge: 2017-05-01 | Disposition: A | Discharge status: Home / Self Care | Payer: Medicare Other | Source: Ambulatory Visit | Attending: Radiation Oncology | Admitting: Radiation Oncology

## 2017-05-01 DIAGNOSIS — C50412 Malignant neoplasm of upper-outer quadrant of left female breast: Secondary | ICD-10-CM | POA: Diagnosis not present

## 2017-05-01 DIAGNOSIS — Z170 Estrogen receptor positive status [ER+]: Secondary | ICD-10-CM | POA: Diagnosis not present

## 2017-05-01 DIAGNOSIS — J3089 Other allergic rhinitis: Secondary | ICD-10-CM | POA: Diagnosis not present

## 2017-05-01 DIAGNOSIS — Z510 Encounter for antineoplastic radiation therapy: Secondary | ICD-10-CM | POA: Diagnosis not present

## 2017-05-01 DIAGNOSIS — J45909 Unspecified asthma, uncomplicated: Secondary | ICD-10-CM | POA: Diagnosis not present

## 2017-05-01 DIAGNOSIS — K219 Gastro-esophageal reflux disease without esophagitis: Secondary | ICD-10-CM | POA: Diagnosis not present

## 2017-05-01 DIAGNOSIS — J3081 Allergic rhinitis due to animal (cat) (dog) hair and dander: Secondary | ICD-10-CM | POA: Diagnosis not present

## 2017-05-01 DIAGNOSIS — E785 Hyperlipidemia, unspecified: Secondary | ICD-10-CM | POA: Diagnosis not present

## 2017-05-04 ENCOUNTER — Ambulatory Visit
Admission: RE | Admit: 2017-05-04 | Discharge: 2017-05-04 | Disposition: A | Discharge status: Home / Self Care | Payer: Medicare Other | Source: Ambulatory Visit | Attending: Radiation Oncology | Admitting: Radiation Oncology

## 2017-05-04 DIAGNOSIS — J45909 Unspecified asthma, uncomplicated: Secondary | ICD-10-CM | POA: Diagnosis not present

## 2017-05-04 DIAGNOSIS — E785 Hyperlipidemia, unspecified: Secondary | ICD-10-CM | POA: Diagnosis not present

## 2017-05-04 DIAGNOSIS — K219 Gastro-esophageal reflux disease without esophagitis: Secondary | ICD-10-CM | POA: Diagnosis not present

## 2017-05-04 DIAGNOSIS — C50412 Malignant neoplasm of upper-outer quadrant of left female breast: Secondary | ICD-10-CM | POA: Diagnosis not present

## 2017-05-04 DIAGNOSIS — Z170 Estrogen receptor positive status [ER+]: Secondary | ICD-10-CM | POA: Diagnosis not present

## 2017-05-04 DIAGNOSIS — J3081 Allergic rhinitis due to animal (cat) (dog) hair and dander: Secondary | ICD-10-CM | POA: Diagnosis not present

## 2017-05-04 DIAGNOSIS — J3089 Other allergic rhinitis: Secondary | ICD-10-CM | POA: Diagnosis not present

## 2017-05-04 DIAGNOSIS — Z510 Encounter for antineoplastic radiation therapy: Secondary | ICD-10-CM | POA: Diagnosis not present

## 2017-05-05 ENCOUNTER — Ambulatory Visit
Admission: RE | Admit: 2017-05-05 | Discharge: 2017-05-05 | Disposition: A | Discharge status: Home / Self Care | Payer: Medicare Other | Source: Ambulatory Visit | Attending: Radiation Oncology | Admitting: Radiation Oncology

## 2017-05-05 DIAGNOSIS — C50412 Malignant neoplasm of upper-outer quadrant of left female breast: Secondary | ICD-10-CM | POA: Diagnosis not present

## 2017-05-05 DIAGNOSIS — Z170 Estrogen receptor positive status [ER+]: Secondary | ICD-10-CM | POA: Diagnosis not present

## 2017-05-05 DIAGNOSIS — J45909 Unspecified asthma, uncomplicated: Secondary | ICD-10-CM | POA: Diagnosis not present

## 2017-05-05 DIAGNOSIS — E785 Hyperlipidemia, unspecified: Secondary | ICD-10-CM | POA: Diagnosis not present

## 2017-05-05 DIAGNOSIS — K219 Gastro-esophageal reflux disease without esophagitis: Secondary | ICD-10-CM | POA: Diagnosis not present

## 2017-05-05 DIAGNOSIS — Z510 Encounter for antineoplastic radiation therapy: Secondary | ICD-10-CM | POA: Diagnosis not present

## 2017-05-06 ENCOUNTER — Ambulatory Visit
Admission: RE | Admit: 2017-05-06 | Discharge: 2017-05-06 | Disposition: A | Discharge status: Home / Self Care | Payer: Medicare Other | Source: Ambulatory Visit | Attending: Radiation Oncology | Admitting: Radiation Oncology

## 2017-05-06 DIAGNOSIS — C50412 Malignant neoplasm of upper-outer quadrant of left female breast: Secondary | ICD-10-CM | POA: Diagnosis not present

## 2017-05-06 DIAGNOSIS — E785 Hyperlipidemia, unspecified: Secondary | ICD-10-CM | POA: Diagnosis not present

## 2017-05-06 DIAGNOSIS — K219 Gastro-esophageal reflux disease without esophagitis: Secondary | ICD-10-CM | POA: Diagnosis not present

## 2017-05-06 DIAGNOSIS — Z170 Estrogen receptor positive status [ER+]: Secondary | ICD-10-CM | POA: Diagnosis not present

## 2017-05-06 DIAGNOSIS — Z510 Encounter for antineoplastic radiation therapy: Secondary | ICD-10-CM | POA: Diagnosis not present

## 2017-05-06 DIAGNOSIS — J45909 Unspecified asthma, uncomplicated: Secondary | ICD-10-CM | POA: Diagnosis not present

## 2017-05-07 ENCOUNTER — Ambulatory Visit: Payer: Medicare Other | Admitting: Radiation Oncology

## 2017-05-07 ENCOUNTER — Ambulatory Visit
Admission: RE | Admit: 2017-05-07 | Discharge: 2017-05-07 | Disposition: A | Discharge status: Home / Self Care | Payer: Medicare Other | Source: Ambulatory Visit | Attending: Radiation Oncology | Admitting: Radiation Oncology

## 2017-05-07 DIAGNOSIS — K219 Gastro-esophageal reflux disease without esophagitis: Secondary | ICD-10-CM | POA: Diagnosis not present

## 2017-05-07 DIAGNOSIS — Z510 Encounter for antineoplastic radiation therapy: Secondary | ICD-10-CM | POA: Diagnosis not present

## 2017-05-07 DIAGNOSIS — C50412 Malignant neoplasm of upper-outer quadrant of left female breast: Secondary | ICD-10-CM | POA: Diagnosis not present

## 2017-05-07 DIAGNOSIS — Z170 Estrogen receptor positive status [ER+]: Secondary | ICD-10-CM | POA: Diagnosis not present

## 2017-05-07 DIAGNOSIS — E785 Hyperlipidemia, unspecified: Secondary | ICD-10-CM | POA: Diagnosis not present

## 2017-05-07 DIAGNOSIS — J45909 Unspecified asthma, uncomplicated: Secondary | ICD-10-CM | POA: Diagnosis not present

## 2017-05-08 ENCOUNTER — Ambulatory Visit
Admission: RE | Admit: 2017-05-08 | Discharge: 2017-05-08 | Disposition: A | Discharge status: Home / Self Care | Payer: Medicare Other | Source: Ambulatory Visit | Attending: Radiation Oncology | Admitting: Radiation Oncology

## 2017-05-08 DIAGNOSIS — Z510 Encounter for antineoplastic radiation therapy: Secondary | ICD-10-CM | POA: Diagnosis not present

## 2017-05-08 DIAGNOSIS — J45909 Unspecified asthma, uncomplicated: Secondary | ICD-10-CM | POA: Diagnosis not present

## 2017-05-08 DIAGNOSIS — C50412 Malignant neoplasm of upper-outer quadrant of left female breast: Secondary | ICD-10-CM | POA: Diagnosis not present

## 2017-05-08 DIAGNOSIS — K219 Gastro-esophageal reflux disease without esophagitis: Secondary | ICD-10-CM | POA: Diagnosis not present

## 2017-05-08 DIAGNOSIS — Z170 Estrogen receptor positive status [ER+]: Secondary | ICD-10-CM | POA: Diagnosis not present

## 2017-05-08 DIAGNOSIS — E785 Hyperlipidemia, unspecified: Secondary | ICD-10-CM | POA: Diagnosis not present

## 2017-05-11 ENCOUNTER — Ambulatory Visit
Admission: RE | Admit: 2017-05-11 | Discharge: 2017-05-11 | Disposition: A | Discharge status: Home / Self Care | Payer: Medicare Other | Source: Ambulatory Visit | Attending: Radiation Oncology | Admitting: Radiation Oncology

## 2017-05-11 DIAGNOSIS — C50412 Malignant neoplasm of upper-outer quadrant of left female breast: Secondary | ICD-10-CM | POA: Diagnosis not present

## 2017-05-11 DIAGNOSIS — K219 Gastro-esophageal reflux disease without esophagitis: Secondary | ICD-10-CM | POA: Diagnosis not present

## 2017-05-11 DIAGNOSIS — Z170 Estrogen receptor positive status [ER+]: Secondary | ICD-10-CM | POA: Diagnosis not present

## 2017-05-11 DIAGNOSIS — Z510 Encounter for antineoplastic radiation therapy: Secondary | ICD-10-CM | POA: Diagnosis not present

## 2017-05-11 DIAGNOSIS — E785 Hyperlipidemia, unspecified: Secondary | ICD-10-CM | POA: Diagnosis not present

## 2017-05-11 DIAGNOSIS — J45909 Unspecified asthma, uncomplicated: Secondary | ICD-10-CM | POA: Diagnosis not present

## 2017-05-12 ENCOUNTER — Ambulatory Visit
Admission: RE | Admit: 2017-05-12 | Discharge: 2017-05-12 | Disposition: A | Discharge status: Home / Self Care | Payer: Medicare Other | Source: Ambulatory Visit | Attending: Radiation Oncology | Admitting: Radiation Oncology

## 2017-05-12 ENCOUNTER — Ambulatory Visit (HOSPITAL_BASED_OUTPATIENT_CLINIC_OR_DEPARTMENT_OTHER): Payer: Medicare Other | Admitting: Oncology

## 2017-05-12 VITALS — BP 156/60 | HR 76 | Temp 98.1°F | Resp 18 | Ht 63.0 in | Wt 178.5 lb

## 2017-05-12 DIAGNOSIS — Z170 Estrogen receptor positive status [ER+]: Secondary | ICD-10-CM

## 2017-05-12 DIAGNOSIS — J3089 Other allergic rhinitis: Secondary | ICD-10-CM | POA: Diagnosis not present

## 2017-05-12 DIAGNOSIS — C50412 Malignant neoplasm of upper-outer quadrant of left female breast: Secondary | ICD-10-CM

## 2017-05-12 DIAGNOSIS — J3081 Allergic rhinitis due to animal (cat) (dog) hair and dander: Secondary | ICD-10-CM | POA: Diagnosis not present

## 2017-05-12 DIAGNOSIS — E785 Hyperlipidemia, unspecified: Secondary | ICD-10-CM | POA: Diagnosis not present

## 2017-05-12 DIAGNOSIS — K219 Gastro-esophageal reflux disease without esophagitis: Secondary | ICD-10-CM | POA: Diagnosis not present

## 2017-05-12 DIAGNOSIS — Z510 Encounter for antineoplastic radiation therapy: Secondary | ICD-10-CM | POA: Diagnosis not present

## 2017-05-12 DIAGNOSIS — J45909 Unspecified asthma, uncomplicated: Secondary | ICD-10-CM | POA: Diagnosis not present

## 2017-05-12 MED ORDER — ANASTROZOLE 1 MG PO TABS: 1.0000 mg | ORAL_TABLET | Freq: Every day | ORAL | 4 refills | Status: DC

## 2017-05-12 NOTE — Progress Notes (Signed)
[place]  [phone] [phone]     ID: Erin Novak DOB: Apr 28, 1941  MR#: [id number]  HYQ#:[id number]  Patient Care Team: [name], MD as PCP - General (Internal Medicine) Fanny Skates, MD as Consulting Physician (General Surgery) Sula Fetterly, Virgie Dad, MD as Consulting Physician (Oncology) Kyung Rudd, MD as Consulting Physician (Radiation Oncology) Everlene Farrier, MD as Consulting Physician (Obstetrics and Gynecology) Harold Hedge, Darrick Grinder, MD as Consulting Physician (Allergy and Immunology) Chauncey Cruel, MD OTHER MD:  CHIEF COMPLAINT: Estrogen receptor positive lobular breast cancer  CURRENT TREATMENT: Adjuvant radiation   BREAST CANCER HISTORY: From the original intake note:  The patient had routine screening mammography with tomography at the [facility] 01/12/2017 showing an area of concern in the left breast. On 01/20/2017 she underwent left diagnostic mammography with tomography and left breast ultrasonography. The breast density was category B. In the upper outer quadrant of the left breast there was a partially obscured rounded mass which was not palpable by exam. Ultrasonography it was located at the 11:00 radiant 3 cm from the nipple and measured 1.2 cm.  Biopsy of this mass 01/20/2017 showed (S [id number] [phone]) invasive lobular carcinoma, E-cadherin negative, grade 2, estrogen receptor 90% positive, progesterone receptor 80% positive, both with strong staining intensity, with an MIB-1 of 15%, and no HER-2 amplification, the signals ratio being 1.20 and the number per cell 1.80.  Her subsequent history is as detailed below  INTERVAL HISTORY: Erin Novak returns today for follow-up of her estrogen receptor positive breast cancer, which is lobular and subtype. After her last visit here she had biopsy of left breast upper inner quadrant suspicious area, on 02/09/2017, and this was benign ([id number]).  She then proceeded to left  lumpectomy with sentinel lymph node sampling on 01/31/2017. The pathology from this procedure ([id number]) confirmed invasive lobular carcinoma, grade 1, measuring 1.9 cm. All 5 sentinel lymph nodes were clear. Margins were clear.  She then proceeded to adjuvant radiation, which is ongoing. This is scheduled to be completed later this week.  She is here today to discuss anti-estrogens.   REVIEW OF SYSTEMS: She did well with the surgery, without unusual pain, fever, or bleeding. She is doing well with the radiation aside from mild fatigue. She does have a slight skin rash which she is treating with cortisone cream. A detailed review of systems today was otherwise stable.  PAST MEDICAL HISTORY: Past Medical History:  Diagnosis Date  . Abdominal pain   . Allergy   . Asthma   . Bronchitis   . Cholelithiasis 06-20-2011   [place]   . Diarrhea   . GERD (gastroesophageal reflux disease)   . Hyperlipidemia   . Hypertension   . Hypothyroidism   . Migraines   . PONV (postoperative nausea and vomiting)   . Thyroid disease    hypothyroid  . Ulcer     PAST SURGICAL HISTORY: Past Surgical History:  Procedure Laterality Date  . ABDOMINAL HYSTERECTOMY    . BREAST LUMPECTOMY WITH RADIOACTIVE SEED AND SENTINEL LYMPH NODE BIOPSY Left 03/03/2017   Procedure: LEFT BREAST LUMPECTOMY WITH RADIOACTIVE SEED X2 AND SENTINEL LYMPH NODE BIOPSY;  Surgeon: Fanny Skates, MD;  Location: [facility];  Service: General;  Laterality: Left;  . CHOLECYSTECTOMY  12/25/2011   Procedure: LAPAROSCOPIC CHOLECYSTECTOMY WITH INTRAOPERATIVE CHOLANGIOGRAM;  Surgeon: Earnstine Regal, MD;  Location: [facility];  Service: General;  Laterality: N/A;  laparoscopic cholecystectomy with intraoperative cholangiogram  . COLONOSCOPY  01/10/2003  internal hemorrhoids (Dr. Lajoyce Corners)  . FOOT SURGERY     right  . hysterectomy    . ruptured disk     in neck  . TONSILLECTOMY    . UPPER GASTROINTESTINAL ENDOSCOPY  01/10/2003   hiatal  hernia (Dr. Lajoyce Corners)    FAMILY HISTORY Family History  Problem Relation Age of Onset  . Asthma Father   . Colon cancer Neg Hx   . Stomach cancer Neg Hx   . Esophageal cancer Neg Hx   . Rectal cancer Neg Hx   The patient's father died in his 58s from "old age". The mother died in her 61s with Alzheimer's disease. The patient had one brother, 2 sisters. There is no history of breast or ovarian cancer in the family to the patient's knowledge.  GYNECOLOGIC HISTORY:  No LMP recorded. Patient has had a hysterectomy.  Menarche age 26, first live birth age 82, the patient is [place] P2. She underwent hysterectomy in 1993. (She does not know whether the ovaries were removed are not) She did not take hormone replacement.   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 She is currently widowed and lives alone with 2 indoor N1 outdoor cats. Daughter Annamarie Dawley lives near the patient in [place] and works at the [employer]. Daughter Roland Rack also lives in [place] and works as a Special educational needs teacher. The patient has 2 grandchildren. She is a Tourist information centre manager.     ADVANCED DIRECTIVES: Not in place   HEALTH MAINTENANCE: Social History  Substance Use Topics  . Smoking status: Never Smoker  . Smokeless tobacco: Never Used  . Alcohol use Yes     Comment: rare     Colonoscopy: October 2014/[facility]  PAP:  Bone density: January 2016   Allergies  Allergen Reactions  . Amoxicillin-Pot Clavulanate     REACTION: rash  . Amoxicillin-Pot Clavulanate     Rash  . Lisinopril Other (See Comments)    cough  . Percodan [Oxycodone-Aspirin] Nausea And Vomiting and Other (See Comments)    Sweating, unable to sleep  . Prednisone Other (See Comments)    Makes pt sweat, unable to sleep    Current Outpatient Prescriptions  Medication Sig Dispense Refill  . ALBUTEROL SULFATE HFA IN Inhale 2 puffs into the lungs every 4 (four) hours as needed.    .  budesonide-formoterol (SYMBICORT) 160-4.5 MCG/ACT inhaler Inhale 2 puffs into the lungs 2 (two) times daily as needed. For shortness of breath    . Cholecalciferol (VITAMIN D-3) 5000 UNITS TABS Take 5,000 Units by mouth daily.     Marland Kitchen diltiazem (TIAZAC) 240 MG 24 hr capsule Take 240 mg by mouth every morning.     . hyaluronate sodium (RADIAPLEXRX) GEL Apply 1 application topically 2 (two) times daily.    Marland Kitchen ibuprofen (ADVIL,MOTRIN) 200 MG tablet Take 400 mg by mouth every 6 (six) hours as needed. For pain     . levothyroxine (SYNTHROID, LEVOTHROID) 88 MCG tablet Take 88 mcg by mouth every morning.     . Multiple Vitamins-Minerals (HAIR/SKIN/NAILS PO) Take 1 tablet by mouth daily.    . NON FORMULARY Inject 1 Syringe into the muscle once a week. Allergy injections weekly    . non-metallic deodorant (ALRA) MISC Apply 1 application topically daily.    . Omega-3 Fatty Acids (FISH OIL) 1200 MG CAPS Take 1,200 mg by mouth daily.     . traMADol (ULTRAM) 50 MG tablet Take 1-2 tablets (50-100 mg  total) by mouth every 6 (six) hours as needed for moderate pain. (Patient not taking: Reported on 04/07/2017) 30 tablet 0   No current facility-administered medications for this visit.     OBJECTIVE: Older white woman Who appears stated age  57:   05/12/17 1510  BP: (!) 156/60  Pulse: 76  Resp: 18  Temp: 98.1 F (36.7 C)     Body mass index is 31.62 kg/m.    ECOG FS:1 - Symptomatic but completely ambulatory  Sclerae unicteric, pupils round and equal Oropharynx clear and moist No cervical or supraclavicular adenopathy Lungs no rales or rhonchi Heart regular rate and rhythm Abd soft, nontender, positive bowel sounds MSK no focal spinal tenderness, no upper extremity lymphedema Neuro: nonfocal, well oriented, appropriate affect Breasts: The right breast is benign. The left breast is status post lumpectomy and is currently receiving radiation. There is erythema but no desquamation. Both axillae are  benign.   LAB RESULTS:  CMP     Component Value Date/Time   NA 140 02/25/2017 1204   NA 143 01/28/2017 1216   K 4.3 02/25/2017 1204   K 4.3 01/28/2017 1216   CL 104 02/25/2017 1204   CO2 27 02/25/2017 1204   CO2 28 01/28/2017 1216   GLUCOSE 101 (H) 02/25/2017 1204   GLUCOSE 98 01/28/2017 1216   BUN 10 02/25/2017 1204   BUN 9.3 01/28/2017 1216   CREATININE 0.70 02/25/2017 1204   CREATININE 0.8 01/28/2017 1216   CALCIUM 9.3 02/25/2017 1204   CALCIUM 9.9 01/28/2017 1216   PROT 7.8 01/28/2017 1216   ALBUMIN 4.1 01/28/2017 1216   AST 17 01/28/2017 1216   ALT 14 01/28/2017 1216   ALKPHOS 80 01/28/2017 1216   BILITOT 0.51 01/28/2017 1216   GFRNONAA >60 02/25/2017 1204   GFRAA >60 02/25/2017 1204    INo results found for: SPEP, UPEP  Lab Results  Component Value Date   WBC 7.4 01/28/2017   NEUTROABS 3.9 01/28/2017   HGB 15.0 01/28/2017   HCT 45.1 01/28/2017   MCV 90.6 01/28/2017   PLT 202 01/28/2017      Chemistry      Component Value Date/Time   NA 140 02/25/2017 1204   NA 143 01/28/2017 1216   K 4.3 02/25/2017 1204   K 4.3 01/28/2017 1216   CL 104 02/25/2017 1204   CO2 27 02/25/2017 1204   CO2 28 01/28/2017 1216   BUN 10 02/25/2017 1204   BUN 9.3 01/28/2017 1216   CREATININE 0.70 02/25/2017 1204   CREATININE 0.8 01/28/2017 1216      Component Value Date/Time   CALCIUM 9.3 02/25/2017 1204   CALCIUM 9.9 01/28/2017 1216   ALKPHOS 80 01/28/2017 1216   AST 17 01/28/2017 1216   ALT 14 01/28/2017 1216   BILITOT 0.51 01/28/2017 1216       No results found for: LABCA2  No components found for: LABCA125  No results for input(s): INR in the last 168 hours.  Urinalysis    Component Value Date/Time   COLORURINE YELLOW 12/23/2011 1436   APPEARANCEUR CLOUDY (A) 12/23/2011 1436   LABSPEC 1.017 12/23/2011 1436   PHURINE 7.5 12/23/2011 1436   GLUCOSEU NEGATIVE 12/23/2011 1436   HGBUR NEGATIVE 12/23/2011 1436   BILIRUBINUR NEGATIVE 12/23/2011 1436    KETONESUR NEGATIVE 12/23/2011 1436   PROTEINUR NEGATIVE 12/23/2011 1436   UROBILINOGEN 0.2 12/23/2011 1436   NITRITE NEGATIVE 12/23/2011 1436   LEUKOCYTESUR NEGATIVE 12/23/2011 1436     STUDIES: No results found.  ELIGIBLE FOR AVAILABLE RESEARCH PROTOCOL: no  ASSESSMENT: 76 y.o. [place] woman status post left breast upper outer quadrant biopsy 01/20/2017 for a clinical T1c N0, stage IA invasive lobular breast cancer, E-cadherin negative, estrogen and progesterone receptor strongly positive, with an MIB-1 of 15% and no HER-2 amplification  (a) left breast upper inner quadrant biopsy 02/09/2017 was benign.  (1) breast conserving surgery with sentinel lymph node sampling planned  (2) status post left lumpectomy 03/03/2017 for a pT1c pN0, stage IA invasive lobular carcinoma, with negative margins.  (3) adjuvant radiation to be completed 05/15/2017  (4) to start anastrozole mid July 2018  PLAN: Erin Novak will be completing local treatment of her breast cancer this week. She will be ready to start anti-estrogens as soon as she recovers.. Accordingly today  we discussed the difference between tamoxifen and anastrozole in detail. She understands that anastrozole and the aromatase inhibitors in general work by blocking estrogen production. Accordingly vaginal dryness, decrease in bone density, and of course hot flashes can result. The aromatase inhibitors can also negatively affect the cholesterol profile, although that is a minor effect. One out of 5 women on aromatase inhibitors we will feel "old and achy". This arthralgia/myalgia syndrome, which resembles fibromyalgia clinically, does resolve with stopping the medications. Accordingly this is not a reason to not try an aromatase inhibitor but it is a frequent reason to stop it (in other words 20% of women will not be able to tolerate these medications).  Tamoxifen on the other hand does not block estrogen production. It does not "take away a  woman's estrogen". It blocks the estrogen receptor in breast cells. Like anastrozole, it can also cause hot flashes. As opposed to anastrozole, tamoxifen has many estrogen-like effects. It is technically an estrogen receptor modulator. This means that in some tissues tamoxifen works like estrogen-- for example it helps strengthen the bones. It tends to improve the cholesterol profile. It can cause thickening of the endometrial lining, and even endometrial polyps or rarely cancer of the uterus.(The risk of uterine cancer due to tamoxifen is one additional cancer per thousand women year). It can cause vaginal wetness or stickiness. It can cause blood clots through this estrogen-like effect--the risk of blood clots with tamoxifen is exactly the same as with birth control pills or hormone replacement.  Neither of these agents causes mood changes or weight gain, despite the popular belief that they can have these side effects. We have data from studies comparing either of these drugs with placebo, and in those cases the control group had the same amount of weight gain and depression as the group that took the drug.  After much discussion she is going to start anastrozole around middle of July. She will see me in 3 months or so. If she tolerates anastrozole well we will optimize osteoporosis prevention. Otherwise we will switch to tamoxifen.  She has a good understanding of this plan. She agrees with it. She knows to call for any problems that may develop before her next visit here.  Chauncey Cruel, MD   05/12/2017 3:18 PM Medical Oncology and Hematology [facility] [address] [place], [place] [address] Tel. [phone]    Fax. [phone]

## 2017-05-13 ENCOUNTER — Ambulatory Visit
Admission: RE | Admit: 2017-05-13 | Discharge: 2017-05-13 | Disposition: A | Discharge status: Home / Self Care | Payer: Medicare Other | Source: Ambulatory Visit | Attending: Radiation Oncology | Admitting: Radiation Oncology

## 2017-05-13 DIAGNOSIS — Z510 Encounter for antineoplastic radiation therapy: Secondary | ICD-10-CM | POA: Diagnosis not present

## 2017-05-13 DIAGNOSIS — K219 Gastro-esophageal reflux disease without esophagitis: Secondary | ICD-10-CM | POA: Diagnosis not present

## 2017-05-13 DIAGNOSIS — C50412 Malignant neoplasm of upper-outer quadrant of left female breast: Secondary | ICD-10-CM | POA: Diagnosis not present

## 2017-05-13 DIAGNOSIS — J45909 Unspecified asthma, uncomplicated: Secondary | ICD-10-CM | POA: Diagnosis not present

## 2017-05-13 DIAGNOSIS — Z170 Estrogen receptor positive status [ER+]: Secondary | ICD-10-CM | POA: Diagnosis not present

## 2017-05-13 DIAGNOSIS — E785 Hyperlipidemia, unspecified: Secondary | ICD-10-CM | POA: Diagnosis not present

## 2017-05-14 ENCOUNTER — Encounter: Payer: Self-pay | Admitting: *Deleted

## 2017-05-14 ENCOUNTER — Ambulatory Visit
Admission: RE | Admit: 2017-05-14 | Discharge: 2017-05-14 | Disposition: A | Discharge status: Home / Self Care | Payer: Medicare Other | Source: Ambulatory Visit | Attending: Radiation Oncology | Admitting: Radiation Oncology

## 2017-05-14 DIAGNOSIS — K219 Gastro-esophageal reflux disease without esophagitis: Secondary | ICD-10-CM | POA: Diagnosis not present

## 2017-05-14 DIAGNOSIS — Z170 Estrogen receptor positive status [ER+]: Secondary | ICD-10-CM | POA: Diagnosis not present

## 2017-05-14 DIAGNOSIS — C50412 Malignant neoplasm of upper-outer quadrant of left female breast: Secondary | ICD-10-CM | POA: Diagnosis not present

## 2017-05-14 DIAGNOSIS — E785 Hyperlipidemia, unspecified: Secondary | ICD-10-CM | POA: Diagnosis not present

## 2017-05-14 DIAGNOSIS — Z510 Encounter for antineoplastic radiation therapy: Secondary | ICD-10-CM | POA: Diagnosis not present

## 2017-05-14 DIAGNOSIS — J45909 Unspecified asthma, uncomplicated: Secondary | ICD-10-CM | POA: Diagnosis not present

## 2017-05-15 ENCOUNTER — Ambulatory Visit
Admission: RE | Admit: 2017-05-15 | Discharge: 2017-05-15 | Disposition: A | Discharge status: Home / Self Care | Payer: Medicare Other | Source: Ambulatory Visit | Attending: Radiation Oncology | Admitting: Radiation Oncology

## 2017-05-15 DIAGNOSIS — J45909 Unspecified asthma, uncomplicated: Secondary | ICD-10-CM | POA: Diagnosis not present

## 2017-05-15 DIAGNOSIS — Z510 Encounter for antineoplastic radiation therapy: Secondary | ICD-10-CM | POA: Diagnosis not present

## 2017-05-15 DIAGNOSIS — K219 Gastro-esophageal reflux disease without esophagitis: Secondary | ICD-10-CM | POA: Diagnosis not present

## 2017-05-15 DIAGNOSIS — C50412 Malignant neoplasm of upper-outer quadrant of left female breast: Secondary | ICD-10-CM | POA: Diagnosis not present

## 2017-05-15 DIAGNOSIS — E785 Hyperlipidemia, unspecified: Secondary | ICD-10-CM | POA: Diagnosis not present

## 2017-05-15 DIAGNOSIS — Z170 Estrogen receptor positive status [ER+]: Secondary | ICD-10-CM | POA: Diagnosis not present

## 2017-05-18 ENCOUNTER — Ambulatory Visit: Payer: Medicare Other

## 2017-05-18 ENCOUNTER — Telehealth: Payer: Self-pay | Admitting: Oncology

## 2017-05-18 NOTE — Telephone Encounter (Signed)
sw pt to confirm SCP appt in Aug per sch msg

## 2017-05-19 ENCOUNTER — Ambulatory Visit: Payer: Medicare Other

## 2017-05-21 ENCOUNTER — Ambulatory Visit: Payer: Medicare Other

## 2017-05-22 ENCOUNTER — Ambulatory Visit: Payer: Medicare Other

## 2017-05-25 ENCOUNTER — Ambulatory Visit: Payer: Medicare Other

## 2017-05-26 ENCOUNTER — Ambulatory Visit: Payer: Medicare Other

## 2017-05-27 ENCOUNTER — Ambulatory Visit
Admission: RE | Admit: 2017-05-27 | Discharge: 2017-05-27 | Disposition: A | Discharge status: Home / Self Care | Payer: Medicare Other | Source: Ambulatory Visit | Attending: Radiation Oncology | Admitting: Radiation Oncology

## 2017-05-27 DIAGNOSIS — J3089 Other allergic rhinitis: Secondary | ICD-10-CM | POA: Diagnosis not present

## 2017-05-27 DIAGNOSIS — J3081 Allergic rhinitis due to animal (cat) (dog) hair and dander: Secondary | ICD-10-CM | POA: Diagnosis not present

## 2017-05-28 ENCOUNTER — Ambulatory Visit: Payer: Medicare Other

## 2017-05-29 ENCOUNTER — Ambulatory Visit: Payer: Medicare Other

## 2017-05-29 ENCOUNTER — Encounter: Payer: Self-pay | Admitting: Radiation Oncology

## 2017-05-29 NOTE — Progress Notes (Signed)
Progress Notes Unsigned  Date of Service: 05/27/2017 10:49 AM Carlis Abbott    [] Hide copied text  Radiation Oncology         [phone] ________________________________  Name: BERENICE OEHLERT     MRN: [id number]        Date: 05/27/2017                      DOB: 29-Oct-1941  End of Treatment Note  Diagnosis:   Stage IA, pT1c,pN0,grade 1 ER/PR positive, invasive lobular carcinoma of the left breast.       ICD-9-CM ICD-10-CM   1. Malignant neoplasm of upper-outer quadrant of left breast in female, estrogen receptor positive ([place]) 174.4 C50.412    V86.0 Z17.0     Indication for treatment:  Curative       Radiation treatment dates:   04/20/2017 to 05/15/2017  Site/dose:    1. The Left breast was treated to 42.5 Gy in 17 fractions at 2.5 Gy per fraction. 2. The Left breast was boosted to 7.5 Gy in 3 fractions at 2.5 Gy per fraction.   Beams/energy:    1. 10X, 6X // 3D 2. 10X, 6X // photon boost  Narrative: The patient tolerated radiation treatment relatively well.   She developed moderate radiation change in the treatment area. Overall, the patient's skin looks excellent for finishing treatment today. Patient reports mild itching, helped by cortisone cream use.   Plan: The patient has completed radiation treatment. The patient will return to radiation oncology clinic for routine followup in one month. I advised them to call or return sooner if they have any questions or concerns related to their recovery or treatment.  ------------------------------------------------  Jodelle Gross, MD, PhD  This document serves as a record of services personally performed by Kyung Rudd, MD. It was created on his behalf by Arlyce Harman, a trained medical scribe. The creation of this record is based on the scribe's personal observations and the provider's statements to them. This document has been checked and approved by the attending provider.

## 2017-06-01 ENCOUNTER — Ambulatory Visit: Payer: Medicare Other

## 2017-06-02 ENCOUNTER — Ambulatory Visit: Payer: Medicare Other

## 2017-06-03 ENCOUNTER — Ambulatory Visit: Payer: Medicare Other

## 2017-06-03 DIAGNOSIS — J3089 Other allergic rhinitis: Secondary | ICD-10-CM | POA: Diagnosis not present

## 2017-06-03 DIAGNOSIS — J3081 Allergic rhinitis due to animal (cat) (dog) hair and dander: Secondary | ICD-10-CM | POA: Diagnosis not present

## 2017-06-04 ENCOUNTER — Ambulatory Visit: Payer: Medicare Other

## 2017-06-05 ENCOUNTER — Ambulatory Visit: Payer: Medicare Other

## 2017-06-08 ENCOUNTER — Ambulatory Visit: Payer: Medicare Other

## 2017-06-09 ENCOUNTER — Ambulatory Visit: Payer: Medicare Other

## 2017-06-10 ENCOUNTER — Ambulatory Visit: Payer: Medicare Other

## 2017-06-11 ENCOUNTER — Ambulatory Visit: Payer: Medicare Other

## 2017-06-11 DIAGNOSIS — J3089 Other allergic rhinitis: Secondary | ICD-10-CM | POA: Diagnosis not present

## 2017-06-11 DIAGNOSIS — J3081 Allergic rhinitis due to animal (cat) (dog) hair and dander: Secondary | ICD-10-CM | POA: Diagnosis not present

## 2017-06-17 DIAGNOSIS — J3089 Other allergic rhinitis: Secondary | ICD-10-CM | POA: Diagnosis not present

## 2017-06-17 DIAGNOSIS — E785 Hyperlipidemia, unspecified: Secondary | ICD-10-CM | POA: Diagnosis not present

## 2017-06-17 DIAGNOSIS — J3081 Allergic rhinitis due to animal (cat) (dog) hair and dander: Secondary | ICD-10-CM | POA: Diagnosis not present

## 2017-06-17 DIAGNOSIS — E039 Hypothyroidism, unspecified: Secondary | ICD-10-CM | POA: Diagnosis not present

## 2017-06-25 DIAGNOSIS — J3081 Allergic rhinitis due to animal (cat) (dog) hair and dander: Secondary | ICD-10-CM | POA: Diagnosis not present

## 2017-06-25 DIAGNOSIS — J3089 Other allergic rhinitis: Secondary | ICD-10-CM | POA: Diagnosis not present

## 2017-06-29 DIAGNOSIS — J3089 Other allergic rhinitis: Secondary | ICD-10-CM | POA: Diagnosis not present

## 2017-06-29 DIAGNOSIS — C50412 Malignant neoplasm of upper-outer quadrant of left female breast: Secondary | ICD-10-CM | POA: Diagnosis not present

## 2017-06-29 DIAGNOSIS — J3081 Allergic rhinitis due to animal (cat) (dog) hair and dander: Secondary | ICD-10-CM | POA: Diagnosis not present

## 2017-06-29 DIAGNOSIS — E039 Hypothyroidism, unspecified: Secondary | ICD-10-CM | POA: Diagnosis not present

## 2017-06-29 DIAGNOSIS — E7800 Pure hypercholesterolemia, unspecified: Secondary | ICD-10-CM | POA: Diagnosis not present

## 2017-06-29 DIAGNOSIS — I10 Essential (primary) hypertension: Secondary | ICD-10-CM | POA: Diagnosis not present

## 2017-07-08 ENCOUNTER — Encounter: Payer: Self-pay | Admitting: Radiation Oncology

## 2017-07-08 ENCOUNTER — Ambulatory Visit
Admission: RE | Admit: 2017-07-08 | Discharge: 2017-07-08 | Disposition: A | Discharge status: Home / Self Care | Payer: Medicare Other | Source: Ambulatory Visit | Attending: Radiation Oncology | Admitting: Radiation Oncology

## 2017-07-08 VITALS — BP 156/76 | HR 72 | Temp 98.1°F | Resp 20 | Ht 63.0 in | Wt 178.6 lb

## 2017-07-08 DIAGNOSIS — Z885 Allergy status to narcotic agent status: Secondary | ICD-10-CM | POA: Insufficient documentation

## 2017-07-08 DIAGNOSIS — Z881 Allergy status to other antibiotic agents status: Secondary | ICD-10-CM | POA: Diagnosis not present

## 2017-07-08 DIAGNOSIS — J3089 Other allergic rhinitis: Secondary | ICD-10-CM | POA: Diagnosis not present

## 2017-07-08 DIAGNOSIS — Z888 Allergy status to other drugs, medicaments and biological substances status: Secondary | ICD-10-CM | POA: Diagnosis not present

## 2017-07-08 DIAGNOSIS — Z510 Encounter for antineoplastic radiation therapy: Secondary | ICD-10-CM | POA: Diagnosis not present

## 2017-07-08 DIAGNOSIS — C50412 Malignant neoplasm of upper-outer quadrant of left female breast: Secondary | ICD-10-CM

## 2017-07-08 DIAGNOSIS — K219 Gastro-esophageal reflux disease without esophagitis: Secondary | ICD-10-CM | POA: Diagnosis not present

## 2017-07-08 DIAGNOSIS — E039 Hypothyroidism, unspecified: Secondary | ICD-10-CM | POA: Diagnosis not present

## 2017-07-08 DIAGNOSIS — I10 Essential (primary) hypertension: Secondary | ICD-10-CM | POA: Diagnosis not present

## 2017-07-08 DIAGNOSIS — Z79899 Other long term (current) drug therapy: Secondary | ICD-10-CM | POA: Diagnosis not present

## 2017-07-08 DIAGNOSIS — Z90710 Acquired absence of both cervix and uterus: Secondary | ICD-10-CM | POA: Insufficient documentation

## 2017-07-08 DIAGNOSIS — J3081 Allergic rhinitis due to animal (cat) (dog) hair and dander: Secondary | ICD-10-CM | POA: Diagnosis not present

## 2017-07-08 DIAGNOSIS — Z170 Estrogen receptor positive status [ER+]: Secondary | ICD-10-CM | POA: Insufficient documentation

## 2017-07-08 DIAGNOSIS — J45909 Unspecified asthma, uncomplicated: Secondary | ICD-10-CM | POA: Insufficient documentation

## 2017-07-08 DIAGNOSIS — E785 Hyperlipidemia, unspecified: Secondary | ICD-10-CM | POA: Diagnosis not present

## 2017-07-08 DIAGNOSIS — Z825 Family history of asthma and other chronic lower respiratory diseases: Secondary | ICD-10-CM | POA: Insufficient documentation

## 2017-07-08 DIAGNOSIS — Z7951 Long term (current) use of inhaled steroids: Secondary | ICD-10-CM | POA: Diagnosis not present

## 2017-07-08 NOTE — Addendum Note (Signed)
Encounter addended by: Malena Edman, RN on: 07/08/2017  2:20 PM<BR>    Actions taken: Charge Capture section accepted

## 2017-07-08 NOTE — Progress Notes (Signed)
Radiation Oncology         [phone] ________________________________  Name: MAXCINE STRONG MRN: [id number]  Date of Service: 07/08/2017  DOB: Oct 24, 1941  Post Treatment Note  CC: [name], MD  Magrinat, Virgie Dad, MD  Diagnosis:  Stage IA, pT1c,pN0,grade 1 ER/PR positive, invasive lobular carcinoma of the left breast.  Interval Since Last Radiation:  8 weeks   04/20/2017 to 05/15/2017: 1. The Left breast was treated to 42.5 Gy in 17 fractions at 2.5 Gy per fraction. 2. The Left breast was boosted to 7.5 Gy in 3 fractions at 2.5 Gy per fraction.   Narrative:  The patient returns today for routine follow-up. During treatment she did very well with radiotherapy and did not have significant desquamation.                            On review of systems, the patient states she's having trouble with hot flashes and night sweats. She also has a hard time falling asleep since her Arimidex began. She denies any skin concerns, but notes some indentation of her breast with wearing underwire bras. She denies any pain with this or redness of the skin. No other complaints are noted.   ALLERGIES:  is allergic to amoxicillin-pot clavulanate; amoxicillin-pot clavulanate; lisinopril; percodan [oxycodone-aspirin]; and prednisone.  Meds: Current Outpatient Prescriptions  Medication Sig Dispense Refill  . ALBUTEROL SULFATE HFA IN Inhale 2 puffs into the lungs every 4 (four) hours as needed.    Marland Kitchen anastrozole (ARIMIDEX) 1 MG tablet Take 1 tablet (1 mg total) by mouth daily. 90 tablet 4  . budesonide-formoterol (SYMBICORT) 160-4.5 MCG/ACT inhaler Inhale 2 puffs into the lungs 2 (two) times daily as needed. For shortness of breath    . Cholecalciferol (VITAMIN D-3) 5000 UNITS TABS Take 5,000 Units by mouth daily.     Marland Kitchen diltiazem (TIAZAC) 240 MG 24 hr capsule Take 240 mg by mouth every morning.     Marland Kitchen ibuprofen (ADVIL,MOTRIN) 200 MG tablet Take 400 mg by mouth every 6 (six) hours as needed. For pain      . levothyroxine (SYNTHROID, LEVOTHROID) 88 MCG tablet Take 88 mcg by mouth every morning.     . mometasone-formoterol (DULERA) 100-5 MCG/ACT AERO Inhale 2 puffs into the lungs daily as needed for wheezing.    . Multiple Vitamins-Minerals (HAIR/SKIN/NAILS PO) Take 1 tablet by mouth daily.    . traMADol (ULTRAM) 50 MG tablet Take 1-2 tablets (50-100 mg total) by mouth every 6 (six) hours as needed for moderate pain. (Patient not taking: Reported on 04/07/2017) 30 tablet 0   No current facility-administered medications for this encounter.     Physical Findings:  height is 5\' 3"  (1.6 m) and weight is 178 lb 9.6 oz (81 kg). Her oral temperature is 98.1 F (36.7 C). Her blood pressure is 156/76 (abnormal) and her pulse is 72. Her respiration is 20 and oxygen saturation is 95%.  Pain Assessment Pain Score: 0-No pain/10 In general this is a well appearing Caucasian female in no acute distress. She's alert and oriented x4 and appropriate throughout the examination. Cardiopulmonary assessment is negative for acute distress and she exhibits normal effort. The left breast was examined and reveals mild edema of the inframammary fold. No desquamation is noted otherwise.    Lab Findings: Lab Results  Component Value Date   WBC 7.4 01/28/2017   HGB 15.0 01/28/2017   HCT 45.1 01/28/2017   MCV  90.6 01/28/2017   PLT 202 01/28/2017     Radiographic Findings: No results found.  Impression/Plan: 1. Stage IA, pT1c,pN0,grade 1 ER/PR positive, invasive lobular carcinoma of the left breast. The patient has been doing well since completion of radiotherapy. We discussed that we would be happy to continue to follow her as needed, but she will also continue to follow up with Dr. Jana Hakim in medical oncology. She was counseled on skin care as well as measures to avoid sun exposure to this area.  2. Survivorship. The patient will follow up next week in survivorship. I've encouraged her to discuss her hot flash  symptoms at that visit, and in the mean time she will try taking her Arimidex at night time.     Carola Rhine, PAC

## 2017-07-14 ENCOUNTER — Telehealth: Payer: Self-pay | Admitting: Adult Health

## 2017-07-14 NOTE — Telephone Encounter (Signed)
Called to review SCP visit with patient but no answer and no VM.

## 2017-07-15 ENCOUNTER — Encounter: Payer: Self-pay | Admitting: Adult Health

## 2017-07-15 ENCOUNTER — Ambulatory Visit (HOSPITAL_BASED_OUTPATIENT_CLINIC_OR_DEPARTMENT_OTHER): Payer: Medicare Other | Admitting: Adult Health

## 2017-07-15 VITALS — BP 152/68 | HR 77 | Temp 97.7°F | Resp 18 | Ht 63.0 in | Wt 179.3 lb

## 2017-07-15 DIAGNOSIS — N951 Menopausal and female climacteric states: Secondary | ICD-10-CM | POA: Diagnosis not present

## 2017-07-15 DIAGNOSIS — J3089 Other allergic rhinitis: Secondary | ICD-10-CM | POA: Diagnosis not present

## 2017-07-15 DIAGNOSIS — C50412 Malignant neoplasm of upper-outer quadrant of left female breast: Secondary | ICD-10-CM

## 2017-07-15 DIAGNOSIS — Z170 Estrogen receptor positive status [ER+]: Secondary | ICD-10-CM

## 2017-07-15 DIAGNOSIS — J3081 Allergic rhinitis due to animal (cat) (dog) hair and dander: Secondary | ICD-10-CM | POA: Diagnosis not present

## 2017-07-15 NOTE — Patient Instructions (Signed)
Gabapentin capsules or tablets What is this medicine? GABAPENTIN (GA ba pen tin) is used to control partial seizures in adults with epilepsy. It is also used to treat certain types of nerve pain. This medicine may be used for other purposes; ask your health care provider or pharmacist if you have questions. COMMON BRAND NAME(S): Active-PAC with Gabapentin, Gabarone, Neurontin What should I tell my health care provider before I take this medicine? They need to know if you have any of these conditions: -kidney disease -suicidal thoughts, plans, or attempt; a previous suicide attempt by you or a family member -an unusual or allergic reaction to gabapentin, other medicines, foods, dyes, or preservatives -pregnant or trying to get pregnant -breast-feeding How should I use this medicine? Take this medicine by mouth with a glass of water. Follow the directions on the prescription label. You can take it with or without food. If it upsets your stomach, take it with food.Take your medicine at regular intervals. Do not take it more often than directed. Do not stop taking except on your doctor's advice. If you are directed to break the 600 or 800 mg tablets in half as part of your dose, the extra half tablet should be used for the next dose. If you have not used the extra half tablet within 28 days, it should be thrown away. A special MedGuide will be given to you by the pharmacist with each prescription and refill. Be sure to read this information carefully each time. Talk to your pediatrician regarding the use of this medicine in children. Special care may be needed. Overdosage: If you think you have taken too much of this medicine contact a poison control center or emergency room at once. NOTE: This medicine is only for you. Do not share this medicine with others. What if I miss a dose? If you miss a dose, take it as soon as you can. If it is almost time for your next dose, take only that dose. Do not  take double or extra doses. What may interact with this medicine? Do not take this medicine with any of the following medications: -other gabapentin products This medicine may also interact with the following medications: -alcohol -antacids -antihistamines for allergy, cough and cold -certain medicines for anxiety or sleep -certain medicines for depression or psychotic disturbances -homatropine; hydrocodone -naproxen -narcotic medicines (opiates) for pain -phenothiazines like chlorpromazine, mesoridazine, prochlorperazine, thioridazine This list may not describe all possible interactions. Give your health care provider a list of all the medicines, herbs, non-prescription drugs, or dietary supplements you use. Also tell them if you smoke, drink alcohol, or use illegal drugs. Some items may interact with your medicine. What should I watch for while using this medicine? Visit your doctor or health care professional for regular checks on your progress. You may want to keep a record at home of how you feel your condition is responding to treatment. You may want to share this information with your doctor or health care professional at each visit. You should contact your doctor or health care professional if your seizures get worse or if you have any new types of seizures. Do not stop taking this medicine or any of your seizure medicines unless instructed by your doctor or health care professional. Stopping your medicine suddenly can increase your seizures or their severity. Wear a medical identification bracelet or chain if you are taking this medicine for seizures, and carry a card that lists all your medications. You may get drowsy, dizzy,   or have blurred vision. Do not drive, use machinery, or do anything that needs mental alertness until you know how this medicine affects you. To reduce dizzy or fainting spells, do not sit or stand up quickly, especially if you are an older patient. Alcohol can  increase drowsiness and dizziness. Avoid alcoholic drinks. Your mouth may get dry. Chewing sugarless gum or sucking hard candy, and drinking plenty of water will help. The use of this medicine may increase the chance of suicidal thoughts or actions. Pay special attention to how you are responding while on this medicine. Any worsening of mood, or thoughts of suicide or dying should be reported to your health care professional right away. Women who become pregnant while using this medicine may enroll in the [place] American Antiepileptic Drug Pregnancy Registry by calling 1-888-233-2334. This registry collects information about the safety of antiepileptic drug use during pregnancy. What side effects may I notice from receiving this medicine? Side effects that you should report to your doctor or health care professional as soon as possible: -allergic reactions like skin rash, itching or hives, swelling of the face, lips, or tongue -worsening of mood, thoughts or actions of suicide or dying Side effects that usually do not require medical attention (report to your doctor or health care professional if they continue or are bothersome): -constipation -difficulty walking or controlling muscle movements -dizziness -nausea -slurred speech -tiredness -tremors -weight gain This list may not describe all possible side effects. Call your doctor for medical advice about side effects. You may report side effects to FDA at 1-800-FDA-1088. Where should I keep my medicine? Keep out of reach of children. This medicine may cause accidental overdose and death if it taken by other adults, children, or pets. Mix any unused medicine with a substance like cat litter or coffee grounds. Then throw the medicine away in a sealed container like a sealed bag or a coffee can with a lid. Do not use the medicine after the expiration date. Store at room temperature between 15 and 30 degrees C (59 and 86 degrees F). NOTE: This  sheet is a summary. It may not cover all possible information. If you have questions about this medicine, talk to your doctor, pharmacist, or health care provider.  2018 Elsevier/Gold Standard (2013-12-30 15:26:50)  

## 2017-07-15 NOTE — Progress Notes (Signed)
CLINIC:  Survivorship   REASON FOR VISIT:  Routine follow-up post-treatment for a recent history of breast cancer.  BRIEF ONCOLOGIC HISTORY:    Malignant neoplasm of upper-outer quadrant of left breast in female, estrogen receptor positive ([place])   01/20/2017 Initial Biopsy    Left breast upper outer quadrant biopsy: ILC, grade 1, ER+(90%), PR+(80%), Ki-67 15%, HER-2 negative (ratio 1.20).  T1c, N0      01/28/2017 Initial Diagnosis    Malignant neoplasm of upper-outer quadrant of left breast in female, estrogen receptor positive ([place])     03/03/2017 Surgery    Left lumpectomy: ILC, grade 1, margins negative, 5 LN negatvie, T1C, N0.      03/04/2017 Oncotype testing    20/13, intermediate risk      04/20/2017 - 05/15/2017 Radiation Therapy    Adjuvant radiation [facility]): 1. The Left breast was treated to 42.5 Gy in 17 fractions at 2.5 Gy per fraction.  2. The Left breast was boosted to 7.5 Gy in 3 fractions at 2.5 Gy per fraction.      05/2017 -  Anti-estrogen oral therapy    Anastrozole daily       INTERVAL HISTORY:  Erin Novak presents to the [facility] today for our initial meeting to review her survivorship care plan detailing her treatment course for breast cancer, as well as monitoring long-term side effects of that treatment, education regarding health maintenance, screening, and overall wellness and health promotion.     Overall, Erin Novak reports feeling moderately well.  She does have issues with the Anastrozole that she is taking.  She has issues with sleep and hot flashes.  She switched from taking it in the morning to at night and she still has these and has increased difficulty sleeping.      REVIEW OF SYSTEMS:  Review of Systems  Constitutional: Negative for appetite change, chills, fatigue, fever and unexpected weight change.  HENT:   Negative for hearing loss and lump/mass.   Eyes: Negative for eye problems and icterus.  Respiratory: Negative for chest  tightness, cough and shortness of breath.   Cardiovascular: Negative for chest pain, leg swelling and palpitations.  Gastrointestinal: Negative for abdominal distention, abdominal pain, constipation, diarrhea, nausea and vomiting.  Endocrine: Positive for hot flashes.  Genitourinary: Negative for difficulty urinating.   Musculoskeletal: Negative for arthralgias.  Skin: Negative for itching and rash.  Neurological: Negative for dizziness, extremity weakness and headaches.  Psychiatric/Behavioral: Negative for depression. The patient is not nervous/anxious.   Breast: Denies any new nodularity, masses, tenderness, nipple changes, or nipple discharge.      ONCOLOGY TREATMENT TEAM:  1. Surgeon:  Dr. Dalbert Batman at [facility] 2. Medical Oncologist: Dr. Jana Hakim  3. Radiation Oncologist: Dr. Lisbeth Renshaw    PAST MEDICAL/SURGICAL HISTORY:  Past Medical History:  Diagnosis Date  . Abdominal pain   . Allergy   . Asthma   . Bronchitis   . Cholelithiasis 06-20-2011   [place]   . Diarrhea   . GERD (gastroesophageal reflux disease)   . Hyperlipidemia   . Hypertension   . Hypothyroidism   . Migraines   . PONV (postoperative nausea and vomiting)   . Thyroid disease    hypothyroid  . Ulcer    Past Surgical History:  Procedure Laterality Date  . ABDOMINAL HYSTERECTOMY    . BREAST LUMPECTOMY WITH RADIOACTIVE SEED AND SENTINEL LYMPH NODE BIOPSY Left 03/03/2017   Procedure: LEFT BREAST LUMPECTOMY WITH RADIOACTIVE SEED X2 AND SENTINEL LYMPH NODE  BIOPSY;  Surgeon: Claud Kelp, MD;  Location: [facility];  Service: General;  Laterality: Left;  . CHOLECYSTECTOMY  12/25/2011   Procedure: LAPAROSCOPIC CHOLECYSTECTOMY WITH INTRAOPERATIVE CHOLANGIOGRAM;  Surgeon: Velora Heckler, MD;  Location: [facility];  Service: General;  Laterality: N/A;  laparoscopic cholecystectomy with intraoperative cholangiogram  . COLONOSCOPY  01/10/2003   internal hemorrhoids (Dr. Virginia Rochester)  . FOOT SURGERY     right  .  hysterectomy    . ruptured disk     in neck  . TONSILLECTOMY    . UPPER GASTROINTESTINAL ENDOSCOPY  01/10/2003   hiatal hernia (Dr. Virginia Rochester)     ALLERGIES:  Allergies  Allergen Reactions  . Amoxicillin-Pot Clavulanate     REACTION: rash  . Amoxicillin-Pot Clavulanate     Rash  . Lisinopril Other (See Comments)    cough  . Percodan [Oxycodone-Aspirin] Nausea And Vomiting and Other (See Comments)    Sweating, unable to sleep  . Prednisone Other (See Comments)    Makes pt sweat, unable to sleep     CURRENT MEDICATIONS:  Outpatient Encounter Prescriptions as of 07/15/2017  Medication Sig  . ALBUTEROL SULFATE HFA IN Inhale 2 puffs into the lungs every 4 (four) hours as needed.  Marland Kitchen anastrozole (ARIMIDEX) 1 MG tablet Take 1 tablet (1 mg total) by mouth daily.  . budesonide-formoterol (SYMBICORT) 160-4.5 MCG/ACT inhaler Inhale 2 puffs into the lungs 2 (two) times daily as needed. For shortness of breath  . Cholecalciferol (VITAMIN D-3) 5000 UNITS TABS Take 5,000 Units by mouth daily.   Marland Kitchen diltiazem (TIAZAC) 240 MG 24 hr capsule Take 240 mg by mouth every morning.   Marland Kitchen ibuprofen (ADVIL,MOTRIN) 200 MG tablet Take 400 mg by mouth every 6 (six) hours as needed. For pain   . levothyroxine (SYNTHROID, LEVOTHROID) 88 MCG tablet Take 88 mcg by mouth every morning.   . mometasone-formoterol (DULERA) 100-5 MCG/ACT AERO Inhale 2 puffs into the lungs daily as needed for wheezing.  . Multiple Vitamins-Minerals (HAIR/SKIN/NAILS PO) Take 1 tablet by mouth daily.  . traMADol (ULTRAM) 50 MG tablet Take 1-2 tablets (50-100 mg total) by mouth every 6 (six) hours as needed for moderate pain. (Patient not taking: Reported on 04/07/2017)   No facility-administered encounter medications on file as of 07/15/2017.      ONCOLOGIC FAMILY HISTORY:  Family History  Problem Relation Age of Onset  . Asthma Father   . Colon cancer Neg Hx   . Stomach cancer Neg Hx   . Esophageal cancer Neg Hx   . Rectal cancer Neg Hx       GENETIC COUNSELING/TESTING: Not indicated at this time  SOCIAL HISTORY:  Erin Novak is widowed and lives alone in [place] with her two cats, [place].  She has 2 children and they live in [place].  Ms. Gundrum is currently retired.  She denies any current or history of tobacco, alcohol, or illicit drug use.     PHYSICAL EXAMINATION:  Vital Signs:   Vitals:   07/15/17 0957  BP: (!) 152/68  Pulse: 77  Resp: 18  Temp: 97.7 F (36.5 C)  SpO2: 98%   Filed Weights   07/15/17 0957  Weight: 179 lb 4.8 oz (81.3 kg)   General: Well-nourished, well-appearing female in no acute distress.  She is unaccompanied today.   HEENT: Head is normocephalic.  Pupils equal and reactive to light. Conjunctivae clear without exudate.  Sclerae anicteric. Oral mucosa is pink, moist.  Oropharynx is pink without  lesions or erythema.  Lymph: No cervical, supraclavicular, or infraclavicular lymphadenopathy noted on palpation.  Cardiovascular: Regular rate and rhythm.Marland Kitchen Respiratory: Clear to auscultation bilaterally. Chest expansion symmetric; breathing non-labored.  GI: Abdomen soft and round; non-tender, non-distended. Bowel sounds normoactive.  GU: Deferred.  Neuro: No focal deficits. Steady gait.  Psych: Mood and affect normal and appropriate for situation.  Extremities: No edema. MSK: No focal spinal tenderness to palpation.  Full range of motion in bilateral upper extremities Skin: Warm and dry.  LABORATORY DATA:  None for this visit.  DIAGNOSTIC IMAGING:  None for this visit.      ASSESSMENT AND PLAN:  Ms.. Andringa is a pleasant 77 y.o. female with Stage IA left breast invasive lobular carcinoma, ER+/PR+/HER2-, diagnosed in 01/2017, treated with lumpectomy, adjuvant radiation therapy, and anti-estrogen therapy with Anastrozole beginning in 05/2017.  She presents to the Survivorship Clinic for our initial meeting and routine follow-up post-completion of treatment for breast cancer.     1. Stage IA left breast cancer:  Ms. Helvie is continuing to recover from definitive treatment for breast cancer. She will follow-up with her medical oncologist, Dr. Jana Hakim in 07/2017 with history and physical exam per surveillance protocol.  She will continue her anti-estrogen therapy with Anastrozole. Thus far, she is tolerating the Anastrozole well, with minimal side effects. She was instructed to make Dr. Jana Hakim or myself aware if she begins to experience any worsening side effects of the medication and I could see her back in clinic to help manage those side effects, as needed.  Today, a comprehensive survivorship care plan and treatment summary was reviewed with the patient today detailing her breast cancer diagnosis, treatment course, potential late/long-term effects of treatment, appropriate follow-up care with recommendations for the future, and patient education resources.  A copy of this summary, along with a letter will be sent to the patient's primary care provider via mail/fax/In Basket message after today's visit.    2. Hot Flashes: I reviewed with her tips to maybe decrease the severity of her hot flashes including: avoiding hot and spicy foods, avoiding caffeine and hot beverages, and avoiding tobacco/ETOH.  I also recommended she dress in layers and highlighted some very helpful information on the [employer] website.  She and I discussed Gabapentin and I reviewed with her that Gabapentin may help her hot flashes.  She wants to take some more time to adjust to her hot flashes.  I gave her information about Gabapentin in her AVS today and she will read about it, think about it, and review with Dr. Jana Hakim at her next appointment with him on 9/17.    3. Bone health:  Given Ms. Riebe's age/history of breast cancer and her current treatment regimen including anti-estrogen therapy with Anastrozole, she is at risk for bone demineralization.  She is scheduled to undergo bone density  testing next week at Dr. Julianne Rice office.  In the meantime, she was encouraged to increase her consumption of foods rich in calcium, as well as increase her weight-bearing activities.  She was given education on specific activities to promote bone health.  4. Cancer screening:  Due to Ms. Rotz's history and her age, she should receive screening for skin cancers, colon cancer, and gynecologic cancers.  The information and recommendations are listed on the patient's comprehensive care plan/treatment summary and were reviewed in detail with the patient.    5. Health maintenance and wellness promotion: Ms. Rosen was encouraged to consume 5-7 servings of fruits and vegetables  per day. We reviewed the "Nutrition Rainbow" handout, as well as the handout "Take Control of Your Health and Reduce Your Cancer Risk" from the [employer].  She was also encouraged to engage in moderate to vigorous exercise for 30 minutes per day most days of the week. We discussed the [employer] fitness program, which is designed for cancer survivors to help them become more physically fit after cancer treatments.  She was instructed to limit her alcohol consumption and continue to abstain from tobacco use.     6. Support services/counseling: It is not uncommon for this period of the patient's cancer care trajectory to be one of many emotions and stressors.  We discussed an opportunity for her to participate in the next session of [facility] ("Finding Your New Normal") support group series designed for patients after they have completed treatment.   Ms. Killilea was encouraged to take advantage of our many other support services programs, support groups, and/or counseling in coping with her new life as a cancer survivor after completing anti-cancer treatment.  She was offered support today through active listening and expressive supportive counseling.  She was given information regarding our available services and encouraged to contact  me with any questions or for help enrolling in any of our support group/programs.    Dispo:   -Return to cancer center for follow up with Dr. Jana Hakim on 08/03/2017 -Mammogram due in 12/2017 -Bone Density next week 07/2017 -She is welcome to return back to the Survivorship Clinic at any time; no additional follow-up needed at this time.  -Consider referral back to survivorship as a long-term survivor for continued surveillance  A total of (30) minutes of face-to-face time was spent with this patient with greater than 50% of that time in counseling and care-coordination.   Gardenia Phlegm, [place] [phone]   Note: PRIMARY CARE PROVIDER [name], [place] [phone]

## 2017-07-21 DIAGNOSIS — J3081 Allergic rhinitis due to animal (cat) (dog) hair and dander: Secondary | ICD-10-CM | POA: Diagnosis not present

## 2017-07-21 DIAGNOSIS — J3089 Other allergic rhinitis: Secondary | ICD-10-CM | POA: Diagnosis not present

## 2017-07-21 DIAGNOSIS — M8580 Other specified disorders of bone density and structure, unspecified site: Secondary | ICD-10-CM | POA: Diagnosis not present

## 2017-07-21 DIAGNOSIS — M859 Disorder of bone density and structure, unspecified: Secondary | ICD-10-CM | POA: Diagnosis not present

## 2017-07-27 ENCOUNTER — Encounter: Payer: Medicare Other | Admitting: Adult Health

## 2017-07-28 DIAGNOSIS — H1045 Other chronic allergic conjunctivitis: Secondary | ICD-10-CM | POA: Diagnosis not present

## 2017-07-28 DIAGNOSIS — J4540 Moderate persistent asthma, uncomplicated: Secondary | ICD-10-CM | POA: Diagnosis not present

## 2017-07-28 DIAGNOSIS — J3081 Allergic rhinitis due to animal (cat) (dog) hair and dander: Secondary | ICD-10-CM | POA: Diagnosis not present

## 2017-07-28 DIAGNOSIS — J3089 Other allergic rhinitis: Secondary | ICD-10-CM | POA: Diagnosis not present

## 2017-07-28 DIAGNOSIS — J301 Allergic rhinitis due to pollen: Secondary | ICD-10-CM | POA: Diagnosis not present

## 2017-08-03 ENCOUNTER — Ambulatory Visit: Payer: Medicare Other | Admitting: Oncology

## 2017-08-03 ENCOUNTER — Telehealth: Payer: Self-pay | Admitting: Oncology

## 2017-08-03 ENCOUNTER — Other Ambulatory Visit: Payer: Medicare Other

## 2017-08-03 ENCOUNTER — Other Ambulatory Visit: Payer: Self-pay
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
## 2017-08-04 ENCOUNTER — Telehealth: Payer: Self-pay | Admitting: Oncology

## 2017-08-04 NOTE — Telephone Encounter (Signed)
Left voicemail for patient regarding her added appt for tomorrow.

## 2017-08-05 ENCOUNTER — Ambulatory Visit (HOSPITAL_BASED_OUTPATIENT_CLINIC_OR_DEPARTMENT_OTHER): Payer: Medicare Other | Admitting: Oncology

## 2017-08-05 VITALS — BP 149/63 | HR 73 | Temp 97.8°F | Resp 19 | Ht 63.0 in | Wt 179.2 lb

## 2017-08-05 DIAGNOSIS — Z170 Estrogen receptor positive status [ER+]: Secondary | ICD-10-CM

## 2017-08-05 DIAGNOSIS — C50412 Malignant neoplasm of upper-outer quadrant of left female breast: Secondary | ICD-10-CM

## 2017-08-05 DIAGNOSIS — J3081 Allergic rhinitis due to animal (cat) (dog) hair and dander: Secondary | ICD-10-CM | POA: Diagnosis not present

## 2017-08-05 DIAGNOSIS — N951 Menopausal and female climacteric states: Secondary | ICD-10-CM

## 2017-08-05 DIAGNOSIS — J3089 Other allergic rhinitis: Secondary | ICD-10-CM | POA: Diagnosis not present
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
## 2017-08-06 ENCOUNTER — Other Ambulatory Visit: Payer: Self-pay | Admitting: Oncology

## 2017-08-06 ENCOUNTER — Telehealth: Payer: Self-pay | Admitting: Oncology

## 2017-08-06 NOTE — Telephone Encounter (Signed)
Scheduled appt per 9/19 los - sent reminder letter in the mail.

## 2017-08-06 NOTE — Progress Notes (Signed)
Bone density obtained through Dr. Julianne Rice office on January 28 when he 18 found osteopenia with the lowest hip score at -1.8

## 2017-08-10 DIAGNOSIS — J3081 Allergic rhinitis due to animal (cat) (dog) hair and dander: Secondary | ICD-10-CM | POA: Diagnosis not present

## 2017-08-10 DIAGNOSIS — J3089 Other allergic rhinitis: Secondary | ICD-10-CM | POA: Diagnosis not present

## 2017-08-14 DIAGNOSIS — J3081 Allergic rhinitis due to animal (cat) (dog) hair and dander: Secondary | ICD-10-CM | POA: Diagnosis not present

## 2017-08-14 DIAGNOSIS — J309 Allergic rhinitis, unspecified: Secondary | ICD-10-CM | POA: Diagnosis not present

## 2017-08-26 DIAGNOSIS — J3081 Allergic rhinitis due to animal (cat) (dog) hair and dander: Secondary | ICD-10-CM | POA: Diagnosis not present

## 2017-08-26 DIAGNOSIS — J3089 Other allergic rhinitis: Secondary | ICD-10-CM | POA: Diagnosis not present

## 2017-08-31 DIAGNOSIS — Z23 Encounter for immunization: Secondary | ICD-10-CM | POA: Diagnosis not present

## 2017-09-04 DIAGNOSIS — J3081 Allergic rhinitis due to animal (cat) (dog) hair and dander: Secondary | ICD-10-CM | POA: Diagnosis not present

## 2017-09-04 DIAGNOSIS — J3089 Other allergic rhinitis: Secondary | ICD-10-CM | POA: Diagnosis not present

## 2017-09-10 DIAGNOSIS — S99922D Unspecified injury of left foot, subsequent encounter: Secondary | ICD-10-CM | POA: Diagnosis not present

## 2017-09-10 DIAGNOSIS — S9032XA Contusion of left foot, initial encounter: Secondary | ICD-10-CM | POA: Diagnosis not present

## 2017-09-10 DIAGNOSIS — J3089 Other allergic rhinitis: Secondary | ICD-10-CM | POA: Diagnosis not present

## 2017-09-10 DIAGNOSIS — M7989 Other specified soft tissue disorders: Secondary | ICD-10-CM | POA: Diagnosis not present

## 2017-09-10 DIAGNOSIS — W19XXXA Unspecified fall, initial encounter: Secondary | ICD-10-CM | POA: Diagnosis not present

## 2017-09-10 DIAGNOSIS — J3081 Allergic rhinitis due to animal (cat) (dog) hair and dander: Secondary | ICD-10-CM | POA: Diagnosis not present

## 2017-09-10 DIAGNOSIS — S99912A Unspecified injury of left ankle, initial encounter: Secondary | ICD-10-CM | POA: Diagnosis not present

## 2017-09-15 DIAGNOSIS — J3089 Other allergic rhinitis: Secondary | ICD-10-CM | POA: Diagnosis not present

## 2017-09-15 DIAGNOSIS — S92215A Nondisplaced fracture of cuboid bone of left foot, initial encounter for closed fracture: Secondary | ICD-10-CM | POA: Diagnosis not present

## 2017-09-15 DIAGNOSIS — J3081 Allergic rhinitis due to animal (cat) (dog) hair and dander: Secondary | ICD-10-CM | POA: Diagnosis not present

## 2017-09-24 DIAGNOSIS — J3081 Allergic rhinitis due to animal (cat) (dog) hair and dander: Secondary | ICD-10-CM | POA: Diagnosis not present

## 2017-09-24 DIAGNOSIS — J3089 Other allergic rhinitis: Secondary | ICD-10-CM | POA: Diagnosis not present

## 2017-09-28 DIAGNOSIS — J3081 Allergic rhinitis due to animal (cat) (dog) hair and dander: Secondary | ICD-10-CM | POA: Diagnosis not present

## 2017-09-28 DIAGNOSIS — J3089 Other allergic rhinitis: Secondary | ICD-10-CM | POA: Diagnosis not present

## 2017-10-05 DIAGNOSIS — J3089 Other allergic rhinitis: Secondary | ICD-10-CM | POA: Diagnosis not present

## 2017-10-05 DIAGNOSIS — J3081 Allergic rhinitis due to animal (cat) (dog) hair and dander: Secondary | ICD-10-CM | POA: Diagnosis not present

## 2017-10-15 DIAGNOSIS — J3089 Other allergic rhinitis: Secondary | ICD-10-CM | POA: Diagnosis not present

## 2017-10-20 DIAGNOSIS — J3089 Other allergic rhinitis: Secondary | ICD-10-CM | POA: Diagnosis not present

## 2017-10-20 DIAGNOSIS — J3081 Allergic rhinitis due to animal (cat) (dog) hair and dander: Secondary | ICD-10-CM | POA: Diagnosis not present

## 2017-11-03 DIAGNOSIS — H52221 Regular astigmatism, right eye: Secondary | ICD-10-CM | POA: Diagnosis not present

## 2017-11-03 DIAGNOSIS — H2513 Age-related nuclear cataract, bilateral: Secondary | ICD-10-CM | POA: Diagnosis not present

## 2017-11-03 DIAGNOSIS — H5212 Myopia, left eye: Secondary | ICD-10-CM | POA: Diagnosis not present

## 2017-11-03 DIAGNOSIS — H1045 Other chronic allergic conjunctivitis: Secondary | ICD-10-CM | POA: Diagnosis not present

## 2017-11-04 DIAGNOSIS — J3089 Other allergic rhinitis: Secondary | ICD-10-CM | POA: Diagnosis not present

## 2017-11-04 DIAGNOSIS — J3081 Allergic rhinitis due to animal (cat) (dog) hair and dander: Secondary | ICD-10-CM | POA: Diagnosis not present

## 2017-11-11 DIAGNOSIS — J3089 Other allergic rhinitis: Secondary | ICD-10-CM | POA: Diagnosis not present

## 2017-11-11 DIAGNOSIS — J3081 Allergic rhinitis due to animal (cat) (dog) hair and dander: Secondary | ICD-10-CM | POA: Diagnosis not present

## 2017-11-12 ENCOUNTER — Telehealth: Payer: Self-pay | Admitting: *Deleted

## 2017-11-12 DIAGNOSIS — N63 Unspecified lump in unspecified breast: Secondary | ICD-10-CM

## 2017-11-12 DIAGNOSIS — Z170 Estrogen receptor positive status [ER+]: Principal | ICD-10-CM

## 2017-11-12 DIAGNOSIS — C50412 Malignant neoplasm of upper-outer quadrant of left female breast: Secondary | ICD-10-CM

## 2017-11-12 NOTE — Telephone Encounter (Signed)
This RN spoke with pt per her call stating concern due to " knot in my left breast and it seems to be getting bigger "  Erin Novak states knot in near lumpectomy scar " but kinda in the middle " - knot is " more long then round ".  She denies any warmth swelling or tenderness " but I can tell there is something there "  Pt is 8 months post surgery with last mammogram in April 2018.  Per discussion- plan is to proceed with mammo with u/s of left breast with appointment to follow for review.  Audrea Muscat stated understanding and appreciation of above.

## 2017-11-19 ENCOUNTER — Other Ambulatory Visit: Payer: Medicare Other

## 2017-11-26 ENCOUNTER — Ambulatory Visit
Admission: RE | Admit: 2017-11-26 | Discharge: 2017-11-26 | Disposition: A | Discharge status: Home / Self Care | Payer: Medicare Other | Source: Ambulatory Visit | Attending: Adult Health | Admitting: Adult Health

## 2017-11-26 ENCOUNTER — Other Ambulatory Visit: Payer: Self-pay | Admitting: Oncology

## 2017-11-26 DIAGNOSIS — C50412 Malignant neoplasm of upper-outer quadrant of left female breast: Secondary | ICD-10-CM

## 2017-11-26 DIAGNOSIS — N63 Unspecified lump in unspecified breast: Secondary | ICD-10-CM

## 2017-11-26 DIAGNOSIS — N6489 Other specified disorders of breast: Secondary | ICD-10-CM | POA: Diagnosis not present

## 2017-11-26 DIAGNOSIS — Z170 Estrogen receptor positive status [ER+]: Principal | ICD-10-CM

## 2017-11-26 DIAGNOSIS — R928 Other abnormal and inconclusive findings on diagnostic imaging of breast: Secondary | ICD-10-CM | POA: Diagnosis not present

## 2017-11-26 DIAGNOSIS — J3081 Allergic rhinitis due to animal (cat) (dog) hair and dander: Secondary | ICD-10-CM | POA: Diagnosis not present

## 2017-11-26 DIAGNOSIS — J3089 Other allergic rhinitis: Secondary | ICD-10-CM | POA: Diagnosis not present

## 2017-11-26 HISTORY — DX: Personal history of irradiation: Z92.3

## 2017-12-04 DIAGNOSIS — J3089 Other allergic rhinitis: Secondary | ICD-10-CM | POA: Diagnosis not present

## 2017-12-04 DIAGNOSIS — J3081 Allergic rhinitis due to animal (cat) (dog) hair and dander: Secondary | ICD-10-CM | POA: Diagnosis not present

## 2017-12-07 DIAGNOSIS — J3081 Allergic rhinitis due to animal (cat) (dog) hair and dander: Secondary | ICD-10-CM | POA: Diagnosis not present

## 2017-12-07 DIAGNOSIS — J3089 Other allergic rhinitis: Secondary | ICD-10-CM | POA: Diagnosis not present

## 2017-12-24 DIAGNOSIS — Z779 Other contact with and (suspected) exposures hazardous to health: Secondary | ICD-10-CM | POA: Diagnosis not present

## 2017-12-24 DIAGNOSIS — W5501XA Bitten by cat, initial encounter: Secondary | ICD-10-CM | POA: Diagnosis not present

## 2017-12-24 DIAGNOSIS — S61459A Open bite of unspecified hand, initial encounter: Secondary | ICD-10-CM | POA: Diagnosis not present

## 2017-12-24 DIAGNOSIS — J3089 Other allergic rhinitis: Secondary | ICD-10-CM | POA: Diagnosis not present

## 2017-12-24 DIAGNOSIS — J3081 Allergic rhinitis due to animal (cat) (dog) hair and dander: Secondary | ICD-10-CM | POA: Diagnosis not present

## 2017-12-28 DIAGNOSIS — E039 Hypothyroidism, unspecified: Secondary | ICD-10-CM | POA: Diagnosis not present

## 2017-12-28 DIAGNOSIS — J3081 Allergic rhinitis due to animal (cat) (dog) hair and dander: Secondary | ICD-10-CM | POA: Diagnosis not present

## 2017-12-28 DIAGNOSIS — J3089 Other allergic rhinitis: Secondary | ICD-10-CM | POA: Diagnosis not present

## 2017-12-28 DIAGNOSIS — N390 Urinary tract infection, site not specified: Secondary | ICD-10-CM | POA: Diagnosis not present

## 2017-12-28 DIAGNOSIS — I10 Essential (primary) hypertension: Secondary | ICD-10-CM | POA: Diagnosis not present

## 2018-01-06 DIAGNOSIS — J3081 Allergic rhinitis due to animal (cat) (dog) hair and dander: Secondary | ICD-10-CM | POA: Diagnosis not present

## 2018-01-06 DIAGNOSIS — J3089 Other allergic rhinitis: Secondary | ICD-10-CM | POA: Diagnosis not present

## 2018-01-07 DIAGNOSIS — Z0000 Encounter for general adult medical examination without abnormal findings: Secondary | ICD-10-CM | POA: Diagnosis not present

## 2018-01-14 DIAGNOSIS — J3089 Other allergic rhinitis: Secondary | ICD-10-CM | POA: Diagnosis not present

## 2018-01-14 DIAGNOSIS — J3081 Allergic rhinitis due to animal (cat) (dog) hair and dander: Secondary | ICD-10-CM | POA: Diagnosis not present

## 2018-01-21 DIAGNOSIS — J3089 Other allergic rhinitis: Secondary | ICD-10-CM | POA: Diagnosis not present

## 2018-01-21 DIAGNOSIS — J3081 Allergic rhinitis due to animal (cat) (dog) hair and dander: Secondary | ICD-10-CM | POA: Diagnosis not present

## 2018-01-27 DIAGNOSIS — J3081 Allergic rhinitis due to animal (cat) (dog) hair and dander: Secondary | ICD-10-CM | POA: Diagnosis not present

## 2018-01-27 DIAGNOSIS — J3089 Other allergic rhinitis: Secondary | ICD-10-CM | POA: Diagnosis not present

## 2018-02-03 DIAGNOSIS — J3089 Other allergic rhinitis: Secondary | ICD-10-CM | POA: Diagnosis not present

## 2018-02-03 DIAGNOSIS — J3081 Allergic rhinitis due to animal (cat) (dog) hair and dander: Secondary | ICD-10-CM | POA: Diagnosis not present

## 2018-02-08 DIAGNOSIS — J3081 Allergic rhinitis due to animal (cat) (dog) hair and dander: Secondary | ICD-10-CM | POA: Diagnosis not present

## 2018-02-08 DIAGNOSIS — J3089 Other allergic rhinitis: Secondary | ICD-10-CM | POA: Diagnosis not present

## 2018-02-18 DIAGNOSIS — J3081 Allergic rhinitis due to animal (cat) (dog) hair and dander: Secondary | ICD-10-CM | POA: Diagnosis not present

## 2018-02-18 DIAGNOSIS — J3089 Other allergic rhinitis: Secondary | ICD-10-CM | POA: Diagnosis not present

## 2018-02-24 ENCOUNTER — Ambulatory Visit
Admission: RE | Admit: 2018-02-24 | Discharge: 2018-02-24 | Disposition: A | Discharge status: Home / Self Care | Payer: Medicare Other | Source: Ambulatory Visit | Attending: Oncology | Admitting: Oncology

## 2018-02-24 DIAGNOSIS — R928 Other abnormal and inconclusive findings on diagnostic imaging of breast: Secondary | ICD-10-CM | POA: Diagnosis not present

## 2018-02-24 DIAGNOSIS — Z170 Estrogen receptor positive status [ER+]: Principal | ICD-10-CM

## 2018-02-24 DIAGNOSIS — J3081 Allergic rhinitis due to animal (cat) (dog) hair and dander: Secondary | ICD-10-CM | POA: Diagnosis not present

## 2018-02-24 DIAGNOSIS — C50412 Malignant neoplasm of upper-outer quadrant of left female breast: Secondary | ICD-10-CM

## 2018-02-24 DIAGNOSIS — J3089 Other allergic rhinitis: Secondary | ICD-10-CM | POA: Diagnosis not present

## 2018-03-01 NOTE — Progress Notes (Signed)
Erin Novak  [phone] [phone]     ID: Erin Novak DOB: [birth year]  MR#: [id number]  LOV#:[id number]  Patient Care Team: [name], MD as PCP - General (Internal Medicine) Fanny Skates, MD as Consulting Physician (General Surgery) Magrinat, Virgie Dad, MD as Consulting Physician (Oncology) Kyung Rudd, MD as Consulting Physician (Radiation Oncology) Everlene Farrier, MD as Consulting Physician (Obstetrics and Gynecology) Harold Hedge, Darrick Grinder, MD as Consulting Physician (Allergy and Immunology) Delice Bison Charlestine Massed, NP as Nurse Practitioner (Hematology and Oncology) OTHER MD:  CHIEF COMPLAINT: Estrogen receptor positive lobular breast cancer  CURRENT TREATMENT: anastrozole   BREAST CANCER HISTORY: From the original intake note:  The patient had routine screening mammography with tomography at the [facility] 01/12/2017 showing an area of concern in the left breast. On 01/20/2017 she underwent left diagnostic mammography with tomography and left breast ultrasonography. The breast density was category B. In the upper outer quadrant of the left breast there was a partially obscured rounded mass which was not palpable by exam. Ultrasonography it was located at the 11:00 radiant 3 cm from the nipple and measured 1.2 cm.  Biopsy of this mass 01/20/2017 showed (S [id number] [phone]) invasive lobular carcinoma, E-cadherin negative, grade 2, estrogen receptor 90% positive, progesterone receptor 80% positive, both with strong staining intensity, with an MIB-1 of 15%, and no HER-2 amplification, the signals ratio being 1.20 and the number per cell 1.80.  Her subsequent history is as detailed below  INTERVAL HISTORY: Erin Novak returns today for follow-up and treatment of her estrogen receptor positive breast cancer. She continues on anastrozole, with fair tolerance. She has hot flashes that are intense at night time. She also has them occasionally during  the day. For now, she says she can tolerate the hot flashes without taking medication for it. She denies issues with vaginal dryness.   Since her last visit to the office, she underwent a diagnostic mammogram at [facility] on 02/24/2018 with results showing: Breast density category B. No evidence of malignancy in either breast.    REVIEW OF SYSTEMS: Erin Novak reports that she has been cleaning her yard and doing house work. She has a stray cat that she is feeding, and she lets the cat lay on an electric blanket at night. She has 2 additional cats. She says that she was raised around plenty of farm animals. She denies unusual headaches, visual changes, nausea, vomiting, or dizziness. There has been no unusual cough, phlegm production, or pleurisy. This been no change in bowel or bladder habits. She denies unexplained fatigue or unexplained weight loss, bleeding, rash, or fever. A detailed review of systems was otherwise stable.  PAST MEDICAL HISTORY: Past Medical History:  Diagnosis Date  . Abdominal pain   . Allergy   . Asthma   . Bronchitis   . Cholelithiasis 06-20-2011   [place]   . Diarrhea   . GERD (gastroesophageal reflux disease)   . Hyperlipidemia   . Hypertension   . Hypothyroidism   . Migraines   . Personal history of radiation therapy 2018  . PONV (postoperative nausea and vomiting)   . Thyroid disease    hypothyroid  . Ulcer     PAST SURGICAL HISTORY: Past Surgical History:  Procedure Laterality Date  . ABDOMINAL HYSTERECTOMY    . BREAST LUMPECTOMY Left 03/03/2017  . BREAST LUMPECTOMY WITH RADIOACTIVE SEED AND SENTINEL LYMPH NODE BIOPSY Left 03/03/2017   Procedure: LEFT BREAST LUMPECTOMY WITH RADIOACTIVE SEED X2 AND SENTINEL LYMPH  NODE BIOPSY;  Surgeon: Fanny Skates, MD;  Location: [facility];  Service: General;  Laterality: Left;  . CHOLECYSTECTOMY  12/25/2011   Procedure: LAPAROSCOPIC CHOLECYSTECTOMY WITH INTRAOPERATIVE CHOLANGIOGRAM;  Surgeon: Earnstine Regal, MD;  Location: [facility];  Service: General;  Laterality: N/A;  laparoscopic cholecystectomy with intraoperative cholangiogram  . COLONOSCOPY  01/10/2003   internal hemorrhoids (Dr. Lajoyce Corners)  . FOOT SURGERY     right  . hysterectomy    . ruptured disk     in neck  . TONSILLECTOMY    . UPPER GASTROINTESTINAL ENDOSCOPY  01/10/2003   hiatal hernia (Dr. Lajoyce Corners)    FAMILY HISTORY Family History  Problem Relation Age of Onset  . Asthma Father   . Colon cancer Neg Hx   . Stomach cancer Neg Hx   . Esophageal cancer Neg Hx   . Rectal cancer Neg Hx   The patient's father died in his 41s from "old age". The mother died in her 84s with Alzheimer's disease. The patient had one brother, 2 sisters. There is no history of breast or ovarian cancer in the family to the patient's knowledge.  GYNECOLOGIC HISTORY:  No LMP recorded. Patient has had a hysterectomy.  Menarche age 55, first live birth age 56, the patient is [place] P2. She underwent hysterectomy in 1993. (She does not know whether the ovaries were removed are not) She did not take hormone replacement.   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 She is currently widowed and lives alone with 2 indoor N1 outdoor cats. Daughter Erin Novak lives near the patient in [place] and works at the [employer]. Daughter Erin Novak also lives in [place] and works as a Special educational needs teacher. The patient has 2 grandchildren. She is a Tourist information centre manager.     ADVANCED DIRECTIVES: Not in place   HEALTH MAINTENANCE: Social History   Tobacco Use  . Smoking status: Never Smoker  . Smokeless tobacco: Never Used  Substance Use Topics  . Alcohol use: Yes    Comment: rare  . Drug use: No     Colonoscopy: October 2014/[facility]  PAP:  Bone density: January 2016   Allergies  Allergen Reactions  . Amoxicillin-Pot Clavulanate     REACTION: rash  . Amoxicillin-Pot Clavulanate     Rash  . Lisinopril Other (See  Comments)    cough  . Percodan [Oxycodone-Aspirin] Nausea And Vomiting and Other (See Comments)    Sweating, unable to sleep  . Prednisone Other (See Comments)    Makes pt sweat, unable to sleep    Current Outpatient Medications  Medication Sig Dispense Refill  . ALBUTEROL SULFATE HFA IN Inhale 2 puffs into the lungs every 4 (four) hours as needed.    Marland Kitchen anastrozole (ARIMIDEX) 1 MG tablet Take 1 tablet (1 mg total) by mouth daily. 90 tablet 4  . budesonide-formoterol (SYMBICORT) 160-4.5 MCG/ACT inhaler Inhale 2 puffs into the lungs 2 (two) times daily as needed. For shortness of breath    . Cholecalciferol (VITAMIN D-3) 5000 UNITS TABS Take 5,000 Units by mouth daily.     Marland Kitchen diltiazem (TIAZAC) 240 MG 24 hr capsule Take 240 mg by mouth every morning.     Marland Kitchen ibuprofen (ADVIL,MOTRIN) 200 MG tablet Take 400 mg by mouth every 6 (six) hours as needed. For pain     . levothyroxine (SYNTHROID, LEVOTHROID) 88 MCG tablet Take 88 mcg by mouth every morning.     . mometasone-formoterol (DULERA)  100-5 MCG/ACT AERO Inhale 2 puffs into the lungs daily as needed for wheezing.    . Multiple Vitamins-Minerals (HAIR/SKIN/NAILS PO) Take 1 tablet by mouth daily.    . traMADol (ULTRAM) 50 MG tablet Take 1-2 tablets (50-100 mg total) by mouth every 6 (six) hours as needed for moderate pain. (Patient not taking: Reported on 04/07/2017) 30 tablet 0   No current facility-administered medications for this visit.     OBJECTIVE: Older white woman in no acute distress  Vitals:   03/02/18 1202  BP: 136/84  Pulse: 85  Resp: 18  Temp: 98.1 F (36.7 C)  SpO2: 99%     Body mass index is 30.88 kg/m.    ECOG FS:1 - Symptomatic but completely ambulatory  Sclerae unicteric, pupils round and equal Oropharynx clear and moist No cervical or supraclavicular adenopathy Lungs no rales or rhonchi Heart regular rate and rhythm Abd soft, nontender, positive bowel sounds MSK no focal spinal tenderness, no upper extremity  lymphedema Neuro: nonfocal, well oriented, appropriate affect Breasts: The right breast is benign.  On the left the patient has undergone lumpectomy followed by radiation.  There is no evidence of local recurrence.  Both axillae are benign.  LAB RESULTS:  CMP     Component Value Date/Time   NA 141 03/02/2018 1119   NA 143 01/28/2017 1216   K 3.8 03/02/2018 1119   K 4.3 01/28/2017 1216   CL 109 03/02/2018 1119   CO2 24 03/02/2018 1119   CO2 28 01/28/2017 1216   GLUCOSE 92 03/02/2018 1119   GLUCOSE 98 01/28/2017 1216   BUN 13 03/02/2018 1119   BUN 9.3 01/28/2017 1216   CREATININE 0.78 03/02/2018 1119   CREATININE 0.8 01/28/2017 1216   CALCIUM 9.6 03/02/2018 1119   CALCIUM 9.9 01/28/2017 1216   PROT 7.5 03/02/2018 1119   PROT 7.8 01/28/2017 1216   ALBUMIN 4.1 03/02/2018 1119   ALBUMIN 4.1 01/28/2017 1216   AST 21 03/02/2018 1119   AST 17 01/28/2017 1216   ALT 26 03/02/2018 1119   ALT 14 01/28/2017 1216   ALKPHOS 75 03/02/2018 1119   ALKPHOS 80 01/28/2017 1216   BILITOT 0.9 03/02/2018 1119   BILITOT 0.51 01/28/2017 1216   GFRNONAA >60 03/02/2018 1119   GFRAA >60 03/02/2018 1119    INo results found for: SPEP, UPEP  Lab Results  Component Value Date   WBC 7.0 03/02/2018   NEUTROABS 4.5 03/02/2018   HGB 15.0 03/02/2018   HCT 45.7 03/02/2018   MCV 92.0 03/02/2018   PLT 191 03/02/2018      Chemistry      Component Value Date/Time   NA 141 03/02/2018 1119   NA 143 01/28/2017 1216   K 3.8 03/02/2018 1119   K 4.3 01/28/2017 1216   CL 109 03/02/2018 1119   CO2 24 03/02/2018 1119   CO2 28 01/28/2017 1216   BUN 13 03/02/2018 1119   BUN 9.3 01/28/2017 1216   CREATININE 0.78 03/02/2018 1119   CREATININE 0.8 01/28/2017 1216      Component Value Date/Time   CALCIUM 9.6 03/02/2018 1119   CALCIUM 9.9 01/28/2017 1216   ALKPHOS 75 03/02/2018 1119   ALKPHOS 80 01/28/2017 1216   AST 21 03/02/2018 1119   AST 17 01/28/2017 1216   ALT 26 03/02/2018 1119   ALT 14  01/28/2017 1216   BILITOT 0.9 03/02/2018 1119   BILITOT 0.51 01/28/2017 1216       No results found for: LABCA2  No components found for: LABCA125  No results for input(s): INR in the last 168 hours.  Urinalysis    Component Value Date/Time   COLORURINE YELLOW 12/23/2011 1436   APPEARANCEUR CLOUDY (A) 12/23/2011 1436   LABSPEC 1.017 12/23/2011 1436   PHURINE 7.5 12/23/2011 1436   GLUCOSEU NEGATIVE 12/23/2011 1436   HGBUR NEGATIVE 12/23/2011 1436   BILIRUBINUR NEGATIVE 12/23/2011 1436   KETONESUR NEGATIVE 12/23/2011 1436   PROTEINUR NEGATIVE 12/23/2011 1436   UROBILINOGEN 0.2 12/23/2011 1436   NITRITE NEGATIVE 12/23/2011 1436   LEUKOCYTESUR NEGATIVE 12/23/2011 1436     STUDIES: Mm Diag Breast Tomo Bilateral  Result Date: 02/24/2018 CLINICAL DATA:  History of left breast cancer status post lumpectomy in 2018. EXAM: DIGITAL DIAGNOSTIC BILATERAL MAMMOGRAM WITH CAD AND TOMO COMPARISON:  Previous exam(s). ACR Breast Density Category b: There are scattered areas of fibroglandular density. FINDINGS: Stable lumpectomy changes are seen in the left breast. No suspicious mass, malignant type microcalcifications or distortion detected in either breast. Mammographic images were processed with CAD. IMPRESSION: No evidence of malignancy in either breast. RECOMMENDATION: Bilateral diagnostic mammogram in 1 year is recommended. I have discussed the findings and recommendations with the patient. Results were also provided in writing at the conclusion of the visit. If applicable, a reminder letter will be sent to the patient regarding the next appointment. BI-RADS CATEGORY  2: Benign. Electronically Signed   By: Lillia Mountain M.D.   On: 02/24/2018 11:39     ELIGIBLE FOR AVAILABLE RESEARCH PROTOCOL: no  ASSESSMENT: 77 y.o. [place] woman status post left breast upper outer quadrant biopsy 01/20/2017 for a clinical T1c N0, stage IA invasive lobular breast cancer, E-cadherin negative, estrogen and  progesterone receptor strongly positive, with an MIB-1 of 15% and no HER-2 amplification  (a) left breast upper inner quadrant biopsy 02/09/2017 was benign.  (1) status post left lumpectomy 03/03/2017 for a pT1c pN0, stage IA invasive lobular carcinoma, with negative margins.  (2) adjuvant radiation 04/20/2017 to 05/15/2017 Site/dose: 1. The Left breast was treated to 42.5 Gy in 17 fractions at 2.5 Gy per fraction. 2. The Left breast was boosted to 7.5 Gy in 3 fractions at 2.5 Gy per fraction.   (3) started anastrozole mid July 2018  PLAN: Erin Novak is now a year out from definitive surgery for breast cancer with no evidence of disease recurrence.  This is favorable.  She is tolerating anastrozole generally well except for the nighttime hot flashes.  We discussed gabapentin, which I think would be helpful to her, but she thinks she is already on plenty of medicine and does not want anymore.  She can "live with it" at least for the time being.  Accordingly we are making no changes in her treatment.  We did review her mammogram which shows density category B, which is favorable.  She will return to see me in 1 year.  She knows to call for any issues that may develop before the next visit.  Magrinat, Virgie Dad, MD  03/02/18 12:34 PM Medical Oncology and Hematology [facility] [address] [place], [place] [address] Tel. [phone]    Fax. [phone]    This document serves as a record of services personally performed by Lurline Del, MD. It was created on his behalf by Sheron Nightingale, a trained medical scribe. The creation of this record is based on the scribe's personal observations and the provider's statements to them.   I have reviewed the above documentation for accuracy and completeness,  and I agree with the above.

## 2018-03-02 ENCOUNTER — Inpatient Hospital Stay: Payer: Medicare Other | Attending: Oncology | Admitting: Oncology

## 2018-03-02 ENCOUNTER — Telehealth: Payer: Self-pay | Admitting: Oncology

## 2018-03-02 ENCOUNTER — Inpatient Hospital Stay: Payer: Medicare Other

## 2018-03-02 VITALS — BP 136/84 | HR 85 | Temp 98.1°F | Resp 18 | Ht 63.0 in | Wt 174.3 lb

## 2018-03-02 DIAGNOSIS — N951 Menopausal and female climacteric states: Secondary | ICD-10-CM | POA: Diagnosis not present

## 2018-03-02 DIAGNOSIS — Z170 Estrogen receptor positive status [ER+]: Principal | ICD-10-CM

## 2018-03-02 DIAGNOSIS — C50412 Malignant neoplasm of upper-outer quadrant of left female breast: Secondary | ICD-10-CM

## 2018-03-02 DIAGNOSIS — J301 Allergic rhinitis due to pollen: Secondary | ICD-10-CM | POA: Diagnosis not present

## 2018-03-02 DIAGNOSIS — J3089 Other allergic rhinitis: Secondary | ICD-10-CM | POA: Diagnosis not present

## 2018-03-02 LAB — CBC WITH DIFFERENTIAL/PLATELET
Basophils Absolute: 0.1 10*3/uL (ref 0.0–0.1)
Basophils Relative: 1 %
EOS PCT: 1 %
Eosinophils Absolute: 0.1 10*3/uL (ref 0.0–0.5)
HCT: 45.7 % (ref 34.8–46.6)
Hemoglobin: 15 g/dL (ref 11.6–15.9)
LYMPHS PCT: 24 %
Lymphs Abs: 1.7 10*3/uL (ref 0.9–3.3)
MCH: 30.2 pg (ref 25.1–34.0)
MCHC: 32.8 g/dL (ref 31.5–36.0)
MCV: 92 fL (ref 79.5–101.0)
MONO ABS: 0.8 10*3/uL (ref 0.1–0.9)
MONOS PCT: 11 %
Neutro Abs: 4.5 10*3/uL (ref 1.5–6.5)
Neutrophils Relative %: 63 %
PLATELETS: 191 10*3/uL (ref 145–400)
RBC: 4.97 MIL/uL (ref 3.70–5.45)
RDW: 14.4 % (ref 11.2–14.5)
WBC: 7 10*3/uL (ref 3.9–10.3)

## 2018-03-02 LAB — COMPREHENSIVE METABOLIC PANEL
ALT: 26 U/L (ref 0–55)
AST: 21 U/L (ref 5–34)
Albumin: 4.1 g/dL (ref 3.5–5.0)
Alkaline Phosphatase: 75 U/L (ref 40–150)
Anion gap: 8 (ref 3–11)
BUN: 13 mg/dL (ref 7–26)
CHLORIDE: 109 mmol/L (ref 98–109)
CO2: 24 mmol/L (ref 22–29)
CREATININE: 0.78 mg/dL (ref 0.60–1.10)
Calcium: 9.6 mg/dL (ref 8.4–10.4)
GFR calc Af Amer: >60 mL/min (ref >60)
Glucose, Bld: 92 mg/dL (ref 70–140)
POTASSIUM: 3.8 mmol/L (ref 3.5–5.1)
Sodium: 141 mmol/L (ref 136–145)
TOTAL PROTEIN: 7.5 g/dL (ref 6.4–8.3)
Total Bilirubin: 0.9 mg/dL (ref 0.2–1.2)

## 2018-03-02 NOTE — Telephone Encounter (Signed)
Gave patient AVs and calendar of upcoming April 2020 appointments.  °

## 2018-03-03 LAB — VITAMIN D 25 HYDROXY (VIT D DEFICIENCY, FRACTURES): VIT D 25 HYDROXY: 43.7 ng/mL (ref 30.0–100.0)

## 2018-03-10 DIAGNOSIS — J3089 Other allergic rhinitis: Secondary | ICD-10-CM | POA: Diagnosis not present

## 2018-03-10 DIAGNOSIS — J3081 Allergic rhinitis due to animal (cat) (dog) hair and dander: Secondary | ICD-10-CM | POA: Diagnosis not present

## 2018-03-10 DIAGNOSIS — J301 Allergic rhinitis due to pollen: Secondary | ICD-10-CM | POA: Diagnosis not present

## 2018-03-16 DIAGNOSIS — J301 Allergic rhinitis due to pollen: Secondary | ICD-10-CM | POA: Diagnosis not present

## 2018-03-16 DIAGNOSIS — J3089 Other allergic rhinitis: Secondary | ICD-10-CM | POA: Diagnosis not present

## 2018-03-16 DIAGNOSIS — J3081 Allergic rhinitis due to animal (cat) (dog) hair and dander: Secondary | ICD-10-CM | POA: Diagnosis not present

## 2018-03-16 DIAGNOSIS — I10 Essential (primary) hypertension: Secondary | ICD-10-CM | POA: Diagnosis not present

## 2018-03-16 DIAGNOSIS — E039 Hypothyroidism, unspecified: Secondary | ICD-10-CM | POA: Diagnosis not present

## 2018-03-18 DIAGNOSIS — J3089 Other allergic rhinitis: Secondary | ICD-10-CM | POA: Diagnosis not present

## 2018-03-18 DIAGNOSIS — J3081 Allergic rhinitis due to animal (cat) (dog) hair and dander: Secondary | ICD-10-CM | POA: Diagnosis not present

## 2018-03-24 DIAGNOSIS — J3081 Allergic rhinitis due to animal (cat) (dog) hair and dander: Secondary | ICD-10-CM | POA: Diagnosis not present

## 2018-03-24 DIAGNOSIS — J3089 Other allergic rhinitis: Secondary | ICD-10-CM | POA: Diagnosis not present

## 2018-04-01 DIAGNOSIS — J3089 Other allergic rhinitis: Secondary | ICD-10-CM | POA: Diagnosis not present

## 2018-04-01 DIAGNOSIS — R0600 Dyspnea, unspecified: Secondary | ICD-10-CM | POA: Diagnosis not present

## 2018-04-01 DIAGNOSIS — R0602 Shortness of breath: Secondary | ICD-10-CM | POA: Diagnosis not present

## 2018-04-01 DIAGNOSIS — J3081 Allergic rhinitis due to animal (cat) (dog) hair and dander: Secondary | ICD-10-CM | POA: Diagnosis not present

## 2018-04-01 DIAGNOSIS — R05 Cough: Secondary | ICD-10-CM | POA: Diagnosis not present

## 2018-04-01 DIAGNOSIS — J45909 Unspecified asthma, uncomplicated: Secondary | ICD-10-CM | POA: Diagnosis not present

## 2018-04-07 DIAGNOSIS — J4520 Mild intermittent asthma, uncomplicated: Secondary | ICD-10-CM | POA: Diagnosis not present

## 2018-04-07 DIAGNOSIS — J3089 Other allergic rhinitis: Secondary | ICD-10-CM | POA: Diagnosis not present

## 2018-04-07 DIAGNOSIS — J301 Allergic rhinitis due to pollen: Secondary | ICD-10-CM | POA: Diagnosis not present

## 2018-04-07 DIAGNOSIS — R600 Localized edema: Secondary | ICD-10-CM | POA: Diagnosis not present

## 2018-04-07 DIAGNOSIS — J45909 Unspecified asthma, uncomplicated: Secondary | ICD-10-CM | POA: Diagnosis not present

## 2018-04-14 DIAGNOSIS — J3089 Other allergic rhinitis: Secondary | ICD-10-CM | POA: Diagnosis not present

## 2018-04-14 DIAGNOSIS — R0600 Dyspnea, unspecified: Secondary | ICD-10-CM | POA: Diagnosis not present

## 2018-04-14 DIAGNOSIS — J3081 Allergic rhinitis due to animal (cat) (dog) hair and dander: Secondary | ICD-10-CM | POA: Diagnosis not present

## 2018-04-14 DIAGNOSIS — E039 Hypothyroidism, unspecified: Secondary | ICD-10-CM | POA: Diagnosis not present

## 2018-04-14 DIAGNOSIS — I10 Essential (primary) hypertension: Secondary | ICD-10-CM | POA: Diagnosis not present

## 2018-04-14 DIAGNOSIS — J301 Allergic rhinitis due to pollen: Secondary | ICD-10-CM | POA: Diagnosis not present

## 2018-04-14 DIAGNOSIS — R609 Edema, unspecified: Secondary | ICD-10-CM | POA: Diagnosis not present

## 2018-04-19 DIAGNOSIS — I447 Left bundle-branch block, unspecified: Secondary | ICD-10-CM | POA: Diagnosis not present

## 2018-04-19 DIAGNOSIS — J3081 Allergic rhinitis due to animal (cat) (dog) hair and dander: Secondary | ICD-10-CM | POA: Diagnosis not present

## 2018-04-19 DIAGNOSIS — J3089 Other allergic rhinitis: Secondary | ICD-10-CM | POA: Diagnosis not present

## 2018-04-19 DIAGNOSIS — Z0189 Encounter for other specified special examinations: Secondary | ICD-10-CM | POA: Diagnosis not present

## 2018-04-19 DIAGNOSIS — I10 Essential (primary) hypertension: Secondary | ICD-10-CM | POA: Diagnosis not present

## 2018-04-19 DIAGNOSIS — I5033 Acute on chronic diastolic (congestive) heart failure: Secondary | ICD-10-CM | POA: Diagnosis not present

## 2018-04-29 DIAGNOSIS — J301 Allergic rhinitis due to pollen: Secondary | ICD-10-CM | POA: Diagnosis not present

## 2018-04-29 DIAGNOSIS — R609 Edema, unspecified: Secondary | ICD-10-CM | POA: Diagnosis not present

## 2018-04-29 DIAGNOSIS — J019 Acute sinusitis, unspecified: Secondary | ICD-10-CM | POA: Diagnosis not present

## 2018-04-29 DIAGNOSIS — J3089 Other allergic rhinitis: Secondary | ICD-10-CM | POA: Diagnosis not present

## 2018-04-29 DIAGNOSIS — R600 Localized edema: Secondary | ICD-10-CM | POA: Diagnosis not present

## 2018-05-03 DIAGNOSIS — J301 Allergic rhinitis due to pollen: Secondary | ICD-10-CM | POA: Diagnosis not present

## 2018-05-03 DIAGNOSIS — J3089 Other allergic rhinitis: Secondary | ICD-10-CM | POA: Diagnosis not present

## 2018-05-03 DIAGNOSIS — I10 Essential (primary) hypertension: Secondary | ICD-10-CM | POA: Diagnosis not present

## 2018-05-10 DIAGNOSIS — I509 Heart failure, unspecified: Secondary | ICD-10-CM | POA: Diagnosis not present

## 2018-05-10 DIAGNOSIS — J3081 Allergic rhinitis due to animal (cat) (dog) hair and dander: Secondary | ICD-10-CM | POA: Diagnosis not present

## 2018-05-10 DIAGNOSIS — I10 Essential (primary) hypertension: Secondary | ICD-10-CM | POA: Diagnosis not present

## 2018-05-10 DIAGNOSIS — J301 Allergic rhinitis due to pollen: Secondary | ICD-10-CM | POA: Diagnosis not present

## 2018-05-10 DIAGNOSIS — J3089 Other allergic rhinitis: Secondary | ICD-10-CM | POA: Diagnosis not present

## 2018-05-10 DIAGNOSIS — J0110 Acute frontal sinusitis, unspecified: Secondary | ICD-10-CM | POA: Diagnosis not present

## 2018-05-10 DIAGNOSIS — E7800 Pure hypercholesterolemia, unspecified: Secondary | ICD-10-CM | POA: Diagnosis not present

## 2018-05-13 DIAGNOSIS — R0609 Other forms of dyspnea: Secondary | ICD-10-CM | POA: Diagnosis not present

## 2018-05-25 DIAGNOSIS — J3081 Allergic rhinitis due to animal (cat) (dog) hair and dander: Secondary | ICD-10-CM | POA: Diagnosis not present

## 2018-05-25 DIAGNOSIS — I10 Essential (primary) hypertension: Secondary | ICD-10-CM | POA: Diagnosis not present

## 2018-05-25 DIAGNOSIS — J3089 Other allergic rhinitis: Secondary | ICD-10-CM | POA: Diagnosis not present

## 2018-05-25 DIAGNOSIS — Z0189 Encounter for other specified special examinations: Secondary | ICD-10-CM | POA: Diagnosis not present

## 2018-05-25 DIAGNOSIS — I5041 Acute combined systolic (congestive) and diastolic (congestive) heart failure: Secondary | ICD-10-CM | POA: Diagnosis not present

## 2018-05-25 DIAGNOSIS — I447 Left bundle-branch block, unspecified: Secondary | ICD-10-CM | POA: Diagnosis not present

## 2018-05-31 DIAGNOSIS — I5041 Acute combined systolic (congestive) and diastolic (congestive) heart failure: Secondary | ICD-10-CM | POA: Diagnosis not present

## 2018-06-07 DIAGNOSIS — J3081 Allergic rhinitis due to animal (cat) (dog) hair and dander: Secondary | ICD-10-CM | POA: Diagnosis not present

## 2018-06-07 DIAGNOSIS — I10 Essential (primary) hypertension: Secondary | ICD-10-CM | POA: Diagnosis not present

## 2018-06-07 DIAGNOSIS — I447 Left bundle-branch block, unspecified: Secondary | ICD-10-CM | POA: Diagnosis not present

## 2018-06-07 DIAGNOSIS — I5041 Acute combined systolic (congestive) and diastolic (congestive) heart failure: Secondary | ICD-10-CM | POA: Diagnosis not present

## 2018-06-07 DIAGNOSIS — J3089 Other allergic rhinitis: Secondary | ICD-10-CM | POA: Diagnosis not present

## 2018-06-07 DIAGNOSIS — E785 Hyperlipidemia, unspecified: Secondary | ICD-10-CM | POA: Diagnosis not present

## 2018-06-08 ENCOUNTER — Other Ambulatory Visit: Payer: Self-pay | Admitting: Oncology

## 2018-06-09 ENCOUNTER — Other Ambulatory Visit: Payer: Self-pay | Admitting: Oncology

## 2018-06-11 DIAGNOSIS — J019 Acute sinusitis, unspecified: Secondary | ICD-10-CM | POA: Diagnosis not present

## 2018-06-11 DIAGNOSIS — I509 Heart failure, unspecified: Secondary | ICD-10-CM | POA: Diagnosis not present

## 2018-06-11 DIAGNOSIS — Z0000 Encounter for general adult medical examination without abnormal findings: Secondary | ICD-10-CM | POA: Diagnosis not present

## 2018-06-16 DIAGNOSIS — J3089 Other allergic rhinitis: Secondary | ICD-10-CM | POA: Diagnosis not present

## 2018-06-16 DIAGNOSIS — J3081 Allergic rhinitis due to animal (cat) (dog) hair and dander: Secondary | ICD-10-CM | POA: Diagnosis not present

## 2018-06-21 DIAGNOSIS — J3081 Allergic rhinitis due to animal (cat) (dog) hair and dander: Secondary | ICD-10-CM | POA: Diagnosis not present

## 2018-06-21 DIAGNOSIS — I10 Essential (primary) hypertension: Secondary | ICD-10-CM | POA: Diagnosis not present

## 2018-06-21 DIAGNOSIS — I5041 Acute combined systolic (congestive) and diastolic (congestive) heart failure: Secondary | ICD-10-CM | POA: Diagnosis not present

## 2018-06-21 DIAGNOSIS — I447 Left bundle-branch block, unspecified: Secondary | ICD-10-CM | POA: Diagnosis not present

## 2018-06-21 DIAGNOSIS — J3089 Other allergic rhinitis: Secondary | ICD-10-CM | POA: Diagnosis not present

## 2018-06-21 DIAGNOSIS — E785 Hyperlipidemia, unspecified: Secondary | ICD-10-CM | POA: Diagnosis not present

## 2018-06-30 DIAGNOSIS — J3089 Other allergic rhinitis: Secondary | ICD-10-CM | POA: Diagnosis not present

## 2018-06-30 DIAGNOSIS — J3081 Allergic rhinitis due to animal (cat) (dog) hair and dander: Secondary | ICD-10-CM | POA: Diagnosis not present

## 2018-07-05 DIAGNOSIS — I5041 Acute combined systolic (congestive) and diastolic (congestive) heart failure: Secondary | ICD-10-CM | POA: Diagnosis not present

## 2018-07-08 DIAGNOSIS — J3081 Allergic rhinitis due to animal (cat) (dog) hair and dander: Secondary | ICD-10-CM | POA: Diagnosis not present

## 2018-07-08 DIAGNOSIS — I5042 Chronic combined systolic (congestive) and diastolic (congestive) heart failure: Secondary | ICD-10-CM | POA: Diagnosis not present

## 2018-07-08 DIAGNOSIS — I10 Essential (primary) hypertension: Secondary | ICD-10-CM | POA: Diagnosis not present

## 2018-07-08 DIAGNOSIS — I447 Left bundle-branch block, unspecified: Secondary | ICD-10-CM | POA: Diagnosis not present

## 2018-07-08 DIAGNOSIS — J3089 Other allergic rhinitis: Secondary | ICD-10-CM | POA: Diagnosis not present

## 2018-07-08 DIAGNOSIS — E785 Hyperlipidemia, unspecified: Secondary | ICD-10-CM | POA: Diagnosis not present

## 2018-07-15 DIAGNOSIS — J3081 Allergic rhinitis due to animal (cat) (dog) hair and dander: Secondary | ICD-10-CM | POA: Diagnosis not present

## 2018-07-15 DIAGNOSIS — J3089 Other allergic rhinitis: Secondary | ICD-10-CM | POA: Diagnosis not present

## 2018-07-28 DIAGNOSIS — J3081 Allergic rhinitis due to animal (cat) (dog) hair and dander: Secondary | ICD-10-CM | POA: Diagnosis not present

## 2018-07-28 DIAGNOSIS — H1045 Other chronic allergic conjunctivitis: Secondary | ICD-10-CM | POA: Diagnosis not present

## 2018-07-28 DIAGNOSIS — J4540 Moderate persistent asthma, uncomplicated: Secondary | ICD-10-CM | POA: Diagnosis not present

## 2018-07-28 DIAGNOSIS — J301 Allergic rhinitis due to pollen: Secondary | ICD-10-CM | POA: Diagnosis not present

## 2018-07-28 DIAGNOSIS — J3089 Other allergic rhinitis: Secondary | ICD-10-CM | POA: Diagnosis not present

## 2018-08-02 ENCOUNTER — Ambulatory Visit: Payer: Medicare Other | Admitting: Internal Medicine

## 2018-08-04 DIAGNOSIS — H1045 Other chronic allergic conjunctivitis: Secondary | ICD-10-CM | POA: Diagnosis not present

## 2018-08-05 DIAGNOSIS — J3089 Other allergic rhinitis: Secondary | ICD-10-CM | POA: Diagnosis not present

## 2018-08-05 DIAGNOSIS — J301 Allergic rhinitis due to pollen: Secondary | ICD-10-CM | POA: Diagnosis not present

## 2018-08-05 DIAGNOSIS — I10 Essential (primary) hypertension: Secondary | ICD-10-CM | POA: Diagnosis not present

## 2018-08-12 DIAGNOSIS — J3089 Other allergic rhinitis: Secondary | ICD-10-CM | POA: Diagnosis not present

## 2018-08-12 DIAGNOSIS — J3081 Allergic rhinitis due to animal (cat) (dog) hair and dander: Secondary | ICD-10-CM | POA: Diagnosis not present

## 2018-08-12 DIAGNOSIS — I10 Essential (primary) hypertension: Secondary | ICD-10-CM | POA: Diagnosis not present

## 2018-08-12 DIAGNOSIS — I447 Left bundle-branch block, unspecified: Secondary | ICD-10-CM | POA: Diagnosis not present

## 2018-08-12 DIAGNOSIS — Z0189 Encounter for other specified special examinations: Secondary | ICD-10-CM | POA: Diagnosis not present

## 2018-08-12 DIAGNOSIS — I5042 Chronic combined systolic (congestive) and diastolic (congestive) heart failure: Secondary | ICD-10-CM | POA: Diagnosis not present

## 2018-08-26 DIAGNOSIS — J3089 Other allergic rhinitis: Secondary | ICD-10-CM | POA: Diagnosis not present

## 2018-08-26 DIAGNOSIS — J3081 Allergic rhinitis due to animal (cat) (dog) hair and dander: Secondary | ICD-10-CM | POA: Diagnosis not present

## 2018-08-31 DIAGNOSIS — Z23 Encounter for immunization: Secondary | ICD-10-CM | POA: Diagnosis not present

## 2018-09-08 DIAGNOSIS — J3089 Other allergic rhinitis: Secondary | ICD-10-CM | POA: Diagnosis not present

## 2018-09-08 DIAGNOSIS — J3081 Allergic rhinitis due to animal (cat) (dog) hair and dander: Secondary | ICD-10-CM | POA: Diagnosis not present

## 2018-09-14 DIAGNOSIS — I10 Essential (primary) hypertension: Secondary | ICD-10-CM | POA: Diagnosis not present

## 2018-09-14 DIAGNOSIS — J3081 Allergic rhinitis due to animal (cat) (dog) hair and dander: Secondary | ICD-10-CM | POA: Diagnosis not present

## 2018-09-14 DIAGNOSIS — E039 Hypothyroidism, unspecified: Secondary | ICD-10-CM | POA: Diagnosis not present

## 2018-09-14 DIAGNOSIS — J3089 Other allergic rhinitis: Secondary | ICD-10-CM | POA: Diagnosis not present

## 2018-09-20 DIAGNOSIS — J3089 Other allergic rhinitis: Secondary | ICD-10-CM | POA: Diagnosis not present

## 2018-09-20 DIAGNOSIS — I5043 Acute on chronic combined systolic (congestive) and diastolic (congestive) heart failure: Secondary | ICD-10-CM | POA: Diagnosis not present

## 2018-09-20 DIAGNOSIS — Z0189 Encounter for other specified special examinations: Secondary | ICD-10-CM | POA: Diagnosis not present

## 2018-09-20 DIAGNOSIS — I10 Essential (primary) hypertension: Secondary | ICD-10-CM | POA: Diagnosis not present

## 2018-09-20 DIAGNOSIS — J3081 Allergic rhinitis due to animal (cat) (dog) hair and dander: Secondary | ICD-10-CM | POA: Diagnosis not present

## 2018-09-20 DIAGNOSIS — I5042 Chronic combined systolic (congestive) and diastolic (congestive) heart failure: Secondary | ICD-10-CM | POA: Diagnosis not present

## 2018-09-24 ENCOUNTER — Encounter (HOSPITAL_COMMUNITY): Payer: Self-pay

## 2018-09-24 ENCOUNTER — Inpatient Hospital Stay (HOSPITAL_COMMUNITY)
Admission: EM | Admit: 2018-09-24 | Discharge: 2018-09-28 | DRG: 291 | Disposition: A | Discharge status: Home / Self Care | Payer: Medicare Other | Attending: Internal Medicine | Admitting: Internal Medicine

## 2018-09-24 ENCOUNTER — Other Ambulatory Visit: Payer: Self-pay

## 2018-09-24 ENCOUNTER — Emergency Department (HOSPITAL_COMMUNITY): Payer: Medicare Other

## 2018-09-24 DIAGNOSIS — I5043 Acute on chronic combined systolic (congestive) and diastolic (congestive) heart failure: Secondary | ICD-10-CM | POA: Diagnosis present

## 2018-09-24 DIAGNOSIS — Z881 Allergy status to other antibiotic agents status: Secondary | ICD-10-CM

## 2018-09-24 DIAGNOSIS — E669 Obesity, unspecified: Secondary | ICD-10-CM | POA: Diagnosis present

## 2018-09-24 DIAGNOSIS — Z6832 Body mass index (BMI) 32.0-32.9, adult: Secondary | ICD-10-CM

## 2018-09-24 DIAGNOSIS — E876 Hypokalemia: Secondary | ICD-10-CM | POA: Diagnosis present

## 2018-09-24 DIAGNOSIS — I4891 Unspecified atrial fibrillation: Secondary | ICD-10-CM | POA: Diagnosis not present

## 2018-09-24 DIAGNOSIS — N183 Chronic kidney disease, stage 3 (moderate): Secondary | ICD-10-CM | POA: Diagnosis not present

## 2018-09-24 DIAGNOSIS — Z885 Allergy status to narcotic agent status: Secondary | ICD-10-CM | POA: Diagnosis not present

## 2018-09-24 DIAGNOSIS — N179 Acute kidney failure, unspecified: Secondary | ICD-10-CM | POA: Diagnosis present

## 2018-09-24 DIAGNOSIS — I130 Hypertensive heart and chronic kidney disease with heart failure and stage 1 through stage 4 chronic kidney disease, or unspecified chronic kidney disease: Principal | ICD-10-CM | POA: Diagnosis present

## 2018-09-24 DIAGNOSIS — T502X5A Adverse effect of carbonic-anhydrase inhibitors, benzothiadiazides and other diuretics, initial encounter: Secondary | ICD-10-CM | POA: Diagnosis present

## 2018-09-24 DIAGNOSIS — I420 Dilated cardiomyopathy: Secondary | ICD-10-CM | POA: Diagnosis present

## 2018-09-24 DIAGNOSIS — J45909 Unspecified asthma, uncomplicated: Secondary | ICD-10-CM | POA: Diagnosis present

## 2018-09-24 DIAGNOSIS — I110 Hypertensive heart disease with heart failure: Secondary | ICD-10-CM | POA: Diagnosis not present

## 2018-09-24 DIAGNOSIS — Z9111 Patient's noncompliance with dietary regimen: Secondary | ICD-10-CM | POA: Diagnosis not present

## 2018-09-24 DIAGNOSIS — K219 Gastro-esophageal reflux disease without esophagitis: Secondary | ICD-10-CM | POA: Diagnosis present

## 2018-09-24 DIAGNOSIS — I10 Essential (primary) hypertension: Secondary | ICD-10-CM | POA: Diagnosis not present

## 2018-09-24 DIAGNOSIS — Z853 Personal history of malignant neoplasm of breast: Secondary | ICD-10-CM | POA: Diagnosis not present

## 2018-09-24 DIAGNOSIS — Z7951 Long term (current) use of inhaled steroids: Secondary | ICD-10-CM

## 2018-09-24 DIAGNOSIS — Z888 Allergy status to other drugs, medicaments and biological substances status: Secondary | ICD-10-CM

## 2018-09-24 DIAGNOSIS — E6609 Other obesity due to excess calories: Secondary | ICD-10-CM | POA: Diagnosis not present

## 2018-09-24 DIAGNOSIS — E039 Hypothyroidism, unspecified: Secondary | ICD-10-CM | POA: Diagnosis present

## 2018-09-24 DIAGNOSIS — Z9889 Other specified postprocedural states: Secondary | ICD-10-CM

## 2018-09-24 DIAGNOSIS — Z923 Personal history of irradiation: Secondary | ICD-10-CM

## 2018-09-24 DIAGNOSIS — E785 Hyperlipidemia, unspecified: Secondary | ICD-10-CM | POA: Diagnosis present

## 2018-09-24 DIAGNOSIS — N184 Chronic kidney disease, stage 4 (severe): Secondary | ICD-10-CM | POA: Diagnosis present

## 2018-09-24 DIAGNOSIS — Z79899 Other long term (current) drug therapy: Secondary | ICD-10-CM | POA: Diagnosis not present

## 2018-09-24 DIAGNOSIS — R112 Nausea with vomiting, unspecified: Secondary | ICD-10-CM | POA: Diagnosis present

## 2018-09-24 DIAGNOSIS — I5023 Acute on chronic systolic (congestive) heart failure: Secondary | ICD-10-CM | POA: Diagnosis not present

## 2018-09-24 DIAGNOSIS — R0602 Shortness of breath: Secondary | ICD-10-CM | POA: Diagnosis not present

## 2018-09-24 DIAGNOSIS — I480 Paroxysmal atrial fibrillation: Secondary | ICD-10-CM | POA: Diagnosis present

## 2018-09-24 DIAGNOSIS — J449 Chronic obstructive pulmonary disease, unspecified: Secondary | ICD-10-CM | POA: Diagnosis present

## 2018-09-24 DIAGNOSIS — E559 Vitamin D deficiency, unspecified: Secondary | ICD-10-CM | POA: Diagnosis present

## 2018-09-24 DIAGNOSIS — I129 Hypertensive chronic kidney disease with stage 1 through stage 4 chronic kidney disease, or unspecified chronic kidney disease: Secondary | ICD-10-CM | POA: Diagnosis not present

## 2018-09-24 DIAGNOSIS — I509 Heart failure, unspecified: Secondary | ICD-10-CM | POA: Diagnosis not present

## 2018-09-24 LAB — BASIC METABOLIC PANEL
Anion gap: 14 (ref 5–15)
BUN: 24 mg/dL — ABNORMAL HIGH (ref 8–23)
CALCIUM: 8.6 mg/dL — AB (ref 8.9–10.3)
CHLORIDE: 104 mmol/L (ref 98–111)
CO2: 24 mmol/L (ref 22–32)
CREATININE: 0.97 mg/dL (ref 0.44–1.00)
GFR calc Af Amer: >60 mL/min (ref >60)
GFR calc non Af Amer: 55 mL/min — ABNORMAL LOW (ref >60)
GLUCOSE: 108 mg/dL — AB (ref 70–99)
Potassium: 2.8 mmol/L — ABNORMAL LOW (ref 3.5–5.1)
Sodium: 142 mmol/L (ref 135–145)

## 2018-09-24 LAB — CBC
HEMATOCRIT: 44.7 % (ref 36.0–46.0)
Hemoglobin: 14.2 g/dL (ref 12.0–15.0)
MCH: 31.1 pg (ref 26.0–34.0)
MCHC: 31.8 g/dL (ref 30.0–36.0)
MCV: 98 fL (ref 80.0–100.0)
PLATELETS: 164 10*3/uL (ref 150–400)
RBC: 4.56 MIL/uL (ref 3.87–5.11)
RDW: 13.8 % (ref 11.5–15.5)
WBC: 9.2 10*3/uL (ref 4.0–10.5)
nRBC: 0 % (ref 0.0–0.2)

## 2018-09-24 LAB — MAGNESIUM: Magnesium: 1.7 mg/dL (ref 1.7–2.4)

## 2018-09-24 LAB — HEPATIC FUNCTION PANEL
ALK PHOS: 54 U/L (ref 38–126)
ALT: 23 U/L (ref 0–44)
AST: 27 U/L (ref 15–41)
Albumin: 3.7 g/dL (ref 3.5–5.0)
Bilirubin, Direct: 0.3 mg/dL — ABNORMAL HIGH (ref 0.0–0.2)
Indirect Bilirubin: 1 mg/dL — ABNORMAL HIGH (ref 0.3–0.9)
Total Bilirubin: 1.3 mg/dL — ABNORMAL HIGH (ref 0.3–1.2)
Total Protein: 6.4 g/dL — ABNORMAL LOW (ref 6.5–8.1)

## 2018-09-24 LAB — I-STAT TROPONIN, ED: Troponin i, poc: 0.01 ng/mL (ref 0.00–0.08)

## 2018-09-24 LAB — BRAIN NATRIURETIC PEPTIDE: B Natriuretic Peptide: 2107.8 pg/mL — ABNORMAL HIGH (ref 0.0–100.0)

## 2018-09-24 MED ORDER — ATORVASTATIN CALCIUM 10 MG PO TABS
10.0000 mg | ORAL_TABLET | Freq: Every day | ORAL | Status: DC
  Administered 2018-09-25 – 2018-09-28 (×4): 10 mg via ORAL
  Filled 2018-09-24 (×4): qty 1

## 2018-09-24 MED ORDER — POTASSIUM CHLORIDE CRYS ER 20 MEQ PO TBCR
40.0000 meq | EXTENDED_RELEASE_TABLET | Freq: Once | ORAL | Status: AC
  Administered 2018-09-24: 40 meq via ORAL
  Filled 2018-09-24: qty 2

## 2018-09-24 MED ORDER — ANASTROZOLE 1 MG PO TABS
1.0000 mg | ORAL_TABLET | Freq: Every day | ORAL | Status: DC
  Administered 2018-09-25 – 2018-09-28 (×4): 1 mg via ORAL
  Filled 2018-09-24 (×4): qty 1

## 2018-09-24 MED ORDER — MOMETASONE FURO-FORMOTEROL FUM 100-5 MCG/ACT IN AERO
2.0000 | INHALATION_SPRAY | Freq: Every day | RESPIRATORY_TRACT | Status: DC | PRN
  Filled 2018-09-24: qty 8.8

## 2018-09-24 MED ORDER — LORATADINE 10 MG PO TABS
10.0000 mg | ORAL_TABLET | Freq: Every day | ORAL | Status: DC
  Administered 2018-09-25 – 2018-09-28 (×4): 10 mg via ORAL
  Filled 2018-09-24 (×4): qty 1

## 2018-09-24 MED ORDER — FUROSEMIDE 10 MG/ML IJ SOLN
40.0000 mg | Freq: Once | INTRAMUSCULAR | Status: AC
  Administered 2018-09-24: 40 mg via INTRAVENOUS
  Filled 2018-09-24: qty 4

## 2018-09-24 MED ORDER — SPIRONOLACTONE 25 MG PO TABS
25.0000 mg | ORAL_TABLET | Freq: Every day | ORAL | Status: DC
  Administered 2018-09-25 – 2018-09-26 (×2): 25 mg via ORAL
  Filled 2018-09-24 (×2): qty 1

## 2018-09-24 MED ORDER — DILTIAZEM HCL 25 MG/5ML IV SOLN
10.0000 mg | Freq: Once | INTRAVENOUS | Status: AC
  Administered 2018-09-24: 10 mg via INTRAVENOUS
  Filled 2018-09-24: qty 5

## 2018-09-24 MED ORDER — ACETAMINOPHEN 325 MG PO TABS: 650.0000 mg | ORAL_TABLET | ORAL | Status: DC | PRN

## 2018-09-24 MED ORDER — LEVOTHYROXINE SODIUM 88 MCG PO TABS
88.0000 ug | ORAL_TABLET | Freq: Every day | ORAL | Status: DC
  Administered 2018-09-25 – 2018-09-28 (×4): 88 ug via ORAL
  Filled 2018-09-24 (×4): qty 1

## 2018-09-24 MED ORDER — ONDANSETRON HCL 4 MG/2ML IJ SOLN
4.0000 mg | Freq: Four times a day (QID) | INTRAMUSCULAR | Status: DC | PRN
  Administered 2018-09-25 – 2018-09-27 (×4): 4 mg via INTRAVENOUS
  Filled 2018-09-24 (×4): qty 2

## 2018-09-24 MED ORDER — MOMETASONE FURO-FORMOTEROL FUM 200-5 MCG/ACT IN AERO
2.0000 | INHALATION_SPRAY | Freq: Two times a day (BID) | RESPIRATORY_TRACT | Status: DC
  Administered 2018-09-25 – 2018-09-28 (×7): 2 via RESPIRATORY_TRACT
  Filled 2018-09-24: qty 8.8

## 2018-09-24 MED ORDER — POTASSIUM CHLORIDE 10 MEQ/100ML IV SOLN
10.0000 meq | INTRAVENOUS | Status: AC
  Administered 2018-09-24 – 2018-09-25 (×2): 10 meq via INTRAVENOUS
  Filled 2018-09-24 (×2): qty 100

## 2018-09-24 MED ORDER — IBUPROFEN 200 MG PO TABS: 400.0000 mg | ORAL_TABLET | Freq: Four times a day (QID) | ORAL | Status: DC | PRN

## 2018-09-24 MED ORDER — ACETAMINOPHEN 500 MG PO TABS: 500.0000 mg | ORAL_TABLET | Freq: Four times a day (QID) | ORAL | Status: DC | PRN

## 2018-09-24 MED ORDER — METOPROLOL TARTRATE 25 MG PO TABS
25.0000 mg | ORAL_TABLET | Freq: Two times a day (BID) | ORAL | Status: DC
  Administered 2018-09-25 (×2): 25 mg via ORAL
  Filled 2018-09-24 (×2): qty 1

## 2018-09-24 MED ORDER — FUROSEMIDE 10 MG/ML IJ SOLN
40.0000 mg | Freq: Two times a day (BID) | INTRAMUSCULAR | Status: DC
  Administered 2018-09-25: 40 mg via INTRAVENOUS
  Filled 2018-09-24: qty 4

## 2018-09-24 MED ORDER — SACUBITRIL-VALSARTAN 97-103 MG PO TABS
1.0000 | ORAL_TABLET | Freq: Two times a day (BID) | ORAL | Status: DC
  Administered 2018-09-25 – 2018-09-26 (×3): 1 via ORAL
  Filled 2018-09-24 (×3): qty 1

## 2018-09-24 MED ORDER — APIXABAN 5 MG PO TABS
5.0000 mg | ORAL_TABLET | Freq: Two times a day (BID) | ORAL | Status: DC
  Administered 2018-09-25 (×2): 5 mg via ORAL
  Filled 2018-09-24 (×2): qty 1

## 2018-09-24 NOTE — ED Notes (Signed)
2mL NS in L hand with IV potassium as ordered by provider.

## 2018-09-24 NOTE — ED Notes (Signed)
IV potassium chloride rate of infusion decreased by half due to burning at the site as ordered by MD.

## 2018-09-24 NOTE — H&P (Signed)
History and Physical   Erin Novak:[id number] DOB: 03-23-1941 DOA: 09/24/2018  Referring MD/NP/PA: Dr. Rex Kras  PCP: Erin Gravel, MD   Outpatient Specialists: Dr. Christen Butter, cardiologist  Patient coming from: Home  Chief Complaint: Shortness of breath and weakness  HPI: Erin Novak is a 77 y.o. female with medical history significant of left breast cancer, hypertension, congestive heart failure, morbid obesity, GERD, hyperlipidemia who apparently has been having progressive shortness of breath and leg swelling in the last week.  She was on Lasix 20 mg every other day but on Monday went to see her PCP who increased her Lasix to 40 mg daily.  Patient has been taking it for 3 days now and still feels short of breath.  She also continues to feel weak and decided to come to the emergency room where she is evaluated.  She appears to still have some CHF exacerbation but was also found to have atrial fibrillation with rapid ventricular response.  She has responded to a dose of Cardizem and rate is now controlled.  Due to patient's new onset A. fib and uncontrolled CHF she is being admitted for work-up..  ED Course: Temperature 98, blood pressure 116/93, pulse of 150, respiratory rate of 25 oxygen sats 99% room air.  White count is 9.3 Mcnew 14.2.  Sodium 142 potassium 2.8 chloride 104 CO2 of 24 BUN 24 creatinine 1.97 and calcium 8.6 glucose 108.  Chest x-ray showed evidence of cardiomegaly with pulmonary vascular congestion.  EKG showed atrial fibrillation with a rate of 150.  Review of Systems: As per HPI otherwise 10 point review of systems negative.    Past Medical History:  Diagnosis Date  . Abdominal pain   . Allergy   . Asthma   . Bronchitis   . Cholelithiasis 06-20-2011   [place]   . Diarrhea   . GERD (gastroesophageal reflux disease)   . Hyperlipidemia   . Hypertension   . Hypothyroidism   . Migraines   . Personal history of radiation therapy 2018  . PONV (postoperative nausea and  vomiting)   . Thyroid disease    hypothyroid  . Ulcer     Past Surgical History:  Procedure Laterality Date  . ABDOMINAL HYSTERECTOMY    . BREAST LUMPECTOMY Left 03/03/2017  . BREAST LUMPECTOMY WITH RADIOACTIVE SEED AND SENTINEL LYMPH NODE BIOPSY Left 03/03/2017   Procedure: LEFT BREAST LUMPECTOMY WITH RADIOACTIVE SEED X2 AND SENTINEL LYMPH NODE BIOPSY;  Surgeon: Erin Skates, MD;  Location: [facility];  Service: General;  Laterality: Left;  . CHOLECYSTECTOMY  12/25/2011   Procedure: LAPAROSCOPIC CHOLECYSTECTOMY WITH INTRAOPERATIVE CHOLANGIOGRAM;  Surgeon: Earnstine Regal, MD;  Location: [facility];  Service: General;  Laterality: N/A;  laparoscopic cholecystectomy with intraoperative cholangiogram  . COLONOSCOPY  01/10/2003   internal hemorrhoids (Dr. Lajoyce Corners)  . FOOT SURGERY     right  . hysterectomy    . ruptured disk     in neck  . TONSILLECTOMY    . UPPER GASTROINTESTINAL ENDOSCOPY  01/10/2003   hiatal hernia (Dr. Lajoyce Corners)     reports that she has never smoked. She has never used smokeless tobacco. She reports that she drinks alcohol. She reports that she does not use drugs.  Allergies  Allergen Reactions  . Amoxicillin-Pot Clavulanate     REACTION: rash  . Amoxicillin-Pot Clavulanate     Rash  . Lisinopril Other (See Comments)    cough  . Percodan [Oxycodone-Aspirin] Nausea And Vomiting and Other (See  Comments)    Sweating, unable to sleep  . Prednisone Other (See Comments)    Makes pt sweat, unable to sleep    Family History  Problem Relation Age of Onset  . Asthma Father   . Colon cancer Neg Hx   . Stomach cancer Neg Hx   . Esophageal cancer Neg Hx   . Rectal cancer Neg Hx      Prior to Admission medications   Medication Sig Start Date End Date Taking? Authorizing Provider  acetaminophen (TYLENOL) 500 MG tablet Take 500 mg by mouth every 6 (six) hours as needed for moderate pain.   Yes [provider]  ALBUTEROL SULFATE HFA IN Inhale 2 puffs into  the lungs every 4 (four) hours as needed (sob and wheezing).    Yes [provider]  anastrozole (ARIMIDEX) 1 MG tablet TAKE 1 TABLET BY MOUTH ONCE DAILY 06/09/18  Yes Magrinat, Erin Dad, MD  atorvastatin (LIPITOR) 10 MG tablet Take 10 mg by mouth daily.    Yes [provider]  budesonide-formoterol (SYMBICORT) 160-4.5 MCG/ACT inhaler Inhale 2 puffs into the lungs 2 (two) times daily as needed (sob and wheezing). For shortness of breath   Yes [provider]  furosemide (LASIX) 40 MG tablet Take 40 mg by mouth daily as needed for fluid or edema.   Yes [provider]  ibuprofen (ADVIL,MOTRIN) 200 MG tablet Take 400 mg by mouth every 6 (six) hours as needed. For pain    Yes [provider]  levothyroxine (SYNTHROID, LEVOTHROID) 88 MCG tablet Take 88 mcg by mouth every morning.    Yes [provider]  loratadine (CLARITIN) 10 MG tablet Take 10 mg by mouth daily.   Yes [provider]  sacubitril-valsartan (ENTRESTO) 97-103 MG Take 1 tablet by mouth 2 (two) times daily.   Yes [provider]  spironolactone (ALDACTONE) 25 MG tablet Take 25 mg by mouth daily.   Yes [provider]  anastrozole (ARIMIDEX) 1 MG tablet TAKE 1 TABLET BY MOUTH ONCE DAILY Patient not taking: Reported on 09/24/2018 06/10/18   Magrinat, Erin Dad, MD  mometasone-formoterol (DULERA) 100-5 MCG/ACT AERO Inhale 2 puffs into the lungs daily as needed for wheezing.    [provider]    Physical Exam: Vitals:   09/24/18 2040 09/24/18 2100 09/24/18 2200 09/24/18 2209  BP:  120/81 126/90   Pulse: (!) 150 91 93   Resp:  (!) 21 19   Temp:      TempSrc:      SpO2:  98% 99%   Weight:    85.4 kg  Height:          Constitutional: NAD, calm, comfortable Vitals:   09/24/18 2040 09/24/18 2100 09/24/18 2200 09/24/18 2209  BP:  120/81 126/90   Pulse: (!) 150 91 93   Resp:  (!) 21 19   Temp:      TempSrc:      SpO2:  98% 99%   Weight:     85.4 kg  Height:       Eyes: PERRL, lids and conjunctivae normal ENMT: Mucous membranes are moist. Posterior pharynx clear of any exudate or lesions.Normal dentition.  Neck: normal, supple, no masses, no thyromegaly Respiratory: clear to auscultation bilaterally, no wheezing, diffuse bilateral crackles. Normal respiratory effort. No accessory muscle use.  Cardiovascular: Irregularly irregular rhythm with tachycardia no murmurs / rubs / gallops. No extremity edema. 2+ pedal pulses. No carotid bruits.  Abdomen: no tenderness, no masses  palpated. No hepatosplenomegaly. Bowel sounds positive.  Musculoskeletal: no clubbing / cyanosis. No joint deformity upper and lower extremities. Good ROM, no contractures. Normal muscle tone.  Skin: no rashes, lesions, ulcers. No induration Neurologic: CN 2-12 grossly intact. Sensation intact, DTR normal. Strength 5/5 in all 4.  Psychiatric: Normal judgment and insight. Alert and oriented x 3. Normal mood.     Labs on Admission: I have personally reviewed following labs and imaging studies  CBC: Recent Labs  Lab 09/24/18 2053  WBC 9.2  HGB 14.2  HCT 44.7  MCV 98.0  PLT 119   Basic Metabolic Panel: Recent Labs  Lab 09/24/18 2053  NA 142  K 2.8*  CL 104  CO2 24  GLUCOSE 108*  BUN 24*  CREATININE 0.97  CALCIUM 8.6*   GFR: Estimated Creatinine Clearance: 48.4 mL/min (by C-G formula based on SCr of 0.97 mg/dL). Liver Function Tests: Recent Labs  Lab 09/24/18 2053  AST 27  ALT 23  ALKPHOS 54  BILITOT 1.3*  PROT 6.4*  ALBUMIN 3.7   No results for input(s): LIPASE, AMYLASE in the last 168 hours. No results for input(s): AMMONIA in the last 168 hours. Coagulation Profile: No results for input(s): INR, PROTIME in the last 168 hours. Cardiac Enzymes: No results for input(s): CKTOTAL, CKMB, CKMBINDEX, TROPONINI in the last 168 hours. BNP (last 3 results) No results for input(s): PROBNP in the last 8760 hours. HbA1C: No results for  input(s): HGBA1C in the last 72 hours. CBG: No results for input(s): GLUCAP in the last 168 hours. Lipid Profile: No results for input(s): CHOL, HDL, LDLCALC, TRIG, CHOLHDL, LDLDIRECT in the last 72 hours. Thyroid Function Tests: No results for input(s): TSH, T4TOTAL, FREET4, T3FREE, THYROIDAB in the last 72 hours. Anemia Panel: No results for input(s): VITAMINB12, FOLATE, FERRITIN, TIBC, IRON, RETICCTPCT in the last 72 hours. Urine analysis:    Component Value Date/Time   COLORURINE YELLOW 12/23/2011 1436   APPEARANCEUR CLOUDY (A) 12/23/2011 1436   LABSPEC 1.017 12/23/2011 1436   PHURINE 7.5 12/23/2011 1436   GLUCOSEU NEGATIVE 12/23/2011 1436   HGBUR NEGATIVE 12/23/2011 1436   BILIRUBINUR NEGATIVE 12/23/2011 1436   KETONESUR NEGATIVE 12/23/2011 1436   PROTEINUR NEGATIVE 12/23/2011 1436   UROBILINOGEN 0.2 12/23/2011 1436   NITRITE NEGATIVE 12/23/2011 1436   LEUKOCYTESUR NEGATIVE 12/23/2011 1436   Sepsis Labs: @LABRCNTIP (procalcitonin:4,lacticidven:4) )No results found for this or any previous visit (from the past 240 hour(s)).   Radiological Exams on Admission: Dg Chest 2 View  Result Date: 09/24/2018 CLINICAL DATA:  Shortness of breath.  Increased fatigue. EXAM: CHEST - 2 VIEW COMPARISON:  Two-view chest x-ray 12/23/2011. FINDINGS: There is marked interval enlargement of the heart. Mild pulmonary vascular congestion is present without frank edema. Small effusions are present. Progressive endplate degenerative changes are present in the thoracic spine. Vertebral body heights are maintained. IMPRESSION: 1. Progressive cardiomegaly and pulmonary vascular congestion consistent with early congestive heart failure. 2. Small bilateral pleural effusions. Electronically Signed   By: San Morelle M.D.   On: 09/24/2018 20:23    EKG: Independently reviewed.  EKG showed atrial fibrillation with occasional's sinus tach and wide complex tachycardia.  Assessment/Plan Principal  Problem:   New onset A-fib [facility]) Active Problems:   Hypothyroidism   Essential hypertension   Asthma   GERD (gastroesophageal reflux disease)   Obesity   Post-operative nausea and vomiting   CHF (congestive heart failure) (HCC)   Hypokalemia     #1 congestive heart failure: Patient still has  evidence of fluid overload.  We will place patient on IV diuretics.  Change Lasix to IV 40 mg twice a day.  Continue other cardiac medications.  Get echocardiogram and consider cardiology consult for Dr. Einar Gip in the morning.  Patient may have been in atrial fibrillation lately on and off which may have triggered her exacerbation of CHF.  #2 new onset atrial fibrillation: Patient has no documented history of A. fib.  With this finding we will start her on beta-blockers due to CHF.  She has received a dose of Cardizem in the ER.  We will avoid that for now.  Chads 2 score is at least 2.  I will initiate Eliquis  #3 hypokalemia: Profound probably due to diuretics.  Replete potassium and will need ongoing supplementation with Lasix.  #4 GERD: Continue with PPIs.  #5 history of breast cancer: Patient follows up with Dr. Jana Hakim in the outpatient setting.  #6 hypertension: Continue with home regimen of blood pressure medications.  Further treatment depends on patient's response.    #7 hypothyroidism: We will check TSH and free T4.  May be responsible for her A. fib if abnormal.  DVT prophylaxis: Eliquis Code Status: Full code Family Communication: Daughter in the room Disposition Plan: Home Consults called: Please consult Dr. Einar Gip in the morning Admission status: Inpatient  Severity of Illness: The appropriate patient status for this patient is INPATIENT. Inpatient status is judged to be reasonable and necessary in order to provide the required intensity of service to ensure the patient's safety. The patient's presenting symptoms, physical exam findings, and initial radiographic and laboratory  data in the context of their chronic comorbidities is felt to place them at high risk for further clinical deterioration. Furthermore, it is not anticipated that the patient will be medically stable for discharge from the hospital within 2 midnights of admission. The following factors support the patient status of inpatient.   " The patient's presenting symptoms include shortness of breath or chest pain. " The worrisome physical exam findings include evidence of fluid overload and irregularly irregular rhythm. " The initial radiographic and laboratory data are worrisome because of EKG showing atrial fibrillation with x-ray showing fluid overload. " The chronic co-morbidities include hypertension hypothyroidism.   * I certify that at the point of admission it is my clinical judgment that the patient will require inpatient hospital care spanning beyond 2 midnights from the point of admission due to high intensity of service, high risk for further deterioration and high frequency of surveillance required.Barbette Merino MD [facility] Pager [phone]  If 7PM-7AM, please contact night-coverage [email or web address] Password [facility]  09/24/2018, 10:44 PM

## 2018-09-24 NOTE — ED Provider Notes (Signed)
[facility] DEPT Provider Note   CSN: [id number] Arrival date & time: 09/24/18  1856     History   Chief Complaint Chief Complaint  Patient presents with  . Leg Swelling    BILATERAL    HPI Erin Novak is a 77 y.o. female.  77yo F w/ PMH including HTN, HLD, CHF who p/w leg swelling.  Patient reports 2 weeks of progressively worsening bilateral lower extremity edema.  She had been taking 20 mg Lasix daily for at least a week with no improvement.  She saw her PCP 4 days ago and was told to increase Lasix to 40 mg daily.  She has been compliant with medications but her symptoms have progressed.  She endorses associated dyspnea on exertion, no significant shortness of breath at rest.  She has also had paroxysmal nocturnal dyspnea.  She denies any chest pain.  She has intermittent chronic cough, no change recently.  She denies any fevers or vomiting but has had some fatigue.  She overall has gained approximately 20 pounds, 5 pounds from yesterday to today.  The history is provided by the patient.    Past Medical History:  Diagnosis Date  . Abdominal pain   . Allergy   . Asthma   . Bronchitis   . Cholelithiasis 06-20-2011   [place]   . Diarrhea   . GERD (gastroesophageal reflux disease)   . Hyperlipidemia   . Hypertension   . Hypothyroidism   . Migraines   . Personal history of radiation therapy 2018  . PONV (postoperative nausea and vomiting)   . Thyroid disease    hypothyroid  . Ulcer     Patient Active Problem List   Diagnosis Date Noted  . Post-operative nausea and vomiting 03/03/2017  . Malignant neoplasm of upper-outer quadrant of left breast in female, estrogen receptor positive ([place]) 01/28/2017  . Cholelithiasis without obstruction 07/15/2011  . Gallstones 07/10/2011  . GERD (gastroesophageal reflux disease) 07/10/2011  . Obesity 07/10/2011  . 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 HYPOTHYROIDISM 02/07/2008  . HYPERLIPIDEMIA 02/07/2008  .  HYPERTENSION 02/07/2008  . ASTHMA 02/07/2008    Past Surgical History:  Procedure Laterality Date  . ABDOMINAL HYSTERECTOMY    . BREAST LUMPECTOMY Left 03/03/2017  . BREAST LUMPECTOMY WITH RADIOACTIVE SEED AND SENTINEL LYMPH NODE BIOPSY Left 03/03/2017   Procedure: LEFT BREAST LUMPECTOMY WITH RADIOACTIVE SEED X2 AND SENTINEL LYMPH NODE BIOPSY;  Surgeon: Fanny Skates, MD;  Location: [facility];  Service: General;  Laterality: Left;  . CHOLECYSTECTOMY  12/25/2011   Procedure: LAPAROSCOPIC CHOLECYSTECTOMY WITH INTRAOPERATIVE CHOLANGIOGRAM;  Surgeon: Earnstine Regal, MD;  Location: [facility];  Service: General;  Laterality: N/A;  laparoscopic cholecystectomy with intraoperative cholangiogram  . COLONOSCOPY  01/10/2003   internal hemorrhoids (Dr. Lajoyce Corners)  . FOOT SURGERY     right  . hysterectomy    . ruptured disk     in neck  . TONSILLECTOMY    . UPPER GASTROINTESTINAL ENDOSCOPY  01/10/2003   hiatal hernia (Dr. Lajoyce Corners)     OB History   None      Home Medications    Prior to Admission medications   Medication Sig Start Date End Date Taking? Authorizing Provider  ALBUTEROL SULFATE HFA IN Inhale 2 puffs into the lungs every 4 (four) hours as needed.    [provider]  anastrozole (ARIMIDEX) 1 MG tablet TAKE 1 TABLET BY MOUTH ONCE DAILY 06/09/18   Magrinat, Virgie Dad, MD  anastrozole (ARIMIDEX)  1 MG tablet TAKE 1 TABLET BY MOUTH ONCE DAILY 06/10/18   Magrinat, Virgie Dad, MD  atorvastatin (LIPITOR) 10 MG tablet Take 10 mg by mouth once a week.    [provider]  budesonide-formoterol (SYMBICORT) 160-4.5 MCG/ACT inhaler Inhale 2 puffs into the lungs 2 (two) times daily as needed. For shortness of breath    [provider]  Cholecalciferol (VITAMIN D-3) 5000 UNITS TABS Take 5,000 Units by mouth daily.     [provider]  Cyanocobalamin (VITAMIN B 12 PO) Take by mouth.    [provider]  diltiazem (TIAZAC) 240 MG 24 hr capsule Take 240 mg by  mouth every morning.     [provider]  ibuprofen (ADVIL,MOTRIN) 200 MG tablet Take 400 mg by mouth every 6 (six) hours as needed. For pain     [provider]  levothyroxine (SYNTHROID, LEVOTHROID) 88 MCG tablet Take 88 mcg by mouth every morning.     [provider]  LISINOPRIL PO Take by mouth daily.    [provider]  mometasone-formoterol (DULERA) 100-5 MCG/ACT AERO Inhale 2 puffs into the lungs daily as needed for wheezing.    [provider]  Multiple Vitamins-Minerals (HAIR/SKIN/NAILS PO) Take 1 tablet by mouth daily.     [provider]  vitamin E 400 UNIT capsule Take 400 Units by mouth daily.    [provider]    Family History Family History  Problem Relation Age of Onset  . Asthma Father   . Colon cancer Neg Hx   . Stomach cancer Neg Hx   . Esophageal cancer Neg Hx   . Rectal cancer Neg Hx     Social History Social History   Tobacco Use  . Smoking status: Never Smoker  . Smokeless tobacco: Never Used  Substance Use Topics  . Alcohol use: Yes    Comment: rare  . Drug use: No     Allergies   Amoxicillin-pot clavulanate; Amoxicillin-pot clavulanate; Lisinopril; Percodan [oxycodone-aspirin]; and Prednisone   Review of Systems Review of Systems All other systems reviewed and are negative except that which was mentioned in HPI   Physical Exam Updated Vital Signs BP (!) 116/93 (BP Location: Left Arm)   Pulse (!) 107   Temp 98 F (36.7 C) (Oral)   Resp (!) 24   Ht 5\' 2"  (1.575 m)   Wt 86.2 kg   SpO2 97%   BMI 34.75 kg/m   Physical Exam  Constitutional: She is oriented to person, place, and time. She appears well-developed and well-nourished. No distress.  HENT:  Head: Normocephalic and atraumatic.  Moist mucous membranes  Eyes: Conjunctivae are normal.  Neck: Neck supple.  Cardiovascular: An irregularly irregular rhythm present. Tachycardia present.  Murmur heard. Pulmonary/Chest:  Effort normal. No respiratory distress. She has no rales.  Diminished breath sounds bilaterally  Abdominal: Soft. Bowel sounds are normal. She exhibits no distension. There is no tenderness.  Musculoskeletal: She exhibits edema (1+ pitting BLE).  Neurological: She is alert and oriented to person, place, and time.  Fluent speech  Skin: Skin is warm and dry.  Psychiatric: She has a normal mood and affect. Judgment normal.  Nursing note and vitals reviewed.    ED Treatments / Results  Labs (all labs ordered are listed, but only abnormal results are displayed) Labs Reviewed  BASIC METABOLIC PANEL - Abnormal; Notable for the following components:      Result Value   Potassium 2.8 (*)  Glucose, Bld 108 (*)    BUN 24 (*)    Calcium 8.6 (*)    GFR calc non Af Amer 55 (*)    All other components within normal limits  BRAIN NATRIURETIC PEPTIDE - Abnormal; Notable for the following components:   B Natriuretic Peptide 2,107.8 (*)    All other components within normal limits  HEPATIC FUNCTION PANEL - Abnormal; Notable for the following components:   Total Protein 6.4 (*)    Total Bilirubin 1.3 (*)    Bilirubin, Direct 0.3 (*)    Indirect Bilirubin 1.0 (*)    All other components within normal limits  CBC  MAGNESIUM  COMPREHENSIVE METABOLIC PANEL  TSH  TROPONIN I  TROPONIN I  TROPONIN I  CBC  LIPID PANEL  I-STAT TROPONIN, ED    EKG EKG Interpretation  Date/Time:  Friday September 24 2018 19:05:34 EST Ventricular Rate:  117 PR Interval:    QRS Duration: 179 QT Interval:  385 QTC Calculation: 514 R Axis:   173 Text Interpretation:  Sinus tachycardia Paired ventricular premature complexes Nonspecific intraventricular conduction delay Some artifact limits interpretation, suspect A fib, new from previous intermittent PVCs Confirmed by Theotis [facility] [phone]) on 09/24/2018 7:44:54 PM   Radiology No results found.  Procedures Procedures (including critical care  time)  Medications Ordered in ED Medications  diltiazem (CARDIZEM) injection 10 mg (has no administration in time range)   CHA2DS2/VAS Stroke Risk Points  Current as of 26 minutes ago     5 >= 2 Points: High Risk  1 - 1.99 Points: Medium Risk  0 Points: Low Risk    The patient's score has not changed in the past year.:  No Change     Details    This score determines the patient's risk of having a stroke if the  patient has atrial fibrillation.       Points Metrics  1 Has Congestive Heart Failure:  Yes    Current as of 26 minutes ago  0 Has Vascular Disease:  No    Current as of 26 minutes ago  1 Has Hypertension:  Yes    Current as of 26 minutes ago  2 Age:  32    Current as of 26 minutes ago  0 Has Diabetes:  No    Current as of 26 minutes ago  0 Had Stroke:  No  Had TIA:  No  Had thromboembolism:  No    Current as of 26 minutes ago  1 Female:  Yes    Current as of 26 minutes ago         This patients CHA2DS2-VASc Score and unadjusted Ischemic Stroke Rate (% per year) is equal to 7.2 % stroke rate/year from a score of 5  Above score calculated as 1 point each if present [CHF, HTN, DM, Vascular=MI/PAD/Aortic Plaque, Age if 65-74, or Female] Above score calculated as 2 points each if present [Age > 75, or Stroke/TIA/TE]    Initial Impression / Assessment and Plan / ED Course  I have reviewed the triage vital signs and the nursing notes.  Pertinent labs & imaging results that were available during my care of the patient were reviewed by me and considered in my medical decision making (see chart for details).    She was comfortable on exam, vital signs notable for variable heart rate from 110 intermittently up to 150 but usually 110-120. I suspect she may have new onset A fib based on EKG compared to  previous. This A fib w/ RVR may be driving her CHF exacerbation. Gave IV lasix and IV dilt.  Rate improved after single dose of diltiazem bolus.  Labs notable for potassium  2.8, normal troponin, BNP 2107.  Chest x-ray shows evidence of volume overload. CHADSVASC score of 5.  Discussed admission with [facility], Dr. Jonelle Sidle, who will order anticoagulation as patient denies any bleeding history.  Patient admitted for further work-up and treatment. Final Clinical Impressions(s) / ED Diagnoses   Final diagnoses:  None    ED Discharge Orders    None       , Wenda Overland, MD 09/25/18 [phone]

## 2018-09-24 NOTE — ED Triage Notes (Signed)
PT C/O BILATERAL LEG SWELLING X2 WEEKS. PT HAS A HX OF CHF, AND WAS SEEN BY HER PCP ON Monday, AND GIVEN A RX FOR LASIX 40MG  DAILY. PT STS SHE HAS FELT WORSE SINCE INCREASING THE DOSE, AND THE SWELLING AND FATIGUE HAS INCREASED, AS WELL. DENIES CP, BUT HAS SOB WITH EXERTION.

## 2018-09-25 ENCOUNTER — Inpatient Hospital Stay (HOSPITAL_COMMUNITY): Payer: Medicare Other

## 2018-09-25 ENCOUNTER — Other Ambulatory Visit: Payer: Self-pay

## 2018-09-25 DIAGNOSIS — E876 Hypokalemia: Secondary | ICD-10-CM

## 2018-09-25 DIAGNOSIS — I10 Essential (primary) hypertension: Secondary | ICD-10-CM

## 2018-09-25 DIAGNOSIS — I5023 Acute on chronic systolic (congestive) heart failure: Secondary | ICD-10-CM

## 2018-09-25 DIAGNOSIS — I4891 Unspecified atrial fibrillation: Secondary | ICD-10-CM

## 2018-09-25 LAB — CBC
HCT: 44.9 % (ref 36.0–46.0)
Hemoglobin: 14.1 g/dL (ref 12.0–15.0)
MCH: 30.6 pg (ref 26.0–34.0)
MCHC: 31.4 g/dL (ref 30.0–36.0)
MCV: 97.4 fL (ref 80.0–100.0)
NRBC: 0 % (ref 0.0–0.2)
PLATELETS: 186 10*3/uL (ref 150–400)
RBC: 4.61 MIL/uL (ref 3.87–5.11)
RDW: 13.7 % (ref 11.5–15.5)
WBC: 8.7 10*3/uL (ref 4.0–10.5)

## 2018-09-25 LAB — LIPID PANEL
CHOLESTEROL: 89 mg/dL (ref 0–200)
HDL: 31 mg/dL — AB (ref >40)
LDL Cholesterol: 49 mg/dL (ref 0–99)
TRIGLYCERIDES: 47 mg/dL (ref <150)
Total CHOL/HDL Ratio: 2.9 RATIO
VLDL: 9 mg/dL (ref 0–40)

## 2018-09-25 LAB — COMPREHENSIVE METABOLIC PANEL
ALBUMIN: 3.4 g/dL — AB (ref 3.5–5.0)
ALT: 23 U/L (ref 0–44)
AST: 30 U/L (ref 15–41)
Alkaline Phosphatase: 52 U/L (ref 38–126)
Anion gap: 9 (ref 5–15)
BUN: 23 mg/dL (ref 8–23)
CHLORIDE: 104 mmol/L (ref 98–111)
CO2: 25 mmol/L (ref 22–32)
Calcium: 8.2 mg/dL — ABNORMAL LOW (ref 8.9–10.3)
Creatinine, Ser: 1.15 mg/dL — ABNORMAL HIGH (ref 0.44–1.00)
GFR calc non Af Amer: 44 mL/min — ABNORMAL LOW (ref >60)
GFR, EST AFRICAN AMERICAN: 51 mL/min — AB (ref >60)
GLUCOSE: 154 mg/dL — AB (ref 70–99)
Potassium: 3.5 mmol/L (ref 3.5–5.1)
SODIUM: 138 mmol/L (ref 135–145)
Total Bilirubin: 1.1 mg/dL (ref 0.3–1.2)
Total Protein: 6.1 g/dL — ABNORMAL LOW (ref 6.5–8.1)

## 2018-09-25 LAB — TSH: TSH: 4.865 u[IU]/mL — ABNORMAL HIGH (ref 0.350–4.500)

## 2018-09-25 LAB — TROPONIN I
TROPONIN I: 0.03 ng/mL — AB (ref <0.03)
TROPONIN I: 0.03 ng/mL — AB (ref <0.03)
TROPONIN I: 0.04 ng/mL — AB (ref <0.03)

## 2018-09-25 LAB — ECHOCARDIOGRAM COMPLETE
Height: 63 in
WEIGHTICAEL: 2952.4 [oz_av]

## 2018-09-25 MED ORDER — FUROSEMIDE 10 MG/ML IJ SOLN
40.0000 mg | Freq: Every day | INTRAMUSCULAR | Status: DC
  Administered 2018-09-26: 40 mg via INTRAVENOUS
  Filled 2018-09-25: qty 4

## 2018-09-25 MED ORDER — POTASSIUM CHLORIDE CRYS ER 20 MEQ PO TBCR
20.0000 meq | EXTENDED_RELEASE_TABLET | Freq: Once | ORAL | Status: AC
  Administered 2018-09-25: 20 meq via ORAL
  Filled 2018-09-25: qty 1

## 2018-09-25 MED ORDER — METOCLOPRAMIDE HCL 5 MG/ML IJ SOLN
10.0000 mg | Freq: Once | INTRAMUSCULAR | Status: AC
  Administered 2018-09-25: 10 mg via INTRAVENOUS
  Filled 2018-09-25: qty 2

## 2018-09-25 MED ORDER — METOPROLOL TARTRATE 50 MG PO TABS
50.0000 mg | ORAL_TABLET | Freq: Two times a day (BID) | ORAL | Status: DC
  Administered 2018-09-25 – 2018-09-26 (×2): 50 mg via ORAL
  Filled 2018-09-25 (×2): qty 1

## 2018-09-25 NOTE — Progress Notes (Signed)
  Echocardiogram 2D Echocardiogram has been performed.  Erin Novak F 09/25/2018, 9:16 AM

## 2018-09-25 NOTE — Progress Notes (Signed)
.  CRITICAL VALUE ALERT  Critical Value:  Troponin 0.03  Date & Time Notied:  09/25/18 0125  Provider Notified: On-call NP [place]  Orders Received/Actions taken:

## 2018-09-25 NOTE — Progress Notes (Addendum)
PROGRESS NOTE    Erin Novak  KZS:[id number] DOB: [birth year] DOA: 09/24/2018 PCP: [name], MD    Brief Narrative:  77 year old female who presented with dyspnea and weakness.  She does have significant past medical history of for left breast cancer, hypertension, congestive heart failure, morbid obesity, GERD and dyslipidemia.  Reported 7 days of worsening dyspnea and lower extremity edema, her symptoms were refractive to an increased dose of furosemide.  On the initial physical examination blood pressure 116/93, temperature 98, heart rate 150, respiratory 25, oxygen saturation 99% on room air.  Moist mucous membranes, lungs with diffuse bilateral rales, no wheezing, no rhonchi, heart S1-S2 present, irregularly irregular, tachycardic, abdomen soft nontender, no lower extremity edema.  Sodium 142, potassium 2.8, chloride 104, bicarb 24, glucose 108, BUN 24, creatinine 0.97, BNP 2107, white cell count 9.2, hemoglobin 14.2, hematocrit 44.7, platelets 164, chest radiograph with vascular congestion and small bilateral pleural effusions, positive cardiomegaly.  Her EKG was sinus rhythm, positive PVCs, right axis deviation with left bundle branch block.    Patient was admitted to the hospital with working diagnosis of acute on chronic diastolic heart failure exacerbation, complicated by new onset atrial fibrillation.   Assessment & Plan:   Principal Problem:   New onset A-fib [facility]) Active Problems:   Hypothyroidism   Essential hypertension   Asthma   GERD (gastroesophageal reflux disease)   Obesity   Post-operative nausea and vomiting   CHF (congestive heart failure) (HCC)   Hypokalemia   1. Systolic heart failure decompensation, acute on chronic. Will continue aggressive diuresis with furosemide to target negative fluid balance, strict in and out's, continue heart failure management with entresto, and aldactone. Will increase metoprolol to 50 mg po bid. Follow on echocardiography.  2.  Ruled out atrial fibrillation. EKG and telemetry personally reviewed, and case discussed with Dr. Nadyne Coombes (cardiology), tracings sinus with PVC, will continue telemetry monitoring and will discontinue anticoagulation for now. Increase metoprolol to 50 mg po bid.   3. COPD. Stable with no signs of exacerbation, will continue dulera, and oxymetry monitoring.  4. Dyslipidemia. Continue atorvastatin  5. Hx of breast cancer. Continue anastrazole.    6. Hypokalemia. Likely due to diuresis, will correct with Kcl 20 meq and follow on renal panel in am.   7. Obesity. Calculated BMI 32. Will need outpatient follow up.   DVT prophylaxis: enoxaparin   Code Status: full Family Communication: I spoke with patient's family at the bedside and all questions were addressed.  Disposition Plan/ discharge barriers: Pending clinical improvement  Body mass index is 32.69 kg/m. Malnutrition Type:      Malnutrition Characteristics:      Nutrition Interventions:     RN Pressure Injury Documentation:     Consultants:     Procedures:     Antimicrobials:       Subjective: Patient with improved dyspnea, but not yet back to baseline, no nausea or vomiting, no chest pain or palpitations. Edema lower extremities improving, but not back to baseline.   Objective: Vitals:   09/24/18 2209 09/24/18 2352 09/24/18 2352 09/25/18 0613  BP:   (!) 139/92 107/71  Pulse:   (!) 111 93  Resp:   18 18  Temp:   97.8 F (36.6 C) 97.7 F (36.5 C)  TempSrc:   Oral Oral  SpO2:   99% 96%  Weight: 85.4 kg 83.7 kg    Height:  5\' 3"  (1.6 m)      Intake/Output Summary (Last 24 hours)  at 09/25/2018 1202 Last data filed at 09/25/2018 0200 Gross per 24 hour  Intake 170.99 ml  Output -  Net 170.99 ml   Filed Weights   09/24/18 1924 09/24/18 2209 09/24/18 2352  Weight: 86.2 kg 85.4 kg 83.7 kg    Examination:   General: deconditioned  Neurology: Awake and alert, non focal  E ENT: mild pallor, no  icterus, oral mucosa moist Cardiovascular: No JVD. S1-S2 present, rhythmic, no gallops, rubs, or murmurs. Trace lower extremity edema. Pulmonary: positive breath sounds bilaterally, decreased air movement, no wheezing, rhonchi or rales. Gastrointestinal. Abdomen protuberant with no organomegaly, non tender, no rebound or guarding Skin. No rashes Musculoskeletal: no joint deformities     Data Reviewed: I have personally reviewed following labs and imaging studies  CBC: Recent Labs  Lab 09/24/18 2053 09/25/18 0514  WBC 9.2 8.7  HGB 14.2 14.1  HCT 44.7 44.9  MCV 98.0 97.4  PLT 164 443   Basic Metabolic Panel: Recent Labs  Lab 09/24/18 2053 09/24/18 2230 09/25/18 0514  NA 142  --  138  K 2.8*  --  3.5  CL 104  --  104  CO2 24  --  25  GLUCOSE 108*  --  154*  BUN 24*  --  23  CREATININE 0.97  --  1.15*  CALCIUM 8.6*  --  8.2*  MG  --  1.7  --    GFR: Estimated Creatinine Clearance: 41.3 mL/min (A) (by C-G formula based on SCr of 1.15 mg/dL (H)). Liver Function Tests: Recent Labs  Lab 09/24/18 2053 09/25/18 0514  AST 27 30  ALT 23 23  ALKPHOS 54 52  BILITOT 1.3* 1.1  PROT 6.4* 6.1*  ALBUMIN 3.7 3.4*   No results for input(s): LIPASE, AMYLASE in the last 168 hours. No results for input(s): AMMONIA in the last 168 hours. Coagulation Profile: No results for input(s): INR, PROTIME in the last 168 hours. Cardiac Enzymes: Recent Labs  Lab 09/25/18 0018 09/25/18 0514  TROPONINI 0.03* 0.04*   BNP (last 3 results) No results for input(s): PROBNP in the last 8760 hours. HbA1C: No results for input(s): HGBA1C in the last 72 hours. CBG: No results for input(s): GLUCAP in the last 168 hours. Lipid Profile: Recent Labs    09/25/18 0514  CHOL 89  HDL 31*  LDLCALC 49  TRIG 47  CHOLHDL 2.9   Thyroid Function Tests: Recent Labs    09/25/18 0018  TSH 4.865*   Anemia Panel: No results for input(s): VITAMINB12, FOLATE, FERRITIN, TIBC, IRON, RETICCTPCT in  the last 72 hours.    Radiology Studies: I have reviewed all of the imaging during this hospital visit personally     Scheduled Meds: . anastrozole  1 mg Oral Daily  . apixaban  5 mg Oral BID  . atorvastatin  10 mg Oral Daily  . furosemide  40 mg Intravenous BID  . levothyroxine  88 mcg Oral Q0600  . loratadine  10 mg Oral Daily  . metoprolol tartrate  25 mg Oral BID  . mometasone-formoterol  2 puff Inhalation BID  . sacubitril-valsartan  1 tablet Oral BID  . spironolactone  25 mg Oral Daily   Continuous Infusions:   LOS: 1 day        Mauricio Gerome Apley, MD [facility] Pager [phone]

## 2018-09-26 DIAGNOSIS — E039 Hypothyroidism, unspecified: Secondary | ICD-10-CM

## 2018-09-26 LAB — BASIC METABOLIC PANEL
Anion gap: 11 (ref 5–15)
BUN: 28 mg/dL — ABNORMAL HIGH (ref 8–23)
CALCIUM: 8.3 mg/dL — AB (ref 8.9–10.3)
CO2: 23 mmol/L (ref 22–32)
CREATININE: 1.64 mg/dL — AB (ref 0.44–1.00)
Chloride: 104 mmol/L (ref 98–111)
GFR calc Af Amer: 33 mL/min — ABNORMAL LOW (ref >60)
GFR, EST NON AFRICAN AMERICAN: 29 mL/min — AB (ref >60)
Glucose, Bld: 109 mg/dL — ABNORMAL HIGH (ref 70–99)
POTASSIUM: 3.8 mmol/L (ref 3.5–5.1)
SODIUM: 138 mmol/L (ref 135–145)

## 2018-09-26 MED ORDER — PANTOPRAZOLE SODIUM 40 MG PO TBEC
40.0000 mg | DELAYED_RELEASE_TABLET | Freq: Every day | ORAL | Status: DC
  Administered 2018-09-27 – 2018-09-28 (×2): 40 mg via ORAL
  Filled 2018-09-26 (×2): qty 1

## 2018-09-26 MED ORDER — METOPROLOL TARTRATE 25 MG PO TABS
25.0000 mg | ORAL_TABLET | Freq: Two times a day (BID) | ORAL | Status: DC
  Administered 2018-09-26: 25 mg via ORAL
  Filled 2018-09-26: qty 1

## 2018-09-26 NOTE — Consult Note (Addendum)
CARDIOLOGY CONSULT NOTE  Patient ID: Erin Novak MRN: [id number] DOB/AGE: 1941-06-23 77 y.o.  Admit date: 09/24/2018 Referring Physician  Sander Radon, MD Primary Physician:  [name], MD Reason for Consultation  CHF  HPI: Erin Novak  is a 77 y.o. 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  Her past medical history significant for hypertension, hyperlipidemia, vitamin D deficiency, bronchial asthma.  She is a non-smoker.  There is no history of alcohol abuse.  She does have a history of breast cancer status post radiation therapy in 2018.  Since admission to the hospital, she has diuresed well and her leg edema has essentially resolved, dyspnea has improved remarkably well.  She complains of mild nausea this morning.  Past Medical History:  Diagnosis Date  . Abdominal pain   . Allergy   . Asthma   . Bronchitis   . Cholelithiasis 06-20-2011   [place]   . Diarrhea   . GERD (gastroesophageal reflux disease)   . Hyperlipidemia   . Hypertension   . Hypothyroidism   . Migraines   . Personal history of radiation therapy 2018  . PONV (postoperative nausea and vomiting)   . Thyroid disease    hypothyroid  . Ulcer      Past Surgical History:  Procedure Laterality Date  . ABDOMINAL HYSTERECTOMY    . BREAST LUMPECTOMY Left 03/03/2017  . BREAST LUMPECTOMY WITH RADIOACTIVE SEED AND SENTINEL LYMPH NODE BIOPSY Left 03/03/2017   Procedure: LEFT BREAST LUMPECTOMY WITH RADIOACTIVE SEED X2 AND SENTINEL LYMPH NODE BIOPSY;  Surgeon: Fanny Skates, MD;  Location: [place];  Service: General;  Laterality: Left;  . CHOLECYSTECTOMY  12/25/2011   Procedure: LAPAROSCOPIC CHOLECYSTECTOMY WITH INTRAOPERATIVE CHOLANGIOGRAM;  Surgeon: Earnstine Regal, MD;   Location: [facility];  Service: General;  Laterality: N/A;  laparoscopic cholecystectomy with intraoperative cholangiogram  . COLONOSCOPY  01/10/2003   internal hemorrhoids (Dr. Lajoyce Corners)  . FOOT SURGERY     right  . hysterectomy    . ruptured disk     in neck  . TONSILLECTOMY    . UPPER GASTROINTESTINAL ENDOSCOPY  01/10/2003   hiatal hernia (Dr. Lajoyce Corners)     Family History  Problem Relation Age of Onset  . Asthma Father   . Colon cancer Neg Hx   . Stomach cancer Neg Hx   . Esophageal cancer Neg Hx   . Rectal cancer Neg Hx      Social History: Social History   Socioeconomic History  . Marital status: Married    Spouse name: Not on file  . Number of children: 2  . Years of education: Not on file  . Highest education level: Not on file  Occupational History  . Occupation: Housewife  Social Needs  . Financial resource strain: Not on file  . Food insecurity:    Worry: Not on file    Inability: Not on file  . Transportation needs:    Medical: Not on file    Non-medical: Not on file  Tobacco Use  . Smoking status: Never Smoker  . Smokeless tobacco: Never Used  Substance and Sexual Activity  . Alcohol use: Yes    Comment: rare  . Drug use: No  . Sexual activity: Never  Lifestyle  . Physical activity:    Days per week:  Not on file    Minutes per session: Not on file  . Stress: Not on file  Relationships  . Social connections:    Talks on phone: Not on file    Gets together: Not on file    Attends religious service: Not on file    Active member of club or organization: Not on file    Attends meetings of clubs or organizations: Not on file    Relationship status: Not on file  . Intimate partner violence:    Fear of current or ex partner: Not on file    Emotionally abused: Not on file    Physically abused: Not on file    Forced sexual activity: Not on file  Other Topics Concern  . Not on file  Social History Narrative  . Not on file     Medications Prior to Admission   Medication Sig Dispense Refill Last Dose  . acetaminophen (TYLENOL) 500 MG tablet Take 500 mg by mouth every 6 (six) hours as needed for moderate pain.   unknown  . ALBUTEROL SULFATE HFA IN Inhale 2 puffs into the lungs every 4 (four) hours as needed (sob and wheezing).    09/24/2018 at Unknown time  . anastrozole (ARIMIDEX) 1 MG tablet TAKE 1 TABLET BY MOUTH ONCE DAILY 90 tablet 0 09/24/2018 at Unknown time  . atorvastatin (LIPITOR) 10 MG tablet Take 10 mg by mouth daily.    09/24/2018 at Unknown time  . budesonide-formoterol (SYMBICORT) 160-4.5 MCG/ACT inhaler Inhale 2 puffs into the lungs 2 (two) times daily as needed (sob and wheezing). For shortness of breath   Past Week at Unknown time  . furosemide (LASIX) 40 MG tablet Take 40 mg by mouth daily as needed for fluid or edema.   unknown  . ibuprofen (ADVIL,MOTRIN) 200 MG tablet Take 400 mg by mouth every 6 (six) hours as needed. For pain    09/23/2018 at Unknown time  . levothyroxine (SYNTHROID, LEVOTHROID) 88 MCG tablet Take 88 mcg by mouth every morning.    09/24/2018 at Unknown time  . loratadine (CLARITIN) 10 MG tablet Take 10 mg by mouth daily.   09/24/2018 at Unknown time  . sacubitril-valsartan (ENTRESTO) 97-103 MG Take 1 tablet by mouth 2 (two) times daily.   09/24/2018 at Unknown time  . spironolactone (ALDACTONE) 25 MG tablet Take 25 mg by mouth daily.   09/24/2018 at Unknown time  . anastrozole (ARIMIDEX) 1 MG tablet TAKE 1 TABLET BY MOUTH ONCE DAILY (Patient not taking: Reported on 09/24/2018) 90 tablet 0 Not Taking at Unknown time  . mometasone-formoterol (DULERA) 100-5 MCG/ACT AERO Inhale 2 puffs into the lungs daily as needed for wheezing.   unknown   Review of Systems  Constitutional: Positive for malaise/fatigue.  HENT: Negative for congestion and tinnitus.   Eyes: Negative.   Respiratory: Positive for shortness of breath and wheezing. Negative for cough, hemoptysis and sputum production.   Cardiovascular: Positive for orthopnea,  leg swelling and PND. Negative for chest pain, palpitations and claudication.  Gastrointestinal: Positive for nausea.  Genitourinary: Negative.   Musculoskeletal: Negative.   Neurological: Negative.   Psychiatric/Behavioral: Negative.   All other systems reviewed and are negative.   Physical Exam:  Blood pressure 97/73, pulse 73, temperature 97.9 F (36.6 C), temperature source Oral, resp. rate 20, height _0  (1.6 m), weight 83.7 kg, SpO2 96 %. Body mass index is 32.69 kg/m.  Physical Exam  Constitutional: She is oriented to person, place, and time. She  appears well-nourished. No distress.  HENT:  Head: Atraumatic.  Neck: JVD present.  Cardiovascular: Regular rhythm. Exam reveals distant heart sounds and decreased pulses.  No murmur heard. Pulses:      Carotid pulses are 3+ on the right side, and 3+ on the left side. Pulmonary/Chest: Effort normal. No respiratory distress. She has rales (bilateral basal).  Abdominal: Soft. Bowel sounds are normal. There is no tenderness. There is no guarding.  obese  Musculoskeletal: Normal range of motion. She exhibits no edema.  Neurological: She is alert and oriented to person, place, and time.  Skin: Skin is warm and dry.  Psychiatric: She has a normal mood and affect.    Labs:   Lab Results  Component Value Date   WBC 8.7 09/25/2018   HGB 14.1 09/25/2018   HCT 44.9 09/25/2018   MCV 97.4 09/25/2018   PLT 186 09/25/2018    Recent Labs  Lab 09/25/18 0514 09/26/18 0439  NA 138 138  K 3.5 3.8  CL 104 104  CO2 25 23  BUN 23 28*  CREATININE 1.15* 1.64*  CALCIUM 8.2* 8.3*  PROT 6.1*  --   BILITOT 1.1  --   ALKPHOS 52  --   ALT 23  --   AST 30  --   GLUCOSE 154* 109*    Lipid Panel     Component Value Date/Time   CHOL 89 09/25/2018 0514   TRIG 47 09/25/2018 0514   HDL 31 (L) 09/25/2018 0514   CHOLHDL 2.9 09/25/2018 0514   VLDL 9 09/25/2018 0514   LDLCALC 49 09/25/2018 0514    BNP (last 3 results) Recent Labs     09/24/18 2053  BNP 2,107.8*   Cardiac Panel (last 3 results) Recent Labs    09/25/18 0018 09/25/18 0514 09/25/18 1227  TROPONINI 0.03* 0.04* 0.03*    TSH Recent Labs    09/25/18 0018  TSH 4.865*  Radiology: Dg Chest 2 View  Result Date: 09/24/2018 CLINICAL DATA:  Shortness of breath.  Increased fatigue. EXAM: CHEST - 2 VIEW COMPARISON:  Two-view chest x-ray 12/23/2011. FINDINGS: There is marked interval enlargement of the heart. Mild pulmonary vascular congestion is present without frank edema. Small effusions are present. Progressive endplate degenerative changes are present in the thoracic spine. Vertebral body heights are maintained. IMPRESSION: 1. Progressive cardiomegaly and pulmonary vascular congestion consistent with early congestive heart failure. 2. Small bilateral pleural effusions. Electronically Signed   By: San Morelle M.D.   On: 09/24/2018 20:23    Scheduled Meds: . anastrozole  1 mg Oral Daily  . atorvastatin  10 mg Oral Daily  . levothyroxine  88 mcg Oral Q0600  . loratadine  10 mg Oral Daily  . metoprolol tartrate  50 mg Oral BID  . mometasone-formoterol  2 puff Inhalation BID  . sacubitril-valsartan  1 tablet Oral BID  . spironolactone  25 mg Oral Daily   Continuous Infusions: PRN Meds:.acetaminophen, ondansetron (ZOFRAN) IV  CARDIAC STUDIES:  EKG 09/26/2018: Sinus rhythm with first-degree AV block at rate of 86 bpm, left bundle branch block.  PACs.  Echocardiogram 09/26/2018: Severe LV systolic dysfunction, EF 20 to 25%.  No change from echocardiogram on 05/13/2018 done in our office.  Grade 2 diastolic dysfunction.  Severe posteriorly directed mitral regurgitation.  Mild RV dilatation, mild to moderate pulmonary hypertension.  Lexiscan Myoview stress test 05/31/2018: Nondiagnostic EKG, mildly decreased tracer uptake in the basal inferoseptal and mid inferoseptal and apical inferior segments with minimal reversibility, findings most  consistent  with nonischemic dilated cardiomyopathy with EF 9%.  ASSESSMENT AND PLAN:  1.  Acute on chronic systolic and diastolic heart failure with severe LV systolic dysfunction, nonischemic dilated cardia myopathy. 2.  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 eGFR from 55 to 29m. due to aggressive diuresis. 3.  Hypertension 4.  Vitamin D deficiency 5.  Hyperlipidemia 6.  Nausea.  Recommendation: Would hold off on Entresto and also Aldactone for now, continue diuresis (got IV dose lasix this morning), still has significant fluid overload state.    Would recommend adding Entresto in the outpatient basis once renal function is stable.  Blood pressure is borderline.  We will reduce the dose of beta-blocker therapy as well to improve perfusion to the kidneys.  Check vitamin D levels and supplement as necessary.  Continue Lipitor.  Unfortunately patient is noncompliant with diet and fluid restriction.  It is interesting to note that she has posteriorly directed MR with mild restriction of posterior mitral leaflet, and nuclear stress test that revealed very minimal ischemia out of proportion to severe LV systolic dysfunction.  However she may need coronary angiography when medically stable which can be done in the outpatient basis.  She would also be a good candidate for ICD implantation, BiV ICD if she does show compliance.  Switch to p.o. Lasix 40 mg tomorrow and can be discharged home if remained stable by Tuesday if renal function improves. Add PPI for nausea. I have ordered Vit D level. I had a long discussion with family, 2 of her daughters present, regarding weighing herself on a daily basis, avoidance of salty food, being compliant with dietary restriction.  JAdrian Prows MD 09/26/2018, 1:31 PM PL[facility]headCardiovascular. PHoliday LakePager: [phone] Office: [phone] no answer Cell [phone]

## 2018-09-26 NOTE — Progress Notes (Addendum)
PROGRESS NOTE    Erin Novak  ERD:[id number] DOB: 11-09-1941 DOA: 09/24/2018 PCP: [name], MD    Brief Narrative:  77 year old female who presented with dyspnea and weakness.  She does have significant past medical history of for left breast cancer, hypertension, congestive heart failure, morbid obesity, GERD and dyslipidemia.  Reported 7 days of worsening dyspnea and lower extremity edema, her symptoms were refractive to an increased dose of furosemide.  On the initial physical examination blood pressure 116/93, temperature 98, heart rate 150, respiratory 25, oxygen saturation 99% on room air.  Moist mucous membranes, lungs with diffuse bilateral rales, no wheezing, no rhonchi, heart S1-S2 present, irregularly irregular, tachycardic, abdomen soft nontender, no lower extremity edema.  Sodium 142, potassium 2.8, chloride 104, bicarb 24, glucose 108, BUN 24, creatinine 0.97, BNP 2107, white cell count 9.2, hemoglobin 14.2, hematocrit 44.7, platelets 164, chest radiograph with vascular congestion and small bilateral pleural effusions, positive cardiomegaly.  Her EKG was sinus rhythm, positive PVCs, right axis deviation with left bundle branch block.    Patient was admitted to the hospital with working diagnosis of acute on chronic diastolic heart failure exacerbation, complicated by new onset atrial fibrillation.    Assessment & Plan:   Active Problems:   Hypothyroidism   Essential hypertension   Asthma   GERD (gastroesophageal reflux disease)   Obesity   Post-operative nausea and vomiting   CHF (congestive heart failure) (HCC)   Hypokalemia  1. Systolic heart failure decompensation, acute on chronic. Improved volume status, will continue diuresis as tolerated, will need strict in and out, will hold on entresto and aldactone for now due to worsening GFR. Continue telemetry monitoring, metoprolol continue bid 25 mg  2. Ruled out atrial fibrillation. Continue telemetry  monitoring.  3. COPD. No clinical signs of exacerbation, on dulera, and oxymetry monitoring.  4. Dyslipidemia. On atorvastatin  5. Hx of breast cancer. ON anastrazole.    6. Hypokalemia. Serum K at 3,8 today, will continue to follow renal panel in am, serum cr worsening at 1,64, will avoid hypotension and nephrotoxic medications.   7. Obesity. Calculated BMI 32. Nutrition consult.   DVT prophylaxis: enoxaparin   Code Status: full Family Communication: I spoke with patient's family at the bedside and all questions were addressed.  Disposition Plan/ discharge barriers: Pending clinical improvement   Body mass index is 32.69 kg/m. Malnutrition Type:      Malnutrition Characteristics:      Nutrition Interventions:     RN Pressure Injury Documentation:     Consultants:   Cardiology   Procedures:     Antimicrobials:       Subjective: Patient is feeling better, dyspnea and edema have improved but not back to baseline, no chest pain, no palpitations, no nausea or vomiting.   Objective: Vitals:   09/25/18 2030 09/25/18 2108 09/26/18 0623 09/26/18 0922  BP:  118/85 107/84 122/74  Pulse:  89 93 84  Resp:  20 20   Temp:  98.2 F (36.8 C) 97.9 F (36.6 C)   TempSrc:  Oral Oral   SpO2: 96% 95% 91%   Weight:      Height:       No intake or output data in the 24 hours ending 09/26/18 1113 Filed Weights   09/24/18 1924 09/24/18 2209 09/24/18 2352  Weight: 86.2 kg 85.4 kg 83.7 kg    Examination:   General: deconditioned  Neurology: Awake and alert, non focal  E ENT: mild pallor, no icterus, oral  mucosa moist Cardiovascular: No JVD. S1-S2 present, rhythmic, no gallops, rubs, or murmurs. No lower extremity edema. Pulmonary: positive breath sounds bilaterally, decreased air movement, no wheezing, or rhonchi, scattered rales. Gastrointestinal. Abdomen with no organomegaly, non tender, no rebound or guarding Skin. No rashes Musculoskeletal: no joint  deformities     Data Reviewed: I have personally reviewed following labs and imaging studies  CBC: Recent Labs  Lab 09/24/18 2053 09/25/18 0514  WBC 9.2 8.7  HGB 14.2 14.1  HCT 44.7 44.9  MCV 98.0 97.4  PLT 164 786   Basic Metabolic Panel: Recent Labs  Lab 09/24/18 2053 09/24/18 2230 09/25/18 0514 09/26/18 0439  NA 142  --  138 138  K 2.8*  --  3.5 3.8  CL 104  --  104 104  CO2 24  --  25 23  GLUCOSE 108*  --  154* 109*  BUN 24*  --  23 28*  CREATININE 0.97  --  1.15* 1.64*  CALCIUM 8.6*  --  8.2* 8.3*  MG  --  1.7  --   --    GFR: Estimated Creatinine Clearance: 29 mL/min (A) (by C-G formula based on SCr of 1.64 mg/dL (H)). Liver Function Tests: Recent Labs  Lab 09/24/18 2053 09/25/18 0514  AST 27 30  ALT 23 23  ALKPHOS 54 52  BILITOT 1.3* 1.1  PROT 6.4* 6.1*  ALBUMIN 3.7 3.4*   No results for input(s): LIPASE, AMYLASE in the last 168 hours. No results for input(s): AMMONIA in the last 168 hours. Coagulation Profile: No results for input(s): INR, PROTIME in the last 168 hours. Cardiac Enzymes: Recent Labs  Lab 09/25/18 0018 09/25/18 0514 09/25/18 1227  TROPONINI 0.03* 0.04* 0.03*   BNP (last 3 results) No results for input(s): PROBNP in the last 8760 hours. HbA1C: No results for input(s): HGBA1C in the last 72 hours. CBG: No results for input(s): GLUCAP in the last 168 hours. Lipid Profile: Recent Labs    09/25/18 0514  CHOL 89  HDL 31*  LDLCALC 49  TRIG 47  CHOLHDL 2.9   Thyroid Function Tests: Recent Labs    09/25/18 0018  TSH 4.865*   Anemia Panel: No results for input(s): VITAMINB12, FOLATE, FERRITIN, TIBC, IRON, RETICCTPCT in the last 72 hours.    Radiology Studies: I have reviewed all of the imaging during this hospital visit personally     Scheduled Meds: . anastrozole  1 mg Oral Daily  . atorvastatin  10 mg Oral Daily  . levothyroxine  88 mcg Oral Q0600  . loratadine  10 mg Oral Daily  . metoprolol tartrate   50 mg Oral BID  . mometasone-formoterol  2 puff Inhalation BID  . sacubitril-valsartan  1 tablet Oral BID  . spironolactone  25 mg Oral Daily   Continuous Infusions:   LOS: 2 days        Mauricio Gerome Apley, MD [facility] Pager [phone]

## 2018-09-27 ENCOUNTER — Encounter (HOSPITAL_COMMUNITY): Payer: Self-pay

## 2018-09-27 DIAGNOSIS — I509 Heart failure, unspecified: Secondary | ICD-10-CM

## 2018-09-27 DIAGNOSIS — N179 Acute kidney failure, unspecified: Secondary | ICD-10-CM

## 2018-09-27 DIAGNOSIS — K219 Gastro-esophageal reflux disease without esophagitis: Secondary | ICD-10-CM

## 2018-09-27 LAB — BASIC METABOLIC PANEL
ANION GAP: 13 (ref 5–15)
BUN: 34 mg/dL — ABNORMAL HIGH (ref 8–23)
CALCIUM: 8.3 mg/dL — AB (ref 8.9–10.3)
CHLORIDE: 103 mmol/L (ref 98–111)
CO2: 23 mmol/L (ref 22–32)
CREATININE: 2.34 mg/dL — AB (ref 0.44–1.00)
GFR, EST AFRICAN AMERICAN: 22 mL/min — AB (ref >60)
GFR, EST NON AFRICAN AMERICAN: 19 mL/min — AB (ref >60)
GLUCOSE: 103 mg/dL — AB (ref 70–99)
POTASSIUM: 3.3 mmol/L — AB (ref 3.5–5.1)
Sodium: 139 mmol/L (ref 135–145)

## 2018-09-27 LAB — BRAIN NATRIURETIC PEPTIDE: B Natriuretic Peptide: 1472 pg/mL — ABNORMAL HIGH (ref 0.0–100.0)

## 2018-09-27 MED ORDER — HEPARIN SODIUM (PORCINE) 5000 UNIT/ML IJ SOLN
5000.0000 [IU] | Freq: Three times a day (TID) | INTRAMUSCULAR | Status: DC
  Administered 2018-09-27 – 2018-09-28 (×3): 5000 [IU] via SUBCUTANEOUS
  Filled 2018-09-27 (×3): qty 1

## 2018-09-27 MED ORDER — METOPROLOL SUCCINATE ER 25 MG PO TB24
25.0000 mg | ORAL_TABLET | Freq: Every day | ORAL | Status: DC
  Administered 2018-09-27 – 2018-09-28 (×2): 25 mg via ORAL
  Filled 2018-09-27 (×2): qty 1

## 2018-09-27 NOTE — Progress Notes (Signed)
PROGRESS NOTE    Erin Novak  KWI:[id number] DOB: 1941/10/12 DOA: 09/24/2018 PCP: [name], MD    Brief Narrative:  77 year old female who presented with dyspnea and weakness. She does have significant past medical history of for left breast cancer, hypertension, congestive heart failure, morbid obesity, GERD and dyslipidemia. Reported 7 days of worsening dyspnea and lower extremity edema, her symptoms were refractive to an increased dose of furosemide. On the initial physical examination blood pressure 116/93, temperature 98, heart rate 150, respiratory 25, oxygen saturation 99% on room air. Moist mucous membranes, lungs with diffuse bilateral rales, no wheezing, no rhonchi, heart S1-S2 present, irregularly irregular, tachycardic, abdomen soft nontender, no lower extremity edema. Sodium 142, potassium 2.8, chloride 104, bicarb 24, glucose 108, BUN 24, creatinine 0.97, BNP 2107,white cell count 9.2, hemoglobin 14.2, hematocrit 44.7, platelets 164,chest radiograph with vascular congestion and small bilateral pleural effusions, positive cardiomegaly.Her EKG was sinus rhythm, positive PVCs, right axis deviation with left bundle branch block.  Patient was admitted to the hospital with working diagnosis of acute on chronic diastolic heart failure exacerbation, complicated by new onset atrial fibrillation.   Assessment & Plan:   Active Problems:   Hypothyroidism   Essential hypertension   Asthma   GERD (gastroesophageal reflux disease)   Obesity   Post-operative nausea and vomiting   CHF (congestive heart failure) (HCC)   Hypokalemia   1. Systolic heart failure decompensation, acute on chronic. Today more euvolemic, will continue to hold on furosemide, entresto and aldactone, continue b blocker with metoprolol 25 mg succinate. Heart failure teaching, nutrition consult. Will have patient out of bed tid with meals and consult physical therapy for ambulation.   2. Ruled out  atrial fibrillation. Improved rate control, patient has remained on sinus rhythm will continue b blocker, and telemetry monitoring.  3. COPD. Currently with no clinical signs of exacerbation. Continue with dulera, and oxymetry monitoring.  4. Dyslipidemia. Continue atorvastatin  5. Hx of breast cancer. Continue with anastrazole.   6. AKI with Hypokalemia. Worsening renal function, serum cr today up to 2,34, with K at 3,3 and serum bicarbonate at 23. Will monitor urine output today, will avoid hypotension and nephrotoxic medications, follow on renal panel in am. As part of the work up for renal failure will check renal US. If worsening renal function and unable to measure urine output may need foley catheter.    7. Obesity. Calculated BMI 32. Follow with nutrition consult.   DVT prophylaxis:enoxaparin Code Status:full Family Communication:I spoke with patient's family at the bedside and all questions were addressed.  Disposition Plan/ discharge barriers:Pending clinical improvement  Body mass index is 32.69 kg/m. Malnutrition Type:      Malnutrition Characteristics:      Nutrition Interventions:     RN Pressure Injury Documentation:     Consultants:   Cardiology   Procedures:     Antimicrobials:       Subjective: Patient is feeling better this am but not back to her baseline, no nausea or vomiting, dyspnea and lower extremity edema have being improving, no chest pain.   Objective: Vitals:   09/26/18 1259 09/26/18 2046 09/26/18 2057 09/27/18 0545  BP: 97/73  114/76 122/77  Pulse: 73  81 81  Resp: 20  18 18   Temp:   97.7 F (36.5 C) 98.1 F (36.7 C)  TempSrc:   Oral Oral  SpO2: 96% 97% 95% 93%  Weight:      Height:  Intake/Output Summary (Last 24 hours) at 09/27/2018 0940 Last data filed at 09/27/2018 0200 Gross per 24 hour  Intake 0 ml  Output -  Net 0 ml   Filed Weights   09/24/18 1924 09/24/18 2209 09/24/18 2352    Weight: 86.2 kg 85.4 kg 83.7 kg    Examination:   General: deconditioned  Neurology: Awake and alert, non focal  E ENT: mild pallor, no icterus, oral mucosa moist Cardiovascular: No JVD. S1-S2 present, rhythmic, no gallops, rubs, or murmurs. Trace lower extremity edema. Pulmonary: positive breath sounds bilaterally, adequate air movement, no wheezing, rhonchi or rales. Gastrointestinal. Abdomen protuberant with no organomegaly, non tender, no rebound or guarding Skin. No rashes Musculoskeletal: no joint deformities     Data Reviewed: I have personally reviewed following labs and imaging studies  CBC: Recent Labs  Lab 09/24/18 2053 09/25/18 0514  WBC 9.2 8.7  HGB 14.2 14.1  HCT 44.7 44.9  MCV 98.0 97.4  PLT 164 956   Basic Metabolic Panel: Recent Labs  Lab 09/24/18 2053 09/24/18 2230 09/25/18 0514 09/26/18 0439 09/27/18 0454  NA 142  --  138 138 139  K 2.8*  --  3.5 3.8 3.3*  CL 104  --  104 104 103  CO2 24  --  25 23 23   GLUCOSE 108*  --  154* 109* 103*  BUN 24*  --  23 28* 34*  CREATININE 0.97  --  1.15* 1.64* 2.34*  CALCIUM 8.6*  --  8.2* 8.3* 8.3*  MG  --  1.7  --   --   --    GFR: Estimated Creatinine Clearance: 20.3 mL/min (A) (by C-G formula based on SCr of 2.34 mg/dL (H)). Liver Function Tests: Recent Labs  Lab 09/24/18 2053 09/25/18 0514  AST 27 30  ALT 23 23  ALKPHOS 54 52  BILITOT 1.3* 1.1  PROT 6.4* 6.1*  ALBUMIN 3.7 3.4*   No results for input(s): LIPASE, AMYLASE in the last 168 hours. No results for input(s): AMMONIA in the last 168 hours. Coagulation Profile: No results for input(s): INR, PROTIME in the last 168 hours. Cardiac Enzymes: Recent Labs  Lab 09/25/18 0018 09/25/18 0514 09/25/18 1227  TROPONINI 0.03* 0.04* 0.03*   BNP (last 3 results) No results for input(s): PROBNP in the last 8760 hours. HbA1C: No results for input(s): HGBA1C in the last 72 hours. CBG: No results for input(s): GLUCAP in the last 168  hours. Lipid Profile: Recent Labs    09/25/18 0514  CHOL 89  HDL 31*  LDLCALC 49  TRIG 47  CHOLHDL 2.9   Thyroid Function Tests: Recent Labs    09/25/18 0018  TSH 4.865*   Anemia Panel: No results for input(s): VITAMINB12, FOLATE, FERRITIN, TIBC, IRON, RETICCTPCT in the last 72 hours.    Radiology Studies: I have reviewed all of the imaging during this hospital visit personally     Scheduled Meds: . anastrozole  1 mg Oral Daily  . atorvastatin  10 mg Oral Daily  . heparin injection (subcutaneous)  5,000 Units Subcutaneous Q8H  . levothyroxine  88 mcg Oral Q0600  . loratadine  10 mg Oral Daily  . metoprolol succinate  25 mg Oral Daily  . mometasone-formoterol  2 puff Inhalation BID  . pantoprazole  40 mg Oral Q0600   Continuous Infusions:   LOS: 3 days         Gerome Apley, MD [facility] Pager [phone]

## 2018-09-27 NOTE — Progress Notes (Addendum)
Subjective:  Breathing much improved. Denies any complaints today. Wants to go home.  Objective:  Vital Signs in the last 24 hours: Temp:  [97.7 F (36.5 C)-98.1 F (36.7 C)] 98.1 F (36.7 C) (11/11 0545) Pulse Rate:  [73-84] 81 (11/11 0545) Resp:  [18-20] 18 (11/11 0545) BP: (97-122)/(73-77) 122/77 (11/11 0545) SpO2:  [93 %-97 %] 93 % (11/11 0545)  Intake/Output from previous day: No intake/output data recorded. Intake/Output from this shift: No intake/output data recorded.  Physical Exam: Constitutional: She is oriented to person, place, and time. She appears well-nourished. No distress.  HENT:  Head: Atraumatic.  Neck: No appreciable JVDt.  Cardiovascular: Regular rhythm. Exam reveals normal heart sounds. No appreciable murmur.  Pulses:      Carotid pulses are 3+ on the right side, and 3+ on the left side. Pulmonary/Chest: Effort normal. No respiratory distress. She has no rales.  Abdominal: Soft. Bowel sounds are normal. There is no tenderness. There is no guarding.  obese  Musculoskeletal: Normal range of motion. She exhibits no edema.  Neurological: She is alert and oriented to person, place, and time.  Skin: Skin is warm and dry.  Psychiatric: She has a normal mood and affect  Lab Results: Recent Labs    09/24/18 2053 09/25/18 0514  WBC 9.2 8.7  HGB 14.2 14.1  PLT 164 186   Recent Labs    09/26/18 0439 09/27/18 0454  NA 138 139  K 3.8 3.3*  CL 104 103  CO2 23 23  GLUCOSE 109* 103*  BUN 28* 34*  CREATININE 1.64* 2.34*   Recent Labs    09/25/18 0514 09/25/18 1227  TROPONINI 0.04* 0.03*   Hepatic Function Panel Recent Labs    09/24/18 2053 09/25/18 0514  PROT 6.4* 6.1*  ALBUMIN 3.7 3.4*  AST 27 30  ALT 23 23  ALKPHOS 54 52  BILITOT 1.3* 1.1  BILIDIR 0.3*  --   IBILI 1.0*  --    Recent Labs    09/25/18 0514  CHOL 89    CARDIAC STUDIES:  EKG 09/26/2018: Sinus rhythm with first-degree AV block at rate of 86 bpm, left bundle branch  block.  PACs.  Echocardiogram 09/26/2018: Severe LV systolic dysfunction, EF 20 to 25%.  No change from echocardiogram on 05/13/2018 done in our office.  Grade 2 diastolic dysfunction.  Severe posteriorly directed mitral regurgitation.  Mild RV dilatation, mild to moderate pulmonary hypertension.  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  Assessment/Recommendations:  77 y/o Caucasian female w/NICM EF 20%, hypertension, hyperlipidemia, h/o breast cancer s/p radiation therapy 2018, bronchial asthma  Acute on chronic systolic heart failure: EF 20%. BNP decreased from 2100 to 1400. No intake and output recorded makes management extremely difficult. Currently on metoprolol tartarate 25 mg bid. Entresto, spironolactone, lasix IV 40 mg daily has been discontinued in light of AKI/CKD. She appears fairly euvolumic today. Agree with holding lasix. Liberalize oral fluid intake. Switch metoprolol tartarate 25 mg bid to metoprolol succinate 25 mg daily.  AKI/CKD: Cr up from 0.97 on 11/08 to 2.34 today. No intake and output recorded makes management extremely difficult. Agree with holding lasix.   Hypertension: Contolled  Hyperlipidemia: Continue lipitor.  Will arrange outpatient follow up.   LOS: 3 days    Manish J Patwardhan 09/27/2018, 8:36 AM  Nigel Mormon, MD [facility]  [facility] [place] Pager: [phone] Office: [phone] If no answer Cell [phone]

## 2018-09-28 ENCOUNTER — Other Ambulatory Visit: Payer: Self-pay | Admitting: Oncology

## 2018-09-28 DIAGNOSIS — E6609 Other obesity due to excess calories: Secondary | ICD-10-CM

## 2018-09-28 DIAGNOSIS — Z6832 Body mass index (BMI) 32.0-32.9, adult: Secondary | ICD-10-CM

## 2018-09-28 LAB — BASIC METABOLIC PANEL
Anion gap: 10 (ref 5–15)
BUN: 37 mg/dL — AB (ref 8–23)
CALCIUM: 8.4 mg/dL — AB (ref 8.9–10.3)
CO2: 25 mmol/L (ref 22–32)
Chloride: 105 mmol/L (ref 98–111)
Creatinine, Ser: 1.73 mg/dL — ABNORMAL HIGH (ref 0.44–1.00)
GFR calc Af Amer: 31 mL/min — ABNORMAL LOW (ref >60)
GFR, EST NON AFRICAN AMERICAN: 27 mL/min — AB (ref >60)
GLUCOSE: 110 mg/dL — AB (ref 70–99)
POTASSIUM: 3.3 mmol/L — AB (ref 3.5–5.1)
SODIUM: 140 mmol/L (ref 135–145)

## 2018-09-28 LAB — CALCITRIOL (1,25 DI-OH VIT D): VIT D 1 25 DIHYDROXY: 49.1 pg/mL (ref 19.9–79.3)

## 2018-09-28 MED ORDER — FUROSEMIDE 40 MG PO TABS: 40.0000 mg | ORAL_TABLET | Freq: Every day | ORAL | 0 refills | Status: DC

## 2018-09-28 MED ORDER — METOPROLOL SUCCINATE ER 25 MG PO TB24: 25.0000 mg | ORAL_TABLET | Freq: Every day | ORAL | 0 refills | Status: DC

## 2018-09-28 NOTE — Discharge Summary (Addendum)
Physician Discharge Summary  Erin Novak FIE:[id number] DOB: November 02, 1941 DOA: 09/24/2018  PCP: [name], MD  Admit date: 09/24/2018 Discharge date: 09/28/2018  Admitted From: Home  Disposition:  Home   Recommendations for Outpatient Follow-up and new medication changes:  1. Follow up with Dr. Maudie Mercury in 7 days 2. Follow up with Dr Einar Gip in 2 weeks 3. Holding aldactone and entresto due to reduced GFR. 4. Furosemide change to daily 40 mg 5. Check renal panel in 7 days.  6. Metoprolol changed to 25 mg (succinate) daily.   Home Health: No   Equipment/Devices: no    Discharge Condition: stable  CODE STATUS: full  Diet recommendation: heart healthy  Brief/Interim Summary: 77 year old female who presented with dyspnea and weakness. She does have significant past medical history of for left breast cancer, hypertension, congestive heart failure, morbid obesity, GERD and dyslipidemia. Reported 7 days of worsening dyspnea and lower extremity edema, her symptoms were refractive to an increased dose of furosemide. On the initial physical examination blood pressure 116/93, temperature 98, heart rate 150, respiratory rate 25, oxygen saturation 99% on room air. Moist mucous membranes, lungs with diffuse bilateral rales, no wheezing, no rhonchi, heart S1-S2 present, irregular, tachycardic, abdomen soft nontender, no lower extremity edema. Sodium 142, potassium 2.8, chloride 104, bicarb 24, glucose 108, BUN 24, creatinine 0.97, BNP 2107,white cell count 9.2, hemoglobin 14.2, hematocrit 44.7, platelets 164,chest radiograph with vascular congestion and small bilateral pleural effusions, positive cardiomegaly.Her EKG was sinus rhythm, positive PVCs, right axis deviation with left bundle branch block.  Patient was admitted to the hospital with working diagnosis of acute on chronic systolic heart failure exacerbation, complicated by new onset atrial fibrillation.  1.  Systolic heart failure  decompensation, acute on chronic, present on admission.  Patient was admitted to the medical ward, she received IV furosemide for diuresis, negative fluid balance was achieved with improvement of her symptoms.  Spironolactone and Entresto were held due to worsening kidney function and risk of hypotension.  Further work-up with echocardiography showed left ventricle ejection fraction 20 to 25% with diffuse hypokinesis.  Metoprolol was changed to long-acting formulation, succinate 25 mg daily.  Patient was counseled about heart failure diet, including salt restricted diet, nutritionist was consulted.  2.  Atrial fibrillation was ruled out.  Further analysis of telemetry and 12-lead electrocardiogram, revealed premature atrial complexes and premature ventricular complexes.  Metoprolol was exchanged to succinate 25 mg daily, with good toleration.  3.  Acute kidney injury with hypokalemia.  Patient developed worsening kidney function, peak creatinine 2.3, potassium was corrected with potassium chloride.  Nephrotoxic agents were discontinued, and diuresis was held, discharge creatinine 1.7, will continue to hold spironolactone and Entresto, resume furosemide 40 mg daily to keep negative fluid balance.  Follow-up as an outpatient kidney function.   4.  COPD.  No signs of current exacerbation, continue bronchodilator therapy.  5.  Dyslipidemia.  Continue atorvastatin.  6.  History of breast cancer.  Continue anastrozole  7.  Obesity.  Calculated BMI 32, follow-up as an outpatient.  8.  Hypothyroidism.  Continue levothyroxine   Discharge Diagnoses:  Active Problems:   Hypothyroidism   Essential hypertension   Asthma   GERD (gastroesophageal reflux disease)   Obesity   Post-operative nausea and vomiting   CHF (congestive heart failure) (HCC)   Hypokalemia    Discharge Instructions   Allergies as of 09/28/2018      Reactions   Amoxicillin-pot Clavulanate    REACTION: rash  Amoxicillin-pot Clavulanate    Rash   Lisinopril Other (See Comments)   cough   Percodan [oxycodone-aspirin] Nausea And Vomiting, Other (See Comments)   Sweating, unable to sleep   Prednisone Other (See Comments)   Makes pt sweat, unable to sleep      Medication List    STOP taking these medications   ENTRESTO 97-103 MG Generic drug:  sacubitril-valsartan   ibuprofen 200 MG tablet Commonly known as:  ADVIL,MOTRIN   spironolactone 25 MG tablet Commonly known as:  ALDACTONE     TAKE these medications   acetaminophen 500 MG tablet Commonly known as:  TYLENOL Take 500 mg by mouth every 6 (six) hours as needed for moderate pain.   ALBUTEROL SULFATE HFA IN Inhale 2 puffs into the lungs every 4 (four) hours as needed (sob and wheezing).   anastrozole 1 MG tablet Commonly known as:  ARIMIDEX TAKE 1 TABLET BY MOUTH ONCE DAILY What changed:  Another medication with the same name was removed. Continue taking this medication, and follow the directions you see here.   atorvastatin 10 MG tablet Commonly known as:  LIPITOR Take 10 mg by mouth daily.   budesonide-formoterol 160-4.5 MCG/ACT inhaler Commonly known as:  SYMBICORT Inhale 2 puffs into the lungs 2 (two) times daily as needed (sob and wheezing). For shortness of breath   furosemide 40 MG tablet Commonly known as:  LASIX Take 1 tablet (40 mg total) by mouth daily. What changed:    when to take this  reasons to take this   levothyroxine 88 MCG tablet Commonly known as:  SYNTHROID, LEVOTHROID Take 88 mcg by mouth every morning.   loratadine 10 MG tablet Commonly known as:  CLARITIN Take 10 mg by mouth daily.   metoprolol succinate 25 MG 24 hr tablet Commonly known as:  TOPROL-XL Take 1 tablet (25 mg total) by mouth daily.   mometasone-formoterol 100-5 MCG/ACT Aero Commonly known as:  DULERA Inhale 2 puffs into the lungs daily as needed for wheezing.      Follow-up Information    Silvano Bilis, PA  Follow up on 10/06/2018.   Why:  1:00 PM Contact information: [place] [address] [phone]          Allergies  Allergen Reactions  . Amoxicillin-Pot Clavulanate     REACTION: rash  . Amoxicillin-Pot Clavulanate     Rash  . Lisinopril Other (See Comments)    cough  . Percodan [Oxycodone-Aspirin] Nausea And Vomiting and Other (See Comments)    Sweating, unable to sleep  . Prednisone Other (See Comments)    Makes pt sweat, unable to sleep    Consultations:  Cardiology    Procedures/Studies: Dg Chest 2 View  Result Date: 09/24/2018 CLINICAL DATA:  Shortness of breath.  Increased fatigue. EXAM: CHEST - 2 VIEW COMPARISON:  Two-view chest x-ray 12/23/2011. FINDINGS: There is marked interval enlargement of the heart. Mild pulmonary vascular congestion is present without frank edema. Small effusions are present. Progressive endplate degenerative changes are present in the thoracic spine. Vertebral body heights are maintained. IMPRESSION: 1. Progressive cardiomegaly and pulmonary vascular congestion consistent with early congestive heart failure. 2. Small bilateral pleural effusions. Electronically Signed   By: San Morelle M.D.   On: 09/24/2018 20:23       Subjective: Patient is feeling better, no dyspnea or edema, no pnd or chest pain.   Discharge Exam: Vitals:   09/28/18 0544 09/28/18 0936  BP: (!) 107/49  Pulse: 88   Resp: 20   Temp: 98.5 F (36.9 C)   SpO2: 96% 96%   Vitals:   09/27/18 2329 09/28/18 0500 09/28/18 0544 09/28/18 0936  BP: 104/64  (!) 107/49   Pulse: 97  88   Resp: 20  20   Temp: 98.6 F (37 C)  98.5 F (36.9 C)   TempSrc: Oral  Oral   SpO2: 93%  96% 96%  Weight:  84.1 kg    Height:        General: Not in pain or dyspnea  Neurology: Awake and alert, non focal  E ENT: no pallor, no icterus, oral mucosa moist Cardiovascular: No JVD. S1-S2 present, rhythmic, no gallops, rubs, or murmurs. trace lower extremity  edema. Pulmonary: vesicular breath sounds bilaterally, adequate air movement, no wheezing, rhonchi or rales. Gastrointestinal. Abdomen with no organomegaly, non tender, no rebound or guarding Skin. No rashes Musculoskeletal: no joint deformities   The results of significant diagnostics from this hospitalization (including imaging, microbiology, ancillary and laboratory) are listed below for reference.     Microbiology: No results found for this or any previous visit (from the past 240 hour(s)).   Labs: BNP (last 3 results) Recent Labs    09/24/18 2053 09/27/18 0454  BNP 2,107.8* 4,235.3*   Basic Metabolic Panel: Recent Labs  Lab 09/24/18 2053 09/24/18 2230 09/25/18 0514 09/26/18 0439 09/27/18 0454 09/28/18 0514  NA 142  --  138 138 139 140  K 2.8*  --  3.5 3.8 3.3* 3.3*  CL 104  --  104 104 103 105  CO2 24  --  25 23 23 25   GLUCOSE 108*  --  154* 109* 103* 110*  BUN 24*  --  23 28* 34* 37*  CREATININE 0.97  --  1.15* 1.64* 2.34* 1.73*  CALCIUM 8.6*  --  8.2* 8.3* 8.3* 8.4*  MG  --  1.7  --   --   --   --    Liver Function Tests: Recent Labs  Lab 09/24/18 2053 09/25/18 0514  AST 27 30  ALT 23 23  ALKPHOS 54 52  BILITOT 1.3* 1.1  PROT 6.4* 6.1*  ALBUMIN 3.7 3.4*   No results for input(s): LIPASE, AMYLASE in the last 168 hours. No results for input(s): AMMONIA in the last 168 hours. CBC: Recent Labs  Lab 09/24/18 2053 09/25/18 0514  WBC 9.2 8.7  HGB 14.2 14.1  HCT 44.7 44.9  MCV 98.0 97.4  PLT 164 186   Cardiac Enzymes: Recent Labs  Lab 09/25/18 0018 09/25/18 0514 09/25/18 1227  TROPONINI 0.03* 0.04* 0.03*   BNP: Invalid input(s): POCBNP CBG: No results for input(s): GLUCAP in the last 168 hours. D-Dimer No results for input(s): DDIMER in the last 72 hours. Hgb A1c No results for input(s): HGBA1C in the last 72 hours. Lipid Profile No results for input(s): CHOL, HDL, LDLCALC, TRIG, CHOLHDL, LDLDIRECT in the last 72 hours. Thyroid  function studies No results for input(s): TSH, T4TOTAL, T3FREE, THYROIDAB in the last 72 hours.  Invalid input(s): FREET3 Anemia work up No results for input(s): VITAMINB12, FOLATE, FERRITIN, TIBC, IRON, RETICCTPCT in the last 72 hours. Urinalysis    Component Value Date/Time   COLORURINE YELLOW 12/23/2011 1436   APPEARANCEUR CLOUDY (A) 12/23/2011 1436   LABSPEC 1.017 12/23/2011 1436   PHURINE 7.5 12/23/2011 1436   GLUCOSEU NEGATIVE 12/23/2011 1436   HGBUR NEGATIVE 12/23/2011 1436   BILIRUBINUR NEGATIVE 12/23/2011 1436   KETONESUR NEGATIVE 12/23/2011 1436  PROTEINUR NEGATIVE 12/23/2011 1436   UROBILINOGEN 0.2 12/23/2011 1436   NITRITE NEGATIVE 12/23/2011 1436   LEUKOCYTESUR NEGATIVE 12/23/2011 1436   Sepsis Labs Invalid input(s): PROCALCITONIN,  WBC,  LACTICIDVEN Microbiology No results found for this or any previous visit (from the past 240 hour(s)).   Time coordinating discharge: 45 minutes  SIGNED:   Tawni Millers, MD  [facility] 09/28/2018, 11:38 AM Pager [phone]  If 7PM-7AM, please contact night-coverage [email or web address] Password [email or web address]

## 2018-09-28 NOTE — Plan of Care (Signed)
Nutrition Education Note  RD consulted for nutrition education regarding new onset CHF.  RD provided "Low Sodium Nutrition Therapy" handout from the [employer]. Reviewed patient's dietary recall. Provided examples on ways to decrease sodium intake in diet. Discouraged intake of processed foods and use of salt shaker. Encouraged fresh fruits and vegetables as well as whole grain sources of carbohydrates to maximize fiber intake.   RD discussed why it is important for patient to adhere to diet recommendations, and emphasized the role of fluids, foods to avoid, and importance of weighing self daily. Teach back method used.  Pt reports she uses excessive amounts of table salt on prepared food and during cooking. She cans her own vegetables which require 1 tsp of salt per quart. She washes her chicken with salt and lets it sit over night in salt bath. RD explained how to decrease salt in cooking, to use fresh vegetables when able, and to avoid salt shakers. Pt likes to eat at [place] and [place]. Discussed food options that are lower in sodium and encouraged home cooked meals.   Expect okay compliance.  Body mass index is 32.82 kg/m. Pt meets criteria for obese based on current BMI.  Current diet order is heart healthy. Labs and medications reviewed. No further nutrition interventions warranted at this time. RD contact information provided. If additional nutrition issues arise, please re-consult RD.   Mariana Single RD, LDN Clinical Nutrition Pager # [phone]

## 2018-09-28 NOTE — Evaluation (Signed)
Physical Therapy Evaluation Patient Details Name: Erin Novak MRN: [id number] DOB: Jan 12, 1941 Today's Date: 09/28/2018   History of Present Illness  77 year old female who presented with dyspnea and weakness.  She does have significant past medical history of for left breast cancer, hypertension, congestive heart failure, morbid obesity, GERD and dyslipidemia.    Clinical Impression  Patient presents with decreased mobility due to acute illness and bedrest.  Currently S level for hallway ambulation, but gets up from chair on her own.  Feel she can d/c home without PT follow up, but with daughters S frequently at first for safety.  No further acute PT needs as pt to d/c home today.    Follow Up Recommendations Supervision - Intermittent    Equipment Recommendations  None recommended by PT    Recommendations for Other Services       Precautions / Restrictions Precautions Precautions: Fall      Mobility  Bed Mobility               General bed mobility comments: up in chair  Transfers Overall transfer level: Modified independent Equipment used: None                Ambulation/Gait Ambulation/Gait assistance: Supervision Gait Distance (Feet): 200 Feet Assistive device: None Gait Pattern/deviations: Step-through pattern;Decreased stride length;Drifts right/left     General Gait Details: initially more wobbly with ambulation, then more steady as increased distance  Stairs Stairs: Yes Stairs assistance: Supervision Stair Management: Forwards;Alternating pattern;One rail Right;One rail Left Number of Stairs: 4 General stair comments: switched sides with rail turning on steps  Wheelchair Mobility    Modified Rankin (Stroke Patients Only)       Balance Overall balance assessment: Needs assistance   Sitting balance-Leahy Scale: Good       Standing balance-Leahy Scale: Good                   Standardized Balance Assessment Standardized  Balance Assessment : Dynamic Gait Index   Dynamic Gait Index Level Surface: Mild Impairment Change in Gait Speed: Mild Impairment Gait with Horizontal Head Turns: Normal Gait with Vertical Head Turns: Mild Impairment Gait and Pivot Turn: Normal Step Over Obstacle: Normal Step Around Obstacles: Normal Steps: Mild Impairment Total Score: 20       Pertinent Vitals/Pain Pain Assessment: Faces Faces Pain Scale: Hurts little more Pain Location: lower back stiffness Pain Descriptors / Indicators: Tightness Pain Intervention(s): Monitored during session;Repositioned    Home Living Family/patient expects to be discharged to:: Private residence Living Arrangements: Alone Available Help at Discharge: Family;Available PRN/intermittently(daughters live close) Type of Home: House Home Access: Stairs to enter   [employer] of Steps: 2 Home Layout: Multi-level;Able to live on main level with bedroom/bathroom Home Equipment: None      Prior Function Level of Independence: Independent         Comments: daughter lives close and plans to check in frequently and assist with IADL's initially     Hand Dominance        Extremity/Trunk Assessment   Upper Extremity Assessment Upper Extremity Assessment: Overall WFL for tasks assessed    Lower Extremity Assessment Lower Extremity Assessment: Generalized weakness       Communication   Communication: No difficulties  Cognition Arousal/Alertness: Awake/alert Behavior During Therapy: WFL for tasks assessed/performed Overall Cognitive Status: Within Functional Limits for tasks assessed  General Comments General comments (skin integrity, edema, etc.): Educated on safety for home/fall prevention including lighting, footwear, slow rising, shower set up and stairs    Exercises     Assessment/Plan    PT Assessment Patent does not need any further PT services  PT  Problem List         PT Treatment Interventions      PT Goals (Current goals can be found in the Care Plan section)  Acute Rehab PT Goals PT Goal Formulation: All assessment and education complete, DC therapy    Frequency     Barriers to discharge        Co-evaluation               AM-PAC PT "6 Clicks" Daily Activity  Outcome Measure Difficulty turning over in bed (including adjusting bedclothes, sheets and blankets)?: None Difficulty moving from lying on back to sitting on the side of the bed? : A Little Difficulty sitting down on and standing up from a chair with arms (e.g., wheelchair, bedside commode, etc,.)?: A Little Help needed moving to and from a bed to chair (including a wheelchair)?: None Help needed walking in hospital room?: A Little Help needed climbing 3-5 steps with a railing? : A Little 6 Click Score: 20    End of Session   Activity Tolerance: Patient tolerated treatment well Patient left: in chair;with call bell/phone within reach;with family/visitor present   PT Visit Diagnosis: Muscle weakness (generalized) (M62.81)    Time: 6754-4920 PT Time Calculation (min) (ACUTE ONLY): 16 min   Charges:   PT Evaluation $PT Eval Low Complexity: [place], PT Acute Rehabilitation Services [phone] 09/28/2018   Reginia Naas 09/28/2018, 12:04 PM

## 2018-09-28 NOTE — Care Management Note (Signed)
Case Management Note  Patient Details  Name: Erin Novak MRN: [id number] Date of Birth: 07-15-1941  Subjective/Objective:    post-operative nausea and vomiting.                Action/Plan:  Plan to discharge home with family with no needs.    Expected Discharge Date:  09/28/18               Expected Discharge Plan:  Home/Self Care  In-House Referral:     Discharge planning Services  CM Consult  Post Acute Care Choice:    Choice offered to:     DME Arranged:    DME Agency:     HH Arranged:    HH Agency:     Status of Service:  Completed, signed off  If discussed at [employer] of Stay Meetings, dates discussed:    Additional CommentsPurcell Mouton, RN 09/28/2018, 12:19 PM

## 2018-10-01 ENCOUNTER — Emergency Department (HOSPITAL_COMMUNITY): Payer: Medicare Other

## 2018-10-01 ENCOUNTER — Encounter (HOSPITAL_COMMUNITY): Payer: Self-pay

## 2018-10-01 ENCOUNTER — Other Ambulatory Visit: Payer: Self-pay

## 2018-10-01 ENCOUNTER — Emergency Department (HOSPITAL_COMMUNITY)
Admission: EM | Admit: 2018-10-01 | Discharge: 2018-10-01 | Disposition: A | Discharge status: Home / Self Care | Payer: Medicare Other | Attending: Emergency Medicine | Admitting: Emergency Medicine

## 2018-10-01 DIAGNOSIS — Z853 Personal history of malignant neoplasm of breast: Secondary | ICD-10-CM | POA: Insufficient documentation

## 2018-10-01 DIAGNOSIS — I110 Hypertensive heart disease with heart failure: Secondary | ICD-10-CM | POA: Insufficient documentation

## 2018-10-01 DIAGNOSIS — E039 Hypothyroidism, unspecified: Secondary | ICD-10-CM | POA: Diagnosis not present

## 2018-10-01 DIAGNOSIS — I509 Heart failure, unspecified: Secondary | ICD-10-CM | POA: Diagnosis not present

## 2018-10-01 DIAGNOSIS — R2243 Localized swelling, mass and lump, lower limb, bilateral: Secondary | ICD-10-CM | POA: Diagnosis not present

## 2018-10-01 DIAGNOSIS — J45909 Unspecified asthma, uncomplicated: Secondary | ICD-10-CM | POA: Insufficient documentation

## 2018-10-01 DIAGNOSIS — Z79899 Other long term (current) drug therapy: Secondary | ICD-10-CM | POA: Diagnosis not present

## 2018-10-01 DIAGNOSIS — R531 Weakness: Secondary | ICD-10-CM | POA: Diagnosis not present

## 2018-10-01 DIAGNOSIS — R0602 Shortness of breath: Secondary | ICD-10-CM | POA: Diagnosis not present

## 2018-10-01 DIAGNOSIS — R05 Cough: Secondary | ICD-10-CM | POA: Diagnosis not present

## 2018-10-01 LAB — CBC
HCT: 45.3 % (ref 36.0–46.0)
HEMOGLOBIN: 14.2 g/dL (ref 12.0–15.0)
MCH: 31.4 pg (ref 26.0–34.0)
MCHC: 31.3 g/dL (ref 30.0–36.0)
MCV: 100.2 fL — AB (ref 80.0–100.0)
PLATELETS: 179 10*3/uL (ref 150–400)
RBC: 4.52 MIL/uL (ref 3.87–5.11)
RDW: 13.6 % (ref 11.5–15.5)
WBC: 7.1 10*3/uL (ref 4.0–10.5)
nRBC: 0 % (ref 0.0–0.2)

## 2018-10-01 LAB — BASIC METABOLIC PANEL
ANION GAP: 12 (ref 5–15)
BUN: 31 mg/dL — ABNORMAL HIGH (ref 8–23)
CHLORIDE: 103 mmol/L (ref 98–111)
CO2: 25 mmol/L (ref 22–32)
Calcium: 8.7 mg/dL — ABNORMAL LOW (ref 8.9–10.3)
Creatinine, Ser: 1.08 mg/dL — ABNORMAL HIGH (ref 0.44–1.00)
GFR calc non Af Amer: 48 mL/min — ABNORMAL LOW (ref >60)
GFR, EST AFRICAN AMERICAN: 55 mL/min — AB (ref >60)
GLUCOSE: 109 mg/dL — AB (ref 70–99)
Potassium: 3.9 mmol/L (ref 3.5–5.1)
Sodium: 140 mmol/L (ref 135–145)

## 2018-10-01 LAB — BRAIN NATRIURETIC PEPTIDE: B NATRIURETIC PEPTIDE 5: 2197.7 pg/mL — AB (ref 0.0–100.0)

## 2018-10-01 LAB — TROPONIN I: Troponin I: <0.03 ng/mL (ref <0.03)

## 2018-10-01 MED ORDER — SACUBITRIL-VALSARTAN 97-103 MG PO TABS: 1.0000 | ORAL_TABLET | Freq: Two times a day (BID) | ORAL | 0 refills | Status: DC

## 2018-10-01 MED ORDER — SPIRONOLACTONE 25 MG PO TABS: 25.0000 mg | ORAL_TABLET | Freq: Every day | ORAL | 0 refills | Status: DC

## 2018-10-01 MED ORDER — SPIRONOLACTONE 25 MG PO TABS
25.0000 mg | ORAL_TABLET | Freq: Once | ORAL | Status: AC
  Administered 2018-10-01: 25 mg via ORAL
  Filled 2018-10-01: qty 1

## 2018-10-01 MED ORDER — FUROSEMIDE 10 MG/ML IJ SOLN
60.0000 mg | Freq: Once | INTRAMUSCULAR | Status: AC
  Administered 2018-10-01: 60 mg via INTRAVENOUS
  Filled 2018-10-01: qty 8

## 2018-10-01 MED ORDER — SACUBITRIL-VALSARTAN 97-103 MG PO TABS
1.0000 | ORAL_TABLET | Freq: Once | ORAL | Status: AC
  Administered 2018-10-01: 1 via ORAL
  Filled 2018-10-01: qty 1

## 2018-10-01 NOTE — ED Notes (Signed)
Pt states that she has increasing SOB with minimal exertion and even at rest , pt states that symptoms started about 3 days ago after she was discharged from the hospital. Pt has some LE swelling to bilateral ankles. No labored breathing or pain is noted at this time. Pt is escorted with her dtr.

## 2018-10-01 NOTE — ED Triage Notes (Signed)
Patient c/o SOB and fatigue since being discharged 3 days ago with hypokalemia and atrial fib.

## 2018-10-01 NOTE — ED Provider Notes (Signed)
[facility] DEPT Provider Note   CSN: [id number] Arrival date & time: 10/01/18  1042     History   Chief Complaint Chief Complaint  Patient presents with  . Shortness of Breath  . Fatigue    HPI Erin Novak is a 77 y.o. female.  HPI 77 year old female with a history of congestive heart failure who was recently discharged from the hospital for mild CHF exacerbation with IV diuresis in the hospital.  Her Entresto and Aldactone was stopped secondary to renal insufficiency.  She is to resume this as an outpatient.  She initially was feeling fine at time of discharge and reports some new generalized weakness and exertional shortness of breath over the past 2 days.  Her daughter is concerned that her leg swelling is returning as well.  She is doing much better job with compliance with fluid intake and dietary choices that she makes.  Compliant with her 40 mg of Lasix daily.  No fevers or chills.  No productive cough.  No abdominal pain.  Denies nausea vomiting or diarrhea.  No melena or hematochezia   Past Medical History:  Diagnosis Date  . Abdominal pain   . Allergy   . Asthma   . Bronchitis   . Cholelithiasis 06-20-2011   [place]   . Diarrhea   . GERD (gastroesophageal reflux disease)   . Hyperlipidemia   . Hypertension   . Hypothyroidism   . Migraines   . Personal history of radiation therapy 2018  . PONV (postoperative nausea and vomiting)   . Thyroid disease    hypothyroid  . Ulcer     Patient Active Problem List   Diagnosis Date Noted  . CHF (congestive heart failure) ([place]) 09/24/2018  . Hypokalemia 09/24/2018  . Post-operative nausea and vomiting 03/03/2017  . Malignant neoplasm of upper-outer quadrant of left breast in female, estrogen receptor positive ([place]) 01/28/2017  . Cholelithiasis without obstruction 07/15/2011  . Gallstones 07/10/2011  . GERD (gastroesophageal reflux disease) 07/10/2011  . Obesity 07/10/2011  . 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 Hypothyroidism 02/07/2008  . HYPERLIPIDEMIA 02/07/2008  . Essential hypertension 02/07/2008  . Asthma 02/07/2008    Past Surgical History:  Procedure Laterality Date  . ABDOMINAL HYSTERECTOMY    . BREAST LUMPECTOMY Left 03/03/2017  . BREAST LUMPECTOMY WITH RADIOACTIVE SEED AND SENTINEL LYMPH NODE BIOPSY Left 03/03/2017   Procedure: LEFT BREAST LUMPECTOMY WITH RADIOACTIVE SEED X2 AND SENTINEL LYMPH NODE BIOPSY;  Surgeon: Fanny Skates, MD;  Location: [facility];  Service: General;  Laterality: Left;  . CHOLECYSTECTOMY  12/25/2011   Procedure: LAPAROSCOPIC CHOLECYSTECTOMY WITH INTRAOPERATIVE CHOLANGIOGRAM;  Surgeon: Earnstine Regal, MD;  Location: [facility];  Service: General;  Laterality: N/A;  laparoscopic cholecystectomy with intraoperative cholangiogram  . COLONOSCOPY  01/10/2003   internal hemorrhoids (Dr. Lajoyce Corners)  . FOOT SURGERY     right  . hysterectomy    . ruptured disk     in neck  . TONSILLECTOMY    . UPPER GASTROINTESTINAL ENDOSCOPY  01/10/2003   hiatal hernia (Dr. Lajoyce Corners)     OB History   None      Home Medications    Prior to Admission medications   Medication Sig Start Date End Date Taking? Authorizing Provider  albuterol (PROVENTIL HFA;VENTOLIN HFA) 108 (90 Base) MCG/ACT inhaler Inhale 1-2 puffs into the lungs every 6 (six) hours as needed for wheezing or shortness of breath.   Yes [provider]  anastrozole (ARIMIDEX) 1 MG  tablet TAKE 1 TABLET BY MOUTH ONCE DAILY 09/29/18  Yes Magrinat, Virgie Dad, MD  atorvastatin (LIPITOR) 10 MG tablet Take 10 mg by mouth daily.    Yes [provider]  dextromethorphan (DELSYM) 30 MG/5ML liquid Take 30 mg by mouth 2 (two) times daily as needed for cough.   Yes [provider]  furosemide (LASIX) 40 MG tablet Take 1 tablet (40 mg total) by mouth daily. 09/28/18 10/28/18 Yes Arrien, Jimmy Picket, MD  levothyroxine (SYNTHROID, LEVOTHROID) 88 MCG tablet Take 88 mcg by mouth every  morning.    Yes [provider]  loratadine (CLARITIN) 10 MG tablet Take 10 mg by mouth daily.   Yes [provider]  metoprolol succinate (TOPROL-XL) 25 MG 24 hr tablet Take 1 tablet (25 mg total) by mouth daily. 09/28/18 10/28/18 Yes Arrien, Jimmy Picket, MD  mometasone-formoterol [facility]) 100-5 MCG/ACT AERO Inhale 2 puffs into the lungs daily as needed for wheezing.   Yes [provider]  anastrozole (ARIMIDEX) 1 MG tablet TAKE 1 TABLET BY MOUTH ONCE DAILY Patient not taking: Reported on 10/01/2018 06/09/18   Magrinat, Virgie Dad, MD  sacubitril-valsartan (ENTRESTO) 97-103 MG Take 1 tablet by mouth 2 (two) times daily. 10/01/18   Jola Schmidt, MD  spironolactone (ALDACTONE) 25 MG tablet Take 1 tablet (25 mg total) by mouth daily. 10/01/18   Jola Schmidt, MD    Family History Family History  Problem Relation Age of Onset  . Asthma Father   . Colon cancer Neg Hx   . Stomach cancer Neg Hx   . Esophageal cancer Neg Hx   . Rectal cancer Neg Hx     Social History Social History   Tobacco Use  . Smoking status: Never Smoker  . Smokeless tobacco: Never Used  Substance Use Topics  . Alcohol use: Yes    Comment: rare  . Drug use: No     Allergies   Lisinopril; Percodan [oxycodone-aspirin]; Prednisone; and Amoxicillin-pot clavulanate   Review of Systems Review of Systems  All other systems reviewed and are negative.    Physical Exam Updated Vital Signs BP 113/70   Pulse 88   Temp (!) 97.5 F (36.4 C) (Oral)   Resp 19   Ht 5\' 3"  (1.6 m)   Wt 83.9 kg   SpO2 97%   BMI 32.77 kg/m   Physical Exam  Constitutional: She is oriented to person, place, and time. She appears well-developed and well-nourished. No distress.  HENT:  Head: Normocephalic and atraumatic.  Eyes: EOM are normal.  Neck: Normal range of motion.  Cardiovascular: Normal rate, regular rhythm and normal heart sounds.  Pulmonary/Chest: Effort normal and breath sounds normal.    Abdominal: Soft. She exhibits no distension. There is no tenderness.  Musculoskeletal: Normal range of motion.       Right lower leg: She exhibits edema.       Left lower leg: She exhibits edema.  Neurological: She is alert and oriented to person, place, and time.  Skin: Skin is warm and dry.  Psychiatric: She has a normal mood and affect. Judgment normal.  Nursing note and vitals reviewed.    ED Treatments / Results  Labs (all labs ordered are listed, but only abnormal results are displayed) Labs Reviewed  CBC - Abnormal; Notable for the following components:      Result Value   MCV 100.2 (*)    All other components within normal limits  BASIC METABOLIC PANEL - Abnormal; Notable for the following  components:   Glucose, Bld 109 (*)    BUN 31 (*)    Creatinine, Ser 1.08 (*)    Calcium 8.7 (*)    GFR calc non Af Amer 48 (*)    GFR calc Af Amer 55 (*)    All other components within normal limits  BRAIN NATRIURETIC PEPTIDE - Abnormal; Notable for the following components:   B Natriuretic Peptide 2,197.7 (*)    All other components within normal limits  TROPONIN I    EKG EKG Interpretation  Date/Time:  Friday October 01 2018 10:51:04 EST Ventricular Rate:  92 PR Interval:    QRS Duration: 164 QT Interval:  387 QTC Calculation: 482 R Axis:   175 Text Interpretation:  Sinus rhythm Left bundle branch block Confirmed by Virgel Manifold [phone]) on 10/01/2018 10:58:13 AM   Radiology Dg Chest 2 View  Result Date: 10/01/2018 CLINICAL DATA:  Shortness of breath, cough, and fatigue since being discharged 3 days ago for hypokalemia. History of atrial fibrillation, asthma. EXAM: CHEST - 2 VIEW COMPARISON:  Chest x-ray of September 24, 2018 FINDINGS: The lungs are adequately inflated. There is no focal infiltrate. There is a tiny amount of pleural fluid bilaterally. The cardiac silhouette is enlarged. The pulmonary vascularity is mildly engorged. The bony thorax exhibits no acute  abnormality. IMPRESSION: CHF with trace bilateral pleural effusions and very mild interstitial edema. No alveolar pneumonia. Electronically Signed   By: David  Martinique M.D.   On: 10/01/2018 11:42    Procedures Procedures (including critical care time)  Medications Ordered in ED Medications  furosemide (LASIX) injection 60 mg (60 mg Intravenous Given 10/01/18 1304)  sacubitril-valsartan (ENTRESTO) 97-103 mg per tablet (1 tablet Oral Given 10/01/18 1412)  spironolactone (ALDACTONE) tablet 25 mg (25 mg Oral Given 10/01/18 1412)     Initial Impression / Assessment and Plan / ED Course  I have reviewed the triage vital signs and the nursing notes.  Pertinent labs & imaging results that were available during my care of the patient were reviewed by me and considered in my medical decision making (see chart for details).     Mild CHF exacerbation.  IV diuresis with IV Lasix here in the emergency department.  She feels much better and is urinated several times.  She is checking daily weights at home.  Her renal function has improved.  We will put her Entresto and Aldactone back on her medication list and have her restart these medications which likely will help with fluid.  Close follow-up with her cardiologist, Dr. Einar Gip.  Patient is instructed to call his office for close follow-up.  She understands return to the emergency department for new or worsening symptoms  Final Clinical Impressions(s) / ED Diagnoses   Final diagnoses:  Acute on chronic congestive heart failure, unspecified heart failure type [facility])    ED Discharge Orders         Ordered    sacubitril-valsartan (ENTRESTO) 97-103 MG  2 times daily     10/01/18 1411    spironolactone (ALDACTONE) 25 MG tablet  Daily     10/01/18 1411           Jola Schmidt, MD 10/01/18 1416

## 2018-10-05 DIAGNOSIS — I10 Essential (primary) hypertension: Secondary | ICD-10-CM | POA: Diagnosis not present

## 2018-10-08 ENCOUNTER — Encounter (HOSPITAL_COMMUNITY): Payer: Self-pay

## 2018-10-08 ENCOUNTER — Other Ambulatory Visit: Payer: Self-pay

## 2018-10-08 ENCOUNTER — Emergency Department (HOSPITAL_COMMUNITY)
Admission: EM | Admit: 2018-10-08 | Discharge: 2018-10-08 | Disposition: A | Discharge status: Home / Self Care | Payer: Medicare Other | Source: Home / Self Care | Attending: Emergency Medicine | Admitting: Emergency Medicine

## 2018-10-08 ENCOUNTER — Emergency Department (HOSPITAL_COMMUNITY): Payer: Medicare Other

## 2018-10-08 ENCOUNTER — Encounter (HOSPITAL_COMMUNITY): Payer: Self-pay | Admitting: Emergency Medicine

## 2018-10-08 ENCOUNTER — Inpatient Hospital Stay (HOSPITAL_COMMUNITY)
Admission: EM | Admit: 2018-10-08 | Discharge: 2018-10-13 | DRG: 286 | Disposition: A | Discharge status: Home / Self Care | Payer: Medicare Other | Attending: Internal Medicine | Admitting: Internal Medicine

## 2018-10-08 DIAGNOSIS — Z9049 Acquired absence of other specified parts of digestive tract: Secondary | ICD-10-CM

## 2018-10-08 DIAGNOSIS — R0602 Shortness of breath: Secondary | ICD-10-CM | POA: Diagnosis not present

## 2018-10-08 DIAGNOSIS — Z170 Estrogen receptor positive status [ER+]: Secondary | ICD-10-CM

## 2018-10-08 DIAGNOSIS — J45909 Unspecified asthma, uncomplicated: Secondary | ICD-10-CM | POA: Diagnosis present

## 2018-10-08 DIAGNOSIS — N179 Acute kidney failure, unspecified: Secondary | ICD-10-CM | POA: Diagnosis not present

## 2018-10-08 DIAGNOSIS — Z853 Personal history of malignant neoplasm of breast: Secondary | ICD-10-CM

## 2018-10-08 DIAGNOSIS — C50412 Malignant neoplasm of upper-outer quadrant of left female breast: Secondary | ICD-10-CM | POA: Diagnosis not present

## 2018-10-08 DIAGNOSIS — Z923 Personal history of irradiation: Secondary | ICD-10-CM

## 2018-10-08 DIAGNOSIS — I10 Essential (primary) hypertension: Secondary | ICD-10-CM | POA: Diagnosis not present

## 2018-10-08 DIAGNOSIS — I272 Pulmonary hypertension, unspecified: Secondary | ICD-10-CM | POA: Diagnosis present

## 2018-10-08 DIAGNOSIS — Z90710 Acquired absence of both cervix and uterus: Secondary | ICD-10-CM

## 2018-10-08 DIAGNOSIS — K219 Gastro-esophageal reflux disease without esophagitis: Secondary | ICD-10-CM | POA: Diagnosis present

## 2018-10-08 DIAGNOSIS — I130 Hypertensive heart and chronic kidney disease with heart failure and stage 1 through stage 4 chronic kidney disease, or unspecified chronic kidney disease: Principal | ICD-10-CM | POA: Diagnosis present

## 2018-10-08 DIAGNOSIS — I480 Paroxysmal atrial fibrillation: Secondary | ICD-10-CM | POA: Diagnosis not present

## 2018-10-08 DIAGNOSIS — J90 Pleural effusion, not elsewhere classified: Secondary | ICD-10-CM | POA: Diagnosis not present

## 2018-10-08 DIAGNOSIS — Z825 Family history of asthma and other chronic lower respiratory diseases: Secondary | ICD-10-CM

## 2018-10-08 DIAGNOSIS — I428 Other cardiomyopathies: Secondary | ICD-10-CM | POA: Diagnosis present

## 2018-10-08 DIAGNOSIS — I4891 Unspecified atrial fibrillation: Secondary | ICD-10-CM | POA: Diagnosis not present

## 2018-10-08 DIAGNOSIS — I081 Rheumatic disorders of both mitral and tricuspid valves: Secondary | ICD-10-CM | POA: Diagnosis present

## 2018-10-08 DIAGNOSIS — K449 Diaphragmatic hernia without obstruction or gangrene: Secondary | ICD-10-CM | POA: Diagnosis present

## 2018-10-08 DIAGNOSIS — E039 Hypothyroidism, unspecified: Secondary | ICD-10-CM | POA: Diagnosis present

## 2018-10-08 DIAGNOSIS — N183 Chronic kidney disease, stage 3 (moderate): Secondary | ICD-10-CM | POA: Diagnosis present

## 2018-10-08 DIAGNOSIS — Z885 Allergy status to narcotic agent status: Secondary | ICD-10-CM

## 2018-10-08 DIAGNOSIS — I5023 Acute on chronic systolic (congestive) heart failure: Secondary | ICD-10-CM | POA: Diagnosis not present

## 2018-10-08 DIAGNOSIS — L02421 Furuncle of right axilla: Secondary | ICD-10-CM | POA: Diagnosis present

## 2018-10-08 DIAGNOSIS — Z8589 Personal history of malignant neoplasm of other organs and systems: Secondary | ICD-10-CM

## 2018-10-08 DIAGNOSIS — Z79890 Hormone replacement therapy (postmenopausal): Secondary | ICD-10-CM

## 2018-10-08 DIAGNOSIS — I509 Heart failure, unspecified: Secondary | ICD-10-CM

## 2018-10-08 DIAGNOSIS — I447 Left bundle-branch block, unspecified: Secondary | ICD-10-CM | POA: Diagnosis present

## 2018-10-08 DIAGNOSIS — Z7901 Long term (current) use of anticoagulants: Secondary | ICD-10-CM

## 2018-10-08 DIAGNOSIS — I5043 Acute on chronic combined systolic (congestive) and diastolic (congestive) heart failure: Secondary | ICD-10-CM | POA: Diagnosis not present

## 2018-10-08 DIAGNOSIS — Z7951 Long term (current) use of inhaled steroids: Secondary | ICD-10-CM

## 2018-10-08 DIAGNOSIS — Z881 Allergy status to other antibiotic agents status: Secondary | ICD-10-CM

## 2018-10-08 DIAGNOSIS — E785 Hyperlipidemia, unspecified: Secondary | ICD-10-CM | POA: Diagnosis not present

## 2018-10-08 DIAGNOSIS — Z888 Allergy status to other drugs, medicaments and biological substances status: Secondary | ICD-10-CM

## 2018-10-08 DIAGNOSIS — G43909 Migraine, unspecified, not intractable, without status migrainosus: Secondary | ICD-10-CM | POA: Diagnosis present

## 2018-10-08 LAB — CBC WITH DIFFERENTIAL/PLATELET
Abs Immature Granulocytes: 0.04 10*3/uL (ref 0.00–0.07)
Abs Immature Granulocytes: 0.03 10*3/uL (ref 0.00–0.07)
BASOS PCT: 1 %
Basophils Absolute: 0.1 10*3/uL (ref 0.0–0.1)
Basophils Absolute: 0.1 10*3/uL (ref 0.0–0.1)
Basophils Relative: 1 %
EOS ABS: 0.1 10*3/uL (ref 0.0–0.5)
EOS PCT: 1 %
Eosinophils Absolute: 0.1 10*3/uL (ref 0.0–0.5)
Eosinophils Relative: 1 %
HCT: 47.1 % — ABNORMAL HIGH (ref 36.0–46.0)
HCT: 46.7 % — ABNORMAL HIGH (ref 36.0–46.0)
Hemoglobin: 14.7 g/dL (ref 12.0–15.0)
Hemoglobin: 14.8 g/dL (ref 12.0–15.0)
Immature Granulocytes: 1 %
Immature Granulocytes: 0 %
Lymphocytes Relative: 21 %
Lymphocytes Relative: 26 %
Lymphs Abs: 1.8 10*3/uL (ref 0.7–4.0)
Lymphs Abs: 2.2 10*3/uL (ref 0.7–4.0)
MCH: 30.4 pg (ref 26.0–34.0)
MCH: 30.9 pg (ref 26.0–34.0)
MCHC: 31.2 g/dL (ref 30.0–36.0)
MCHC: 31.7 g/dL (ref 30.0–36.0)
MCV: 97.3 fL (ref 80.0–100.0)
MCV: 97.5 fL (ref 80.0–100.0)
MONO ABS: 0.8 10*3/uL (ref 0.1–1.0)
MONOS PCT: 10 %
Monocytes Absolute: 0.8 10*3/uL (ref 0.1–1.0)
Monocytes Relative: 10 %
Neutro Abs: 5.8 10*3/uL (ref 1.7–7.7)
Neutro Abs: 5.2 10*3/uL (ref 1.7–7.7)
Neutrophils Relative %: 66 %
Neutrophils Relative %: 62 %
PLATELETS: 205 10*3/uL (ref 150–400)
Platelets: 206 10*3/uL (ref 150–400)
RBC: 4.84 MIL/uL (ref 3.87–5.11)
RBC: 4.79 MIL/uL (ref 3.87–5.11)
RDW: 13.5 % (ref 11.5–15.5)
RDW: 13.6 % (ref 11.5–15.5)
WBC: 8.5 10*3/uL (ref 4.0–10.5)
WBC: 8.3 10*3/uL (ref 4.0–10.5)
nRBC: 0 % (ref 0.0–0.2)
nRBC: 0 % (ref 0.0–0.2)

## 2018-10-08 LAB — BASIC METABOLIC PANEL
Anion gap: 10 (ref 5–15)
BUN: 23 mg/dL (ref 8–23)
CO2: 21 mmol/L — ABNORMAL LOW (ref 22–32)
Calcium: 9 mg/dL (ref 8.9–10.3)
Chloride: 107 mmol/L (ref 98–111)
Creatinine, Ser: 1.26 mg/dL — ABNORMAL HIGH (ref 0.44–1.00)
GFR calc Af Amer: 46 mL/min — ABNORMAL LOW (ref >60)
GFR calc non Af Amer: 40 mL/min — ABNORMAL LOW (ref >60)
Glucose, Bld: 134 mg/dL — ABNORMAL HIGH (ref 70–99)
Potassium: 3.8 mmol/L (ref 3.5–5.1)
Sodium: 138 mmol/L (ref 135–145)

## 2018-10-08 LAB — COMPREHENSIVE METABOLIC PANEL
ALBUMIN: 3.6 g/dL (ref 3.5–5.0)
ALK PHOS: 47 U/L (ref 38–126)
ALT: 23 U/L (ref 0–44)
AST: 25 U/L (ref 15–41)
Anion gap: 11 (ref 5–15)
BUN: 23 mg/dL (ref 8–23)
CALCIUM: 8.9 mg/dL (ref 8.9–10.3)
CO2: 21 mmol/L — AB (ref 22–32)
CREATININE: 1.23 mg/dL — AB (ref 0.44–1.00)
Chloride: 107 mmol/L (ref 98–111)
GFR calc Af Amer: 47 mL/min — ABNORMAL LOW (ref >60)
GFR calc non Af Amer: 41 mL/min — ABNORMAL LOW (ref >60)
GLUCOSE: 110 mg/dL — AB (ref 70–99)
Potassium: 4 mmol/L (ref 3.5–5.1)
Sodium: 139 mmol/L (ref 135–145)
Total Bilirubin: 0.9 mg/dL (ref 0.3–1.2)
Total Protein: 6.6 g/dL (ref 6.5–8.1)

## 2018-10-08 LAB — MAGNESIUM
Magnesium: 2 mg/dL (ref 1.7–2.4)
Magnesium: 1.8 mg/dL (ref 1.7–2.4)

## 2018-10-08 LAB — I-STAT TROPONIN, ED: TROPONIN I, POC: 0.01 ng/mL (ref 0.00–0.08)

## 2018-10-08 LAB — BRAIN NATRIURETIC PEPTIDE: B Natriuretic Peptide: 2876.6 pg/mL — ABNORMAL HIGH (ref 0.0–100.0)

## 2018-10-08 LAB — TSH: TSH: 6.152 u[IU]/mL — ABNORMAL HIGH (ref 0.350–4.500)

## 2018-10-08 MED ORDER — METOPROLOL TARTRATE 5 MG/5ML IV SOLN
2.5000 mg | Freq: Once | INTRAVENOUS | Status: AC
  Administered 2018-10-08: 2.5 mg via INTRAVENOUS
  Filled 2018-10-08: qty 5

## 2018-10-08 MED ORDER — FUROSEMIDE 10 MG/ML IJ SOLN
20.0000 mg | Freq: Once | INTRAMUSCULAR | Status: AC
  Administered 2018-10-08: 20 mg via INTRAVENOUS
  Filled 2018-10-08: qty 2

## 2018-10-08 MED ORDER — APIXABAN 5 MG PO TABS
5.0000 mg | ORAL_TABLET | Freq: Two times a day (BID) | ORAL | Status: DC
  Administered 2018-10-08 – 2018-10-09 (×2): 5 mg via ORAL
  Filled 2018-10-08 (×2): qty 1

## 2018-10-08 MED ORDER — ACETAMINOPHEN 325 MG PO TABS
650.0000 mg | ORAL_TABLET | Freq: Four times a day (QID) | ORAL | Status: DC | PRN
  Filled 2018-10-08: qty 2

## 2018-10-08 MED ORDER — ALBUTEROL SULFATE (2.5 MG/3ML) 0.083% IN NEBU
5.0000 mg | INHALATION_SOLUTION | Freq: Once | RESPIRATORY_TRACT | Status: AC
  Administered 2018-10-08: 5 mg via RESPIRATORY_TRACT
  Filled 2018-10-08: qty 6

## 2018-10-08 MED ORDER — SPIRONOLACTONE 25 MG PO TABS
25.0000 mg | ORAL_TABLET | Freq: Every day | ORAL | Status: DC
  Administered 2018-10-09 – 2018-10-10 (×2): 25 mg via ORAL
  Filled 2018-10-08 (×2): qty 1

## 2018-10-08 MED ORDER — APIXABAN 5 MG PO TABS: 5.0000 mg | ORAL_TABLET | Freq: Two times a day (BID) | ORAL | 0 refills | Status: DC

## 2018-10-08 MED ORDER — ANASTROZOLE 1 MG PO TABS
1.0000 mg | ORAL_TABLET | Freq: Every day | ORAL | Status: DC
  Administered 2018-10-09 – 2018-10-13 (×5): 1 mg via ORAL
  Filled 2018-10-08 (×5): qty 1

## 2018-10-08 MED ORDER — DEXTROMETHORPHAN POLISTIREX ER 30 MG/5ML PO SUER
30.0000 mg | Freq: Two times a day (BID) | ORAL | Status: DC | PRN
  Administered 2018-10-09: 30 mg via ORAL
  Filled 2018-10-08 (×2): qty 5

## 2018-10-08 MED ORDER — ACETAMINOPHEN 650 MG RE SUPP: 650.0000 mg | Freq: Four times a day (QID) | RECTAL | Status: DC | PRN

## 2018-10-08 MED ORDER — DILTIAZEM LOAD VIA INFUSION
10.0000 mg | Freq: Once | INTRAVENOUS | Status: AC
  Administered 2018-10-08: 10 mg via INTRAVENOUS
  Filled 2018-10-08: qty 10

## 2018-10-08 MED ORDER — LEVOTHYROXINE SODIUM 88 MCG PO TABS
88.0000 ug | ORAL_TABLET | ORAL | Status: DC
  Administered 2018-10-10 – 2018-10-13 (×4): 88 ug via ORAL
  Filled 2018-10-08 (×4): qty 1

## 2018-10-08 MED ORDER — FUROSEMIDE 40 MG PO TABS
40.0000 mg | ORAL_TABLET | Freq: Every day | ORAL | Status: DC
  Administered 2018-10-09 – 2018-10-13 (×5): 40 mg via ORAL
  Filled 2018-10-08 (×5): qty 1

## 2018-10-08 MED ORDER — POLYETHYLENE GLYCOL 3350 17 G PO PACK: 17.0000 g | PACK | Freq: Every day | ORAL | Status: DC | PRN

## 2018-10-08 MED ORDER — ATORVASTATIN CALCIUM 10 MG PO TABS
10.0000 mg | ORAL_TABLET | Freq: Every day | ORAL | Status: DC
  Administered 2018-10-09 – 2018-10-13 (×5): 10 mg via ORAL
  Filled 2018-10-08 (×5): qty 1

## 2018-10-08 MED ORDER — SACUBITRIL-VALSARTAN 97-103 MG PO TABS
1.0000 | ORAL_TABLET | Freq: Two times a day (BID) | ORAL | Status: DC
  Administered 2018-10-08 – 2018-10-10 (×5): 1 via ORAL
  Filled 2018-10-08 (×7): qty 1

## 2018-10-08 MED ORDER — PROMETHAZINE HCL 25 MG PO TABS
12.5000 mg | ORAL_TABLET | Freq: Four times a day (QID) | ORAL | Status: DC | PRN
  Administered 2018-10-09 (×2): 12.5 mg via ORAL
  Filled 2018-10-08 (×2): qty 1

## 2018-10-08 MED ORDER — DILTIAZEM HCL-DEXTROSE 100-5 MG/100ML-% IV SOLN (PREMIX)
5.0000 mg/h | INTRAVENOUS | Status: DC
  Administered 2018-10-08: 5 mg/h via INTRAVENOUS
  Filled 2018-10-08: qty 100

## 2018-10-08 MED ORDER — LORATADINE 10 MG PO TABS
10.0000 mg | ORAL_TABLET | Freq: Every day | ORAL | Status: DC
  Administered 2018-10-09 – 2018-10-13 (×5): 10 mg via ORAL
  Filled 2018-10-08 (×5): qty 1

## 2018-10-08 MED ORDER — METOPROLOL SUCCINATE ER 25 MG PO TB24
25.0000 mg | ORAL_TABLET | Freq: Every day | ORAL | Status: DC
  Administered 2018-10-09 – 2018-10-13 (×5): 25 mg via ORAL
  Filled 2018-10-08 (×5): qty 1

## 2018-10-08 NOTE — ED Triage Notes (Signed)
patient states she was discharged today from the ED with a new diagnosis of atrial fib. Patient states she got home and had SOB and fatigue and felt like she could not walk around her house without being SOB. patient stated, "It is not as bad as it had been, but I was still SOB."

## 2018-10-08 NOTE — Discharge Instructions (Signed)
Keep your appointment with Dr Einar Gip on Monday as previously scheduled. Discuss your ED visit today.

## 2018-10-08 NOTE — ED Provider Notes (Signed)
DEPT Provider Note   CSN: [id number] Arrival date & time: 10/08/18  1820     History   Chief Complaint Chief Complaint  Patient presents with  . Shortness of Breath  . Fatigue    HPI Erin Novak is a 77 y.o. female.  The history is provided by the patient and medical records. No language interpreter was used.  Shortness of Breath  This is a recurrent problem. The average episode lasts 3 hours. The problem occurs continuously.The current episode started 3 to 5 hours ago. The problem has not changed since onset.Associated symptoms include leg swelling (mild chronic bilateral leg edema). Pertinent negatives include no fever, no headaches, no rhinorrhea, no neck pain, no cough, no sputum production, no wheezing, no chest pain, no syncope, no vomiting, no abdominal pain, no rash and no leg pain. She has tried nothing for the symptoms. The treatment provided no relief. She has had prior hospitalizations. Associated medical issues include asthma and heart failure.    Past Medical History:  Diagnosis Date  . Abdominal pain   . Allergy   . Asthma   . Bronchitis   . Cholelithiasis 06-20-2011   [place]   . Diarrhea   . GERD (gastroesophageal reflux disease)   . Hyperlipidemia   . Hypertension   . Hypothyroidism   . Migraines   . Personal history of radiation therapy 2018  . PONV (postoperative nausea and vomiting)   . Thyroid disease    hypothyroid  . Ulcer     Patient Active Problem List   Diagnosis Date Noted  . CHF (congestive heart failure) ([place]) 09/24/2018  . Hypokalemia 09/24/2018  . Post-operative nausea and vomiting 03/03/2017  . Malignant neoplasm of upper-outer quadrant of left breast in female, estrogen receptor positive ([place]) 01/28/2017  . Cholelithiasis without obstruction 07/15/2011  . Gallstones 07/10/2011  . GERD (gastroesophageal reflux disease) 07/10/2011  . Obesity 07/10/2011  . 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  Hypothyroidism 02/07/2008  . HYPERLIPIDEMIA 02/07/2008  . Essential hypertension 02/07/2008  . Asthma 02/07/2008    Past Surgical History:  Procedure Laterality Date  . ABDOMINAL HYSTERECTOMY    . BREAST LUMPECTOMY Left 03/03/2017  . BREAST LUMPECTOMY WITH RADIOACTIVE SEED AND SENTINEL LYMPH NODE BIOPSY Left 03/03/2017   Procedure: LEFT BREAST LUMPECTOMY WITH RADIOACTIVE SEED X2 AND SENTINEL LYMPH NODE BIOPSY;  Surgeon: Fanny Skates, MD;  Location: [facility];  Service: General;  Laterality: Left;  . CHOLECYSTECTOMY  12/25/2011   Procedure: LAPAROSCOPIC CHOLECYSTECTOMY WITH INTRAOPERATIVE CHOLANGIOGRAM;  Surgeon: Earnstine Regal, MD;  Location: [facility];  Service: General;  Laterality: N/A;  laparoscopic cholecystectomy with intraoperative cholangiogram  . COLONOSCOPY  01/10/2003   internal hemorrhoids (Dr. Lajoyce Corners)  . FOOT SURGERY     right  . hysterectomy    . ruptured disk     in neck  . TONSILLECTOMY    . UPPER GASTROINTESTINAL ENDOSCOPY  01/10/2003   hiatal hernia (Dr. Lajoyce Corners)     OB History   None      Home Medications    Prior to Admission medications   Medication Sig Start Date End Date Taking? Authorizing Provider  acetaminophen (TYLENOL) 500 MG tablet Take 1,000 mg by mouth every 6 (six) hours as needed for mild pain or headache.    [provider]  anastrozole (ARIMIDEX) 1 MG tablet TAKE 1 TABLET BY MOUTH ONCE DAILY Patient not taking: Reported on 10/01/2018 06/09/18   Magrinat, Virgie Dad, MD  anastrozole (  ARIMIDEX) 1 MG tablet TAKE 1 TABLET BY MOUTH ONCE DAILY Patient taking differently: Take 1 mg by mouth daily.  09/29/18   Magrinat, Virgie Dad, MD  apixaban (ELIQUIS) 5 MG TABS tablet Take 1 tablet (5 mg total) by mouth 2 (two) times daily. 10/08/18 11/07/18  Virgel Manifold, MD  atorvastatin (LIPITOR) 10 MG tablet Take 10 mg by mouth daily.     [provider]  dextromethorphan (DELSYM) 30 MG/5ML liquid Take 30 mg by mouth 2 (two) times daily as  needed for cough.    [provider]  furosemide (LASIX) 40 MG tablet Take 1 tablet (40 mg total) by mouth daily. 09/28/18 10/28/18  Arrien, Jimmy Picket, MD  levothyroxine (SYNTHROID, LEVOTHROID) 88 MCG tablet Take 88 mcg by mouth every morning.     [provider]  loratadine (CLARITIN) 10 MG tablet Take 10 mg by mouth daily.    [provider]  metoprolol succinate (TOPROL-XL) 25 MG 24 hr tablet Take 1 tablet (25 mg total) by mouth daily. 09/28/18 10/28/18  Arrien, Jimmy Picket, MD  mometasone-formoterol (DULERA) 100-5 MCG/ACT AERO Inhale 2 puffs into the lungs daily as needed for wheezing.    [provider]  sacubitril-valsartan (ENTRESTO) 97-103 MG Take 1 tablet by mouth 2 (two) times daily. 10/01/18   Jola Schmidt, MD  spironolactone (ALDACTONE) 25 MG tablet Take 1 tablet (25 mg total) by mouth daily. 10/01/18   Jola Schmidt, MD    Family History Family History  Problem Relation Age of Onset  . Asthma Father   . Colon cancer Neg Hx   . Stomach cancer Neg Hx   . Esophageal cancer Neg Hx   . Rectal cancer Neg Hx     Social History Social History   Tobacco Use  . Smoking status: Never Smoker  . Smokeless tobacco: Never Used  Substance Use Topics  . Alcohol use: Yes    Comment: rare  . Drug use: No     Allergies   Lisinopril; Percodan [oxycodone-aspirin]; Prednisone; and Amoxicillin-pot clavulanate   Review of Systems Review of Systems  Constitutional: Positive for fatigue. Negative for chills, diaphoresis and fever.  HENT: Negative for rhinorrhea.   Eyes: Negative for visual disturbance.  Respiratory: Positive for shortness of breath. Negative for cough, sputum production, chest tightness and wheezing.   Cardiovascular: Positive for leg swelling (mild chronic bilateral leg edema). Negative for chest pain, palpitations and syncope.  Gastrointestinal: Positive for nausea. Negative for abdominal pain, constipation, diarrhea and  vomiting.  Genitourinary: Negative for dysuria and flank pain.  Musculoskeletal: Negative for back pain, neck pain and neck stiffness.  Skin: Negative for rash and wound.  Neurological: Negative for light-headedness and headaches.  Psychiatric/Behavioral: Negative for agitation and confusion.  All other systems reviewed and are negative.    Physical Exam Updated Vital Signs BP 116/76 (BP Location: Left Arm)   Pulse 60   Temp 97.6 F (36.4 C) (Oral)   Resp 16   Ht 5\' 3"  (1.6 m)   Wt 79.8 kg   SpO2 99%   BMI 31.18 kg/m   Physical Exam  Constitutional: She appears well-developed and well-nourished.  Non-toxic appearance. She does not appear ill. No distress.  HENT:  Head: Normocephalic and atraumatic.  Mouth/Throat: Oropharynx is clear and moist.  Eyes: Pupils are equal, round, and reactive to light. Conjunctivae and EOM are normal.  Neck: Normal range of motion. Neck supple.  Cardiovascular: Normal rate and regular rhythm.  No murmur heard. Pulmonary/Chest: Effort  normal. Tachypnea noted. No respiratory distress. She has no decreased breath sounds. She has no wheezes. She has no rhonchi. She has rales.  Abdominal: Soft. She exhibits no distension. There is no tenderness.  Musculoskeletal:       Right lower leg: She exhibits edema (mild).       Left lower leg: She exhibits edema (mild).  Neurological: She is alert.  Skin: Skin is warm and dry.  Psychiatric: She has a normal mood and affect.  Nursing note and vitals reviewed.    ED Treatments / Results  Labs (all labs ordered are listed, but only abnormal results are displayed) Labs Reviewed  CBC WITH DIFFERENTIAL/PLATELET - Abnormal; Notable for the following components:      Result Value   HCT 46.7 (*)    All other components within normal limits  COMPREHENSIVE METABOLIC PANEL - Abnormal; Notable for the following components:   CO2 21 (*)    Glucose, Bld 110 (*)    Creatinine, Ser 1.23 (*)    GFR calc non Af Amer  41 (*)    GFR calc Af Amer 47 (*)    All other components within normal limits  MAGNESIUM  BASIC METABOLIC PANEL  I-STAT TROPONIN, ED  I-STAT TROPONIN, ED    EKG EKG Interpretation  Date/Time:  Friday October 08 2018 19:09:51 EST Ventricular Rate:  104 PR Interval:    QRS Duration: 164 QT Interval:  395 QTC Calculation: 520 R Axis:   -152 Text Interpretation:  Right and left arm electrode reversal, interpretation assumes no reversal Sinus tachycardia Nonspecific intraventricular conduction delay Probable anteroseptal infarct, recent When comapred to prior, no signficnat changes from last ECG earlier today.  NO STEMI Confirmed by Antony Blackbird [phone]) on 10/08/2018 7:26:11 PM   Radiology Dg Chest 2 View  Result Date: 10/08/2018 CLINICAL DATA:  Fatigue and shortness of breath. EXAM: CHEST - 2 VIEW COMPARISON:  10/01/2018 and prior radiographs FINDINGS: Cardiomegaly again noted. Trace bilateral pleural effusions are again noted. There is no evidence of focal airspace disease, pulmonary edema, suspicious pulmonary nodule/mass or pneumothorax. No acute bony abnormalities are identified. IMPRESSION: Cardiomegaly with trace bilateral pleural effusions. No significant change. Electronically Signed   By: Margarette Canada M.D.   On: 10/08/2018 10:31    Procedures Procedures (including critical care time)  Medications Ordered in ED Medications  anastrozole (ARIMIDEX) tablet 1 mg (has no administration in time range)  atorvastatin (LIPITOR) tablet 10 mg (has no administration in time range)  furosemide (LASIX) tablet 40 mg (has no administration in time range)  metoprolol succinate (TOPROL-XL) 24 hr tablet 25 mg (has no administration in time range)  sacubitril-valsartan (ENTRESTO) 97-103 mg per tablet (has no administration in time range)  spironolactone (ALDACTONE) tablet 25 mg (has no administration in time range)  levothyroxine (SYNTHROID, LEVOTHROID) tablet 88 mcg (has no administration  in time range)  apixaban (ELIQUIS) tablet 5 mg (5 mg Oral Given 10/08/18 2245)  dextromethorphan (DELSYM) 30 MG/5ML liquid 30 mg (has no administration in time range)  loratadine (CLARITIN) tablet 10 mg (has no administration in time range)  acetaminophen (TYLENOL) tablet 650 mg (has no administration in time range)    Or  acetaminophen (TYLENOL) suppository 650 mg (has no administration in time range)  polyethylene glycol (MIRALAX / GLYCOLAX) packet 17 g (has no administration in time range)  promethazine (PHENERGAN) tablet 12.5 mg (has no administration in time range)  metoprolol tartrate (LOPRESSOR) injection 2.5 mg (2.5 mg Intravenous Given 10/08/18 2250)  furosemide (LASIX) injection 20 mg (20 mg Intravenous Given 10/08/18 2247)     Initial Impression / Assessment and Plan / ED Course  I have reviewed the triage vital signs and the nursing notes.  Pertinent labs & imaging results that were available during my care of the patient were reviewed by me and considered in my medical decision making (see chart for details).     Erin Novak is a 77 y.o. female with past medical history significant for CHF, GERD, hypertension, hyperlipidemia, hypothyroidism, asthma, and recent admission for CHF exacerbation as well as a diagnosis of new A. fib with RVR today who returns for severe shortness of breath.  Patient reports that since discharge last week for CHF exacerbation, this is her third visit to the ED for shortness of breath problems.  She reports that she was seen earlier today and found to have A. fib with RVR.  She reports that they were able to control the rate and start her on Eliquis.  She then was able to go home.  After getting home, patient reports that she had worsened shortness of breath and did not feel safe at home.  She denies significant chest pain, fevers, chills, conservation, diarrhea, or dysuria.  She does report some nausea when she was having the shortness of breath.  On  exam, patient has crackles in the base of her lungs but no significant wheezing.  Chest was nontender.  Murmur was appreciated.  Abdomen was nontender.  Legs have mild edema however patient reports this is chronic.  As this is the third visit for the patient and her labs earlier today showed fluid overload with a BNP of 2800 that is up from 2100 several days ago.   Patient will have repeat blood work and a new chest x-ray as her shortness of breath is returned however anticipate patient will require admission for CHF exacerbation with worsening fluid overload requiring inpatient diuresis.  Repeat blood work similar to prior.  Chest x-ray shows continued fluid overload  Given her failure of outpatient management, patient will be admitted for further management and diuresis.  Patient admitted in stable condition.  Final Clinical Impressions(s) / ED Diagnoses   Final diagnoses:  Shortness of breath    ED Discharge Orders    None     Clinical Impression: 1. Shortness of breath     Disposition: Admit  This note was prepared with assistance of Dragon voice recognition software. Occasional wrong-word or sound-a-like substitutions may have occurred due to the inherent limitations of voice recognition software.     [place] Current, Gwenyth Allegra, MD 10/09/18 0230

## 2018-10-08 NOTE — ED Notes (Signed)
ED TO INPATIENT HANDOFF REPORT  Name/Age/Gender Erin Novak 77 y.o. female  Code Status Code Status History    Date Active Date Inactive Code Status Order ID Comments User Context   09/24/2018 2349 09/28/2018 1546 Full Code [id number]  Elwyn Reach, MD Inpatient   03/03/2017 1723 03/04/2017 1018 Full Code [id number]  Clovis Riley, MD Inpatient      Home/SNF/Other Home  Chief Complaint SOB; nausea  Level of Care/Admitting Diagnosis ED Disposition    ED Disposition Condition [facility]: [place] [[id number]]  Level of Care: Telemetry [5]  Admit to tele based on following criteria: Acute CHF  Diagnosis: Acute on chronic systolic CHF (congestive heart failure) [facility]) [[id number]]  Admitting Physician: Shelbie Proctor [[id number]]  Attending Physician: Shelbie Proctor [[id number]]  PT Class (Do Not Modify): Observation [104]  PT Acc Code (Do Not Modify): Observation [10022]       Medical History Past Medical History:  Diagnosis Date  . Abdominal pain   . Allergy   . Asthma   . Bronchitis   . Cholelithiasis 06-20-2011   [place]   . Diarrhea   . GERD (gastroesophageal reflux disease)   . Hyperlipidemia   . Hypertension   . Hypothyroidism   . Migraines   . Personal history of radiation therapy 2018  . PONV (postoperative nausea and vomiting)   . Thyroid disease    hypothyroid  . Ulcer     Allergies Allergies  Allergen Reactions  . Lisinopril Cough  . Percodan [Oxycodone-Aspirin] Nausea And Vomiting and Other (See Comments)    Sweating, unable to sleep  . Prednisone Other (See Comments)    Makes pt sweat, unable to sleep  . Amoxicillin-Pot Clavulanate Rash    Has patient had a PCN reaction causing immediate rash, facial/tongue/throat swelling, SOB or lightheadedness with hypotension: Unknown Has patient had a PCN reaction causing severe rash involving mucus membranes or skin necrosis: Unknown Has patient had a PCN  reaction that required hospitalization: Unknown Has patient had a PCN reaction occurring within the last 10 years: No If all of the above answers are "NO", then may proceed with Cephalosporin use.     IV Location/Drains/Wounds Patient Lines/Drains/Airways Status   Active Line/Drains/Airways    None          Labs/Imaging Results for orders placed or performed during the hospital encounter of 10/08/18 (from the past 48 hour(s))  Magnesium     Status: None   Collection Time: 10/08/18  7:02 PM  Result Value Ref Range   Magnesium 1.8 1.7 - 2.4 mg/dL    Comment: Performed at [facility], [place] [address] [place], [place] [address]  CBC with Differential     Status: Abnormal   Collection Time: 10/08/18  7:02 PM  Result Value Ref Range   WBC 8.3 4.0 - 10.5 K/uL   RBC 4.79 3.87 - 5.11 MIL/uL   Hemoglobin 14.8 12.0 - 15.0 g/dL   HCT 46.7 (H) 36.0 - 46.0 %   MCV 97.5 80.0 - 100.0 fL   MCH 30.9 26.0 - 34.0 pg   MCHC 31.7 30.0 - 36.0 g/dL   RDW 13.6 11.5 - 15.5 %   Platelets 205 150 - 400 K/uL   nRBC 0.0 0.0 - 0.2 %   Neutrophils Relative % 62 %   Neutro Abs 5.2 1.7 - 7.7 K/uL   Lymphocytes Relative 26 %   Lymphs Abs 2.2 0.7 - 4.0 K/uL  Monocytes Relative 10 %   Monocytes Absolute 0.8 0.1 - 1.0 K/uL   Eosinophils Relative 1 %   Eosinophils Absolute 0.1 0.0 - 0.5 K/uL   Basophils Relative 1 %   Basophils Absolute 0.1 0.0 - 0.1 K/uL   Immature Granulocytes 0 %   Abs Immature Granulocytes 0.03 0.00 - 0.07 K/uL    Comment: Performed at [facility], [place] [address], [place], [place] [address]  Comprehensive metabolic panel     Status: Abnormal   Collection Time: 10/08/18  7:02 PM  Result Value Ref Range   Sodium 139 135 - 145 mmol/L   Potassium 4.0 3.5 - 5.1 mmol/L   Chloride 107 98 - 111 mmol/L   CO2 21 (L) 22 - 32 mmol/L   Glucose, Bld 110 (H) 70 - 99 mg/dL   BUN 23 8 - 23 mg/dL   Creatinine, Ser 1.23 (H) 0.44 - 1.00 mg/dL   Calcium 8.9  8.9 - 10.3 mg/dL   Total Protein 6.6 6.5 - 8.1 g/dL   Albumin 3.6 3.5 - 5.0 g/dL   AST 25 15 - 41 U/L   ALT 23 0 - 44 U/L   Alkaline Phosphatase 47 38 - 126 U/L   Total Bilirubin 0.9 0.3 - 1.2 mg/dL   GFR calc non Af Amer 41 (L) >60 mL/min   GFR calc Af Amer 47 (L) >60 mL/min    Comment: (NOTE) The eGFR has been calculated using the CKD EPI equation. This calculation has not been validated in all clinical situations. eGFR's persistently <60 mL/min signify possible Chronic Kidney Disease.    Anion gap 11 5 - 15    Comment: Performed at [facility], [place] [address], [place], [place] [address]  I-stat troponin, ED     Status: None   Collection Time: 10/08/18  7:07 PM  Result Value Ref Range   Troponin i, poc 0.01 0.00 - 0.08 ng/mL   Comment 3            Comment: Due to the release kinetics of cTnI, a negative result within the first hours of the onset of symptoms does not rule out myocardial infarction with certainty. If myocardial infarction is still suspected, repeat the test at appropriate intervals.    Dg Chest 2 View  Result Date: 10/08/2018 CLINICAL DATA:  Diagnosis from the emergency department with new diagnosis of atrial fibrillation today. Increasing shortness of breath and fatigue at home. EXAM: CHEST - 2 VIEW COMPARISON:  10/08/2018 and 10/01/2018 radiographs. FINDINGS: 1928 hours. Stable cardiomegaly, tiny bilateral pleural effusions and mild interstitial prominence. There is no invert pulmonary edema, confluent airspace opacity or pneumothorax. Telemetry leads overlie the chest. No acute osseous findings. IMPRESSION: Stable chest with cardiomegaly and trace bilateral pleural effusions. Electronically Signed   By: Richardean Sale M.D.   On: 10/08/2018 19:47   Dg Chest 2 View  Result Date: 10/08/2018 CLINICAL DATA:  Fatigue and shortness of breath. EXAM: CHEST - 2 VIEW COMPARISON:  10/01/2018 and prior radiographs FINDINGS: Cardiomegaly again noted.  Trace bilateral pleural effusions are again noted. There is no evidence of focal airspace disease, pulmonary edema, suspicious pulmonary nodule/mass or pneumothorax. No acute bony abnormalities are identified. IMPRESSION: Cardiomegaly with trace bilateral pleural effusions. No significant change. Electronically Signed   By: Margarette Canada M.D.   On: 10/08/2018 10:31    Pending Labs Unresulted Labs (From admission, onward)   None      Vitals/Pain Today's Vitals  10/08/18 1826 10/08/18 1829 10/08/18 2039  BP: 116/76  (!) 144/121  Pulse: 60  (!) 115  Resp: 16  (!) 24  Temp: 97.6 F (36.4 C)    TempSrc: Oral    SpO2: 99%  99%  Weight:  79.8 kg   Height:  _0  (1.6 m)   PainSc:  0-No pain     Isolation Precautions No active isolations  Medications Medications - No data to display  Mobility walks

## 2018-10-08 NOTE — ED Triage Notes (Signed)
Pt c/o fatigue and SOB esp when walking around for couple days. Was recently here for fluid on her lungs.

## 2018-10-08 NOTE — ED Notes (Signed)
Patient transported to X-ray 

## 2018-10-08 NOTE — ED Notes (Signed)
Pt Cardizem titrated to 10 mg/h per standing orders.  HR 117-120.

## 2018-10-08 NOTE — H&P (Signed)
History and Physical    Erin Novak LDJ:[id number] DOB: 05/23/1941 DOA: 10/08/2018  PCP: [name], MD  Patient coming from: Home  I have personally briefly reviewed patient's old medical records in [place]  Chief Complaint: SOB  HPI: Erin Novak is a 77 y.o. female with medical history significant of CHF presents with shortness of breath.  Patient came to the ED today complaining of shortness of breath.  She was found to be in new onset A. fib.  She was given a dose of IV diltiazem with conversion to sinus rhythm.  She was  Feeling  better so she was discharged on Eliquis.*A family member tells me that she was also told to restart a beta-blocker that started with a C.  When she got home and started to ambulate some, then  she became short of breath again and return to the ED.  Her chest x-ray shows a mild minimal basilar effusions.  BNP is elevated to 2800 range from 2100 earlier during the day.  Patient denies any chest pain or palpitations.  She was not in A. fib at this time.  Patient was admitted November 12 for CHF exacerbation.  She also had AKI so her Entresto and Aldactone were held at that time.  It was initially thought at that admit t that she was in A. fib but it was ruled out upon discharge.  She has since  come back to the ED on the 15th.  She was found to be volume overloaded.  She was given IV Lasix and told to restart her Entresto and Aldactone.  She has a follow-up next week with her cardiologist. ED Course: She received no meds or intervention for this ED visit.  Review of Systems: + sob, no Cp or palpitations, + anorexia  All others reviewed with patient  and are  negative unless otherwise stated  Past Medical History:  Diagnosis Date  . Abdominal pain   . Allergy   . Asthma   . Bronchitis   . Cholelithiasis 06-20-2011   [place]   . Diarrhea   . GERD (gastroesophageal reflux disease)   . Hyperlipidemia   . Hypertension   . Hypothyroidism   . Migraines     . Personal history of radiation therapy 2018  . PONV (postoperative nausea and vomiting)   . Thyroid disease    hypothyroid  . Ulcer     Past Surgical History:  Procedure Laterality Date  . ABDOMINAL HYSTERECTOMY    . BREAST LUMPECTOMY Left 03/03/2017  . BREAST LUMPECTOMY WITH RADIOACTIVE SEED AND SENTINEL LYMPH NODE BIOPSY Left 03/03/2017   Procedure: LEFT BREAST LUMPECTOMY WITH RADIOACTIVE SEED X2 AND SENTINEL LYMPH NODE BIOPSY;  Surgeon: Fanny Skates, MD;  Location: [facility];  Service: General;  Laterality: Left;  . CHOLECYSTECTOMY  12/25/2011   Procedure: LAPAROSCOPIC CHOLECYSTECTOMY WITH INTRAOPERATIVE CHOLANGIOGRAM;  Surgeon: Earnstine Regal, MD;  Location: [facility];  Service: General;  Laterality: N/A;  laparoscopic cholecystectomy with intraoperative cholangiogram  . COLONOSCOPY  01/10/2003   internal hemorrhoids (Dr. Lajoyce Corners)  . FOOT SURGERY     right  . hysterectomy    . ruptured disk     in neck  . TONSILLECTOMY    . UPPER GASTROINTESTINAL ENDOSCOPY  01/10/2003   hiatal hernia (Dr. Lajoyce Corners)     reports that she has never smoked. She has never used smokeless tobacco. She reports that she drinks alcohol. She reports that she does not use drugs.  Allergies  Allergen Reactions  . Lisinopril Cough  . Percodan [Oxycodone-Aspirin] Nausea And Vomiting and Other (See Comments)    Sweating, unable to sleep  . Prednisone Other (See Comments)    Makes pt sweat, unable to sleep  . Amoxicillin-Pot Clavulanate Rash    Has patient had a PCN reaction causing immediate rash, facial/tongue/throat swelling, SOB or lightheadedness with hypotension: Unknown Has patient had a PCN reaction causing severe rash involving mucus membranes or skin necrosis: Unknown Has patient had a PCN reaction that required hospitalization: Unknown Has patient had a PCN reaction occurring within the last 10 years: No If all of the above answers are "NO", then may proceed with Cephalosporin use.      Family History  Problem Relation Age of Onset  . Asthma Father   . Colon cancer Neg Hx   . Stomach cancer Neg Hx   . Esophageal cancer Neg Hx   . Rectal cancer Neg Hx     Prior to Admission medications   Medication Sig Start Date End Date Taking? Authorizing Provider  acetaminophen (TYLENOL) 500 MG tablet Take 1,000 mg by mouth every 6 (six) hours as needed for mild pain or headache.   Yes [provider]  anastrozole (ARIMIDEX) 1 MG tablet TAKE 1 TABLET BY MOUTH ONCE DAILY Patient taking differently: Take 1 mg by mouth daily.  09/29/18  Yes Magrinat, Virgie Dad, MD  atorvastatin (LIPITOR) 10 MG tablet Take 10 mg by mouth daily.    Yes [provider]  dextromethorphan (DELSYM) 30 MG/5ML liquid Take 30 mg by mouth 2 (two) times daily as needed for cough.   Yes [provider]  furosemide (LASIX) 40 MG tablet Take 1 tablet (40 mg total) by mouth daily. 09/28/18 10/28/18 Yes Arrien, Jimmy Picket, MD  levothyroxine (SYNTHROID, LEVOTHROID) 88 MCG tablet Take 88 mcg by mouth every morning.    Yes [provider]  loratadine (CLARITIN) 10 MG tablet Take 10 mg by mouth daily.   Yes [provider]  metoprolol succinate (TOPROL-XL) 25 MG 24 hr tablet Take 1 tablet (25 mg total) by mouth daily. 09/28/18 10/28/18 Yes Arrien, Jimmy Picket, MD  mometasone-formoterol [facility]) 100-5 MCG/ACT AERO Inhale 2 puffs into the lungs daily as needed for wheezing.   Yes [provider]  sacubitril-valsartan (ENTRESTO) 97-103 MG Take 1 tablet by mouth 2 (two) times daily. 10/01/18  Yes Jola Schmidt, MD  spironolactone (ALDACTONE) 25 MG tablet Take 1 tablet (25 mg total) by mouth daily. 10/01/18  Yes Jola Schmidt, MD  anastrozole (ARIMIDEX) 1 MG tablet TAKE 1 TABLET BY MOUTH ONCE DAILY Patient not taking: Reported on 10/01/2018 06/09/18   Magrinat, Virgie Dad, MD  apixaban (ELIQUIS) 5 MG TABS tablet Take 1 tablet (5 mg total) by mouth 2 (two) times daily.  10/08/18 11/07/18  Virgel Manifold, MD    Physical Exam: Vitals:   10/08/18 1826 10/08/18 1829 10/08/18 2039 10/08/18 2158  BP: 116/76  (!) 144/121 117/90  Pulse: 60  (!) 115 (!) 108  Resp: 16  (!) 24 20  Temp: 97.6 F (36.4 C)   98.5 F (36.9 C)  TempSrc: Oral     SpO2: 99%  99% 97%  Weight:  79.8 kg    Height:  5\' 3"  (1.6 m)      Constitutional: NAD, calm, comfortable Vitals:   10/08/18 1826 10/08/18 1829 10/08/18 2039 10/08/18 2158  BP: 116/76  (!) 144/121 117/90  Pulse: 60  (!) 115 (!) 108  Resp:  16  (!) 24 20  Temp: 97.6 F (36.4 C)   98.5 F (36.9 C)  TempSrc: Oral     SpO2: 99%  99% 97%  Weight:  79.8 kg    Height:  5\' 3"  (1.6 m)     Eyes: PERRL, lids and conjunctivae normal ENMT: Mucous membranes are moist. Respiratory:  decr bases , no wheezing, no crackles. Normal respiratory effort. No accessory muscle use.  Cardiovascular: tachy  rate and REG  rhythm, . No extremity edema. 1+ pedal pulses.  Abdomen: no tenderness, no masses palpated. No hepatosplenomegaly. Bowel sounds positive.  Musculoskeletal: no clubbing / cyanosis.  Skin: no rashes, lesions, ulcers. No induration Neurologic: CN 2-12 grossly intact.  Strength 5/5 in all 4.  Psychiatric: Normal judgment and insight. Alert and oriented x 3. Normal mood.   Labs on Admission: I have personally reviewed following labs and imaging studies  CBC: Recent Labs  Lab 10/08/18 1012 10/08/18 1902  WBC 8.5 8.3  NEUTROABS 5.8 5.2  HGB 14.7 14.8  HCT 47.1* 46.7*  MCV 97.3 97.5  PLT 206 086   Basic Metabolic Panel: Recent Labs  Lab 10/08/18 1012 10/08/18 1902  NA 138 139  K 3.8 4.0  CL 107 107  CO2 21* 21*  GLUCOSE 134* 110*  BUN 23 23  CREATININE 1.26* 1.23*  CALCIUM 9.0 8.9  MG 2.0 1.8   GFR: Estimated Creatinine Clearance: 37.7 mL/min (A) (by C-G formula based on SCr of 1.23 mg/dL (H)). Liver Function Tests: Recent Labs  Lab 10/08/18 1902  AST 25  ALT 23  ALKPHOS 47  BILITOT 0.9  PROT  6.6  ALBUMIN 3.6   No results for input(s): LIPASE, AMYLASE in the last 168 hours. No results for input(s): AMMONIA in the last 168 hours. Coagulation Profile: No results for input(s): INR, PROTIME in the last 168 hours. Cardiac Enzymes: No results for input(s): CKTOTAL, CKMB, CKMBINDEX, TROPONINI in the last 168 hours. BNP (last 3 results) No results for input(s): PROBNP in the last 8760 hours. HbA1C: No results for input(s): HGBA1C in the last 72 hours. CBG: No results for input(s): GLUCAP in the last 168 hours. Lipid Profile: No results for input(s): CHOL, HDL, LDLCALC, TRIG, CHOLHDL, LDLDIRECT in the last 72 hours. Thyroid Function Tests: Recent Labs    10/08/18 1012  TSH 6.152*   Anemia Panel: No results for input(s): VITAMINB12, FOLATE, FERRITIN, TIBC, IRON, RETICCTPCT in the last 72 hours. Urine analysis:    Component Value Date/Time   COLORURINE YELLOW 12/23/2011 1436   APPEARANCEUR CLOUDY (A) 12/23/2011 1436   LABSPEC 1.017 12/23/2011 1436   PHURINE 7.5 12/23/2011 1436   GLUCOSEU NEGATIVE 12/23/2011 1436   HGBUR NEGATIVE 12/23/2011 1436   BILIRUBINUR NEGATIVE 12/23/2011 1436   KETONESUR NEGATIVE 12/23/2011 1436   PROTEINUR NEGATIVE 12/23/2011 1436   UROBILINOGEN 0.2 12/23/2011 1436   NITRITE NEGATIVE 12/23/2011 1436   LEUKOCYTESUR NEGATIVE 12/23/2011 1436    Radiological Exams on Admission: Dg Chest 2 View  Result Date: 10/08/2018 CLINICAL DATA:  Diagnosis from the emergency department with new diagnosis of atrial fibrillation today. Increasing shortness of breath and fatigue at home. EXAM: CHEST - 2 VIEW COMPARISON:  10/08/2018 and 10/01/2018 radiographs. FINDINGS: 1928 hours. Stable cardiomegaly, tiny bilateral pleural effusions and mild interstitial prominence. There is no invert pulmonary edema, confluent airspace opacity or pneumothorax. Telemetry leads overlie the chest. No acute osseous findings. IMPRESSION: Stable chest with cardiomegaly and trace  bilateral pleural effusions. Electronically Signed   By: Gwyndolyn Saxon  Lin Landsman M.D.   On: 10/08/2018 19:47   Dg Chest 2 View  Result Date: 10/08/2018 CLINICAL DATA:  Fatigue and shortness of breath. EXAM: CHEST - 2 VIEW COMPARISON:  10/01/2018 and prior radiographs FINDINGS: Cardiomegaly again noted. Trace bilateral pleural effusions are again noted. There is no evidence of focal airspace disease, pulmonary edema, suspicious pulmonary nodule/mass or pneumothorax. No acute bony abnormalities are identified. IMPRESSION: Cardiomegaly with trace bilateral pleural effusions. No significant change. Electronically Signed   By: Margarette Canada M.D.   On: 10/08/2018 10:31    EKG: Independently reviewed. ST NCIVCD prolonged qtc   Assessment/Plan Principal Problem:   Acute on chronic systolic CHF (congestive heart failure) ([place]) echo done September 25, 2018 EF of 20 to 25% severe mitral regurgitation dilated left atrium right atrium and right ventricle moderate to severe tricuspid regurg Active Problems:   Hypothyroidism   Essential hypertension   Malignant neoplasm of upper-outer quadrant of left breast in female, estrogen receptor positive ([place])   New onset a-fib ([place])   Now in sinus rhythm but still tachycardic.  I suspect her shortness of breath is multifactorial from mild volume overload and uncontrolled heart rate.  We will give a small dose of IV Lasix and resume her p.o. Lasix tomorrow.  We will also give her a small dose of IV metoprolol and restart her home metoprolol tomorrow.  She may need his rate adjusted.  Continue daily weights low-sodium diet strict I's and O's.  Give her dose of her Eliquis tonight Continue home Entresto and Aldactone Continue home her anastrozole Creatinine stable today at 1.23.  Her last admission she was as high as 2.2.  Upon discharge she was 1.7. Continue home meds for hypothyroidism, hypertension   DVT prophylaxis: Eliquis Code Status: Full Disposition Plan: home 1-2  days   Admission status: obs tele   Elizabeht Suto Johnson-Pitts MD [facility] Pager [phone]  If 7PM-7AM, please contact night-coverage [email or web address] Password [facility]  10/08/2018, 10:26 PM

## 2018-10-08 NOTE — ED Provider Notes (Signed)
[facility] DEPT Provider Note   CSN: [id number] Arrival date & time: 10/08/18  4401     History   Chief Complaint Chief Complaint  Patient presents with  . Shortness of Breath    HPI Erin Novak is a 77 y.o. female.  HPI   77 year old female with a history of congestive heart failure who was recently discharged from the hospital for mild CHF exacerbation with IV diuresis in the hospital.  Her Entresto and Aldactone was stopped secondary to renal insufficiency.  She was seen again in the emergency room after discharge with dyspnea.  Her Entresto and Aldactone resumed.  She reports that her breathing and swelling have been improving up until about 2 days ago.  She began feeling increasing shortness of breath.  Intermittent palpitations.  No acute pain.  She reports compliance with her medications and dietary restrictions.  No fevers or chills.  No cough.   Past Medical History:  Diagnosis Date  . Abdominal pain   . Allergy   . Asthma   . Bronchitis   . Cholelithiasis 06-20-2011   [place]   . Diarrhea   . GERD (gastroesophageal reflux disease)   . Hyperlipidemia   . Hypertension   . Hypothyroidism   . Migraines   . Personal history of radiation therapy 2018  . PONV (postoperative nausea and vomiting)   . Thyroid disease    hypothyroid  . Ulcer     Patient Active Problem List   Diagnosis Date Noted  . CHF (congestive heart failure) ([place]) 09/24/2018  . Hypokalemia 09/24/2018  . Post-operative nausea and vomiting 03/03/2017  . Malignant neoplasm of upper-outer quadrant of left breast in female, estrogen receptor positive ([place]) 01/28/2017  . Cholelithiasis without obstruction 07/15/2011  . Gallstones 07/10/2011  . GERD (gastroesophageal reflux disease) 07/10/2011  . Obesity 07/10/2011  . 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 Hypothyroidism 02/07/2008  . HYPERLIPIDEMIA 02/07/2008  . Essential hypertension 02/07/2008  . Asthma 02/07/2008    Past  Surgical History:  Procedure Laterality Date  . ABDOMINAL HYSTERECTOMY    . BREAST LUMPECTOMY Left 03/03/2017  . BREAST LUMPECTOMY WITH RADIOACTIVE SEED AND SENTINEL LYMPH NODE BIOPSY Left 03/03/2017   Procedure: LEFT BREAST LUMPECTOMY WITH RADIOACTIVE SEED X2 AND SENTINEL LYMPH NODE BIOPSY;  Surgeon: Fanny Skates, MD;  Location: [facility];  Service: General;  Laterality: Left;  . CHOLECYSTECTOMY  12/25/2011   Procedure: LAPAROSCOPIC CHOLECYSTECTOMY WITH INTRAOPERATIVE CHOLANGIOGRAM;  Surgeon: Earnstine Regal, MD;  Location: [facility];  Service: General;  Laterality: N/A;  laparoscopic cholecystectomy with intraoperative cholangiogram  . COLONOSCOPY  01/10/2003   internal hemorrhoids (Dr. Lajoyce Corners)  . FOOT SURGERY     right  . hysterectomy    . ruptured disk     in neck  . TONSILLECTOMY    . UPPER GASTROINTESTINAL ENDOSCOPY  01/10/2003   hiatal hernia (Dr. Lajoyce Corners)     OB History   None      Home Medications    Prior to Admission medications   Medication Sig Start Date End Date Taking? Authorizing Provider  albuterol (PROVENTIL HFA;VENTOLIN HFA) 108 (90 Base) MCG/ACT inhaler Inhale 1-2 puffs into the lungs every 6 (six) hours as needed for wheezing or shortness of breath.    [provider]  anastrozole (ARIMIDEX) 1 MG tablet TAKE 1 TABLET BY MOUTH ONCE DAILY Patient not taking: Reported on 10/01/2018 06/09/18   Magrinat, Virgie Dad, MD  anastrozole (ARIMIDEX) 1 MG tablet TAKE 1 TABLET BY  MOUTH ONCE DAILY 09/29/18   Magrinat, Virgie Dad, MD  atorvastatin (LIPITOR) 10 MG tablet Take 10 mg by mouth daily.     [provider]  dextromethorphan (DELSYM) 30 MG/5ML liquid Take 30 mg by mouth 2 (two) times daily as needed for cough.    [provider]  furosemide (LASIX) 40 MG tablet Take 1 tablet (40 mg total) by mouth daily. 09/28/18 10/28/18  Arrien, Jimmy Picket, MD  levothyroxine (SYNTHROID, LEVOTHROID) 88 MCG tablet Take 88 mcg by mouth every morning.      [provider]  loratadine (CLARITIN) 10 MG tablet Take 10 mg by mouth daily.    [provider]  metoprolol succinate (TOPROL-XL) 25 MG 24 hr tablet Take 1 tablet (25 mg total) by mouth daily. 09/28/18 10/28/18  Arrien, Jimmy Picket, MD  mometasone-formoterol (DULERA) 100-5 MCG/ACT AERO Inhale 2 puffs into the lungs daily as needed for wheezing.    [provider]  sacubitril-valsartan (ENTRESTO) 97-103 MG Take 1 tablet by mouth 2 (two) times daily. 10/01/18   Jola Schmidt, MD  spironolactone (ALDACTONE) 25 MG tablet Take 1 tablet (25 mg total) by mouth daily. 10/01/18   Jola Schmidt, MD    Family History Family History  Problem Relation Age of Onset  . Asthma Father   . Colon cancer Neg Hx   . Stomach cancer Neg Hx   . Esophageal cancer Neg Hx   . Rectal cancer Neg Hx     Social History Social History   Tobacco Use  . Smoking status: Never Smoker  . Smokeless tobacco: Never Used  Substance Use Topics  . Alcohol use: Yes    Comment: rare  . Drug use: No     Allergies   Lisinopril; Percodan [oxycodone-aspirin]; Prednisone; and Amoxicillin-pot clavulanate   Review of Systems Review of Systems  All systems reviewed and negative, other than as noted in HPI.  Physical Exam Updated Vital Signs BP 108/77 (BP Location: Right Arm)   Pulse (!) 145   Resp 20   SpO2 99%   Physical Exam  Constitutional: She appears well-developed and well-nourished. No distress.  HENT:  Head: Normocephalic and atraumatic.  Eyes: Conjunctivae are normal. Right eye exhibits no discharge. Left eye exhibits no discharge.  Neck: Neck supple.  Cardiovascular: Normal heart sounds. Exam reveals no gallop and no friction rub.  No murmur heard. Tachycardic.  Irregularly irregular.  Pulmonary/Chest: Effort normal and breath sounds normal. No respiratory distress.  Abdominal: Soft. She exhibits no distension. There is no tenderness.  Musculoskeletal: She exhibits no  edema or tenderness.  Neurological: She is alert.  Skin: Skin is warm and dry.  Psychiatric: She has a normal mood and affect. Her behavior is normal. Thought content normal.  Nursing note and vitals reviewed.    ED Treatments / Results  Labs (all labs ordered are listed, but only abnormal results are displayed) Labs Reviewed  CBC WITH DIFFERENTIAL/PLATELET - Abnormal; Notable for the following components:      Result Value   HCT 47.1 (*)    All other components within normal limits  BASIC METABOLIC PANEL - Abnormal; Notable for the following components:   CO2 21 (*)    Glucose, Bld 134 (*)    Creatinine, Ser 1.26 (*)    GFR calc non Af Amer 40 (*)    GFR calc Af Amer 46 (*)    All other components within normal limits  TSH - Abnormal; Notable for the following components:  TSH 6.152 (*)    All other components within normal limits  BRAIN NATRIURETIC PEPTIDE - Abnormal; Notable for the following components:   B Natriuretic Peptide 2,876.6 (*)    All other components within normal limits  MAGNESIUM    EKG EKG Interpretation  Date/Time:  Friday October 08 2018 09:56:16 EST Ventricular Rate:  144 PR Interval:    QRS Duration: 160 QT Interval:  343 QTC Calculation: 531 R Axis:   -158 Text Interpretation:  Atrial fibrillation Left bundle branch block Confirmed by Virgel Manifold [phone]) on 10/08/2018 10:10:14 AM   Radiology Dg Chest 2 View  Result Date: 10/08/2018 CLINICAL DATA:  Fatigue and shortness of breath. EXAM: CHEST - 2 VIEW COMPARISON:  10/01/2018 and prior radiographs FINDINGS: Cardiomegaly again noted. Trace bilateral pleural effusions are again noted. There is no evidence of focal airspace disease, pulmonary edema, suspicious pulmonary nodule/mass or pneumothorax. No acute bony abnormalities are identified. IMPRESSION: Cardiomegaly with trace bilateral pleural effusions. No significant change. Electronically Signed   By: Margarette Canada M.D.   On: 10/08/2018 10:31     Procedures Procedures (including critical care time)  Medications Ordered in ED Medications  albuterol (PROVENTIL) (2.5 MG/3ML) 0.083% nebulizer solution 5 mg (has no administration in time range)  diltiazem (CARDIZEM) 1 mg/mL load via infusion 10 mg (has no administration in time range)    And  diltiazem (CARDIZEM) 100 mg in dextrose 5% 143mL (1 mg/mL) infusion (has no administration in time range)     Initial Impression / Assessment and Plan / ED Course  I have reviewed the triage vital signs and the nursing notes.  Pertinent labs & imaging results that were available during my care of the patient were reviewed by me and considered in my medical decision making (see chart for details).     77yF with dyspnea. New onset afib. Known LBBB.   A. fib is new diagnosis.  Not clear of exact onset based on her symptoms.  She reports intermittent palpitations for the past 2 days.  She denies it currently although she has a HR of 140-160.  She does not appear overtly volume overloaded which has been a recent issue.    Suspect her symptoms are more related to the A. fib with RVR.  She was started on a Cardizem drip.  CHADSVASC of 5. Age, female, HTN, CHF.   CXR unchanged from most recent.   Eliquis. Cardiology FU.   Final Clinical Impressions(s) / ED Diagnoses   Final diagnoses:  New onset a-fib [facility])    ED Discharge Orders         Ordered    apixaban (ELIQUIS) 5 MG TABS tablet  2 times daily     10/08/18 1302           Virgel Manifold, MD 10/18/18 [phone]

## 2018-10-08 NOTE — ED Notes (Signed)
Pt ambulate around the department and back to the room 97% and 94% when return to room

## 2018-10-09 DIAGNOSIS — I480 Paroxysmal atrial fibrillation: Secondary | ICD-10-CM | POA: Diagnosis not present

## 2018-10-09 DIAGNOSIS — I081 Rheumatic disorders of both mitral and tricuspid valves: Secondary | ICD-10-CM | POA: Diagnosis not present

## 2018-10-09 DIAGNOSIS — I5043 Acute on chronic combined systolic (congestive) and diastolic (congestive) heart failure: Secondary | ICD-10-CM | POA: Diagnosis not present

## 2018-10-09 DIAGNOSIS — I5023 Acute on chronic systolic (congestive) heart failure: Secondary | ICD-10-CM | POA: Diagnosis not present

## 2018-10-09 LAB — BASIC METABOLIC PANEL
Anion gap: 11 (ref 5–15)
BUN: 27 mg/dL — ABNORMAL HIGH (ref 8–23)
CALCIUM: 9.1 mg/dL (ref 8.9–10.3)
CHLORIDE: 107 mmol/L (ref 98–111)
CO2: 21 mmol/L — AB (ref 22–32)
CREATININE: 1.27 mg/dL — AB (ref 0.44–1.00)
GFR calc non Af Amer: 39 mL/min — ABNORMAL LOW (ref >60)
GFR, EST AFRICAN AMERICAN: 46 mL/min — AB (ref >60)
GLUCOSE: 126 mg/dL — AB (ref 70–99)
Potassium: 3.9 mmol/L (ref 3.5–5.1)
Sodium: 139 mmol/L (ref 135–145)

## 2018-10-09 LAB — BRAIN NATRIURETIC PEPTIDE: B Natriuretic Peptide: 3381.4 pg/mL — ABNORMAL HIGH (ref 0.0–100.0)

## 2018-10-09 MED ORDER — METOCLOPRAMIDE HCL 5 MG/ML IJ SOLN
10.0000 mg | Freq: Once | INTRAMUSCULAR | Status: AC
  Administered 2018-10-09: 10 mg via INTRAVENOUS
  Filled 2018-10-09: qty 2

## 2018-10-09 MED ORDER — ONDANSETRON HCL 4 MG/5ML PO SOLN
4.0000 mg | Freq: Once | ORAL | Status: AC
  Administered 2018-10-09: 4 mg via ORAL
  Filled 2018-10-09: qty 5

## 2018-10-09 MED ORDER — METOPROLOL TARTRATE 5 MG/5ML IV SOLN
5.0000 mg | INTRAVENOUS | Status: AC | PRN
  Administered 2018-10-09 (×2): 5 mg via INTRAVENOUS
  Filled 2018-10-09 (×2): qty 5

## 2018-10-09 MED ORDER — AMIODARONE HCL 200 MG PO TABS: 200.0000 mg | ORAL_TABLET | Freq: Two times a day (BID) | ORAL | Status: DC

## 2018-10-09 MED ORDER — AMIODARONE HCL 200 MG PO TABS
200.0000 mg | ORAL_TABLET | Freq: Two times a day (BID) | ORAL | Status: DC
  Administered 2018-10-09 – 2018-10-13 (×8): 200 mg via ORAL
  Filled 2018-10-09 (×8): qty 1

## 2018-10-09 NOTE — Plan of Care (Signed)
  Problem: Activity: Goal: Risk for activity intolerance will decrease Outcome: Progressing   

## 2018-10-09 NOTE — Progress Notes (Signed)
PROGRESS NOTE    Erin Novak  OHY:[id number] DOB: 10-Jun-1941 DOA: 10/08/2018 PCP: [name], MD  Outpatient Specialists:   Brief Narrative:  Patient is a 77 year old Caucasian female, with past medical history significant for chronic systolic congestive heart failure with EF of 20 to 25% and newly diagnosed atrial fibrillation.  Last echo was on 09/25/2018.  Echocardiogram revealed EF of 20 to 25%, diffuse hypokinesis, left ventricular diastolic function could not be assessed, severe mitral regurgitation and PA peak pressure 47 mmHg.  Patient was admitted with CHF exacerbation.  Patient seems to be improving.  Will consult the cardiology team.   Assessment & Plan:   Principal Problem:   Acute on chronic systolic CHF (congestive heart failure) ([place]) Active Problems:   Hypothyroidism   Essential hypertension   Malignant neoplasm of upper-outer quadrant of left breast in female, estrogen receptor positive ([place])   New onset a-fib ([place])   Acute on chronic systolic CHF: Echo done September 25, 2018 is as documented above. Daily weights low-sodium diet strict I's and O's.  Give her dose of her Eliquis tonight Continue home Entresto and Aldactone Cardiology consult  New onset a-fib: Cardiology consult as patient may need further work-up. Rate controlled on anticoagulation.  Hypothyroidism  Essential hypertension: Continue to optimize. Monitor closely.  Malignant neoplasm of upper-outer quadrant of left breast in female, estrogen receptor positive   Continue home her anastrozole  DVT prophylaxis: Eliquis Code Status: Full Disposition Plan: home 1-2 days    Consultants:   Cardiology  Procedures:   None  Antimicrobials:   None   Subjective: Shortness of breath has improved significantly. No chest pain.  Objective: Vitals:   10/08/18 2158 10/09/18 0655 10/09/18 1426 10/09/18 1955  BP: 117/90 (!) 131/108 105/85 94/75  Pulse: (!) 108 (!) 111 96 95  Resp: 20 16  (!) 22 18  Temp: 98.5 F (36.9 C) 97.7 F (36.5 C) (!) 97.5 F (36.4 C) 98.1 F (36.7 C)  TempSrc: Oral Oral  Oral  SpO2: 97% 97% 97% 94%  Weight: 78.8 kg     Height: 5\' 3"  (1.6 m)       Intake/Output Summary (Last 24 hours) at 10/09/2018 2158 Last data filed at 10/09/2018 0600 Gross per 24 hour  Intake -  Output 450 ml  Net -450 ml   Filed Weights   10/08/18 1829 10/08/18 2158  Weight: 79.8 kg 78.8 kg    Examination:  General exam: Appears calm and comfortable  Respiratory system: Clear to auscultation.  Cardiovascular system: S1 & S2.   Gastrointestinal system: Abdomen is nondistended, soft and nontender. No organomegaly or masses felt. Normal bowel sounds heard. Central nervous system: Alert and oriented. No focal neurological deficits. Extremities: No leg edema.  Data Reviewed: I have personally reviewed following labs and imaging studies  CBC: Recent Labs  Lab 10/08/18 1012 10/08/18 1902  WBC 8.5 8.3  NEUTROABS 5.8 5.2  HGB 14.7 14.8  HCT 47.1* 46.7*  MCV 97.3 97.5  PLT 206 948   Basic Metabolic Panel: Recent Labs  Lab 10/08/18 1012 10/08/18 1902 10/09/18 0437  NA 138 139 139  K 3.8 4.0 3.9  CL 107 107 107  CO2 21* 21* 21*  GLUCOSE 134* 110* 126*  BUN 23 23 27*  CREATININE 1.26* 1.23* 1.27*  CALCIUM 9.0 8.9 9.1  MG 2.0 1.8  --    GFR: Estimated Creatinine Clearance: 36.3 mL/min (A) (by C-G formula based on SCr of 1.27 mg/dL (H)). Liver Function Tests:  Recent Labs  Lab 10/08/18 1902  AST 25  ALT 23  ALKPHOS 47  BILITOT 0.9  PROT 6.6  ALBUMIN 3.6   No results for input(s): LIPASE, AMYLASE in the last 168 hours. No results for input(s): AMMONIA in the last 168 hours. Coagulation Profile: No results for input(s): INR, PROTIME in the last 168 hours. Cardiac Enzymes: No results for input(s): CKTOTAL, CKMB, CKMBINDEX, TROPONINI in the last 168 hours. BNP (last 3 results) No results for input(s): PROBNP in the last 8760  hours. HbA1C: No results for input(s): HGBA1C in the last 72 hours. CBG: No results for input(s): GLUCAP in the last 168 hours. Lipid Profile: No results for input(s): CHOL, HDL, LDLCALC, TRIG, CHOLHDL, LDLDIRECT in the last 72 hours. Thyroid Function Tests: Recent Labs    10/08/18 1012  TSH 6.152*   Anemia Panel: No results for input(s): VITAMINB12, FOLATE, FERRITIN, TIBC, IRON, RETICCTPCT in the last 72 hours. Urine analysis:    Component Value Date/Time   COLORURINE YELLOW 12/23/2011 1436   APPEARANCEUR CLOUDY (A) 12/23/2011 1436   LABSPEC 1.017 12/23/2011 1436   PHURINE 7.5 12/23/2011 1436   GLUCOSEU NEGATIVE 12/23/2011 1436   HGBUR NEGATIVE 12/23/2011 1436   BILIRUBINUR NEGATIVE 12/23/2011 1436   KETONESUR NEGATIVE 12/23/2011 1436   PROTEINUR NEGATIVE 12/23/2011 1436   UROBILINOGEN 0.2 12/23/2011 1436   NITRITE NEGATIVE 12/23/2011 1436   LEUKOCYTESUR NEGATIVE 12/23/2011 1436   Sepsis Labs: @LABRCNTIP (procalcitonin:4,lacticidven:4)  )No results found for this or any previous visit (from the past 240 hour(s)).       Radiology Studies: Dg Chest 2 View  Result Date: 10/08/2018 CLINICAL DATA:  Diagnosis from the emergency department with new diagnosis of atrial fibrillation today. Increasing shortness of breath and fatigue at home. EXAM: CHEST - 2 VIEW COMPARISON:  10/08/2018 and 10/01/2018 radiographs. FINDINGS: 1928 hours. Stable cardiomegaly, tiny bilateral pleural effusions and mild interstitial prominence. There is no invert pulmonary edema, confluent airspace opacity or pneumothorax. Telemetry leads overlie the chest. No acute osseous findings. IMPRESSION: Stable chest with cardiomegaly and trace bilateral pleural effusions. Electronically Signed   By: Richardean Sale M.D.   On: 10/08/2018 19:47   Dg Chest 2 View  Result Date: 10/08/2018 CLINICAL DATA:  Fatigue and shortness of breath. EXAM: CHEST - 2 VIEW COMPARISON:  10/01/2018 and prior radiographs FINDINGS:  Cardiomegaly again noted. Trace bilateral pleural effusions are again noted. There is no evidence of focal airspace disease, pulmonary edema, suspicious pulmonary nodule/mass or pneumothorax. No acute bony abnormalities are identified. IMPRESSION: Cardiomegaly with trace bilateral pleural effusions. No significant change. Electronically Signed   By: Margarette Canada M.D.   On: 10/08/2018 10:31        Scheduled Meds: . amiodarone  200 mg Oral BID  . anastrozole  1 mg Oral Daily  . atorvastatin  10 mg Oral Daily  . furosemide  40 mg Oral Daily  . levothyroxine  88 mcg Oral BH-q7a  . loratadine  10 mg Oral Daily  . metoprolol succinate  25 mg Oral Daily  . sacubitril-valsartan  1 tablet Oral BID  . spironolactone  25 mg Oral Daily   Continuous Infusions:   LOS: 0 days    Time spent: 35 minutes.    Dana Allan, MD  [facility] Pager #: [phone] 7PM-7AM contact night coverage as above

## 2018-10-09 NOTE — Consult Note (Signed)
Reason for Consult:Afib, shortness of breath Referring Physician: [facility]  Erin Novak is an 77 y.o. female.  HPI:   77 y/o Caucasian female w/presumed NICM EF 20%, hypertension, hyperlipidemia, h/o breast cancer s/p radiation therapy 2018, now with recurrent hospital admissions with shortness of breath.   Patient was recently discharged with similar hospital admission, complicated by AKI that resolved. She was treated with IV diuresis. She was in the ED on 10/08/18 with shortness of breath, found to be in Afib. She was discharged from the ED with eliquis. However, she returned to the ED soon thereafter with NYHA class III-IV dyspnea symptoms.    Past Medical History:  Diagnosis Date  . Abdominal pain   . Allergy   . Asthma   . Bronchitis   . Cholelithiasis 06-20-2011   [place]   . Diarrhea   . GERD (gastroesophageal reflux disease)   . Hyperlipidemia   . Hypertension   . Hypothyroidism   . Migraines   . Personal history of radiation therapy 2018  . PONV (postoperative nausea and vomiting)   . Thyroid disease    hypothyroid  . Ulcer     Past Surgical History:  Procedure Laterality Date  . ABDOMINAL HYSTERECTOMY    . BREAST LUMPECTOMY Left 03/03/2017  . BREAST LUMPECTOMY WITH RADIOACTIVE SEED AND SENTINEL LYMPH NODE BIOPSY Left 03/03/2017   Procedure: LEFT BREAST LUMPECTOMY WITH RADIOACTIVE SEED X2 AND SENTINEL LYMPH NODE BIOPSY;  Surgeon: Fanny Skates, MD;  Location: [facility];  Service: General;  Laterality: Left;  . CHOLECYSTECTOMY  12/25/2011   Procedure: LAPAROSCOPIC CHOLECYSTECTOMY WITH INTRAOPERATIVE CHOLANGIOGRAM;  Surgeon: Earnstine Regal, MD;  Location: [facility];  Service: General;  Laterality: N/A;  laparoscopic cholecystectomy with intraoperative cholangiogram  . COLONOSCOPY  01/10/2003   internal hemorrhoids (Dr. Lajoyce Corners)  . FOOT SURGERY     right  . hysterectomy    . ruptured disk     in neck  . TONSILLECTOMY    . UPPER GASTROINTESTINAL  ENDOSCOPY  01/10/2003   hiatal hernia (Dr. Lajoyce Corners)    Family History  Problem Relation Age of Onset  . Asthma Father   . Colon cancer Neg Hx   . Stomach cancer Neg Hx   . Esophageal cancer Neg Hx   . Rectal cancer Neg Hx     Social History:  reports that she has never smoked. She has never used smokeless tobacco. She reports that she drinks alcohol. She reports that she does not use drugs.  Allergies:  Allergies  Allergen Reactions  . Lisinopril Cough  . Percodan [Oxycodone-Aspirin] Nausea And Vomiting and Other (See Comments)    Sweating, unable to sleep  . Prednisone Other (See Comments)    Makes pt sweat, unable to sleep  . Amoxicillin-Pot Clavulanate Rash    Has patient had a PCN reaction causing immediate rash, facial/tongue/throat swelling, SOB or lightheadedness with hypotension: Unknown Has patient had a PCN reaction causing severe rash involving mucus membranes or skin necrosis: Unknown Has patient had a PCN reaction that required hospitalization: Unknown Has patient had a PCN reaction occurring within the last 10 years: No If all of the above answers are "NO", then may proceed with Cephalosporin use.     Medications: I have reviewed the patient's current medications.  Results for orders placed or performed during the hospital encounter of 10/08/18 (from the past 48 hour(s))  Magnesium     Status: None   Collection Time: 10/08/18  7:02 PM  Result  Value Ref Range   Magnesium 1.8 1.7 - 2.4 mg/dL    Comment: Performed at [facility], [place] [address] [place], [place] [address]  CBC with Differential     Status: Abnormal   Collection Time: 10/08/18  7:02 PM  Result Value Ref Range   WBC 8.3 4.0 - 10.5 K/uL   RBC 4.79 3.87 - 5.11 MIL/uL   Hemoglobin 14.8 12.0 - 15.0 g/dL   HCT 46.7 (H) 36.0 - 46.0 %   MCV 97.5 80.0 - 100.0 fL   MCH 30.9 26.0 - 34.0 pg   MCHC 31.7 30.0 - 36.0 g/dL   RDW 13.6 11.5 - 15.5 %   Platelets 205 150 - 400 K/uL   nRBC 0.0  0.0 - 0.2 %   Neutrophils Relative % 62 %   Neutro Abs 5.2 1.7 - 7.7 K/uL   Lymphocytes Relative 26 %   Lymphs Abs 2.2 0.7 - 4.0 K/uL   Monocytes Relative 10 %   Monocytes Absolute 0.8 0.1 - 1.0 K/uL   Eosinophils Relative 1 %   Eosinophils Absolute 0.1 0.0 - 0.5 K/uL   Basophils Relative 1 %   Basophils Absolute 0.1 0.0 - 0.1 K/uL   Immature Granulocytes 0 %   Abs Immature Granulocytes 0.03 0.00 - 0.07 K/uL    Comment: Performed at [facility], [place] [address], [place], [place] [address]  Comprehensive metabolic panel     Status: Abnormal   Collection Time: 10/08/18  7:02 PM  Result Value Ref Range   Sodium 139 135 - 145 mmol/L   Potassium 4.0 3.5 - 5.1 mmol/L   Chloride 107 98 - 111 mmol/L   CO2 21 (L) 22 - 32 mmol/L   Glucose, Bld 110 (H) 70 - 99 mg/dL   BUN 23 8 - 23 mg/dL   Creatinine, Ser 1.23 (H) 0.44 - 1.00 mg/dL   Calcium 8.9 8.9 - 10.3 mg/dL   Total Protein 6.6 6.5 - 8.1 g/dL   Albumin 3.6 3.5 - 5.0 g/dL   AST 25 15 - 41 U/L   ALT 23 0 - 44 U/L   Alkaline Phosphatase 47 38 - 126 U/L   Total Bilirubin 0.9 0.3 - 1.2 mg/dL   GFR calc non Af Amer 41 (L) >60 mL/min   GFR calc Af Amer 47 (L) >60 mL/min    Comment: (NOTE) The eGFR has been calculated using the CKD EPI equation. This calculation has not been validated in all clinical situations. eGFR's persistently <60 mL/min signify possible Chronic Kidney Disease.    Anion gap 11 5 - 15    Comment: Performed at [facility], [place] [address], [place], [place] [address]  I-stat troponin, ED     Status: None   Collection Time: 10/08/18  7:07 PM  Result Value Ref Range   Troponin i, poc 0.01 0.00 - 0.08 ng/mL   Comment 3            Comment: Due to the release kinetics of cTnI, a negative result within the first hours of the onset of symptoms does not rule out myocardial infarction with certainty. If myocardial infarction is still suspected, repeat the test at appropriate  intervals.   Basic metabolic panel     Status: Abnormal   Collection Time: 10/09/18  4:37 AM  Result Value Ref Range   Sodium 139 135 - 145 mmol/L   Potassium 3.9 3.5 - 5.1 mmol/L   Chloride 107 98 -  111 mmol/L   CO2 21 (L) 22 - 32 mmol/L   Glucose, Bld 126 (H) 70 - 99 mg/dL   BUN 27 (H) 8 - 23 mg/dL   Creatinine, Ser 1.27 (H) 0.44 - 1.00 mg/dL   Calcium 9.1 8.9 - 10.3 mg/dL   GFR calc non Af Amer 39 (L) >60 mL/min   GFR calc Af Amer 46 (L) >60 mL/min    Comment: (NOTE) The eGFR has been calculated using the CKD EPI equation. This calculation has not been validated in all clinical situations. eGFR's persistently <60 mL/min signify possible Chronic Kidney Disease.    Anion gap 11 5 - 15    Comment: Performed at [facility], [place] [address], [place], [place] [address]    Dg Chest 2 View  Result Date: 10/08/2018 CLINICAL DATA:  Diagnosis from the emergency department with new diagnosis of atrial fibrillation today. Increasing shortness of breath and fatigue at home. EXAM: CHEST - 2 VIEW COMPARISON:  10/08/2018 and 10/01/2018 radiographs. FINDINGS: 1928 hours. Stable cardiomegaly, tiny bilateral pleural effusions and mild interstitial prominence. There is no invert pulmonary edema, confluent airspace opacity or pneumothorax. Telemetry leads overlie the chest. No acute osseous findings. IMPRESSION: Stable chest with cardiomegaly and trace bilateral pleural effusions. Electronically Signed   By: Richardean Sale M.D.   On: 10/08/2018 19:47   Dg Chest 2 View  Result Date: 10/08/2018 CLINICAL DATA:  Fatigue and shortness of breath. EXAM: CHEST - 2 VIEW COMPARISON:  10/01/2018 and prior radiographs FINDINGS: Cardiomegaly again noted. Trace bilateral pleural effusions are again noted. There is no evidence of focal airspace disease, pulmonary edema, suspicious pulmonary nodule/mass or pneumothorax. No acute bony abnormalities are identified. IMPRESSION: Cardiomegaly with trace  bilateral pleural effusions. No significant change. Electronically Signed   By: Margarette Canada M.D.   On: 10/08/2018 10:31   EKG 10/08/2018: Afib w.RVR w/IVCD I do not see Afib on most other telemetry strips this admission, and predominantly has sinus tachcycardia, first degree AV block, multiform PAC's.   Echocardiogram 09/25/2018: Study Conclusions  - Left ventricle: Wall thickness was increased in a pattern of   moderate LVH. Systolic function was severely reduced. The   estimated ejection fraction was in the range of 20% to 25%.   Diffuse hypokinesis. The study is not technically sufficient to   allow evaluation of LV diastolic function. - Ventricular septum: Septal motion showed abnormal function and   dyssynergy. These changes are consistent with a left bundle   branch block. - Mitral valve: Premature closure of the MV leaflets suggests   elevated LVEDP. There was severe regurgitation directed centrally   and posteriorly. - Left atrium: The atrium was moderately dilated. - Right ventricle: The cavity size was mildly dilated. - Right atrium: The atrium was mildly dilated. - Atrial septum: No defect or patent foramen ovale was identified. - Tricuspid valve: There was moderate-severe regurgitation. - Pulmonary arteries: PA peak pressure: 47 mm Hg (S).  Impressions:  - The right ventricular systolic pressure was increased consistent   with moderate pulmonary hypertension.  Review of Systems  Constitutional: Negative.   HENT: Negative.   Respiratory: Positive for shortness of breath.   Cardiovascular: Negative for palpitations, orthopnea and leg swelling.  Gastrointestinal: Negative for abdominal pain.  Genitourinary: Negative.   Musculoskeletal: Negative.   Skin: Negative.   Neurological: Negative.  Negative for loss of consciousness.  Endo/Heme/Allergies: Does not bruise/bleed easily.  Psychiatric/Behavioral: Negative.   All other systems reviewed and are  negative.  Blood pressure 105/85, pulse 96, temperature (!) 97.5 F (36.4 C), resp. rate (!) 22, height _0  (1.6 m), weight 78.8 kg, SpO2 97 %. Physical Exam  Nursing note and vitals reviewed. Constitutional: She is oriented to person, place, and time. She appears well-developed and well-nourished.  Eyes: Pupils are equal, round, and reactive to light. Conjunctivae are normal.  Neck: JVD present.  Cardiovascular: An irregularly irregular rhythm present. Tachycardia present.  Murmur heard.  Systolic (Mitral and tricuspid area) murmur is present with a grade of 3/6. Respiratory: Effort normal and breath sounds normal. She has no wheezes. She has no rales.  GI: Soft. Bowel sounds are normal. There is no tenderness. There is no rebound.  Musculoskeletal: She exhibits edema (Trace b/l).  Lymphadenopathy:    She has no cervical adenopathy.  Neurological: She is alert and oriented to person, place, and time.  Skin: Skin is warm and dry.  Psychiatric: She has a normal mood and affect.    Assessment/Recommendations:  77 y/o Caucasian female w/NICM EF 20%, severe mitral and tricuspid regurgitation, hypertension, hyperlipidemia, h/o breast cancer s/p radiation therapy 2018, now with paroxysmal Afib  Shortness of breath: Combination of acute on chrinic HFrEF, compounded by severe mitral and tricuspid regurgitation, now with paroxysmal Afib.  CHA2DS2VASc score 5, annual stroke risk 7.2% Her cardiomyopathy is presumed nonischemic given only small area of ischemia on nuclear stress test. However, given her worsening cardiomyopathy and valvular heart disease, it would be important to rule out ischemic etiology. If truly nonischemic cardiomyopathy, could consider cardiac resynchronization therapy as a management option for severe valvular regurgitation. If ischemic cardiomyopathy, may benefit from revascularization. If patient is discharged, could arrange this outpatient, likely on Tuesday 11/26. Given  the possibility of cath in the next few days, would hold eliquis for now, and start after the cath.  In the meantime, recommend amiodarone 200 mg bid (with monitoring of intrinsically prolonged QTc due to her long QRS interval) for rate and rhyhthm control. Avoid diltiazem given her low EF.  Recommend continued heart failure medical therapy with Entresto 97-103 mg bid, spironolactone 25 mg daily, metoprolol succinate 25 mg daily.  Continue lasix 40 mg PO daily.   Manish J Patwardhan 10/09/2018, 6:04 PM   [place], MD [facility] Pager: [phone] Office: [phone] If no answer Cell [phone]

## 2018-10-09 NOTE — Progress Notes (Signed)
Seen by me. Consult note to follow.  Nigel Mormon, MD [facility] Pager: [phone] Office: [phone] If no answer Cell [phone]

## 2018-10-10 DIAGNOSIS — I4891 Unspecified atrial fibrillation: Secondary | ICD-10-CM | POA: Diagnosis not present

## 2018-10-10 DIAGNOSIS — J45909 Unspecified asthma, uncomplicated: Secondary | ICD-10-CM | POA: Diagnosis present

## 2018-10-10 DIAGNOSIS — I480 Paroxysmal atrial fibrillation: Secondary | ICD-10-CM | POA: Diagnosis present

## 2018-10-10 DIAGNOSIS — Z881 Allergy status to other antibiotic agents status: Secondary | ICD-10-CM | POA: Diagnosis not present

## 2018-10-10 DIAGNOSIS — N183 Chronic kidney disease, stage 3 (moderate): Secondary | ICD-10-CM | POA: Diagnosis present

## 2018-10-10 DIAGNOSIS — I5043 Acute on chronic combined systolic (congestive) and diastolic (congestive) heart failure: Secondary | ICD-10-CM | POA: Diagnosis present

## 2018-10-10 DIAGNOSIS — G43909 Migraine, unspecified, not intractable, without status migrainosus: Secondary | ICD-10-CM | POA: Diagnosis present

## 2018-10-10 DIAGNOSIS — Z90710 Acquired absence of both cervix and uterus: Secondary | ICD-10-CM | POA: Diagnosis not present

## 2018-10-10 DIAGNOSIS — Z7901 Long term (current) use of anticoagulants: Secondary | ICD-10-CM | POA: Diagnosis not present

## 2018-10-10 DIAGNOSIS — Z79890 Hormone replacement therapy (postmenopausal): Secondary | ICD-10-CM | POA: Diagnosis not present

## 2018-10-10 DIAGNOSIS — N179 Acute kidney failure, unspecified: Secondary | ICD-10-CM | POA: Diagnosis present

## 2018-10-10 DIAGNOSIS — I10 Essential (primary) hypertension: Secondary | ICD-10-CM | POA: Diagnosis not present

## 2018-10-10 DIAGNOSIS — Z888 Allergy status to other drugs, medicaments and biological substances status: Secondary | ICD-10-CM | POA: Diagnosis not present

## 2018-10-10 DIAGNOSIS — C50412 Malignant neoplasm of upper-outer quadrant of left female breast: Secondary | ICD-10-CM | POA: Diagnosis not present

## 2018-10-10 DIAGNOSIS — I130 Hypertensive heart and chronic kidney disease with heart failure and stage 1 through stage 4 chronic kidney disease, or unspecified chronic kidney disease: Secondary | ICD-10-CM | POA: Diagnosis present

## 2018-10-10 DIAGNOSIS — I509 Heart failure, unspecified: Secondary | ICD-10-CM

## 2018-10-10 DIAGNOSIS — E785 Hyperlipidemia, unspecified: Secondary | ICD-10-CM | POA: Diagnosis present

## 2018-10-10 DIAGNOSIS — K449 Diaphragmatic hernia without obstruction or gangrene: Secondary | ICD-10-CM | POA: Diagnosis present

## 2018-10-10 DIAGNOSIS — Z923 Personal history of irradiation: Secondary | ICD-10-CM | POA: Diagnosis not present

## 2018-10-10 DIAGNOSIS — Z8589 Personal history of malignant neoplasm of other organs and systems: Secondary | ICD-10-CM | POA: Diagnosis not present

## 2018-10-10 DIAGNOSIS — K219 Gastro-esophageal reflux disease without esophagitis: Secondary | ICD-10-CM | POA: Diagnosis present

## 2018-10-10 DIAGNOSIS — Z885 Allergy status to narcotic agent status: Secondary | ICD-10-CM | POA: Diagnosis not present

## 2018-10-10 DIAGNOSIS — E039 Hypothyroidism, unspecified: Secondary | ICD-10-CM | POA: Diagnosis present

## 2018-10-10 DIAGNOSIS — I5023 Acute on chronic systolic (congestive) heart failure: Secondary | ICD-10-CM | POA: Diagnosis not present

## 2018-10-10 DIAGNOSIS — I081 Rheumatic disorders of both mitral and tricuspid valves: Secondary | ICD-10-CM | POA: Diagnosis not present

## 2018-10-10 DIAGNOSIS — Z9049 Acquired absence of other specified parts of digestive tract: Secondary | ICD-10-CM | POA: Diagnosis not present

## 2018-10-10 DIAGNOSIS — Z170 Estrogen receptor positive status [ER+]: Secondary | ICD-10-CM | POA: Diagnosis not present

## 2018-10-10 DIAGNOSIS — I5021 Acute systolic (congestive) heart failure: Secondary | ICD-10-CM | POA: Diagnosis not present

## 2018-10-10 DIAGNOSIS — R0602 Shortness of breath: Secondary | ICD-10-CM | POA: Diagnosis not present

## 2018-10-10 DIAGNOSIS — Z7951 Long term (current) use of inhaled steroids: Secondary | ICD-10-CM | POA: Diagnosis not present

## 2018-10-10 DIAGNOSIS — Z825 Family history of asthma and other chronic lower respiratory diseases: Secondary | ICD-10-CM | POA: Diagnosis not present

## 2018-10-10 DIAGNOSIS — Z853 Personal history of malignant neoplasm of breast: Secondary | ICD-10-CM | POA: Diagnosis not present

## 2018-10-10 LAB — CBC
HCT: 44.4 % (ref 36.0–46.0)
Hemoglobin: 13.8 g/dL (ref 12.0–15.0)
MCH: 30.6 pg (ref 26.0–34.0)
MCHC: 31.1 g/dL (ref 30.0–36.0)
MCV: 98.4 fL (ref 80.0–100.0)
Platelets: 187 10*3/uL (ref 150–400)
RBC: 4.51 MIL/uL (ref 3.87–5.11)
RDW: 13.4 % (ref 11.5–15.5)
WBC: 8.5 10*3/uL (ref 4.0–10.5)
nRBC: 0 % (ref 0.0–0.2)

## 2018-10-10 LAB — BASIC METABOLIC PANEL
ANION GAP: 9 (ref 5–15)
BUN: 31 mg/dL — ABNORMAL HIGH (ref 8–23)
CO2: 23 mmol/L (ref 22–32)
CREATININE: 1.53 mg/dL — AB (ref 0.44–1.00)
Calcium: 8.8 mg/dL — ABNORMAL LOW (ref 8.9–10.3)
Chloride: 107 mmol/L (ref 98–111)
GFR, EST AFRICAN AMERICAN: 36 mL/min — AB (ref >60)
GFR, EST NON AFRICAN AMERICAN: 31 mL/min — AB (ref >60)
Glucose, Bld: 97 mg/dL (ref 70–99)
Potassium: 3.7 mmol/L (ref 3.5–5.1)
SODIUM: 139 mmol/L (ref 135–145)

## 2018-10-10 NOTE — Progress Notes (Signed)
Pt picked up by [facility] @ 2200 and transferred to [facility] E Rm # [id number]. Pt stable.

## 2018-10-10 NOTE — Progress Notes (Signed)
[facility] made aware that pt is here from [facility] long.

## 2018-10-10 NOTE — Progress Notes (Signed)
Subjective:  Remains asymptomatic at rest  BNP increased to 3381 Cr increased from 1.27 to 1.53.  I/O not accurately measured.   Objective:  Vital Signs in the last 24 hours: Temp:  [97.3 F (36.3 C)-98.1 F (36.7 C)] 97.3 F (36.3 C) (11/24 0527) Pulse Rate:  [73-96] 73 (11/24 0527) Resp:  [18-22] 21 (11/24 0527) BP: (94-105)/(71-85) 96/71 (11/24 0527) SpO2:  [94 %-97 %] 96 % (11/24 0527) Weight:  [78.2 kg] 78.2 kg (11/24 0527)  Intake/Output from previous day: 11/23 0701 - 11/24 0700 In: -  Out: 300 [Urine:300] Intake/Output from this shift: No intake/output data recorded.  Physical Exam: Nursing note and vitals reviewed. Constitutional: She is oriented to person, place, and time. She appears well-developed and well-nourished.  Eyes: Pupils are equal, round, and reactive to light. Conjunctivae are normal.  Neck: JVD present.  Cardiovascular: An irregularly irregular rhythm present. Tachycardia present.  Murmur heard.  Systolic (Mitral and tricuspid area) murmur is present with a grade of 3/6. Respiratory: Effort normal and breath sounds normal. She has no wheezes. She has no rales.  GI: Soft. Bowel sounds are normal. There is no tenderness. There is no rebound.  Musculoskeletal: She exhibits edema (Trace b/l).  Lymphadenopathy:    She has no cervical adenopathy.  Neurological: She is alert and oriented to person, place, and time.  Skin: Skin is warm and dry.  Psychiatric: She has a normal mood and affect.     Lab Results: Recent Labs    10/08/18 1902 10/10/18 0447  WBC 8.3 8.5  HGB 14.8 13.8  PLT 205 187   Recent Labs    10/09/18 0437 10/10/18 0447  NA 139 139  K 3.9 3.7  CL 107 107  CO2 21* 23  GLUCOSE 126* 97  BUN 27* 31*  CREATININE 1.27* 1.53*   No results for input(s): TROPONINI in the last 72 hours.  Invalid input(s): CK, MB Hepatic Function Panel Recent Labs    10/08/18 1902  PROT 6.6  ALBUMIN 3.6  AST 25  ALT 23  ALKPHOS 47   BILITOT 0.9   Cardiac studies: EKG 10/08/2018: Afib w.RVR w/IVCD I do not see Afib on most other telemetry strips this admission, and predominantly has sinus tachcycardia, first degree AV block, multiform PAC's.   Echocardiogram 09/25/2018: Study Conclusions  - Left ventricle: Wall thickness was increased in a pattern of moderate LVH. Systolic function was severely reduced. The estimated ejection fraction was in the range of 20% to 25%. Diffuse hypokinesis. The study is not technically sufficient to allow evaluation of LV diastolic function. - Ventricular septum: Septal motion showed abnormal function and dyssynergy. These changes are consistent with a left bundle branch block. - Mitral valve: Premature closure of the MV leaflets suggests elevated LVEDP. There was severe regurgitation directed centrally and posteriorly. - Left atrium: The atrium was moderately dilated. - Right ventricle: The cavity size was mildly dilated. - Right atrium: The atrium was mildly dilated. - Atrial septum: No defect or patent foramen ovale was identified. - Tricuspid valve: There was moderate-severe regurgitation. - Pulmonary arteries: PA peak pressure: 47 mm Hg (S).  Impressions:  - The right ventricular systolic pressure was increased consistent with moderate pulmonary hypertension.  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  Her cardiomyopathy is presumed nonischemic given only small area of ischemia on nuclear stress test. However, given her worsening cardiomyopathy and valvular heart disease, it would be important to rule out ischemic etiology. If truly  nonischemic cardiomyopathy, could consider cardiac resynchronization therapy as a management option for severe valvular regurgitation. If ischemic cardiomyopathy, may benefit from revascularization.  She remains deceptively euvolumic on exam-except JVD- but has increasing BNP on labs. I suspected this is largely due to her severe valvular regurgitation. I anticipate her to have recurrent shortness of breath on ambulation. Given her recurrent hospital admissions, I think it is best we keep her inpatient to monitor her I/O strictly, diurese her, monitor her renal function, and perform right and left heart cath while inpatient. I recommend transfer to [facility] hospital to achieve the above objectives. I have discussed this with hospitalist Dr. Marthenia Rolling regarding this. Appreciate his help.   Continue heart failure medical therapy with Entresto 97-103 mg bid, spironolactone 25 mg daily, metoprolol succinate 25 mg daily for now. If renal function worsens, may need to hold Entresto and spironolactone.  Continue IV lasix 40 mg daily for now. This may need to be held on 11/25 subject to her renal function, prior to possible cath on 11/26.  Paroxysmal Afib: CHA2DS2VASc score 5, annual stroke risk 7.2% Continue amiodarone 200 mg bid (with monitoring of intrinsically prolonged QTc due to her long QRS interval) for rate and rhyhthm control. Avoid diltiazem given her low EF.   I discussed recommendations with the patient and her two daughters. As stated previously, diet and medication compliance remain of paramount importance.    LOS: 0 days    Tiger Spieker J Melissaann Dizdarevic 10/10/2018, 11:05 AM  [place], MD [facility] Pager: [phone] Office: [phone] If no answer Cell [phone]

## 2018-10-10 NOTE — Progress Notes (Signed)
PROGRESS NOTE    Erin Novak  IHK:[id number] DOB: Apr 15, 1941 DOA: 10/08/2018 PCP: [name], MD  Outpatient Specialists:   Brief Narrative:  Patient is a 77 year old Caucasian female, with past medical history significant for chronic systolic congestive heart failure with EF of 20 to 25% and newly diagnosed atrial fibrillation.  Last echo was on 09/25/2018.  Echocardiogram revealed EF of 20 to 25%, diffuse hypokinesis, left ventricular diastolic function could not be assessed, severe mitral regurgitation and PA peak pressure 47 mmHg.  Patient was admitted with CHF exacerbation.  Patient seems to be improving.  Will consult the cardiology team.   Assessment & Plan:   Principal Problem:   Acute on chronic systolic CHF (congestive heart failure) ([place]) Active Problems:   Hypothyroidism   Essential hypertension   Malignant neoplasm of upper-outer quadrant of left breast in female, estrogen receptor positive ([place])   New onset a-fib ([place])   Acute on chronic systolic CHF: Echo done September 25, 2018 is as documented above. Daily weights low-sodium diet strict I's and O's.  Give her dose of her Eliquis tonight Continue home Entresto and Aldactone Cardiology consult 10/10/2018: Cardiology input is appreciated.  Patient will be transferred to the [facility] for possible cardiac catheterization on Tuesday, 10/12/2018.  Continue to monitor renal function and electrolytes closely.  New onset a-fib: Cardiology consult as patient may need further work-up. Rate controlled on anticoagulation.  Hypothyroidism  Essential hypertension: Continue to optimize. Monitor closely.  Malignant neoplasm of upper-outer quadrant of left breast in female, estrogen receptor positive   Continue home her anastrozole  DVT prophylaxis: Eliquis Code Status: Full Disposition Plan: home 1-2 days    Consultants:   Cardiology  Procedures:   None  Antimicrobials:    None   Subjective: Shortness of breath has improved significantly. No chest pain.  Objective: Vitals:   10/09/18 0655 10/09/18 1426 10/09/18 1955 10/10/18 0527  BP: (!) 131/108 105/85 94/75 96/71   Pulse: (!) 111 96 95 73  Resp: 16 (!) 22 18 (!) 21  Temp: 97.7 F (36.5 C) (!) 97.5 F (36.4 C) 98.1 F (36.7 C) (!) 97.3 F (36.3 C)  TempSrc: Oral  Oral Oral  SpO2: 97% 97% 94% 96%  Weight:    78.2 kg  Height:        Intake/Output Summary (Last 24 hours) at 10/10/2018 1222 Last data filed at 10/10/2018 0600 Gross per 24 hour  Intake -  Output 300 ml  Net -300 ml   Filed Weights   10/08/18 1829 10/08/18 2158 10/10/18 0527  Weight: 79.8 kg 78.8 kg 78.2 kg    Examination:  General exam: Appears calm and comfortable  Respiratory system: Clear to auscultation.  Cardiovascular system: S1 & S2.   Gastrointestinal system: Abdomen is nondistended, soft and nontender. No organomegaly or masses felt. Normal bowel sounds heard. Central nervous system: Alert and oriented. No focal neurological deficits. Extremities: No leg edema.  Data Reviewed: I have personally reviewed following labs and imaging studies  CBC: Recent Labs  Lab 10/08/18 1012 10/08/18 1902 10/10/18 0447  WBC 8.5 8.3 8.5  NEUTROABS 5.8 5.2  --   HGB 14.7 14.8 13.8  HCT 47.1* 46.7* 44.4  MCV 97.3 97.5 98.4  PLT 206 205 756   Basic Metabolic Panel: Recent Labs  Lab 10/08/18 1012 10/08/18 1902 10/09/18 0437 10/10/18 0447  NA 138 139 139 139  K 3.8 4.0 3.9 3.7  CL 107 107 107 107  CO2 21* 21* 21* 23  GLUCOSE 134* 110* 126* 97  BUN 23 23 27* 31*  CREATININE 1.26* 1.23* 1.27* 1.53*  CALCIUM 9.0 8.9 9.1 8.8*  MG 2.0 1.8  --   --    GFR: Estimated Creatinine Clearance: 30 mL/min (A) (by C-G formula based on SCr of 1.53 mg/dL (H)). Liver Function Tests: Recent Labs  Lab 10/08/18 1902  AST 25  ALT 23  ALKPHOS 47  BILITOT 0.9  PROT 6.6  ALBUMIN 3.6   No results for input(s): LIPASE,  AMYLASE in the last 168 hours. No results for input(s): AMMONIA in the last 168 hours. Coagulation Profile: No results for input(s): INR, PROTIME in the last 168 hours. Cardiac Enzymes: No results for input(s): CKTOTAL, CKMB, CKMBINDEX, TROPONINI in the last 168 hours. BNP (last 3 results) No results for input(s): PROBNP in the last 8760 hours. HbA1C: No results for input(s): HGBA1C in the last 72 hours. CBG: No results for input(s): GLUCAP in the last 168 hours. Lipid Profile: No results for input(s): CHOL, HDL, LDLCALC, TRIG, CHOLHDL, LDLDIRECT in the last 72 hours. Thyroid Function Tests: Recent Labs    10/08/18 1012  TSH 6.152*   Anemia Panel: No results for input(s): VITAMINB12, FOLATE, FERRITIN, TIBC, IRON, RETICCTPCT in the last 72 hours. Urine analysis:    Component Value Date/Time   COLORURINE YELLOW 12/23/2011 1436   APPEARANCEUR CLOUDY (A) 12/23/2011 1436   LABSPEC 1.017 12/23/2011 1436   PHURINE 7.5 12/23/2011 1436   GLUCOSEU NEGATIVE 12/23/2011 1436   HGBUR NEGATIVE 12/23/2011 1436   BILIRUBINUR NEGATIVE 12/23/2011 1436   KETONESUR NEGATIVE 12/23/2011 1436   PROTEINUR NEGATIVE 12/23/2011 1436   UROBILINOGEN 0.2 12/23/2011 1436   NITRITE NEGATIVE 12/23/2011 1436   LEUKOCYTESUR NEGATIVE 12/23/2011 1436   Sepsis Labs: @LABRCNTIP (procalcitonin:4,lacticidven:4)  )No results found for this or any previous visit (from the past 240 hour(s)).       Radiology Studies: Dg Chest 2 View  Result Date: 10/08/2018 CLINICAL DATA:  Diagnosis from the emergency department with new diagnosis of atrial fibrillation today. Increasing shortness of breath and fatigue at home. EXAM: CHEST - 2 VIEW COMPARISON:  10/08/2018 and 10/01/2018 radiographs. FINDINGS: 1928 hours. Stable cardiomegaly, tiny bilateral pleural effusions and mild interstitial prominence. There is no invert pulmonary edema, confluent airspace opacity or pneumothorax. Telemetry leads overlie the chest. No acute  osseous findings. IMPRESSION: Stable chest with cardiomegaly and trace bilateral pleural effusions. Electronically Signed   By: Richardean Sale M.D.   On: 10/08/2018 19:47        Scheduled Meds: . amiodarone  200 mg Oral BID  . anastrozole  1 mg Oral Daily  . atorvastatin  10 mg Oral Daily  . furosemide  40 mg Oral Daily  . levothyroxine  88 mcg Oral BH-q7a  . loratadine  10 mg Oral Daily  . metoprolol succinate  25 mg Oral Daily  . sacubitril-valsartan  1 tablet Oral BID  . spironolactone  25 mg Oral Daily   Continuous Infusions:   LOS: 0 days    Time spent: 25 minutes.    Dana Allan, MD  [facility] Pager #: [phone] 7PM-7AM contact night coverage as above

## 2018-10-10 NOTE — Progress Notes (Signed)
Nutrition Brief Note  Patient identified on the Malnutrition Screening Tool (MST) Report  Patient reported 9 lb of weight loss over the past 2 weeks. Pt recently admitted where she was IV diuresed, weight loss is most likely fluid loss. Pt received low sodium diet education during that admission as well.  Wt Readings from Last 15 Encounters:  10/10/18 78.2 kg  10/01/18 83.9 kg  09/28/18 84.1 kg  03/02/18 79.1 kg  08/05/17 81.3 kg  07/15/17 81.3 kg  07/08/17 81 kg  05/12/17 81 kg  04/07/17 80.9 kg  03/03/17 78.9 kg  01/28/17 80.3 kg  08/25/13 81.6 kg  08/11/13 81.9 kg  01/28/12 78 kg  12/25/11 78.5 kg    Body mass index is 30.54 kg/m. Patient meets criteria for obesity based on current BMI.   Current diet order is heart healthy. Labs and medications reviewed.   No nutrition interventions warranted at this time. If nutrition issues arise, please consult RD.   Clayton Bibles, MS, RD, [place] [facility] Pager: [phone] After Hours Pager: [phone]

## 2018-10-10 NOTE — Progress Notes (Signed)
   10/10/18 2228  Vitals  Temp 98.6 F (37 C)  Temp Source Oral  BP 128/80  MAP (mmHg) 91  BP Location Right Arm  BP Method Automatic  Patient Position (if appropriate) Sitting  Pulse Rate 100  Pulse Rate Source Monitor  Resp 20  Oxygen Therapy  SpO2 97 %  O2 Device Room Air  Admitted pt to rm 3E20 from [facility] via [facility]link, pt alert and oriented, denied pain at this time, oriented to room, call bell placed within reach, placed on cardiac monitor, CCMD made aware.

## 2018-10-10 NOTE — Progress Notes (Signed)
Pt's dtr Lattie Haw notified of patient's bed location at [facility]. Room/bed number given to dtr. VWilliams,RN.

## 2018-10-11 LAB — BASIC METABOLIC PANEL
Anion gap: 6 (ref 5–15)
BUN: 23 mg/dL (ref 8–23)
CHLORIDE: 107 mmol/L (ref 98–111)
CO2: 26 mmol/L (ref 22–32)
Calcium: 8.6 mg/dL — ABNORMAL LOW (ref 8.9–10.3)
Creatinine, Ser: 1.41 mg/dL — ABNORMAL HIGH (ref 0.44–1.00)
GFR calc non Af Amer: 35 mL/min — ABNORMAL LOW (ref >60)
GFR, EST AFRICAN AMERICAN: 40 mL/min — AB (ref >60)
Glucose, Bld: 110 mg/dL — ABNORMAL HIGH (ref 70–99)
POTASSIUM: 3.7 mmol/L (ref 3.5–5.1)
Sodium: 139 mmol/L (ref 135–145)

## 2018-10-11 LAB — BRAIN NATRIURETIC PEPTIDE: B Natriuretic Peptide: 957 pg/mL — ABNORMAL HIGH (ref 0.0–100.0)

## 2018-10-11 MED ORDER — ASPIRIN 81 MG PO CHEW
81.0000 mg | CHEWABLE_TABLET | ORAL | Status: AC
  Administered 2018-10-12: 81 mg via ORAL
  Filled 2018-10-11: qty 1

## 2018-10-11 MED ORDER — SODIUM CHLORIDE 0.9% FLUSH: 3.0000 mL | INTRAVENOUS | Status: DC | PRN

## 2018-10-11 MED ORDER — SODIUM CHLORIDE 0.9 % IV SOLN: 250.0000 mL | INTRAVENOUS | Status: DC | PRN

## 2018-10-11 MED ORDER — SODIUM CHLORIDE 0.9% FLUSH
3.0000 mL | Freq: Two times a day (BID) | INTRAVENOUS | Status: DC
  Administered 2018-10-11 – 2018-10-12 (×2): 3 mL via INTRAVENOUS

## 2018-10-11 MED ORDER — BACITRACIN-NEOMYCIN-POLYMYXIN OINTMENT TUBE
TOPICAL_OINTMENT | Freq: Three times a day (TID) | CUTANEOUS | Status: DC
  Administered 2018-10-11 – 2018-10-12 (×3): via TOPICAL
  Administered 2018-10-12 – 2018-10-13 (×2): 1 via TOPICAL
  Filled 2018-10-11: qty 14

## 2018-10-11 MED ORDER — SODIUM CHLORIDE 0.9 % WEIGHT BASED INFUSION
0.5000 mL/kg/h | INTRAVENOUS | Status: DC
  Administered 2018-10-11: 0.5 mL/kg/h via INTRAVENOUS

## 2018-10-11 MED ORDER — ISOSORB DINITRATE-HYDRALAZINE 20-37.5 MG PO TABS
1.0000 | ORAL_TABLET | Freq: Three times a day (TID) | ORAL | Status: DC
  Administered 2018-10-11 – 2018-10-13 (×6): 1 via ORAL
  Filled 2018-10-11 (×7): qty 1

## 2018-10-11 NOTE — Progress Notes (Signed)
Pt had a boil like redness on her right armpit. [facility] on call made aware.

## 2018-10-11 NOTE — Progress Notes (Signed)
Subjective:  Dyspnea is better  Objective:  Vital Signs in the last 24 hours: Temp:  [98 F (36.7 C)-98.6 F (37 C)] 98 F (36.7 C) (11/25 0426) Pulse Rate:  [79-100] 88 (11/25 0426) Resp:  [14-20] 20 (11/25 0426) BP: (99-128)/(62-80) 115/67 (11/25 0426) SpO2:  [97 %-99 %] 97 % (11/25 0426) Weight:  [78.4 kg] 78.4 kg (11/25 0048)  Intake/Output from previous day: 11/24 0701 - 11/25 0700 In: 360 [P.O.:360] Out: 700 [Urine:700]  Physical Exam: Blood pressure 115/67, pulse 88, temperature 98 F (36.7 C), temperature source Oral, resp. rate 20, height 5\' 3"  (1.6 m), weight 78.4 kg, SpO2 97 %.  Physical Exam  Constitutional: She is oriented to person, place, and time. She appears well-nourished. No distress.  Mildly obese  Eyes: Conjunctivae are normal.  Neck: Neck supple. JVD present.  Cardiovascular: Normal rate, regular rhythm and intact distal pulses. Exam reveals distant heart sounds.  Pulmonary/Chest: Effort normal and breath sounds normal.  Abdominal: Soft. Bowel sounds are normal.  Musculoskeletal: Normal range of motion. She exhibits no edema.  Neurological: She is alert and oriented to person, place, and time.  Skin: Skin is warm and dry.    Lab Results: BMP Recent Labs    10/08/18 1902 10/09/18 0437 10/10/18 0447  NA 139 139 139  K 4.0 3.9 3.7  CL 107 107 107  CO2 21* 21* 23  GLUCOSE 110* 126* 97  BUN 23 27* 31*  CREATININE 1.23* 1.27* 1.53*  CALCIUM 8.9 9.1 8.8*  GFRNONAA 41* 39* 31*  GFRAA 47* 46* 36*    CBC Recent Labs  Lab 10/08/18 1902 10/10/18 0447  WBC 8.3 8.5  RBC 4.79 4.51  HGB 14.8 13.8  HCT 46.7* 44.4  PLT 205 187  MCV 97.5 98.4  MCH 30.9 30.6  MCHC 31.7 31.1  RDW 13.6 13.4  LYMPHSABS 2.2  --   MONOABS 0.8  --   EOSABS 0.1  --   BASOSABS 0.1  --     Cardiac Panel (last 3 results) Recent Labs    09/25/18 0514 09/25/18 1227 10/01/18 1256  TROPONINI 0.04* 0.03* <0.03    TSH Recent Labs    09/25/18 0018  10/08/18 1012  TSH 4.865* 6.152*    Lipid Panel     Component Value Date/Time   CHOL 89 09/25/2018 0514   TRIG 47 09/25/2018 0514   HDL 31 (L) 09/25/2018 0514   CHOLHDL 2.9 09/25/2018 0514   VLDL 9 09/25/2018 0514   LDLCALC 49 09/25/2018 0514     Hepatic Function Panel Recent Labs    09/24/18 2053 09/25/18 0514 10/08/18 1902  PROT 6.4* 6.1* 6.6  ALBUMIN 3.7 3.4* 3.6  AST 27 30 25   ALT 23 23 23   ALKPHOS 54 52 47  BILITOT 1.3* 1.1 0.9  BILIDIR 0.3*  --   --   IBILI 1.0*  --   --     Imaging: No results found.  Cardiac Studies:  EKG 10/08/2018: Normal sinus rhythm, indeterminate axis, left bundle branch block  Echocardiogram 09/26/2018: Severe LV systolic dysfunction, EF 20 to 25%.  No change from echocardiogram on 05/13/2018 done in our office.  Grade 2 diastolic dysfunction.  Severe posteriorly directed mitral regurgitation.  Mild RV dilatation, mild to moderate pulmonary hypertension.  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  Assessment/Plan:  1.  Acute on chronic systolic and diastolic heart failure with wall motion abnormality in the inferior wall by nuclear stress test and suggestion of ischemia with posteriorly directed MR suggestive of papillary muscle dysfunction. 2. 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 3.  Hypertension 4.  Hyperlipidemia   Recommendation: We will discontinue Entresto and also spironolactone for now in preparation for coronary angiography and right heart catheterization tomorrow.  I will start the patient on BiDil 1 p.o. 3 times daily until Entresto can be restarted in the outpatient basis.  Heart failure appears to be much more stable, minimal JVD is present, no leg  edema, lungs are clear today.  Blood pressure is well controlled.  Lipids are well controlled.  Will continue to trend her serum creatinine. Adrian Prows, M.D. 10/11/2018, 8:31 AM [facility] Pager: [phone] Office: [phone] If no answer: [phone]

## 2018-10-11 NOTE — Consult Note (Signed)
   [facility] Inpatient Consult   10/11/2018  NHYLA NAPPI August 19, 1941 [id number]   Patient screened for potential [facility] Care Management services due to unplanned readmission risk score of 25%. Went to bedside to speak with patient regarding [place].   Patient declines all services at this time. States, "I don't need that right now, not yet."   Netta Cedars, MSN, [facility] Liaison Nurse Mobile Phone [phone]  Toll free office [phone]

## 2018-10-11 NOTE — Progress Notes (Signed)
Consent form for cardiac catheterization signed by patient

## 2018-10-11 NOTE — H&P (View-Only) (Signed)
Subjective:  Dyspnea is better  Objective:  Vital Signs in the last 24 hours: Temp:  [98 F (36.7 C)-98.6 F (37 C)] 98 F (36.7 C) (11/25 0426) Pulse Rate:  [79-100] 88 (11/25 0426) Resp:  [14-20] 20 (11/25 0426) BP: (99-128)/(62-80) 115/67 (11/25 0426) SpO2:  [97 %-99 %] 97 % (11/25 0426) Weight:  [78.4 kg] 78.4 kg (11/25 0048)  Intake/Output from previous day: 11/24 0701 - 11/25 0700 In: 360 [P.O.:360] Out: 700 [Urine:700]  Physical Exam: Blood pressure 115/67, pulse 88, temperature 98 F (36.7 C), temperature source Oral, resp. rate 20, height 5\' 3"  (1.6 m), weight 78.4 kg, SpO2 97 %.  Physical Exam  Constitutional: She is oriented to person, place, and time. She appears well-nourished. No distress.  Mildly obese  Eyes: Conjunctivae are normal.  Neck: Neck supple. JVD present.  Cardiovascular: Normal rate, regular rhythm and intact distal pulses. Exam reveals distant heart sounds.  Pulmonary/Chest: Effort normal and breath sounds normal.  Abdominal: Soft. Bowel sounds are normal.  Musculoskeletal: Normal range of motion. She exhibits no edema.  Neurological: She is alert and oriented to person, place, and time.  Skin: Skin is warm and dry.    Lab Results: BMP Recent Labs    10/08/18 1902 10/09/18 0437 10/10/18 0447  NA 139 139 139  K 4.0 3.9 3.7  CL 107 107 107  CO2 21* 21* 23  GLUCOSE 110* 126* 97  BUN 23 27* 31*  CREATININE 1.23* 1.27* 1.53*  CALCIUM 8.9 9.1 8.8*  GFRNONAA 41* 39* 31*  GFRAA 47* 46* 36*    CBC Recent Labs  Lab 10/08/18 1902 10/10/18 0447  WBC 8.3 8.5  RBC 4.79 4.51  HGB 14.8 13.8  HCT 46.7* 44.4  PLT 205 187  MCV 97.5 98.4  MCH 30.9 30.6  MCHC 31.7 31.1  RDW 13.6 13.4  LYMPHSABS 2.2  --   MONOABS 0.8  --   EOSABS 0.1  --   BASOSABS 0.1  --     Cardiac Panel (last 3 results) Recent Labs    09/25/18 0514 09/25/18 1227 10/01/18 1256  TROPONINI 0.04* 0.03* <0.03    TSH Recent Labs    09/25/18 0018  10/08/18 1012  TSH 4.865* 6.152*    Lipid Panel     Component Value Date/Time   CHOL 89 09/25/2018 0514   TRIG 47 09/25/2018 0514   HDL 31 (L) 09/25/2018 0514   CHOLHDL 2.9 09/25/2018 0514   VLDL 9 09/25/2018 0514   LDLCALC 49 09/25/2018 0514     Hepatic Function Panel Recent Labs    09/24/18 2053 09/25/18 0514 10/08/18 1902  PROT 6.4* 6.1* 6.6  ALBUMIN 3.7 3.4* 3.6  AST 27 30 25   ALT 23 23 23   ALKPHOS 54 52 47  BILITOT 1.3* 1.1 0.9  BILIDIR 0.3*  --   --   IBILI 1.0*  --   --     Imaging: No results found.  Cardiac Studies:  EKG 10/08/2018: Normal sinus rhythm, indeterminate axis, left bundle branch block  Echocardiogram 09/26/2018: Severe LV systolic dysfunction, EF 20 to 25%.  No change from echocardiogram on 05/13/2018 done in our office.  Grade 2 diastolic dysfunction.  Severe posteriorly directed mitral regurgitation.  Mild RV dilatation, mild to moderate pulmonary hypertension.  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  Assessment/Plan:  1.  Acute on chronic systolic and diastolic heart failure with wall motion abnormality in the inferior wall by nuclear stress test and suggestion of ischemia with posteriorly directed MR suggestive of papillary muscle dysfunction. 2. 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 3.  Hypertension 4.  Hyperlipidemia   Recommendation: We will discontinue Entresto and also spironolactone for now in preparation for coronary angiography and right heart catheterization tomorrow.  I will start the patient on BiDil 1 p.o. 3 times daily until Entresto can be restarted in the outpatient basis.  Heart failure appears to be much more stable, minimal JVD is present, no leg  edema, lungs are clear today.  Blood pressure is well controlled.  Lipids are well controlled.  Will continue to trend her serum creatinine. Adrian Prows, M.D. 10/11/2018, 8:31 AM [facility] Pager: [phone] Office: [phone] If no answer: [phone]

## 2018-10-11 NOTE — Progress Notes (Addendum)
Warm compress place to right axilla. Patient has tender nodule in that area. MD is aware and has seen axilla.

## 2018-10-11 NOTE — Progress Notes (Signed)
PROGRESS NOTE    DAGNY FIORENTINO  [id number] DOB: 1941/01/19 DOA: 10/08/2018 PCP: [name], MD    Brief Narrative:  77 year old female who presented with dyspnea.  Significant past medical history for diastolic heart failure.  Patient initially presented to the emergency room with dyspnea, she was found in atrial fibrillation, received IV diltiazem and return to sinus rhythm, she was hospital discharged on apixaban.  At home she continued to have worsening dyspnea, worse with exertion, and that prompted her to return to emergency department.  Her initial call examination blood pressure 116/76, heart rate 108, respiratory rate 24, temperature 98.5, oxygen saturation 97%.  She had decreased breath sounds at bases, she was tachycardic, no gallops, rubs or murmurs, the abdomen soft nontender, no significant lower extremity edema.  Sodium 139, potassium 4.0, chloride 107, bicarb 21, glucose 110, BUN 23, creatinine 1.23, white count 8.3, hemoglobin 14.8, hematocrit 46.7, platelets 207, BNP 3301, chest radiograph with hilar vascular congestion, positive cardiomegaly, EKG sinus rhythm, right axis deviation, left bundle branch block, poor R wave progression.  Patient was admitted to the hospital with the working diagnosis of acute on chronic systic heart failure decompensation.    Assessment & Plan:   Principal Problem:   Acute on chronic systolic CHF (congestive heart failure) (HCC) Active Problems:   Hypothyroidism   Essential hypertension   Malignant neoplasm of upper-outer quadrant of left breast in female, estrogen receptor positive ([place])   New onset a-fib (HCC)   Acute CHF (congestive heart failure) ([place])   1. Acute on chronic systolic heart failure (EF 20 to 25%) Continue furosemide 40 mg daily to keep negative fluid balance, continue heart failure management with Bidil and metoprolol Xl. Her fluid balance is negative and clinically with no significant signs of hypervolemia.   2.  Atrial fibrillation. Continue rate control with amiodarone and metoprolol, continue telemetry monitoring. Holding anticoagulation for cardiac catheterization in am.   3. HTN. Continue blood pressure control with hydralazine/ isosorbide and metoprolol.  4. Hypothyroid. Continue levothyroxine.  5. History of breast cancer. Follow as outpatient. On Anastrozole.   6. Right axillar folicullitis. Will add topical antibiotic therapy, continue pain control.    DVT prophylaxis: apixaban    Code Status: full Family Communication: no family at the bedside  Disposition Plan/ discharge barriers: pending cardiac workup.   Body mass index is 30.63 kg/m. Malnutrition Type:      Malnutrition Characteristics:      Nutrition Interventions:     RN Pressure Injury Documentation:     Consultants:   Cardiology   Procedures:     Antimicrobials:       Subjective: Patient with improved dyspnea, but not back to her baseline, no chest pain, or lower extremity edema. Positive tender nodule, in the right axillar region. NO nausea or vomiting.   Objective: Vitals:   10/10/18 2228 10/11/18 0048 10/11/18 0426 10/11/18 0854  BP: 128/80 114/74 115/67 107/63  Pulse: 100 93 88 83  Resp: 20  20 17   Temp: 98.6 F (37 C)  98 F (36.7 C) 97.7 F (36.5 C)  TempSrc: Oral  Oral Oral  SpO2: 97% 99% 97% 95%  Weight:  78.4 kg    Height:  5\' 3"  (1.6 m)      Intake/Output Summary (Last 24 hours) at 10/11/2018 1005 Last data filed at 10/11/2018 0856 Gross per 24 hour  Intake 580 ml  Output 700 ml  Net -120 ml   Filed Weights   10/08/18 2158 10/10/18  9892 10/11/18 0048  Weight: 78.8 kg 78.2 kg 78.4 kg    Examination:   General: deconditioned  Neurology: Awake and alert, non focal  E ENT: mild pallor, no icterus, oral mucosa moist Cardiovascular: No JVD. S1-S2 present, rhythmic, no gallops, rubs, or murmurs. Trace lower extremity edema. Pulmonary: positive breath sounds bilaterally,  adequate air movement, no wheezing, rhonchi or rales. Gastrointestinal. Abdomen with no organomegaly, non tender, no rebound or guarding Skin. Firm, erythematous, tender nodule in the right axillar fossa.  Musculoskeletal: no joint deformities     Data Reviewed: I have personally reviewed following labs and imaging studies  CBC: Recent Labs  Lab 10/08/18 1012 10/08/18 1902 10/10/18 0447  WBC 8.5 8.3 8.5  NEUTROABS 5.8 5.2  --   HGB 14.7 14.8 13.8  HCT 47.1* 46.7* 44.4  MCV 97.3 97.5 98.4  PLT 206 205 119   Basic Metabolic Panel: Recent Labs  Lab 10/08/18 1012 10/08/18 1902 10/09/18 0437 10/10/18 0447  NA 138 139 139 139  K 3.8 4.0 3.9 3.7  CL 107 107 107 107  CO2 21* 21* 21* 23  GLUCOSE 134* 110* 126* 97  BUN 23 23 27* 31*  CREATININE 1.26* 1.23* 1.27* 1.53*  CALCIUM 9.0 8.9 9.1 8.8*  MG 2.0 1.8  --   --    GFR: Estimated Creatinine Clearance: 30 mL/min (A) (by C-G formula based on SCr of 1.53 mg/dL (H)). Liver Function Tests: Recent Labs  Lab 10/08/18 1902  AST 25  ALT 23  ALKPHOS 47  BILITOT 0.9  PROT 6.6  ALBUMIN 3.6   No results for input(s): LIPASE, AMYLASE in the last 168 hours. No results for input(s): AMMONIA in the last 168 hours. Coagulation Profile: No results for input(s): INR, PROTIME in the last 168 hours. Cardiac Enzymes: No results for input(s): CKTOTAL, CKMB, CKMBINDEX, TROPONINI in the last 168 hours. BNP (last 3 results) No results for input(s): PROBNP in the last 8760 hours. HbA1C: No results for input(s): HGBA1C in the last 72 hours. CBG: No results for input(s): GLUCAP in the last 168 hours. Lipid Profile: No results for input(s): CHOL, HDL, LDLCALC, TRIG, CHOLHDL, LDLDIRECT in the last 72 hours. Thyroid Function Tests: Recent Labs    10/08/18 1012  TSH 6.152*   Anemia Panel: No results for input(s): VITAMINB12, FOLATE, FERRITIN, TIBC, IRON, RETICCTPCT in the last 72 hours.    Radiology Studies: I have reviewed all  of the imaging during this hospital visit personally     Scheduled Meds: . amiodarone  200 mg Oral BID  . anastrozole  1 mg Oral Daily  . atorvastatin  10 mg Oral Daily  . furosemide  40 mg Oral Daily  . isosorbide-hydrALAZINE  1 tablet Oral TID  . levothyroxine  88 mcg Oral BH-q7a  . loratadine  10 mg Oral Daily  . metoprolol succinate  25 mg Oral Daily   Continuous Infusions:   LOS: 1 day        Mauricio Gerome Apley, MD [facility] Pager [phone]

## 2018-10-12 ENCOUNTER — Encounter (HOSPITAL_COMMUNITY)
Admission: EM | Disposition: A | Discharge status: Home / Self Care | Payer: Self-pay | Source: Home / Self Care | Attending: Internal Medicine

## 2018-10-12 DIAGNOSIS — I480 Paroxysmal atrial fibrillation: Secondary | ICD-10-CM

## 2018-10-12 DIAGNOSIS — I5021 Acute systolic (congestive) heart failure: Secondary | ICD-10-CM

## 2018-10-12 HISTORY — PX: RIGHT/LEFT HEART CATH AND CORONARY ANGIOGRAPHY: CATH118266

## 2018-10-12 LAB — BASIC METABOLIC PANEL
Anion gap: 6 (ref 5–15)
BUN: 21 mg/dL (ref 8–23)
CALCIUM: 8.4 mg/dL — AB (ref 8.9–10.3)
CHLORIDE: 107 mmol/L (ref 98–111)
CO2: 26 mmol/L (ref 22–32)
Creatinine, Ser: 1.32 mg/dL — ABNORMAL HIGH (ref 0.44–1.00)
GFR calc non Af Amer: 38 mL/min — ABNORMAL LOW (ref >60)
GFR, EST AFRICAN AMERICAN: 44 mL/min — AB (ref >60)
Glucose, Bld: 99 mg/dL (ref 70–99)
Potassium: 3.5 mmol/L (ref 3.5–5.1)
Sodium: 139 mmol/L (ref 135–145)

## 2018-10-12 LAB — POCT I-STAT 3, VENOUS BLOOD GAS (G3P V)
ACID-BASE DEFICIT: 2 mmol/L (ref 0.0–2.0)
Bicarbonate: 22.9 mmol/L (ref 20.0–28.0)
Bicarbonate: 25.4 mmol/L (ref 20.0–28.0)
O2 Saturation: 69 %
O2 Saturation: 96 %
PCO2 VEN: 38.6 mmHg — AB (ref 44.0–60.0)
PH VEN: 7.381 (ref 7.250–7.430)
PO2 VEN: 37 mmHg (ref 32.0–45.0)
PO2 VEN: 85 mmHg — AB (ref 32.0–45.0)
TCO2: 24 mmol/L (ref 22–32)
TCO2: 27 mmol/L (ref 22–32)
pCO2, Ven: 42.9 mmHg — ABNORMAL LOW (ref 44.0–60.0)
pH, Ven: 7.381 (ref 7.250–7.430)

## 2018-10-12 LAB — POCT ACTIVATED CLOTTING TIME: Activated Clotting Time: 175 seconds

## 2018-10-12 SURGERY — RIGHT/LEFT HEART CATH AND CORONARY ANGIOGRAPHY
Anesthesia: LOCAL

## 2018-10-12 MED ORDER — NITROGLYCERIN 1 MG/10 ML FOR IR/CATH LAB
INTRA_ARTERIAL | Status: AC
  Filled 2018-10-12: qty 10

## 2018-10-12 MED ORDER — MIDAZOLAM HCL 2 MG/2ML IJ SOLN
INTRAMUSCULAR | Status: DC | PRN
  Administered 2018-10-12 (×2): 1 mg via INTRAVENOUS

## 2018-10-12 MED ORDER — NITROGLYCERIN 1 MG/10 ML FOR IR/CATH LAB
INTRA_ARTERIAL | Status: DC | PRN
  Administered 2018-10-12: 200 ug via INTRA_ARTERIAL

## 2018-10-12 MED ORDER — HEPARIN SODIUM (PORCINE) 1000 UNIT/ML IJ SOLN
INTRAMUSCULAR | Status: DC | PRN
  Administered 2018-10-12: 3500 [IU] via INTRAVENOUS

## 2018-10-12 MED ORDER — FENTANYL CITRATE (PF) 100 MCG/2ML IJ SOLN
INTRAMUSCULAR | Status: AC
  Filled 2018-10-12: qty 2

## 2018-10-12 MED ORDER — LIDOCAINE HCL (PF) 1 % IJ SOLN
INTRAMUSCULAR | Status: AC
  Filled 2018-10-12: qty 30

## 2018-10-12 MED ORDER — LIDOCAINE HCL (PF) 1 % IJ SOLN
INTRAMUSCULAR | Status: DC | PRN
  Administered 2018-10-12: 15 mL
  Administered 2018-10-12 (×3): 2 mL

## 2018-10-12 MED ORDER — VERAPAMIL HCL 2.5 MG/ML IV SOLN
INTRAVENOUS | Status: AC
  Filled 2018-10-12: qty 2

## 2018-10-12 MED ORDER — HEPARIN (PORCINE) IN NACL 1000-0.9 UT/500ML-% IV SOLN
INTRAVENOUS | Status: DC | PRN
  Administered 2018-10-12 (×2): 500 mL

## 2018-10-12 MED ORDER — MIDAZOLAM HCL 2 MG/2ML IJ SOLN
INTRAMUSCULAR | Status: AC
  Filled 2018-10-12: qty 2

## 2018-10-12 MED ORDER — SODIUM CHLORIDE 0.9% FLUSH
3.0000 mL | Freq: Two times a day (BID) | INTRAVENOUS | Status: DC
  Administered 2018-10-12 – 2018-10-13 (×3): 3 mL via INTRAVENOUS

## 2018-10-12 MED ORDER — ACETAMINOPHEN 325 MG PO TABS: 650.0000 mg | ORAL_TABLET | ORAL | Status: DC | PRN

## 2018-10-12 MED ORDER — ONDANSETRON HCL 4 MG/2ML IJ SOLN: 4.0000 mg | Freq: Four times a day (QID) | INTRAMUSCULAR | Status: DC | PRN

## 2018-10-12 MED ORDER — HEPARIN SODIUM (PORCINE) 1000 UNIT/ML IJ SOLN
INTRAMUSCULAR | Status: AC
  Filled 2018-10-12: qty 1

## 2018-10-12 MED ORDER — IOHEXOL 350 MG/ML SOLN
INTRAVENOUS | Status: DC | PRN
  Administered 2018-10-12: 20 mL via ORAL

## 2018-10-12 MED ORDER — APIXABAN 5 MG PO TABS
5.0000 mg | ORAL_TABLET | Freq: Two times a day (BID) | ORAL | Status: DC
  Administered 2018-10-13: 5 mg via ORAL
  Filled 2018-10-12: qty 1

## 2018-10-12 MED ORDER — SODIUM CHLORIDE 0.9% FLUSH: 3.0000 mL | INTRAVENOUS | Status: DC | PRN

## 2018-10-12 MED ORDER — HEPARIN (PORCINE) IN NACL 1000-0.9 UT/500ML-% IV SOLN
INTRAVENOUS | Status: AC
  Filled 2018-10-12: qty 1000

## 2018-10-12 MED ORDER — SACUBITRIL-VALSARTAN 49-51 MG PO TABS
1.0000 | ORAL_TABLET | Freq: Two times a day (BID) | ORAL | Status: DC
  Administered 2018-10-12 – 2018-10-13 (×2): 1 via ORAL
  Filled 2018-10-12 (×2): qty 1

## 2018-10-12 MED ORDER — SODIUM CHLORIDE 0.9 % IV SOLN: 250.0000 mL | INTRAVENOUS | Status: DC | PRN

## 2018-10-12 MED ORDER — FENTANYL CITRATE (PF) 100 MCG/2ML IJ SOLN
INTRAMUSCULAR | Status: DC | PRN
  Administered 2018-10-12 (×2): 25 ug via INTRAVENOUS

## 2018-10-12 SURGICAL SUPPLY — 15 items
CATH 5FR JL3.5 JR4 ANG PIG MP (CATHETERS) ×2 IMPLANT
CATH SWAN GANZ 7F STRAIGHT (CATHETERS) ×2 IMPLANT
DEVICE RAD COMP TR BAND LRG (VASCULAR PRODUCTS) ×2 IMPLANT
GLIDESHEATH SLEND A-KIT 6F 22G (SHEATH) ×2 IMPLANT
GUIDEWIRE INQWIRE 1.5J.035X260 (WIRE) ×1 IMPLANT
INQWIRE 1.5J .035X260CM (WIRE) ×2
KIT HEART LEFT (KITS) ×2 IMPLANT
KIT MICROPUNCTURE NIT STIFF (SHEATH) ×2 IMPLANT
PACK CARDIAC CATHETERIZATION (CUSTOM PROCEDURE TRAY) ×2 IMPLANT
SHEATH GLIDE SLENDER 4/5FR (SHEATH) ×2 IMPLANT
SHEATH PINNACLE 7F 10CM (SHEATH) ×2 IMPLANT
SHEATH PROBE COVER 6X72 (BAG) ×2 IMPLANT
TRANSDUCER W/STOPCOCK (MISCELLANEOUS) ×2 IMPLANT
TUBING CIL FLEX 10 FLL-RA (TUBING) ×2 IMPLANT
WIRE EMERALD 3MM-J .025X260CM (WIRE) ×2 IMPLANT

## 2018-10-12 NOTE — Progress Notes (Addendum)
Subjective:  Remains asymptomatic at rest  BNP down to 957. Underwent right and left heart cath today.   Objective:  Vital Signs in the last 24 hours: Temp:  [97.2 F (36.2 C)-98.2 F (36.8 C)] 97.3 F (36.3 C) (11/26 1135) Pulse Rate:  [69-161] 96 (11/26 1630) Resp:  [0-31] 14 (11/26 1459) BP: (88-130)/(54-88) 107/66 (11/26 1630) SpO2:  [95 %-100 %] 100 % (11/26 1600) Weight:  [78.2 kg] 78.2 kg (11/26 0436)  Intake/Output from previous day: 11/25 0701 - 11/26 0700 In: 1109.2 [P.O.:820; I.V.:289.2] Out: 1375 [Urine:1375] Intake/Output from this shift: Total I/O In: 630.6 [P.O.:360; I.V.:270.6] Out: 300 [Urine:300]  Physical Exam: Nursing note and vitals reviewed. Constitutional: She is oriented to person, place, and time. She appears well-developed and well-nourished.  Eyes: Pupils are equal, round, and reactive to light. Conjunctivae are normal.  Neck: JVD present.  Cardiovascular: An irregularly irregular rhythm present. Tachycardia present.  Murmur heard.  Systolic (Mitral and tricuspid area) murmur is present with a grade of 3/6. Respiratory: Effort normal and breath sounds normal. She has no wheezes. She has no rales.  GI: Soft. Bowel sounds are normal. There is no tenderness. There is no rebound.  Musculoskeletal: She exhibits edema (Trace b/l).  Lymphadenopathy:    She has no cervical adenopathy.  Neurological: She is alert and oriented to person, place, and time.  Skin: Skin is warm and dry.  Psychiatric: She has a normal mood and affect.     Lab Results: Recent Labs    10/10/18 0447  WBC 8.5  HGB 13.8  PLT 187   Recent Labs    10/11/18 1029 10/12/18 0537  NA 139 139  K 3.7 3.5  CL 107 107  CO2 26 26  GLUCOSE 110* 99  BUN 23 21  CREATININE 1.41* 1.32*   Cardiac studies: EKG 10/08/2018: Afib w.RVR w/IVCD I do not see Afib on most other telemetry strips this admission, and predominantly has sinus tachcycardia, first degree AV block,  multiform PAC's.   Echocardiogram 09/25/2018: Study Conclusions  - Left ventricle: Wall thickness was increased in a pattern of moderate LVH. Systolic function was severely reduced. The estimated ejection fraction was in the range of 20% to 25%. Diffuse hypokinesis. The study is not technically sufficient to allow evaluation of LV diastolic function. - Ventricular septum: Septal motion showed abnormal function and dyssynergy. These changes are consistent with a left bundle branch block. - Mitral valve: Premature closure of the MV leaflets suggests elevated LVEDP. There was severe regurgitation directed centrally and posteriorly. - Left atrium: The atrium was moderately dilated. - Right ventricle: The cavity size was mildly dilated. - Right atrium: The atrium was mildly dilated. - Atrial septum: No defect or patent foramen ovale was identified. - Tricuspid valve: There was moderate-severe regurgitation. - Pulmonary arteries: PA peak pressure: 47 mm Hg (S).  Impressions:  - The right ventricular systolic pressure was increased consistent with moderate pulmonary hypertension.  R&LHC 10/12/2018: Normal coronary arteries with minimal luminal irregularities No angiographically significant coronary artery disease  RA: 18 mmHg RV: 44/9 mmHg, RVEDP 21 mmHg PA: 51/25 mmHg. 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 On Entresto 97-103 mg bid at home. Resume at 49-51 mg bid today. Reintroduce spironolactone 25 mg tomorrow. Uptitrate Entresto 97-103 mg bid outpatient. Continue metoprolol succinate 25 mg daily. Discussed with EP regarding cardiac resynchronization therapy  Severe MR, TR: Given her severe dilated cardiomyopathy with EF, she will not benefit from surgical correction. Hopefully, cardiac resynchronization therapy, in addition to GDMT, could help.  Paroxysmal Afib: New diagnosis.  CHA2DS2VASc score 5, annual stroke risk 7.2% Continue amiodarone 200 mg bid (with monitoring of intrinsically prolonged QTc due to her long QRS interval) for rate and rhyhthm control. Avoid diltiazem given her low EF.  Start eliquis 5 mg bid.  I discussed recommendations with the patient and her two daughters. As stated previously, diet and medication compliance remain of paramount importance.    LOS: 2 days    Erin Novak 10/12/2018, 4:38 PM  Pallie Swigert Esther Hardy, MD [facility] Pager: [phone] Office: [phone] If no answer Cell [phone]

## 2018-10-12 NOTE — Interval H&P Note (Signed)
History and Physical Interval Note:  10/12/2018 1:55 PM  Ladene Artist Rengel  has presented today for surgery, with the diagnosis of hf  The various methods of treatment have been discussed with the patient and family. After consideration of risks, benefits and other options for treatment, the patient has consented to  Procedure(s): RIGHT/LEFT HEART CATH AND CORONARY ANGIOGRAPHY (N/A) as a surgical intervention .  The patient's history has been reviewed, patient examined, no change in status, stable for surgery.  I have reviewed the patient's chart and labs.  Questions were answered to the patient's satisfaction.    2012 Appropriate Use Criteria for Diagnostic Catheterization Home / Select Test of Interest Indication for RHC Cardiomyopathies Cardiomyopathies (Right and Left Heart Catheterization OR Right Heart Catheterization Alone With/Wit Cardiomyopathies  (Right and Left Heart Catheterization OR  Right Heart Catheterization Alone With/Without Left Ventriculography and Coronary Angiography)  Link Here: [email or web address] Indication:  1. Known or suspected cardiomyopathy with or without heart failure A (7) Indication: 93; Score 7     Shafter Jupin J Nashalie Sallis

## 2018-10-12 NOTE — Progress Notes (Signed)
PROGRESS NOTE    Erin Novak  VQQ:[id number] DOB: 1941-05-25 DOA: 10/08/2018 PCP: [name], MD    Brief Narrative:  77 year old female who presented with dyspnea.  Significant past medical history for diastolic heart failure.  Patient initially presented to the emergency room with dyspnea, she was found in atrial fibrillation, received IV diltiazem and return to sinus rhythm, she was hospital discharged on apixaban.  At home she continued to have worsening dyspnea, worse with exertion, and that prompted her to return to emergency department.  Her initial call examination blood pressure 116/76, heart rate 108, respiratory rate 24, temperature 98.5, oxygen saturation 97%.  She had decreased breath sounds at bases, she was tachycardic, no gallops, rubs or murmurs, the abdomen soft nontender, no significant lower extremity edema.  Sodium 139, potassium 4.0, chloride 107, bicarb 21, glucose 110, BUN 23, creatinine 1.23, white count 8.3, hemoglobin 14.8, hematocrit 46.7, platelets 207, BNP 3301, chest radiograph with hilar vascular congestion, positive cardiomegaly, EKG sinus rhythm, right axis deviation, left bundle branch block, poor R wave progression.  Patient was admitted to the hospital with the working diagnosis of acute on chronic systolic heart failure decompensation   Assessment & Plan:   Principal Problem:   Acute on chronic systolic CHF (congestive heart failure) (HCC) Active Problems:   Hypothyroidism   Essential hypertension   Malignant neoplasm of upper-outer quadrant of left breast in female, estrogen receptor positive ([place])   New onset a-fib (HCC)   Acute CHF (congestive heart failure) ([place])   1. Acute on chronic systolic heart failure (EF 20 to 25%) Diuretic therapy with furosemide 40 mg daily, tolerating well Bidil and metoprolol Xl. Systolic 433 to 295 mmHg. For diagnostic cardiac catheterization, right and left today.    2. Atrial fibrillation. On amiodarone and  metoprolol, for rate control. Resume anticoagulation with apixaban post cardiac catheterization per cardiology timing.    3. HTN. On hydralazine/ isosorbide and metoprolol, with good toleration.   4. Hypothyroid. On levothyroxine.  5. History of breast cancer. Follow as outpatient. Continue Anastrozole.   6. Right axillar folicullitis. Improved symptoms, continue topical antibiotic therapy.  7. Dyslipidemia. Continue statin therapy with atorvastatin.   DVT prophylaxis: apixaban    Code Status: full Family Communication: no family at the bedside  Disposition Plan/ discharge barriers: pending cardiac workup.   Body mass index is 30.56 kg/m. Malnutrition Type:      Malnutrition Characteristics:      Nutrition Interventions:     RN Pressure Injury Documentation:     Consultants:   Cardiology   Procedures:     Antimicrobials:       Subjective: Patient is feeling better, no nausea or vomiting, right axillar pain sill persistent, but improved in intensity, no chest pain or dyspnea.   Objective: Vitals:   10/12/18 0059 10/12/18 0213 10/12/18 0436 10/12/18 0834  BP: (!) 88/66 (!) 95/54 98/70 112/71  Pulse: 89 93 88 86  Resp: 20  20 18   Temp: 98.2 F (36.8 C)  (!) 97.2 F (36.2 C) (!) 97.3 F (36.3 C)  TempSrc: Oral  Oral Oral  SpO2: 98% 96% 96% 95%  Weight:   78.2 kg   Height:        Intake/Output Summary (Last 24 hours) at 10/12/2018 0917 Last data filed at 10/12/2018 0552 Gross per 24 hour  Intake 889.16 ml  Output 1375 ml  Net -485.84 ml   Filed Weights   10/10/18 0527 10/11/18 0048 10/12/18 0436  Weight: 78.2  kg 78.4 kg 78.2 kg    Examination:   General: not in pain or dyspnea,  Neurology: Awake and alert, non focal  E ENT: mild pallor, no icterus, oral mucosa moist Cardiovascular: No JVD. S1-S2 present, rhythmic, no gallops, rubs, or murmurs. Trace lower extremity edema. Pulmonary: positive breath sounds bilaterally, adequate  air movement, no wheezing, rhonchi or rales. Gastrointestinal. Abdomen with no organomegaly, non tender, no rebound or guarding Skin. Right axillary nodule, with decreased erythema and tenderness.  Musculoskeletal: no joint deformities     Data Reviewed: I have personally reviewed following labs and imaging studies  CBC: Recent Labs  Lab 10/08/18 1012 10/08/18 1902 10/10/18 0447  WBC 8.5 8.3 8.5  NEUTROABS 5.8 5.2  --   HGB 14.7 14.8 13.8  HCT 47.1* 46.7* 44.4  MCV 97.3 97.5 98.4  PLT 206 205 846   Basic Metabolic Panel: Recent Labs  Lab 10/08/18 1012 10/08/18 1902 10/09/18 0437 10/10/18 0447 10/11/18 1029 10/12/18 0537  NA 138 139 139 139 139 139  K 3.8 4.0 3.9 3.7 3.7 3.5  CL 107 107 107 107 107 107  CO2 21* 21* 21* 23 26 26   GLUCOSE 134* 110* 126* 97 110* 99  BUN 23 23 27* 31* 23 21  CREATININE 1.26* 1.23* 1.27* 1.53* 1.41* 1.32*  CALCIUM 9.0 8.9 9.1 8.8* 8.6* 8.4*  MG 2.0 1.8  --   --   --   --    GFR: Estimated Creatinine Clearance: 34.8 mL/min (A) (by C-G formula based on SCr of 1.32 mg/dL (H)). Liver Function Tests: Recent Labs  Lab 10/08/18 1902  AST 25  ALT 23  ALKPHOS 47  BILITOT 0.9  PROT 6.6  ALBUMIN 3.6   No results for input(s): LIPASE, AMYLASE in the last 168 hours. No results for input(s): AMMONIA in the last 168 hours. Coagulation Profile: No results for input(s): INR, PROTIME in the last 168 hours. Cardiac Enzymes: No results for input(s): CKTOTAL, CKMB, CKMBINDEX, TROPONINI in the last 168 hours. BNP (last 3 results) No results for input(s): PROBNP in the last 8760 hours. HbA1C: No results for input(s): HGBA1C in the last 72 hours. CBG: No results for input(s): GLUCAP in the last 168 hours. Lipid Profile: No results for input(s): CHOL, HDL, LDLCALC, TRIG, CHOLHDL, LDLDIRECT in the last 72 hours. Thyroid Function Tests: No results for input(s): TSH, T4TOTAL, FREET4, T3FREE, THYROIDAB in the last 72 hours. Anemia Panel: No  results for input(s): VITAMINB12, FOLATE, FERRITIN, TIBC, IRON, RETICCTPCT in the last 72 hours.    Radiology Studies: I have reviewed all of the imaging during this hospital visit personally     Scheduled Meds: . amiodarone  200 mg Oral BID  . anastrozole  1 mg Oral Daily  . atorvastatin  10 mg Oral Daily  . furosemide  40 mg Oral Daily  . isosorbide-hydrALAZINE  1 tablet Oral TID  . levothyroxine  88 mcg Oral BH-q7a  . loratadine  10 mg Oral Daily  . metoprolol succinate  25 mg Oral Daily  . neomycin-bacitracin-polymyxin   Topical TID  . sodium chloride flush  3 mL Intravenous Q12H   Continuous Infusions: . sodium chloride    . sodium chloride 0.5 mL/kg/hr (10/11/18 2042)     LOS: 2 days        Mauricio Gerome Apley, MD [facility] Pager [phone]

## 2018-10-12 NOTE — Progress Notes (Signed)
ANTICOAGULATION CONSULT NOTE - Initial Consult  Pharmacy Consult:  Eliquis Indication: atrial fibrillation  Allergies  Allergen Reactions  . Lisinopril Cough  . Percodan [Oxycodone-Aspirin] Nausea And Vomiting and Other (See Comments)    Sweating, unable to sleep  . Prednisone Other (See Comments)    Makes pt sweat, unable to sleep  . Amoxicillin-Pot Clavulanate Rash    Has patient had a PCN reaction causing immediate rash, facial/tongue/throat swelling, SOB or lightheadedness with hypotension: Unknown Has patient had a PCN reaction causing severe rash involving mucus membranes or skin necrosis: Unknown Has patient had a PCN reaction that required hospitalization: Unknown Has patient had a PCN reaction occurring within the last 10 years: No If all of the above answers are "NO", then may proceed with Cephalosporin use.     Patient Measurements: Height: 5\' 3"  (160 cm) Weight: 172 lb 8 oz (78.2 kg) IBW/kg (Calculated) : 52.4  Vital Signs: Temp: 97.3 F (36.3 C) (11/26 1135) Temp Source: Oral (11/26 1135) BP: 107/66 (11/26 1630) Pulse Rate: 96 (11/26 1630)  Labs: Recent Labs    10/10/18 0447 10/11/18 1029 10/12/18 0537  HGB 13.8  --   --   HCT 44.4  --   --   PLT 187  --   --   CREATININE 1.53* 1.41* 1.32*    Estimated Creatinine Clearance: 34.8 mL/min (A) (by C-G formula based on SCr of 1.32 mg/dL (H)).   Medical History: Past Medical History:  Diagnosis Date  . Abdominal pain   . Allergy   . Asthma   . Bronchitis   . Cholelithiasis 06-20-2011   [place]   . Diarrhea   . GERD (gastroesophageal reflux disease)   . Hyperlipidemia   . Hypertension   . Hypothyroidism   . Migraines   . Personal history of radiation therapy 2018  . PONV (postoperative nausea and vomiting)   . Thyroid disease    hypothyroid  . Ulcer       Assessment: 64 YOF presented with SOB and found to have new-onset Afib.  She was started on Eliquis, which was then held for cath today  10/12/18.  Cath revealed NICM with pulmonary HTN.  Pharmacy consulted to resume Eliquis tomorrow 10/13/18.  Patient qualifies for full dosing based on parameters of age < 39, SCr < 1.5 and weight > 60kg.    Goal of Therapy:  Appropriate anticoagulation Monitor platelets by anticoagulation protocol: Yes   Plan:  Eliquis 5mg  PO BID Pharmacy will sign off and follow peripherally.  Thank you for the consult!   Demiyah Fischbach D. Mina Marble, PharmD, BCPS, [place] 10/12/2018, 5:26 PM

## 2018-10-13 ENCOUNTER — Encounter (HOSPITAL_COMMUNITY): Payer: Self-pay | Admitting: Cardiology

## 2018-10-13 LAB — BASIC METABOLIC PANEL
Anion gap: 7 (ref 5–15)
BUN: 15 mg/dL (ref 8–23)
CHLORIDE: 108 mmol/L (ref 98–111)
CO2: 25 mmol/L (ref 22–32)
Calcium: 8.7 mg/dL — ABNORMAL LOW (ref 8.9–10.3)
Creatinine, Ser: 1.15 mg/dL — ABNORMAL HIGH (ref 0.44–1.00)
GFR calc Af Amer: 53 mL/min — ABNORMAL LOW (ref >60)
GFR calc non Af Amer: 46 mL/min — ABNORMAL LOW (ref >60)
GLUCOSE: 97 mg/dL (ref 70–99)
POTASSIUM: 3.7 mmol/L (ref 3.5–5.1)
Sodium: 140 mmol/L (ref 135–145)

## 2018-10-13 MED ORDER — SACUBITRIL-VALSARTAN 49-51 MG PO TABS: 1.0000 | ORAL_TABLET | Freq: Two times a day (BID) | ORAL | 0 refills | Status: AC

## 2018-10-13 MED ORDER — AMIODARONE HCL 200 MG PO TABS: 200.0000 mg | ORAL_TABLET | Freq: Two times a day (BID) | ORAL | 0 refills | Status: DC

## 2018-10-13 MED ORDER — SPIRONOLACTONE 25 MG PO TABS
25.0000 mg | ORAL_TABLET | Freq: Every day | ORAL | Status: DC
  Administered 2018-10-13: 25 mg via ORAL
  Filled 2018-10-13: qty 1

## 2018-10-13 NOTE — Discharge Instructions (Signed)

## 2018-10-13 NOTE — Discharge Summary (Signed)
Physician Discharge Summary  Erin Novak LTJ:[id number] DOB: May 07, 1941 DOA: 10/08/2018  PCP: [name], MD  Admit date: 10/08/2018 Discharge date: 10/13/2018  Admitted From: Home  Disposition:  Home   Recommendations for Outpatient Follow-up and new medication changes:  1. Follow up with Dr. Maudie Mercury in 7 days.  2. Patient started on spironolactone.  3. Entresto starting dose 49-51 bid, up-titration as outpatient.  4. Started on amiodarone for atrial fibrillation.  Home Health: no   Equipment/Devices: no    Discharge Condition: stable CODE STATUS: full  Diet recommendation: heart healthy   Brief/Interim Summary: 77 year old female who presented with dyspnea. She does have thesignificant past medical history for systolic heart failure. Patient initially presented to the emergency room with dyspnea, she was found in atrial fibrillation, received IV diltiazem and return to sinus rhythm, she was discharged from the ED on apixaban. At home she continued to have worseningdyspnea, worse with exertion, and that prompted her to return to emergency department.Her initial physical examination blood pressure 116/76, heart rate 108, respiratory rate24, temperature 98.5, oxygen saturation 97%.She had decreased breath sounds at bases, she was tachycardic, no gallops, rubs or murmurs, the abdomen soft nontender, no significant lower extremity edema.Sodium 139, potassium 4.0, chloride 107, bicarb 21, glucose 110, BUN 23, creatinine 1.23, white count 8.3, hemoglobin 14.8, hematocrit 46.7, platelets 207, BNP 3301,chest radiograph with hilar vascular congestion, positive cardiomegaly,EKG sinus rhythm, right axis deviation, left bundle branch block,poor R wave progression.  Patient was admitted to the hospital with the working diagnosis of acute on chronicsystolicheart failure decompensation  1.  Acute on chronic systolic heart failure exacerbation (left ventricle ejection fraction 20 to 25%  with diffuse hypokinesis).  Patient was admitted to the medical ward, she was placed on a remote telemetry monitor, received aggressive diuresis with IV furosemide, negative fluid balance was achieved with significant improvement of her symptoms.  Patient underwent further work-up with cardiac catheterization, hemodynamics: RA 18, RV 44/9, PA 51/25, mean 33, wedge pressure 20, LV 108/15, LVEDP 23, cardiac output 4.7, cardiac index 2.6, coronary angiography with no significant stenosis.  Patient was placed on aggressive guideline directed medical therapy including beta-blockade, Entresto, Aldactone, and diuresis with furosemide.  Outpatient follow-up for possible cardiac resynchronization.  2.  Paroxysmal atrial fibrillation.  Rate controlled with amiodarone and metoprolol, anticoagulation with apixaban.  3.  Hypertension.  Continue blood pressure control with Entresto and metoprolol.  4.  Dyslipidemia.  Continue atorvastatin.  5.  Hypothyroidism.  Continue levothyroxine.  6.  Hx of breast cancer.  Continue anastrozole.  7.  Right axillary folliculitis.  Continue topical antibiotic therapy.  8. Right axillary folliculitis. Patient received topical antibiotic with improvement.    Discharge Diagnoses:  Principal Problem:   Acute on chronic systolic CHF (congestive heart failure) (HCC) Active Problems:   Hypothyroidism   Essential hypertension   Malignant neoplasm of upper-outer quadrant of left breast in female, estrogen receptor positive ([place])   New onset a-fib ([place])   Acute CHF (congestive heart failure) ([place])    Discharge Instructions   Allergies as of 10/13/2018      Reactions   Lisinopril Cough   Percodan [oxycodone-aspirin] Nausea And Vomiting, Other (See Comments)   Sweating, unable to sleep   Prednisone Other (See Comments)   Makes pt sweat, unable to sleep   Amoxicillin-pot Clavulanate Rash   Has patient had a PCN reaction causing immediate rash, facial/tongue/throat  swelling, SOB or lightheadedness with hypotension: Unknown Has patient had a PCN reaction causing severe  rash involving mucus membranes or skin necrosis: Unknown Has patient had a PCN reaction that required hospitalization: Unknown Has patient had a PCN reaction occurring within the last 10 years: No If all of the above answers are "NO", then may proceed with Cephalosporin use.      Medication List    STOP taking these medications   acetaminophen 500 MG tablet Commonly known as:  TYLENOL   sacubitril-valsartan 97-103 MG Commonly known as:  ENTRESTO Replaced by:  sacubitril-valsartan 49-51 MG     TAKE these medications   amiodarone 200 MG tablet Commonly known as:  PACERONE Take 1 tablet (200 mg total) by mouth 2 (two) times daily.   anastrozole 1 MG tablet Commonly known as:  ARIMIDEX TAKE 1 TABLET BY MOUTH ONCE DAILY What changed:  Another medication with the same name was removed. Continue taking this medication, and follow the directions you see here.   apixaban 5 MG Tabs tablet Commonly known as:  ELIQUIS Take 1 tablet (5 mg total) by mouth 2 (two) times daily.   atorvastatin 10 MG tablet Commonly known as:  LIPITOR Take 10 mg by mouth daily.   dextromethorphan 30 MG/5ML liquid Commonly known as:  DELSYM Take 30 mg by mouth 2 (two) times daily as needed for cough.   furosemide 40 MG tablet Commonly known as:  LASIX Take 1 tablet (40 mg total) by mouth daily.   levothyroxine 88 MCG tablet Commonly known as:  SYNTHROID, LEVOTHROID Take 88 mcg by mouth every morning.   loratadine 10 MG tablet Commonly known as:  CLARITIN Take 10 mg by mouth daily.   metoprolol succinate 25 MG 24 hr tablet Commonly known as:  TOPROL-XL Take 1 tablet (25 mg total) by mouth daily.   mometasone-formoterol 100-5 MCG/ACT Aero Commonly known as:  DULERA Inhale 2 puffs into the lungs daily as needed for wheezing.   sacubitril-valsartan 49-51 MG Commonly known as:  ENTRESTO Take  1 tablet by mouth 2 (two) times daily. Replaces:  sacubitril-valsartan 97-103 MG   spironolactone 25 MG tablet Commonly known as:  ALDACTONE Take 1 tablet (25 mg total) by mouth daily.      Follow-up Information    Nigel Mormon, MD Follow up on 10/18/2018.   Specialty:  Cardiology Why:  1:30 PM Contact information: [place] [address] [phone]        Evans Lance, MD Follow up.   Specialty:  Cardiology Why:  10/20/18 @ 10:00AM Contact information: [address] [place] [address] [phone]          Allergies  Allergen Reactions  . Lisinopril Cough  . Percodan [Oxycodone-Aspirin] Nausea And Vomiting and Other (See Comments)    Sweating, unable to sleep  . Prednisone Other (See Comments)    Makes pt sweat, unable to sleep  . Amoxicillin-Pot Clavulanate Rash    Has patient had a PCN reaction causing immediate rash, facial/tongue/throat swelling, SOB or lightheadedness with hypotension: Unknown Has patient had a PCN reaction causing severe rash involving mucus membranes or skin necrosis: Unknown Has patient had a PCN reaction that required hospitalization: Unknown Has patient had a PCN reaction occurring within the last 10 years: No If all of the above answers are "NO", then may proceed with Cephalosporin use.     Consultations:  Cardiology    Procedures/Studies: Dg Chest 2 View  Result Date: 10/08/2018 CLINICAL DATA:  Diagnosis from the emergency department with new diagnosis of atrial fibrillation  today. Increasing shortness of breath and fatigue at home. EXAM: CHEST - 2 VIEW COMPARISON:  10/08/2018 and 10/01/2018 radiographs. FINDINGS: 1928 hours. Stable cardiomegaly, tiny bilateral pleural effusions and mild interstitial prominence. There is no invert pulmonary edema, confluent airspace opacity or pneumothorax. Telemetry leads overlie the chest. No acute osseous findings. IMPRESSION: Stable chest with  cardiomegaly and trace bilateral pleural effusions. Electronically Signed   By: Richardean Sale M.D.   On: 10/08/2018 19:47   Dg Chest 2 View  Result Date: 10/08/2018 CLINICAL DATA:  Fatigue and shortness of breath. EXAM: CHEST - 2 VIEW COMPARISON:  10/01/2018 and prior radiographs FINDINGS: Cardiomegaly again noted. Trace bilateral pleural effusions are again noted. There is no evidence of focal airspace disease, pulmonary edema, suspicious pulmonary nodule/mass or pneumothorax. No acute bony abnormalities are identified. IMPRESSION: Cardiomegaly with trace bilateral pleural effusions. No significant change. Electronically Signed   By: Margarette Canada M.D.   On: 10/08/2018 10:31   Dg Chest 2 View  Result Date: 10/01/2018 CLINICAL DATA:  Shortness of breath, cough, and fatigue since being discharged 3 days ago for hypokalemia. History of atrial fibrillation, asthma. EXAM: CHEST - 2 VIEW COMPARISON:  Chest x-ray of September 24, 2018 FINDINGS: The lungs are adequately inflated. There is no focal infiltrate. There is a tiny amount of pleural fluid bilaterally. The cardiac silhouette is enlarged. The pulmonary vascularity is mildly engorged. The bony thorax exhibits no acute abnormality. IMPRESSION: CHF with trace bilateral pleural effusions and very mild interstitial edema. No alveolar pneumonia. Electronically Signed   By: David  Martinique M.D.   On: 10/01/2018 11:42   Dg Chest 2 View  Result Date: 09/24/2018 CLINICAL DATA:  Shortness of breath.  Increased fatigue. EXAM: CHEST - 2 VIEW COMPARISON:  Two-view chest x-ray 12/23/2011. FINDINGS: There is marked interval enlargement of the heart. Mild pulmonary vascular congestion is present without frank edema. Small effusions are present. Progressive endplate degenerative changes are present in the thoracic spine. Vertebral body heights are maintained. IMPRESSION: 1. Progressive cardiomegaly and pulmonary vascular congestion consistent with early congestive heart  failure. 2. Small bilateral pleural effusions. Electronically Signed   By: San Morelle M.D.   On: 09/24/2018 20:23       Subjective: Patient is feeling better, no nausea or vomiting, no chest pain, palpitations or dyspnea, no lower extremity edema.   Discharge Exam: Vitals:   10/13/18 0033 10/13/18 0542  BP: (!) 107/57 118/64  Pulse: 89 73  Resp: 18 18  Temp: 98.9 F (37.2 C) 98.3 F (36.8 C)  SpO2: 93% 95%   Vitals:   10/12/18 1800 10/12/18 1915 10/13/18 0033 10/13/18 0542  BP: 104/67 115/66 (!) 107/57 118/64  Pulse: 98 (!) 101 89 73  Resp:  16 18 18   Temp:  97.7 F (36.5 C) 98.9 F (37.2 C) 98.3 F (36.8 C)  TempSrc:  Oral Oral Oral  SpO2:  99% 93% 95%  Weight:   78 kg   Height:        General: Not in pain or dyspnea  Neurology: Awake and alert, non focal  E ENT: no pallor, no icterus, oral mucosa moist Cardiovascular: No JVD. S1-S2 present, rhythmic, no gallops, rubs, or murmurs. Trace lower extremity edema. Pulmonary: positive breath sounds bilaterally, adequate air movement, no wheezing, rhonchi or rales. Gastrointestinal. Abdomen with no organomegaly, non tender, no rebound or guarding Skin. No rashes Musculoskeletal: no joint deformities   The results of significant diagnostics from this hospitalization (including imaging, microbiology, ancillary and laboratory)  are listed below for reference.     Microbiology: No results found for this or any previous visit (from the past 240 hour(s)).   Labs: BNP (last 3 results) Recent Labs    10/08/18 1012 10/09/18 1818 10/11/18 0938  BNP 2,876.6* 3,381.4* 832.5*   Basic Metabolic Panel: Recent Labs  Lab 10/08/18 1012 10/08/18 1902 10/09/18 0437 10/10/18 0447 10/11/18 1029 10/12/18 0537 10/13/18 0530  NA 138 139 139 139 139 139 140  K 3.8 4.0 3.9 3.7 3.7 3.5 3.7  CL 107 107 107 107 107 107 108  CO2 21* 21* 21* 23 26 26 25   GLUCOSE 134* 110* 126* 97 110* 99 97  BUN 23 23 27* 31* 23 21 15    CREATININE 1.26* 1.23* 1.27* 1.53* 1.41* 1.32* 1.15*  CALCIUM 9.0 8.9 9.1 8.8* 8.6* 8.4* 8.7*  MG 2.0 1.8  --   --   --   --   --    Liver Function Tests: Recent Labs  Lab 10/08/18 1902  AST 25  ALT 23  ALKPHOS 47  BILITOT 0.9  PROT 6.6  ALBUMIN 3.6   No results for input(s): LIPASE, AMYLASE in the last 168 hours. No results for input(s): AMMONIA in the last 168 hours. CBC: Recent Labs  Lab 10/08/18 1012 10/08/18 1902 10/10/18 0447  WBC 8.5 8.3 8.5  NEUTROABS 5.8 5.2  --   HGB 14.7 14.8 13.8  HCT 47.1* 46.7* 44.4  MCV 97.3 97.5 98.4  PLT 206 205 187   Cardiac Enzymes: No results for input(s): CKTOTAL, CKMB, CKMBINDEX, TROPONINI in the last 168 hours. BNP: Invalid input(s): POCBNP CBG: No results for input(s): GLUCAP in the last 168 hours. D-Dimer No results for input(s): DDIMER in the last 72 hours. Hgb A1c No results for input(s): HGBA1C in the last 72 hours. Lipid Profile No results for input(s): CHOL, HDL, LDLCALC, TRIG, CHOLHDL, LDLDIRECT in the last 72 hours. Thyroid function studies No results for input(s): TSH, T4TOTAL, T3FREE, THYROIDAB in the last 72 hours.  Invalid input(s): FREET3 Anemia work up No results for input(s): VITAMINB12, FOLATE, FERRITIN, TIBC, IRON, RETICCTPCT in the last 72 hours. Urinalysis    Component Value Date/Time   COLORURINE YELLOW 12/23/2011 1436   APPEARANCEUR CLOUDY (A) 12/23/2011 1436   LABSPEC 1.017 12/23/2011 1436   PHURINE 7.5 12/23/2011 1436   GLUCOSEU NEGATIVE 12/23/2011 1436   HGBUR NEGATIVE 12/23/2011 1436   BILIRUBINUR NEGATIVE 12/23/2011 1436   KETONESUR NEGATIVE 12/23/2011 1436   PROTEINUR NEGATIVE 12/23/2011 1436   UROBILINOGEN 0.2 12/23/2011 1436   NITRITE NEGATIVE 12/23/2011 1436   LEUKOCYTESUR NEGATIVE 12/23/2011 1436   Sepsis Labs Invalid input(s): PROCALCITONIN,  WBC,  LACTICIDVEN Microbiology No results found for this or any previous visit (from the past 240 hour(s)).   Time coordinating  discharge: 45 minutes  SIGNED:   Tawni Millers, MD  [facility] 10/13/2018, 9:02 AM Pager [phone]  If 7PM-7AM, please contact night-coverage [email or web address] Password [email or web address]

## 2018-10-13 NOTE — Care Management Important Message (Signed)
Important Message  Patient Details  Name: Erin Novak MRN: [id number] Date of Birth: Mar 19, 1941   Medicare Important Message Given:  Yes    Erenest Rasher, RN 10/13/2018, 11:08 AM

## 2018-10-13 NOTE — Progress Notes (Signed)
Subjective:  Remains asymptomatic at rest   Objective:  Vital Signs in the last 24 hours: Temp:  [97.3 F (36.3 C)-99.2 F (37.3 C)] 99.2 F (37.3 C) (11/27 0912) Pulse Rate:  [69-161] 85 (11/27 0912) Resp:  [0-31] 16 (11/27 0912) BP: (104-130)/(57-88) 118/70 (11/27 0912) SpO2:  [93 %-100 %] 98 % (11/27 0912) Weight:  [78 kg] 78 kg (11/27 0033)  Intake/Output from previous day: 11/26 0701 - 11/27 0700 In: 1191.6 [P.O.:720; I.V.:471.6] Out: 1500 [Urine:1500] Intake/Output from this shift: Total I/O In: 240 [P.O.:240] Out: 400 [Urine:400]  Physical Exam: Nursing note and vitals reviewed. Constitutional: She is oriented to person, place, and time. She appears well-developed and well-nourished.  Eyes: Pupils are equal, round, and reactive to light. Conjunctivae are normal.  Neck: No JVD.  Cardiovascular: Regular rhythm present.Murmur heard.  Systolic (Mitral and tricuspid area) murmur is present with a grade of 3/6. Respiratory: Effort normal and breath sounds normal. She has no wheezes. She has no rales.  GI: Soft. Bowel sounds are normal. There is no tenderness. There is no rebound.  Musculoskeletal: She exhibits edema (Trace b/l).  Lymphadenopathy:    She has no cervical adenopathy.  Neurological: She is alert and oriented to person, place, and time.  Skin: Skin is warm and dry.  Psychiatric: She has a normal mood and affect.     Lab Results: No results for input(s): WBC, HGB, PLT in the last 72 hours. Recent Labs    10/12/18 0537 10/13/18 0530  NA 139 140  K 3.5 3.7  CL 107 108  CO2 26 25  GLUCOSE 99 97  BUN 21 15  CREATININE 1.32* 1.15*   Cardiac studies: EKG 10/08/2018: Afib w.RVR w/IVCD I do not see Afib on most other telemetry strips this admission, and predominantly has sinus tachcycardia, first degree AV block, multiform PAC's.   Echocardiogram 09/25/2018: Study Conclusions  - Left ventricle: Wall thickness was increased in a pattern  of moderate LVH. Systolic function was severely reduced. The estimated ejection fraction was in the range of 20% to 25%. Diffuse hypokinesis. The study is not technically sufficient to allow evaluation of LV diastolic function. - Ventricular septum: Septal motion showed abnormal function and dyssynergy. These changes are consistent with a left bundle branch block. - Mitral valve: Premature closure of the MV leaflets suggests elevated LVEDP. There was severe regurgitation directed centrally and posteriorly. - Left atrium: The atrium was moderately dilated. - Right ventricle: The cavity size was mildly dilated. - Right atrium: The atrium was mildly dilated. - Atrial septum: No defect or patent foramen ovale was identified. - Tricuspid valve: There was moderate-severe regurgitation. - Pulmonary arteries: PA peak pressure: 47 mm Hg (S).  Impressions:  - The right ventricular systolic pressure was increased consistent with moderate pulmonary hypertension.  R&LHC 10/12/2018: Normal coronary arteries with minimal luminal irregularities No angiographically significant coronary artery disease  RA: 18 mmHg RV: 44/9 mmHg, RVEDP 21 mmHg PA: 51/25 mmHg. 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 On Entresto 97-103 mg bid at home. With recent  interruption and low normal blood pressure, recommend discharge on Entresto 49-51 mg bid today and spironolactone 25 mg tomorrow. Uptitrate Entresto 97-103 mg bid outpatient. Continue metoprolol succinate 25 mg daily. Discussed with EP regarding cardiac resynchronization therapy. Patient will see Dr. Lovena Le on 10/20/18.  Severe MR, TR: Given her severe dilated cardiomyopathy with EF, she will not benefit from surgical correction. Hopefully, cardiac resynchronization therapy, in addition to GDMT, could help.  Paroxysmal Afib: New diagnosis.  CHA2DS2VASc score 5, annual stroke risk 7.2% Continue amiodarone 200 mg bid (with monitoring of intrinsically prolonged QTc due to her long QRS interval) for rate and rhyhthm control. Avoid diltiazem given her low EF.  Start eliquis 5 mg bid.  I discussed recommendations with the patient and her two daughters. As stated previously, diet and medication compliance remain of paramount importance.    LOS: 3 days    Erin Novak Erin Novak 10/13/2018, 9:26 AM  [place], MD [facility] Pager: [phone] Office: [phone] If no answer Cell [phone]

## 2018-10-15 MED FILL — Verapamil HCl IV Soln 2.5 MG/ML: INTRAVENOUS | Qty: 2 | Status: AC

## 2018-10-18 DIAGNOSIS — I10 Essential (primary) hypertension: Secondary | ICD-10-CM | POA: Diagnosis not present

## 2018-10-18 DIAGNOSIS — I428 Other cardiomyopathies: Secondary | ICD-10-CM | POA: Diagnosis not present

## 2018-10-18 DIAGNOSIS — I5042 Chronic combined systolic (congestive) and diastolic (congestive) heart failure: Secondary | ICD-10-CM | POA: Diagnosis not present

## 2018-10-18 DIAGNOSIS — I447 Left bundle-branch block, unspecified: Secondary | ICD-10-CM | POA: Diagnosis not present

## 2018-10-20 ENCOUNTER — Ambulatory Visit (INDEPENDENT_AMBULATORY_CARE_PROVIDER_SITE_OTHER): Payer: Medicare Other | Admitting: Internal Medicine

## 2018-10-20 ENCOUNTER — Encounter: Payer: Self-pay | Admitting: Internal Medicine

## 2018-10-20 VITALS — BP 110/64 | HR 76 | Ht 63.0 in | Wt 169.0 lb

## 2018-10-20 DIAGNOSIS — I5022 Chronic systolic (congestive) heart failure: Secondary | ICD-10-CM

## 2018-10-20 DIAGNOSIS — I420 Dilated cardiomyopathy: Secondary | ICD-10-CM | POA: Diagnosis not present

## 2018-10-20 DIAGNOSIS — I4891 Unspecified atrial fibrillation: Secondary | ICD-10-CM

## 2018-10-20 NOTE — Progress Notes (Signed)
HPI Erin Novak is referred today by Dr. Lorre Nick for consideration for a biv device. She is a pleasant 77 yo woman with a h/o chronic systolic heart failure, class 3, on maximal medical therapy. She has LBBB with a QRS duration of 172. She has not had syncope and no peripheral edema.  Allergies  Allergen Reactions  . Lisinopril Cough  . Percodan [Oxycodone-Aspirin] Nausea And Vomiting and Other (See Comments)    Sweating, unable to sleep  . Prednisone Other (See Comments)    Makes pt sweat, unable to sleep  . Amoxicillin-Pot Clavulanate Rash    Has patient had a PCN reaction causing immediate rash, facial/tongue/throat swelling, SOB or lightheadedness with hypotension: Unknown Has patient had a PCN reaction causing severe rash involving mucus membranes or skin necrosis: Unknown Has patient had a PCN reaction that required hospitalization: Unknown Has patient had a PCN reaction occurring within the last 10 years: No If all of the above answers are "NO", then may proceed with Cephalosporin use.      Current Outpatient Medications  Medication Sig Dispense Refill  . amiodarone (PACERONE) 200 MG tablet Take 1 tablet (200 mg total) by mouth 2 (two) times daily. 60 tablet 0  . anastrozole (ARIMIDEX) 1 MG tablet TAKE 1 TABLET BY MOUTH ONCE DAILY (Patient taking differently: Take 1 mg by mouth daily. ) 90 tablet 0  . apixaban (ELIQUIS) 5 MG TABS tablet Take 1 tablet (5 mg total) by mouth 2 (two) times daily. 60 tablet 0  . atorvastatin (LIPITOR) 10 MG tablet Take 10 mg by mouth daily.     Marland Kitchen dextromethorphan (DELSYM) 30 MG/5ML liquid Take 30 mg by mouth 2 (two) times daily as needed for cough.    . furosemide (LASIX) 40 MG tablet Take 1 tablet (40 mg total) by mouth daily. 30 tablet 0  . levothyroxine (SYNTHROID, LEVOTHROID) 88 MCG tablet Take 88 mcg by mouth every morning.     . loratadine (CLARITIN) 10 MG tablet Take 10 mg by mouth daily.    . metoprolol succinate (TOPROL-XL) 25 MG 24  hr tablet Take 1 tablet (25 mg total) by mouth daily. 30 tablet 0  . mometasone-formoterol (DULERA) 100-5 MCG/ACT AERO Inhale 2 puffs into the lungs daily as needed for wheezing.    . sacubitril-valsartan (ENTRESTO) 49-51 MG Take 1 tablet by mouth 2 (two) times daily. 60 tablet 0  . spironolactone (ALDACTONE) 25 MG tablet Take 1 tablet (25 mg total) by mouth daily. 60 tablet 0   No current facility-administered medications for this visit.      Past Medical History:  Diagnosis Date  . Abdominal pain   . Allergy   . Asthma   . Bronchitis   . Cholelithiasis 06-20-2011   [place]   . Diarrhea   . GERD (gastroesophageal reflux disease)   . Hyperlipidemia   . Hypertension   . Hypothyroidism   . Migraines   . Personal history of radiation therapy 2018  . PONV (postoperative nausea and vomiting)   . Thyroid disease    hypothyroid  . Ulcer     ROS:   All systems reviewed and negative except as noted in the HPI.   Past Surgical History:  Procedure Laterality Date  . ABDOMINAL HYSTERECTOMY    . BREAST LUMPECTOMY Left 03/03/2017  . BREAST LUMPECTOMY WITH RADIOACTIVE SEED AND SENTINEL LYMPH NODE BIOPSY Left 03/03/2017   Procedure: LEFT BREAST LUMPECTOMY WITH RADIOACTIVE SEED X2 AND SENTINEL LYMPH NODE BIOPSY;  Surgeon: Fanny Skates, MD;  Location: [facility];  Service: General;  Laterality: Left;  . CHOLECYSTECTOMY  12/25/2011   Procedure: LAPAROSCOPIC CHOLECYSTECTOMY WITH INTRAOPERATIVE CHOLANGIOGRAM;  Surgeon: Earnstine Regal, MD;  Location: [facility];  Service: General;  Laterality: N/A;  laparoscopic cholecystectomy with intraoperative cholangiogram  . COLONOSCOPY  01/10/2003   internal hemorrhoids (Dr. Lajoyce Corners)  . FOOT SURGERY     right  . hysterectomy    . RIGHT/LEFT HEART CATH AND CORONARY ANGIOGRAPHY N/A 10/12/2018   Procedure: RIGHT/LEFT HEART CATH AND CORONARY ANGIOGRAPHY;  Surgeon: Nigel Mormon, MD;  Location: [facility];  Service: Cardiovascular;   Laterality: N/A;  . ruptured disk     in neck  . TONSILLECTOMY    . UPPER GASTROINTESTINAL ENDOSCOPY  01/10/2003   hiatal hernia (Dr. Lajoyce Corners)     Family History  Problem Relation Age of Onset  . Asthma Father   . Colon cancer Neg Hx   . Stomach cancer Neg Hx   . Esophageal cancer Neg Hx   . Rectal cancer Neg Hx      Social History   Socioeconomic History  . Marital status: Married    Spouse name: Not on file  . Number of children: 2  . Years of education: Not on file  . Highest education level: Not on file  Occupational History  . Occupation: Housewife  Social Needs  . Financial resource strain: Not on file  . Food insecurity:    Worry: Not on file    Inability: Not on file  . Transportation needs:    Medical: Not on file    Non-medical: Not on file  Tobacco Use  . Smoking status: Never Smoker  . Smokeless tobacco: Never Used  Substance and Sexual Activity  . Alcohol use: Yes    Comment: rare  . Drug use: No  . Sexual activity: Never  Lifestyle  . Physical activity:    Days per week: Not on file    Minutes per session: Not on file  . Stress: Not on file  Relationships  . Social connections:    Talks on phone: Not on file    Gets together: Not on file    Attends religious service: Not on file    Active member of club or organization: Not on file    Attends meetings of clubs or organizations: Not on file    Relationship status: Not on file  . Intimate partner violence:    Fear of current or ex partner: Not on file    Emotionally abused: Not on file    Physically abused: Not on file    Forced sexual activity: Not on file  Other Topics Concern  . Not on file  Social History Narrative  . Not on file     BP 110/64   Pulse 76   Ht 5\' 3"  (1.6 m)   Wt 169 lb (76.7 kg)   BMI 29.94 kg/m   Physical Exam:  Well appearing NAD HEENT: Unremarkable Neck:  No JVD, no thyromegally Lymphatics:  No adenopathy Back:  No CVA tenderness Lungs:  Clear with no  wheezes HEART:  Regular rate rhythm, no murmurs, no rubs, no clicks Abd:  soft, positive bowel sounds, no organomegally, no rebound, no guarding Ext:  2 plus pulses, no edema, no cyanosis, no clubbing Skin:  No rashes no nodules Neuro:  CN II through XII intact, motor grossly intact  EKG - nsr with LBBB  Assess/Plan: 1. Chronic systolic heart failure - her symptoms are class 3A. She is on maximal medical therapy. I have recommended proceeding with biv PPM insertion. I have reviewed the indications/risks/benefits/goals/expectations of the biv ppm and she wishes to proceed. 2. Breast CA - she is s/p surgery for breast CA and is doing well.   Mikle Bosworth.D.

## 2018-10-20 NOTE — H&P (View-Only) (Signed)
HPI Ms. Calabria is referred today by Dr. Lorre Nick for consideration for a biv device. She is a pleasant 77 yo woman with a h/o chronic systolic heart failure, class 3, on maximal medical therapy. She has LBBB with a QRS duration of 172. She has not had syncope and no peripheral edema.  Allergies  Allergen Reactions  . Lisinopril Cough  . Percodan [Oxycodone-Aspirin] Nausea And Vomiting and Other (See Comments)    Sweating, unable to sleep  . Prednisone Other (See Comments)    Makes pt sweat, unable to sleep  . Amoxicillin-Pot Clavulanate Rash    Has patient had a PCN reaction causing immediate rash, facial/tongue/throat swelling, SOB or lightheadedness with hypotension: Unknown Has patient had a PCN reaction causing severe rash involving mucus membranes or skin necrosis: Unknown Has patient had a PCN reaction that required hospitalization: Unknown Has patient had a PCN reaction occurring within the last 10 years: No If all of the above answers are "NO", then may proceed with Cephalosporin use.      Current Outpatient Medications  Medication Sig Dispense Refill  . amiodarone (PACERONE) 200 MG tablet Take 1 tablet (200 mg total) by mouth 2 (two) times daily. 60 tablet 0  . anastrozole (ARIMIDEX) 1 MG tablet TAKE 1 TABLET BY MOUTH ONCE DAILY (Patient taking differently: Take 1 mg by mouth daily. ) 90 tablet 0  . apixaban (ELIQUIS) 5 MG TABS tablet Take 1 tablet (5 mg total) by mouth 2 (two) times daily. 60 tablet 0  . atorvastatin (LIPITOR) 10 MG tablet Take 10 mg by mouth daily.     Marland Kitchen dextromethorphan (DELSYM) 30 MG/5ML liquid Take 30 mg by mouth 2 (two) times daily as needed for cough.    . furosemide (LASIX) 40 MG tablet Take 1 tablet (40 mg total) by mouth daily. 30 tablet 0  . levothyroxine (SYNTHROID, LEVOTHROID) 88 MCG tablet Take 88 mcg by mouth every morning.     . loratadine (CLARITIN) 10 MG tablet Take 10 mg by mouth daily.    . metoprolol succinate (TOPROL-XL) 25 MG 24  hr tablet Take 1 tablet (25 mg total) by mouth daily. 30 tablet 0  . mometasone-formoterol (DULERA) 100-5 MCG/ACT AERO Inhale 2 puffs into the lungs daily as needed for wheezing.    . sacubitril-valsartan (ENTRESTO) 49-51 MG Take 1 tablet by mouth 2 (two) times daily. 60 tablet 0  . spironolactone (ALDACTONE) 25 MG tablet Take 1 tablet (25 mg total) by mouth daily. 60 tablet 0   No current facility-administered medications for this visit.      Past Medical History:  Diagnosis Date  . Abdominal pain   . Allergy   . Asthma   . Bronchitis   . Cholelithiasis 06-20-2011   [place]   . Diarrhea   . GERD (gastroesophageal reflux disease)   . Hyperlipidemia   . Hypertension   . Hypothyroidism   . Migraines   . Personal history of radiation therapy 2018  . PONV (postoperative nausea and vomiting)   . Thyroid disease    hypothyroid  . Ulcer     ROS:   All systems reviewed and negative except as noted in the HPI.   Past Surgical History:  Procedure Laterality Date  . ABDOMINAL HYSTERECTOMY    . BREAST LUMPECTOMY Left 03/03/2017  . BREAST LUMPECTOMY WITH RADIOACTIVE SEED AND SENTINEL LYMPH NODE BIOPSY Left 03/03/2017   Procedure: LEFT BREAST LUMPECTOMY WITH RADIOACTIVE SEED X2 AND SENTINEL LYMPH NODE BIOPSY;  Surgeon: Fanny Skates, MD;  Location: [facility];  Service: General;  Laterality: Left;  . CHOLECYSTECTOMY  12/25/2011   Procedure: LAPAROSCOPIC CHOLECYSTECTOMY WITH INTRAOPERATIVE CHOLANGIOGRAM;  Surgeon: Earnstine Regal, MD;  Location: [facility];  Service: General;  Laterality: N/A;  laparoscopic cholecystectomy with intraoperative cholangiogram  . COLONOSCOPY  01/10/2003   internal hemorrhoids (Dr. Lajoyce Corners)  . FOOT SURGERY     right  . hysterectomy    . RIGHT/LEFT HEART CATH AND CORONARY ANGIOGRAPHY N/A 10/12/2018   Procedure: RIGHT/LEFT HEART CATH AND CORONARY ANGIOGRAPHY;  Surgeon: Nigel Mormon, MD;  Location: [facility];  Service: Cardiovascular;   Laterality: N/A;  . ruptured disk     in neck  . TONSILLECTOMY    . UPPER GASTROINTESTINAL ENDOSCOPY  01/10/2003   hiatal hernia (Dr. Lajoyce Corners)     Family History  Problem Relation Age of Onset  . Asthma Father   . Colon cancer Neg Hx   . Stomach cancer Neg Hx   . Esophageal cancer Neg Hx   . Rectal cancer Neg Hx      Social History   Socioeconomic History  . Marital status: Married    Spouse name: Not on file  . Number of children: 2  . Years of education: Not on file  . Highest education level: Not on file  Occupational History  . Occupation: Housewife  Social Needs  . Financial resource strain: Not on file  . Food insecurity:    Worry: Not on file    Inability: Not on file  . Transportation needs:    Medical: Not on file    Non-medical: Not on file  Tobacco Use  . Smoking status: Never Smoker  . Smokeless tobacco: Never Used  Substance and Sexual Activity  . Alcohol use: Yes    Comment: rare  . Drug use: No  . Sexual activity: Never  Lifestyle  . Physical activity:    Days per week: Not on file    Minutes per session: Not on file  . Stress: Not on file  Relationships  . Social connections:    Talks on phone: Not on file    Gets together: Not on file    Attends religious service: Not on file    Active member of club or organization: Not on file    Attends meetings of clubs or organizations: Not on file    Relationship status: Not on file  . Intimate partner violence:    Fear of current or ex partner: Not on file    Emotionally abused: Not on file    Physically abused: Not on file    Forced sexual activity: Not on file  Other Topics Concern  . Not on file  Social History Narrative  . Not on file     BP 110/64   Pulse 76   Ht 5\' 3"  (1.6 m)   Wt 169 lb (76.7 kg)   BMI 29.94 kg/m   Physical Exam:  Well appearing NAD HEENT: Unremarkable Neck:  No JVD, no thyromegally Lymphatics:  No adenopathy Back:  No CVA tenderness Lungs:  Clear with no  wheezes HEART:  Regular rate rhythm, no murmurs, no rubs, no clicks Abd:  soft, positive bowel sounds, no organomegally, no rebound, no guarding Ext:  2 plus pulses, no edema, no cyanosis, no clubbing Skin:  No rashes no nodules Neuro:  CN II through XII intact, motor grossly intact  EKG - nsr with LBBB  Assess/Plan: 1. Chronic systolic heart failure - her symptoms are class 3A. She is on maximal medical therapy. I have recommended proceeding with biv PPM insertion. I have reviewed the indications/risks/benefits/goals/expectations of the biv ppm and she wishes to proceed. 2. Breast CA - she is s/p surgery for breast CA and is doing well.   Mikle Bosworth.D.

## 2018-10-20 NOTE — Patient Instructions (Addendum)
Medication Instructions:  Your physician recommends that you continue on your current medications as directed. Please refer to the Current Medication list given to you today. Labwork: None ordered.  Testing/Procedures: Your physician has recommended that you have a pacemaker inserted. A pacemaker is a small device that is placed under the skin of your chest or abdomen to help control abnormal heart rhythms. This device uses electrical pulses to prompt the heart to beat at a normal rate. Pacemakers are used to treat heart rhythms that are too slow. Wire (leads) are attached to the pacemaker that goes into the chambers of you heart. This is done in the hospital and usually requires and overnight stay. Please see the instruction sheet given to you today for more information.  Follow-Up: You will follow up with the device clinic 10-14 days after your procedure for a wound check. You will follow up with Dr. Lovena Le 91 days after your procedure.  INSTRUCTIONS FOR YOUR PACEMAKER IMPLANT:    Please arrive to ADMITTING down the hall from the [facility] main entrance of [facility] at:  5:30 am on October 29, 2018  Use the CHG surgical scrub the night before and morning of your procedure.  Follow the instruction sheet.  Do not eat or drink after midnight prior to procedure  Hold your ELIQUIS for 2 days prior to your procedure.  Your last dose will be on October 26, 2018 your evening dose. On the morning of your procedure take your normal AM medications with a sip of water except for:  LASIX and SPIRONOLACTONE.  Plan for one night stay  You will need someone to drive you home at discharge  If you need a refill on your cardiac medications before your next appointment, please call your pharmacy.    Biventricular Pacemaker Implantation A biventricular pacemaker implantation is a procedure to place (implant) a pacemaker into both of the lower chambers (ventricles) of the heart. A pacemaker  is a small, battery-powered device that helps control the heartbeat. If the heart beats irregularly or too slowly (bradycardia), the pacemaker will pace the heart so that it beats at a normal rate or a programmed rate. The parts of a biventricular pacemaker include:  The pulse generator. The pulse generator contains a small computer and a memory system that is programmed to keep the heart beating at a certain rate. The pulse generator also produces the electrical signal that triggers the heart to beat. This is implanted under the skin of the upper chest, near the collarbone.  Wires (leads). The leads are placed in the left and right ventricles of the heart. The leads are connected to the pulse generator. They transmit electrical pulses from the pulse generator to the heart.  This procedure may be done to treat:  Bradycardia.  Symptoms of severe heart failure, such as shortness of breath (dyspnea).  Loss of consciousness that happens repeatedly (syncope) because of an irregular heart rate.  Tell a health care provider about:  Any allergies you have.  All medicines you are taking, including vitamins, herbs, eye drops, creams, and over-the-counter medicines.  Any problems you or family members have had with anesthetic medicines.  Any blood disorders you have.  Any surgeries you have had.  Any medical conditions you have.  Whether you are pregnant or may be pregnant. What are the risks? Generally, this is a safe procedure. However, problems may occur, including:  Infection.  Bleeding.  Allergic reactions to medicines or dyes.  Damage to  other structures or organs, such as your blood vessels, lungs, or heart.  Failure of the pacemaker to improve your condition.  What happens before the procedure?  Ask your health care provider about: ? Changing or stopping your regular medicines. This is especially important if you are taking diabetes medicines or blood thinners. ? Taking  medicines such as aspirin and ibuprofen. These medicines can thin your blood. Do not take these medicines before your procedure if your health care provider instructs you not to.  Follow instructions from your health care provider about eating or drinking restrictions.  Do not use any tobacco products for at least 24 hours before your procedure. This includes cigarettes, chewing tobacco, or e-cigarettes.  Ask your health care provider how your surgical site will be marked or identified.  You may be given antibiotic medicine to help prevent infection.  You may have tests, including: ? Blood tests. ? Chest X-rays.  Plan to have someone take you home after the procedure.  If you go home right after the procedure, plan to have someone with you for 24 hours. What happens during the procedure?  To reduce your risk of infection: ? Your health care team will wash or sanitize their hands. ? Your skin will be washed with soap. ? Hair may be removed from your surgical area.  An IV tube will be inserted into one of your veins.  You will be given one or more of the following: ? A medicine to help you relax (sedative). ? A medicine to make you fall asleep (general anesthetic). ? A medicine that is injected into your spine to numb the area below and slightly above the injection site (spinal anesthetic). ? A medicine that is injected into an area of your body to numb everything below the injection site (regional anesthetic).  An incision will be made in your upper chest, near your heart.  The leads will be guided into your incision, through your blood vessels, and into your ventricles. Your surgeon will use an X-ray machine (fluoroscope) to guide the leads into your heart.  The leads will be attached to your heart muscles and to the pulse generator.  The leads will be tested to make sure that they work correctly.  The pulse generator will be implanted under your skin, near your  incision.  Your incision will be closed with stitches (sutures), skin glue, or adhesive tape.  A bandage (dressing) will be placed over your incision. The procedure may vary among health care providers and hospitals. What happens after the procedure?  Your blood pressure, heart rate, breathing rate, and blood oxygen level will be monitored often until the medicines you were given have worn off.  You may continue to receive fluids and medicines through an IV tube.  You will have some pain. Pain medicines will be available to help you.  You will have a chest X-ray done. This is to make sure that your pacemaker is in the right place.  You may have to wear compression stockings. These stockings help to prevent blood clots and reduce swelling in your legs.  You will be given a pacemaker identification card. This card lists the implant date, device model, and manufacturer of your pacemaker.  Do not drive for 24 hours if you received a sedative. This information is not intended to replace advice given to you by your health care provider. Make sure you discuss any questions you have with your health care provider. Document Released: 07/28/2012 Document Revised:  04/10/2016 Document Reviewed: 07/29/2015 Elsevier Interactive Patient Education  [employer].

## 2018-10-26 DIAGNOSIS — I10 Essential (primary) hypertension: Secondary | ICD-10-CM | POA: Diagnosis not present

## 2018-10-29 ENCOUNTER — Ambulatory Visit (HOSPITAL_COMMUNITY)
Admission: RE | Admit: 2018-10-29 | Discharge: 2018-10-30 | Disposition: A | Discharge status: Home / Self Care | Payer: Medicare Other | Attending: Internal Medicine | Admitting: Internal Medicine

## 2018-10-29 ENCOUNTER — Encounter (HOSPITAL_COMMUNITY): Payer: Self-pay | Admitting: Internal Medicine

## 2018-10-29 ENCOUNTER — Encounter (HOSPITAL_COMMUNITY)
Admission: RE | Disposition: A | Discharge status: Home / Self Care | Payer: Self-pay | Source: Home / Self Care | Attending: Internal Medicine

## 2018-10-29 ENCOUNTER — Other Ambulatory Visit: Payer: Self-pay

## 2018-10-29 DIAGNOSIS — C50412 Malignant neoplasm of upper-outer quadrant of left female breast: Secondary | ICD-10-CM

## 2018-10-29 DIAGNOSIS — I4430 Unspecified atrioventricular block: Secondary | ICD-10-CM | POA: Diagnosis not present

## 2018-10-29 DIAGNOSIS — Z880 Allergy status to penicillin: Secondary | ICD-10-CM | POA: Diagnosis not present

## 2018-10-29 DIAGNOSIS — Z170 Estrogen receptor positive status [ER+]: Secondary | ICD-10-CM | POA: Diagnosis not present

## 2018-10-29 DIAGNOSIS — Z955 Presence of coronary angioplasty implant and graft: Secondary | ICD-10-CM | POA: Diagnosis not present

## 2018-10-29 DIAGNOSIS — Z7901 Long term (current) use of anticoagulants: Secondary | ICD-10-CM | POA: Insufficient documentation

## 2018-10-29 DIAGNOSIS — I10 Essential (primary) hypertension: Secondary | ICD-10-CM | POA: Diagnosis present

## 2018-10-29 DIAGNOSIS — I428 Other cardiomyopathies: Secondary | ICD-10-CM | POA: Diagnosis not present

## 2018-10-29 DIAGNOSIS — E785 Hyperlipidemia, unspecified: Secondary | ICD-10-CM | POA: Diagnosis not present

## 2018-10-29 DIAGNOSIS — Z90710 Acquired absence of both cervix and uterus: Secondary | ICD-10-CM | POA: Diagnosis not present

## 2018-10-29 DIAGNOSIS — K219 Gastro-esophageal reflux disease without esophagitis: Secondary | ICD-10-CM | POA: Diagnosis not present

## 2018-10-29 DIAGNOSIS — Z79890 Hormone replacement therapy (postmenopausal): Secondary | ICD-10-CM | POA: Diagnosis not present

## 2018-10-29 DIAGNOSIS — I110 Hypertensive heart disease with heart failure: Secondary | ICD-10-CM | POA: Diagnosis not present

## 2018-10-29 DIAGNOSIS — I447 Left bundle-branch block, unspecified: Secondary | ICD-10-CM

## 2018-10-29 DIAGNOSIS — Z885 Allergy status to narcotic agent status: Secondary | ICD-10-CM | POA: Insufficient documentation

## 2018-10-29 DIAGNOSIS — Z888 Allergy status to other drugs, medicaments and biological substances status: Secondary | ICD-10-CM | POA: Diagnosis not present

## 2018-10-29 DIAGNOSIS — I5022 Chronic systolic (congestive) heart failure: Secondary | ICD-10-CM

## 2018-10-29 DIAGNOSIS — I480 Paroxysmal atrial fibrillation: Secondary | ICD-10-CM | POA: Diagnosis not present

## 2018-10-29 DIAGNOSIS — E039 Hypothyroidism, unspecified: Secondary | ICD-10-CM | POA: Diagnosis not present

## 2018-10-29 DIAGNOSIS — Z79899 Other long term (current) drug therapy: Secondary | ICD-10-CM | POA: Insufficient documentation

## 2018-10-29 DIAGNOSIS — Z886 Allergy status to analgesic agent status: Secondary | ICD-10-CM | POA: Diagnosis not present

## 2018-10-29 DIAGNOSIS — Z950 Presence of cardiac pacemaker: Secondary | ICD-10-CM | POA: Insufficient documentation

## 2018-10-29 HISTORY — DX: Chronic systolic (congestive) heart failure: I50.22

## 2018-10-29 HISTORY — DX: Presence of cardiac pacemaker: Z95.0

## 2018-10-29 HISTORY — DX: Personal history of other diseases of the digestive system: Z87.19

## 2018-10-29 HISTORY — DX: Other seasonal allergic rhinitis: J30.2

## 2018-10-29 HISTORY — DX: Malignant neoplasm of unspecified site of left female breast: C50.912

## 2018-10-29 HISTORY — DX: Personal history of peptic ulcer disease: Z87.11

## 2018-10-29 HISTORY — PX: BIV PACEMAKER INSERTION CRT-P: EP1199

## 2018-10-29 LAB — SURGICAL PCR SCREEN
MRSA, PCR: NEGATIVE
Staphylococcus aureus: NEGATIVE

## 2018-10-29 LAB — CBC
HEMATOCRIT: 55.2 % — AB (ref 36.0–46.0)
HEMOGLOBIN: 17.5 g/dL — AB (ref 12.0–15.0)
MCH: 29.3 pg (ref 26.0–34.0)
MCHC: 31.7 g/dL (ref 30.0–36.0)
MCV: 92.3 fL (ref 80.0–100.0)
NRBC: 0 % (ref 0.0–0.2)
Platelets: 242 10*3/uL (ref 150–400)
RBC: 5.98 MIL/uL — ABNORMAL HIGH (ref 3.87–5.11)
RDW: 13.2 % (ref 11.5–15.5)
WBC: 8 10*3/uL (ref 4.0–10.5)

## 2018-10-29 SURGERY — BIV PACEMAKER INSERTION CRT-P

## 2018-10-29 MED ORDER — IOPAMIDOL (ISOVUE-370) INJECTION 76%
INTRAVENOUS | Status: DC | PRN
  Administered 2018-10-29: 29 mL via INTRAVENOUS
  Administered 2018-10-29: 10 mL via INTRAVENOUS

## 2018-10-29 MED ORDER — FENTANYL CITRATE (PF) 100 MCG/2ML IJ SOLN
INTRAMUSCULAR | Status: DC | PRN
  Administered 2018-10-29 (×3): 12.5 ug via INTRAVENOUS

## 2018-10-29 MED ORDER — VANCOMYCIN HCL IN DEXTROSE 1-5 GM/200ML-% IV SOLN
INTRAVENOUS | Status: AC
  Filled 2018-10-29: qty 200

## 2018-10-29 MED ORDER — METOPROLOL SUCCINATE ER 25 MG PO TB24
25.0000 mg | ORAL_TABLET | Freq: Every day | ORAL | Status: DC
  Administered 2018-10-29 – 2018-10-30 (×2): 25 mg via ORAL
  Filled 2018-10-29 (×2): qty 1

## 2018-10-29 MED ORDER — AMIODARONE HCL 200 MG PO TABS
200.0000 mg | ORAL_TABLET | Freq: Two times a day (BID) | ORAL | Status: DC
  Administered 2018-10-29 – 2018-10-30 (×3): 200 mg via ORAL
  Filled 2018-10-29 (×3): qty 1

## 2018-10-29 MED ORDER — MIDAZOLAM HCL 5 MG/5ML IJ SOLN
INTRAMUSCULAR | Status: DC | PRN
  Administered 2018-10-29 (×4): 1 mg via INTRAVENOUS

## 2018-10-29 MED ORDER — IOPAMIDOL (ISOVUE-370) INJECTION 76%
INTRAVENOUS | Status: AC
  Filled 2018-10-29: qty 50

## 2018-10-29 MED ORDER — MUPIROCIN 2 % EX OINT
1.0000 "application " | TOPICAL_OINTMENT | Freq: Once | CUTANEOUS | Status: AC
  Administered 2018-10-29: 1 via NASAL
  Filled 2018-10-29 (×2): qty 22

## 2018-10-29 MED ORDER — VANCOMYCIN HCL IN DEXTROSE 1-5 GM/200ML-% IV SOLN
1000.0000 mg | INTRAVENOUS | Status: AC
  Administered 2018-10-29: 1000 mg via INTRAVENOUS
  Filled 2018-10-29: qty 200

## 2018-10-29 MED ORDER — CHLORHEXIDINE GLUCONATE 4 % EX LIQD
60.0000 mL | Freq: Once | CUTANEOUS | Status: DC
  Filled 2018-10-29: qty 60

## 2018-10-29 MED ORDER — ATORVASTATIN CALCIUM 10 MG PO TABS
10.0000 mg | ORAL_TABLET | Freq: Every day | ORAL | Status: DC
  Administered 2018-10-29 – 2018-10-30 (×2): 10 mg via ORAL
  Filled 2018-10-29 (×2): qty 1

## 2018-10-29 MED ORDER — VANCOMYCIN HCL IN DEXTROSE 1-5 GM/200ML-% IV SOLN
1000.0000 mg | Freq: Two times a day (BID) | INTRAVENOUS | Status: AC
  Administered 2018-10-29: 1000 mg via INTRAVENOUS
  Filled 2018-10-29: qty 200

## 2018-10-29 MED ORDER — LORATADINE 10 MG PO TABS
10.0000 mg | ORAL_TABLET | Freq: Every day | ORAL | Status: DC
  Administered 2018-10-29 – 2018-10-30 (×2): 10 mg via ORAL
  Filled 2018-10-29 (×2): qty 1

## 2018-10-29 MED ORDER — ACETAMINOPHEN 325 MG PO TABS
325.0000 mg | ORAL_TABLET | ORAL | Status: DC | PRN
  Administered 2018-10-29 – 2018-10-30 (×2): 650 mg via ORAL
  Filled 2018-10-29 (×2): qty 2

## 2018-10-29 MED ORDER — LEVOTHYROXINE SODIUM 88 MCG PO TABS
88.0000 ug | ORAL_TABLET | ORAL | Status: DC
  Administered 2018-10-30: 88 ug via ORAL
  Filled 2018-10-29 (×2): qty 1

## 2018-10-29 MED ORDER — FENTANYL CITRATE (PF) 100 MCG/2ML IJ SOLN
INTRAMUSCULAR | Status: AC
  Filled 2018-10-29: qty 2

## 2018-10-29 MED ORDER — SODIUM CHLORIDE 0.9 % IV SOLN
80.0000 mg | INTRAVENOUS | Status: AC
  Administered 2018-10-29: 80 mg
  Filled 2018-10-29: qty 2

## 2018-10-29 MED ORDER — MIDAZOLAM HCL 5 MG/5ML IJ SOLN
INTRAMUSCULAR | Status: AC
  Filled 2018-10-29: qty 5

## 2018-10-29 MED ORDER — HEPARIN (PORCINE) IN NACL 1000-0.9 UT/500ML-% IV SOLN
INTRAVENOUS | Status: DC | PRN
  Administered 2018-10-29: 500 mL

## 2018-10-29 MED ORDER — SODIUM CHLORIDE 0.9 % IV SOLN
INTRAVENOUS | Status: DC
  Administered 2018-10-29: 06:00:00 via INTRAVENOUS

## 2018-10-29 MED ORDER — FUROSEMIDE 40 MG PO TABS
40.0000 mg | ORAL_TABLET | Freq: Every day | ORAL | Status: DC
  Administered 2018-10-29 – 2018-10-30 (×2): 40 mg via ORAL
  Filled 2018-10-29 (×2): qty 1

## 2018-10-29 MED ORDER — LIDOCAINE HCL (PF) 1 % IJ SOLN
INTRAMUSCULAR | Status: AC
  Filled 2018-10-29: qty 60

## 2018-10-29 MED ORDER — ONDANSETRON HCL 4 MG/2ML IJ SOLN: 4.0000 mg | Freq: Four times a day (QID) | INTRAMUSCULAR | Status: DC | PRN

## 2018-10-29 MED ORDER — LIDOCAINE HCL (PF) 1 % IJ SOLN
INTRAMUSCULAR | Status: DC | PRN
  Administered 2018-10-29: 90 mL

## 2018-10-29 MED ORDER — SACUBITRIL-VALSARTAN 49-51 MG PO TABS
1.0000 | ORAL_TABLET | Freq: Two times a day (BID) | ORAL | Status: DC
  Administered 2018-10-29 – 2018-10-30 (×2): 1 via ORAL
  Filled 2018-10-29 (×2): qty 1

## 2018-10-29 MED ORDER — SODIUM CHLORIDE 0.9 % IV SOLN
INTRAVENOUS | Status: AC
  Filled 2018-10-29: qty 2

## 2018-10-29 MED ORDER — HEPARIN (PORCINE) IN NACL 1000-0.9 UT/500ML-% IV SOLN
INTRAVENOUS | Status: AC
  Filled 2018-10-29: qty 500

## 2018-10-29 MED ORDER — SPIRONOLACTONE 25 MG PO TABS
25.0000 mg | ORAL_TABLET | Freq: Every day | ORAL | Status: DC
  Administered 2018-10-29 – 2018-10-30 (×2): 25 mg via ORAL
  Filled 2018-10-29 (×2): qty 1

## 2018-10-29 SURGICAL SUPPLY — 18 items
BALLN COR SINUS VENO 6F 80CM (BALLOONS) ×1
BALLN COR SINUS VENO 6FR 80 (BALLOONS) ×2
BALLOON COR SINUS VENO 6FR 80 (BALLOONS) ×1 IMPLANT
CABLE SURGICAL S-101-97-12 (CABLE) ×3 IMPLANT
CATH CPS DIRECT 135 DS2C020 (CATHETERS) ×3 IMPLANT
CATH HEX JOSEPH 2-5-2 65CM 6F (CATHETERS) ×3 IMPLANT
CPS IMPLANT KIT 410190 (MISCELLANEOUS) ×3 IMPLANT
LEAD QUARTET 1458Q-86CM (Lead) ×3 IMPLANT
LEAD TENDRIL MRI 46CM LPA1200M (Lead) ×3 IMPLANT
LEAD TENDRIL MRI 52CM LPA1200M (Lead) ×3 IMPLANT
PACEMAKER QUDR ALLR CRT PM3562 (Pacemaker) ×1 IMPLANT
PAD PRO RADIOLUCENT 2001M-C (PAD) ×3 IMPLANT
PMKR QUADRA ALLURE CRT PM3562 (Pacemaker) ×3 IMPLANT
SHEATH CLASSIC 8F (SHEATH) ×6 IMPLANT
SHIELD RADPAD SCOOP 12X17 (MISCELLANEOUS) ×3 IMPLANT
SLITTER UNIVERSAL DS2A003 (MISCELLANEOUS) ×3 IMPLANT
TRAY PACEMAKER INSERTION (PACKS) ×3 IMPLANT
WIRE SION BLUE 180 (WIRE) ×3 IMPLANT

## 2018-10-29 NOTE — Progress Notes (Signed)
Orthopedic Tech Progress Note Patient Details:  Erin Novak 1941/07/31 [id number]  Patient ID: Erin Novak, female   DOB: 12-15-1940, 77 y.o.   MRN: [id number] Pt already has arm sling.  Karolee Stamps 10/29/2018, 2:38 PM

## 2018-10-29 NOTE — Discharge Instructions (Signed)
° ° °  Supplemental Discharge Instructions for  Pacemaker/Defibrillator Patients  Activity No heavy lifting or vigorous activity with your left/right arm for 6 to 8 weeks.  Do not raise your left/right arm above your head for one week.  Gradually raise your affected arm as drawn below.              11/02/18                  11/03/18                   11/04/18               11/05/18 __  NO DRIVING for  1 week   ; you may begin driving on  26/71/24   .  WOUND CARE - Keep the wound area clean and dry.  Do not get this area wet for one week. No showers for one week; you may shower on 11/05/18   . - The tape/steri-strips on your wound will fall off; do not pull them off.  No bandage is needed on the site.  DO  NOT apply any creams, oils, or ointments to the wound area. - If you notice any drainage or discharge from the wound, any swelling or bruising at the site, or you develop a fever > 101? F after you are discharged home, call the office at once.  Special Instructions - You are still able to use cellular telephones; use the ear opposite the side where you have your pacemaker/defibrillator.  Avoid carrying your cellular phone near your device. - When traveling through airports, show security personnel your identification card to avoid being screened in the metal detectors.  Ask the security personnel to use the hand wand. - Avoid arc welding equipment, MRI testing (magnetic resonance imaging), TENS units (transcutaneous nerve stimulators).  Call the office for questions about other devices. - Avoid electrical appliances that are in poor condition or are not properly grounded. - Microwave ovens are safe to be near or to operate.

## 2018-10-29 NOTE — Interval H&P Note (Signed)
History and Physical Interval Note:  10/29/2018 7:29 AM  Erin Novak  has presented today for surgery, with the diagnosis of cardiomyopathy  The various methods of treatment have been discussed with the patient and family. After consideration of risks, benefits and other options for treatment, the patient has consented to  Procedure(s): BIV PACEMAKER INSERTION CRT-P (N/A) as a surgical intervention .  The patient's history has been reviewed, patient examined, no change in status, stable for surgery.  I have reviewed the patient's chart and labs.  Questions were answered to the patient's satisfaction.     Cristopher Peru

## 2018-10-30 ENCOUNTER — Ambulatory Visit (HOSPITAL_COMMUNITY): Payer: Medicare Other

## 2018-10-30 DIAGNOSIS — Z950 Presence of cardiac pacemaker: Secondary | ICD-10-CM

## 2018-10-30 DIAGNOSIS — Z170 Estrogen receptor positive status [ER+]: Secondary | ICD-10-CM | POA: Diagnosis not present

## 2018-10-30 DIAGNOSIS — I5022 Chronic systolic (congestive) heart failure: Secondary | ICD-10-CM | POA: Diagnosis not present

## 2018-10-30 DIAGNOSIS — I4430 Unspecified atrioventricular block: Secondary | ICD-10-CM | POA: Diagnosis not present

## 2018-10-30 DIAGNOSIS — C50412 Malignant neoplasm of upper-outer quadrant of left female breast: Secondary | ICD-10-CM | POA: Diagnosis not present

## 2018-10-30 DIAGNOSIS — E039 Hypothyroidism, unspecified: Secondary | ICD-10-CM | POA: Diagnosis not present

## 2018-10-30 DIAGNOSIS — E785 Hyperlipidemia, unspecified: Secondary | ICD-10-CM | POA: Diagnosis not present

## 2018-10-30 DIAGNOSIS — I428 Other cardiomyopathies: Secondary | ICD-10-CM | POA: Diagnosis not present

## 2018-10-30 DIAGNOSIS — I480 Paroxysmal atrial fibrillation: Secondary | ICD-10-CM | POA: Diagnosis not present

## 2018-10-30 DIAGNOSIS — I447 Left bundle-branch block, unspecified: Secondary | ICD-10-CM | POA: Diagnosis not present

## 2018-10-30 DIAGNOSIS — K219 Gastro-esophageal reflux disease without esophagitis: Secondary | ICD-10-CM | POA: Diagnosis not present

## 2018-10-30 DIAGNOSIS — Z7901 Long term (current) use of anticoagulants: Secondary | ICD-10-CM | POA: Diagnosis not present

## 2018-10-30 DIAGNOSIS — Z4682 Encounter for fitting and adjustment of non-vascular catheter: Secondary | ICD-10-CM | POA: Diagnosis not present

## 2018-10-30 DIAGNOSIS — I110 Hypertensive heart disease with heart failure: Secondary | ICD-10-CM | POA: Diagnosis not present

## 2018-10-30 HISTORY — DX: Presence of cardiac pacemaker: Z95.0

## 2018-10-30 NOTE — Discharge Summary (Addendum)
Discharge Summary    Patient ID: Erin Novak MRN: [id number]; DOB: 1941/08/19  Admit date: 10/29/2018 Discharge date: 10/30/2018  Primary Care Provider: [name], MD  Primary Cardiologist: Dr. Rhodia Albright Primary Electrophysiologist: Dr. 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 Lisinopril Cough  . Percodan [Oxycodone-Aspirin] Nausea And Vomiting and Other (See Comments)    Sweating, unable to sleep  . Prednisone Other (See Comments)    Makes pt sweat, unable to sleep  . Amoxicillin-Pot Clavulanate Rash    Has patient had a PCN reaction causing immediate rash, facial/tongue/throat swelling, SOB or lightheadedness with hypotension: Unknown Has patient had a PCN reaction causing severe rash involving mucus membranes or skin necrosis: Unknown Has patient had a PCN reaction that required hospitalization: Unknown Has patient had a PCN reaction occurring within the last 10 years: No If all of the above answers are "NO", then may proceed with Cephalosporin use.     Diagnostic Studies/Procedures    10/29/18 BIV PACEMAKER INSERTION CRT-P  Conclusion  CONCLUSIONS:  1. Successful implantation of a St. 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 No early apparent complications.   Cristopher Peru, MD  9:30 AM 10/29/2018   The leads were then connected to the [facility]. Jude (serial number [id number]) pacemaker. _____________   TTE 11/19 - Left ventricle: Wall thickness was  increased in a pattern of moderate LVH. Systolic function was severely reduced. The estimated ejection fraction was in the range of 20% to 25%. Diffuse hypokinesis. The study is not technically sufficient to allow evaluation of LV diastolic function. - Ventricular septum: Septal motion showed abnormal function and dyssynergy. These changes are consistent with a left bundle branch block. - Mitral valve: Premature closure of the MV leaflets suggests elevated LVEDP. There was severe regurgitation directed centrally and posteriorly. - Left atrium: The atrium was moderately dilated. - Right ventricle: The cavity size was mildly dilated. - Right atrium: The atrium was mildly dilated. - Atrial septum: No defect or patent foramen ovale was identified. - Tricuspid valve: There was moderate-severe regurgitation. - Pulmonary arteries: PA peak pressure: 47 mm Hg (S).  History of Present Illness     Erin Novak is a 77 y.o. female with a history of breast cancer, PAF on Eliquis, NHYA class III chronic systolic heart failure on maximal medical therapy and LBBB with a QRS duration of 172 ms who presented to [facility] on 10/29/18 for planned CRT -P placement.    Hospital Course     Consultants: none  Chronic systolic HF: s/p CRT implant, St. Jude. CXR and interrogation without issue. Plan for discharge today with follow up in device clinic. She has a wound check scheduled for 12/23 _____________  Discharge Vitals Blood pressure 115/70, pulse 64, temperature (!) 97.5 F (36.4 C), temperature source Oral, resp. rate 19, height 5\' 2"  (1.575 m), weight 73.2 kg, SpO2 95 %.  Filed Weights   10/29/18 0542 10/30/18 0344  Weight: 72.6 kg 73.2 kg    Labs & Radiologic Studies  CBC Recent Labs    10/29/18 0606  WBC 8.0  HGB 17.5*  HCT 55.2*  MCV 92.3  PLT 657   Basic Metabolic Panel No results for input(s): NA, K, CL, CO2, GLUCOSE, BUN, CREATININE, CALCIUM, MG, PHOS in the last  72 hours. Liver Function Tests No results for input(s): AST, ALT, ALKPHOS, BILITOT, PROT, ALBUMIN in the last 72 hours. No results for input(s): LIPASE, AMYLASE in the last 72 hours. Cardiac Enzymes No results for input(s): CKTOTAL, CKMB, CKMBINDEX, TROPONINI in the last 72 hours. BNP Invalid input(s): POCBNP D-Dimer No results for input(s): DDIMER in the last 72 hours. Hemoglobin A1C No results for input(s): HGBA1C in the last 72 hours. Fasting Lipid Panel No results for input(s): CHOL, HDL, LDLCALC, TRIG, CHOLHDL, LDLDIRECT in the last 72 hours. Thyroid Function Tests No results for input(s): TSH, T4TOTAL, T3FREE, THYROIDAB in the last 72 hours.  Invalid input(s): FREET3 _____________  Dg Chest 2 View  Result Date: 10/30/2018 CLINICAL DATA:  Status post pacemaker placement. EXAM: CHEST - 2 VIEW COMPARISON:  Radiographs of October 08, 2018. FINDINGS: The heart size and mediastinal contours are within normal limits. Both lungs are clear. Interval placement of left-sided pacemaker with leads in grossly good position. No pneumothorax or pleural effusion is noted. The visualized skeletal structures are unremarkable. IMPRESSION: Interval placement of left-sided pacemaker with leads in grossly good position. No pneumothorax is noted. Electronically Signed   By: Marijo Conception, M.D.   On: 10/30/2018 08:09   Dg Chest 2 View  Result Date: 10/08/2018 CLINICAL DATA:  Diagnosis from the emergency department with new diagnosis of atrial fibrillation today. Increasing shortness of breath and fatigue at home. EXAM: CHEST - 2 VIEW COMPARISON:  10/08/2018 and 10/01/2018 radiographs. FINDINGS: 1928 hours. Stable cardiomegaly, tiny bilateral pleural effusions and mild interstitial prominence. There is no invert pulmonary edema, confluent airspace opacity or pneumothorax. Telemetry leads overlie the chest. No acute osseous findings. IMPRESSION: Stable chest with cardiomegaly and trace bilateral pleural  effusions. Electronically Signed   By: Richardean Sale M.D.   On: 10/08/2018 19:47   Dg Chest 2 View  Result Date: 10/08/2018 CLINICAL DATA:  Fatigue and shortness of breath. EXAM: CHEST - 2 VIEW COMPARISON:  10/01/2018 and prior radiographs FINDINGS: Cardiomegaly again noted. Trace bilateral pleural effusions are again noted. There is no evidence of focal airspace disease, pulmonary edema, suspicious pulmonary nodule/mass or pneumothorax. No acute bony abnormalities are identified. IMPRESSION: Cardiomegaly with trace bilateral pleural effusions. No significant change. Electronically Signed   By: Margarette Canada M.D.   On: 10/08/2018 10:31   Dg Chest 2 View  Result Date: 10/01/2018 CLINICAL DATA:  Shortness of breath, cough, and fatigue since being discharged 3 days ago for hypokalemia. History of atrial fibrillation, asthma. EXAM: CHEST - 2 VIEW COMPARISON:  Chest x-ray of September 24, 2018 FINDINGS: The lungs are adequately inflated. There is no focal infiltrate. There is a tiny amount of pleural fluid bilaterally. The cardiac silhouette is enlarged. The pulmonary vascularity is mildly engorged. The bony thorax exhibits no acute abnormality. IMPRESSION: CHF with trace bilateral pleural effusions and very mild interstitial edema. No alveolar pneumonia. Electronically Signed   By: David  Martinique M.D.   On: 10/01/2018 11:42   Disposition   Pt is being discharged home today in good condition.  Follow-up Plans & Appointments    Follow-up Information    [facility] up.   Specialty:  Cardiology Why:  11/08/18 @ 3:00PM, wound  check visit Contact information: [address] [place]       Evans Lance, MD Follow up.   Specialty:  Cardiology Why:  02/02/19 @ 10:15AM Contact information: [address] [place] [address] [phone]            Discharge Medications   Allergies as of  10/30/2018      Reactions   Lisinopril Cough   Percodan [oxycodone-aspirin] Nausea And Vomiting, Other (See Comments)   Sweating, unable to sleep   Prednisone Other (See Comments)   Makes pt sweat, unable to sleep   Amoxicillin-pot Clavulanate Rash   Has patient had a PCN reaction causing immediate rash, facial/tongue/throat swelling, SOB or lightheadedness with hypotension: Unknown Has patient had a PCN reaction causing severe rash involving mucus membranes or skin necrosis: Unknown Has patient had a PCN reaction that required hospitalization: Unknown Has patient had a PCN reaction occurring within the last 10 years: No If all of the above answers are "NO", then may proceed with Cephalosporin use.      Medication List    TAKE these medications   amiodarone 200 MG tablet Commonly known as:  PACERONE Take 1 tablet (200 mg total) by mouth 2 (two) times daily.   anastrozole 1 MG tablet Commonly known as:  ARIMIDEX TAKE 1 TABLET BY MOUTH ONCE DAILY   apixaban 5 MG Tabs tablet Commonly known as:  ELIQUIS Take 1 tablet (5 mg total) by mouth 2 (two) times daily.   atorvastatin 10 MG tablet Commonly known as:  LIPITOR Take 10 mg by mouth daily.   DULERA 200-5 MCG/ACT Aero Generic drug:  mometasone-formoterol Inhale 2 puffs into the lungs 2 (two) times daily as needed for wheezing or shortness of breath.   furosemide 40 MG tablet Commonly known as:  LASIX Take 1 tablet (40 mg total) by mouth daily.   levothyroxine 88 MCG tablet Commonly known as:  SYNTHROID, LEVOTHROID Take 88 mcg by mouth every morning.   loratadine 10 MG tablet Commonly known as:  CLARITIN Take 10 mg by mouth daily.   metoprolol succinate 25 MG 24 hr tablet Commonly known as:  TOPROL-XL Take 1 tablet (25 mg total) by mouth daily.   sacubitril-valsartan 49-51 MG Commonly known as:  ENTRESTO Take 1 tablet by mouth 2 (two) times daily.   spironolactone 25 MG tablet Commonly known as:  ALDACTONE Take  1 tablet (25 mg total) by mouth daily.        Acute coronary syndrome (MI, NSTEMI, STEMI, etc) this admission?: No.    Outstanding Labs/Studies   none  Duration of Discharge Encounter   Greater than 30 minutes including physician time.  Signed, Angelena Form, PA-C 10/30/2018, 11:55 AM    I have seen and examined this patient with Nell Range.  Agree with above, note added to reflect my findings.  On exam, RRR, no murmurs, lungs clear. CRT-D implanted. CXR and interrogation without issues. Plan for discharge with device clinic follow up.    Anthonny Schiller M. Ashleyann Shoun MD 10/30/2018 12:13 PM

## 2018-10-30 NOTE — Progress Notes (Signed)
Progress Note  Patient Name: Erin Novak Date of Encounter: 10/30/2018  Primary Cardiologist: Lovena Le  Subjective   Feeling well without complaint  Inpatient Medications    Scheduled Meds: . amiodarone  200 mg Oral BID  . atorvastatin  10 mg Oral Daily  . furosemide  40 mg Oral Daily  . levothyroxine  88 mcg Oral BH-q7a  . loratadine  10 mg Oral Daily  . metoprolol succinate  25 mg Oral Daily  . sacubitril-valsartan  1 tablet Oral BID  . spironolactone  25 mg Oral Daily   Continuous Infusions:  PRN Meds: acetaminophen, ondansetron (ZOFRAN) IV   Vital Signs    Vitals:   10/29/18 1611 10/29/18 1959 10/30/18 0344 10/30/18 0949  BP: 115/66 117/63 123/80 (!) 151/127  Pulse: 69 62 70 68  Resp: 20 16 19    Temp: 97.9 F (36.6 C) 98.4 F (36.9 C) (!) 97.5 F (36.4 C)   TempSrc: Oral Oral Oral   SpO2: 96% 93% 95%   Weight:   73.2 kg   Height:        Intake/Output Summary (Last 24 hours) at 10/30/2018 0955 Last data filed at 10/30/2018 1740 Gross per 24 hour  Intake 1043.67 ml  Output 1200 ml  Net -156.33 ml   Filed Weights   10/29/18 0542 10/30/18 0344  Weight: 72.6 kg 73.2 kg    Telemetry    A sense, V paced - Personally Reviewed  ECG    A sense, V paced - Personally Reviewed  Physical Exam   GEN: Well nourished, well developed, in no acute distress  HEENT: normal  Neck: no JVD, carotid bruits, or masses Cardiac: RRR; no murmurs, rubs, or gallops,no edema  Respiratory:  clear to auscultation bilaterally, normal work of breathing GI: soft, nontender, nondistended, + BS MS: no deformity or atrophy  Skin: warm and dry, device site well healed Neuro:  Strength and sensation are intact Psych: euthymic mood, full affect  Labs    ChemistryNo results for input(s): NA, K, CL, CO2, GLUCOSE, BUN, CREATININE, CALCIUM, PROT, ALBUMIN, AST, ALT, ALKPHOS, BILITOT, GFRNONAA, GFRAA, ANIONGAP in the last 168 hours.   Hematology Recent Labs  Lab  10/29/18 0606  WBC 8.0  RBC 5.98*  HGB 17.5*  HCT 55.2*  MCV 92.3  MCH 29.3  MCHC 31.7  RDW 13.2  PLT 242    Cardiac EnzymesNo results for input(s): TROPONINI in the last 168 hours. No results for input(s): TROPIPOC in the last 168 hours.   BNPNo results for input(s): BNP, PROBNP in the last 168 hours.   DDimer No results for input(s): DDIMER in the last 168 hours.   Radiology    Dg Chest 2 View  Result Date: 10/30/2018 CLINICAL DATA:  Status post pacemaker placement. EXAM: CHEST - 2 VIEW COMPARISON:  Radiographs of October 08, 2018. FINDINGS: The heart size and mediastinal contours are within normal limits. Both lungs are clear. Interval placement of left-sided pacemaker with leads in grossly good position. No pneumothorax or pleural effusion is noted. The visualized skeletal structures are unremarkable. IMPRESSION: Interval placement of left-sided pacemaker with leads in grossly good position. No pneumothorax is noted. Electronically Signed   By: Marijo Conception, M.D.   On: 10/30/2018 08:09    Cardiac Studies   TTE 11/19 - Left ventricle: Wall thickness was increased in a pattern of   moderate LVH. Systolic function was severely reduced. The   estimated ejection fraction was in the range of 20% to  25%.   Diffuse hypokinesis. The study is not technically sufficient to   allow evaluation of LV diastolic function. - Ventricular septum: Septal motion showed abnormal function and   dyssynergy. These changes are consistent with a left bundle   branch block. - Mitral valve: Premature closure of the MV leaflets suggests   elevated LVEDP. There was severe regurgitation directed centrally   and posteriorly. - Left atrium: The atrium was moderately dilated. - Right ventricle: The cavity size was mildly dilated. - Right atrium: The atrium was mildly dilated. - Atrial septum: No defect or patent foramen ovale was identified. - Tricuspid valve: There was moderate-severe  regurgitation. - Pulmonary arteries: PA peak pressure: 47 mm Hg (S).  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 Chronic systolic HF: s/p CRT implant, St. Jude. CXR and interrogation without issue. Plan for discharge today with follow up in device clinic.  For questions or updates, please contact [facility] Please consult [email or web address] for contact info under        Signed, Thos Matsumoto Meredith Leeds, MD  10/30/2018, 9:55 AM

## 2018-10-30 NOTE — Plan of Care (Signed)
  Problem: Pain Managment: Goal: General experience of comfort will improve Outcome: Progressing Note:  Surgical site pain relieved with tylenol.   Problem: Cardiac: Goal: Ability to achieve and maintain adequate cardiopulmonary perfusion will improve Outcome: Progressing   Problem: Education: Goal: Knowledge of General Education information will improve Description Including pain rating scale, medication(s)/side effects and non-pharmacologic comfort measures Outcome: Completed/Met

## 2018-11-08 ENCOUNTER — Ambulatory Visit (INDEPENDENT_AMBULATORY_CARE_PROVIDER_SITE_OTHER): Payer: Medicare Other | Admitting: *Deleted

## 2018-11-08 DIAGNOSIS — I480 Paroxysmal atrial fibrillation: Secondary | ICD-10-CM | POA: Diagnosis not present

## 2018-11-08 DIAGNOSIS — I5022 Chronic systolic (congestive) heart failure: Secondary | ICD-10-CM | POA: Diagnosis not present

## 2018-11-08 DIAGNOSIS — I447 Left bundle-branch block, unspecified: Secondary | ICD-10-CM

## 2018-11-08 LAB — CUP PACEART INCLINIC DEVICE CHECK
Battery Remaining Longevity: 73 mo
Battery Voltage: 3.02 V
Brady Statistic RA Percent Paced: 39 %
Brady Statistic RV Percent Paced: 98 %
Date Time Interrogation Session: 20191223165711
Implantable Lead Implant Date: 20191213
Implantable Lead Implant Date: 20191213
Implantable Lead Location: 753858
Implantable Lead Location: 753859
Implantable Lead Location: 753860
Implantable Pulse Generator Implant Date: 20191213
Lead Channel Impedance Value: 537.5 Ohm
Lead Channel Impedance Value: 637.5 Ohm
Lead Channel Impedance Value: 950 Ohm
Lead Channel Pacing Threshold Amplitude: 0.75 V
Lead Channel Pacing Threshold Amplitude: 1 V
Lead Channel Pacing Threshold Amplitude: 1 V
Lead Channel Pacing Threshold Amplitude: 1.75 V
Lead Channel Pacing Threshold Pulse Width: 0.5 ms
Lead Channel Pacing Threshold Pulse Width: 0.5 ms
Lead Channel Pacing Threshold Pulse Width: 0.5 ms
Lead Channel Pacing Threshold Pulse Width: 0.5 ms
Lead Channel Sensing Intrinsic Amplitude: 12 mV
Lead Channel Sensing Intrinsic Amplitude: 3.6 mV
Lead Channel Setting Pacing Amplitude: 2 V
Lead Channel Setting Pacing Amplitude: 2.25 V
Lead Channel Setting Pacing Pulse Width: 0.5 ms
Lead Channel Setting Pacing Pulse Width: 0.5 ms
Lead Channel Setting Sensing Sensitivity: 2 mV
MDC IDC LEAD IMPLANT DT: 20191213
MDC IDC MSMT LEADCHNL LV PACING THRESHOLD AMPLITUDE: 1.75 V
MDC IDC MSMT LEADCHNL LV PACING THRESHOLD PULSEWIDTH: 0.5 ms
MDC IDC MSMT LEADCHNL RA PACING THRESHOLD PULSEWIDTH: 0.5 ms
MDC IDC MSMT LEADCHNL RV PACING THRESHOLD AMPLITUDE: 0.75 V
MDC IDC SET LEADCHNL LV PACING AMPLITUDE: 3.5 V
Pulse Gen Serial Number: 9064261

## 2018-11-08 NOTE — Progress Notes (Signed)
Wound check appointment. Steri-strips removed. Wound without redness or edema. Incision edges approximated, wound well healed. Stitch removed from L corner of incision, bacitracin ointment and bandaid applied. Patient instructed to remove bandaid later today. Patient to call for any abnormalities with the site. Normal device function. Thresholds, sensing, and impedances consistent with implant measurements. RA/RV auto cap programmed on at implant. RV programmed at 3.5V for extra safety margin until 3 month visit. Histogram distribution appropriate for patient and level of activity. 2.6% AT/AF burden, max dur. 22sec - FFRW, PVAB increased to 177ms. No high ventricular rates noted. Patient educated about wound care, arm mobility, lifting restrictions. ROV in 3 months with GT.

## 2018-11-11 DIAGNOSIS — I428 Other cardiomyopathies: Secondary | ICD-10-CM | POA: Diagnosis not present

## 2018-11-11 DIAGNOSIS — Z0189 Encounter for other specified special examinations: Secondary | ICD-10-CM | POA: Diagnosis not present

## 2018-11-11 DIAGNOSIS — I5042 Chronic combined systolic (congestive) and diastolic (congestive) heart failure: Secondary | ICD-10-CM | POA: Diagnosis not present

## 2018-11-11 DIAGNOSIS — I10 Essential (primary) hypertension: Secondary | ICD-10-CM | POA: Diagnosis not present

## 2018-11-22 IMAGING — MR MR BILATERAL BREAST WITHOUT AND WITH CONTRAST
8 of 12 series · 33 of 48 positions shown · IV contrast (16 multihance)
Comparison: Previous exam(s).

CLINICAL DATA: Recent diagnosis of left breast cancer.

LABS:  Creatinine was obtained on site at [HOSPITAL] at [REDACTED] [HOSPITAL].
Results: Creatinine 0.8 mg/dL.  GFR 70
EXAM:
BILATERAL BREAST MRI WITH AND WITHOUT CONTRAST
TECHNIQUE: Multiplanar, multisequence MR images of both breasts were obtained
prior to and following the intravenous administration of 16 ml of
MultiHance.

[Series 2: t2_tirm_tra ipat (a-p) · axial · 3.0mm · 0.70mm/px · 1 of 55 slices shown]
[im 1/55]
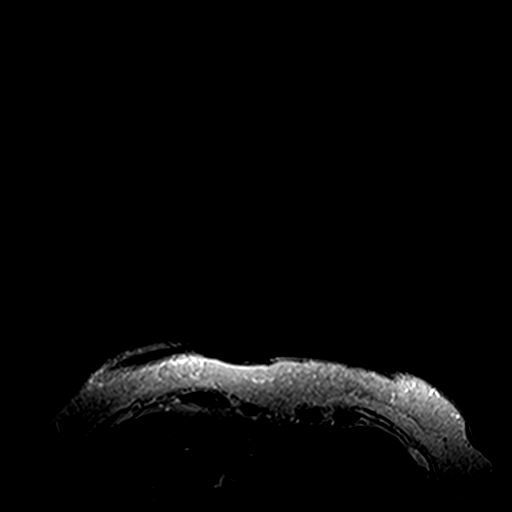

[Series 3: fl3d pre-cm no · axial · non-contrast · 1.2mm · 0.94mm/px · z∈[-62,+110]mm · 5 of 144 slices shown]
[im 1/144]
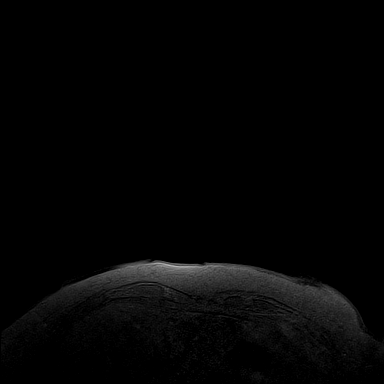
[im 36/144]
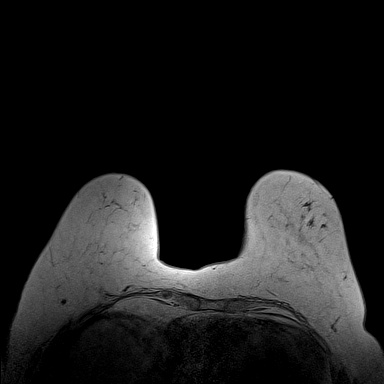
[im 72/144]
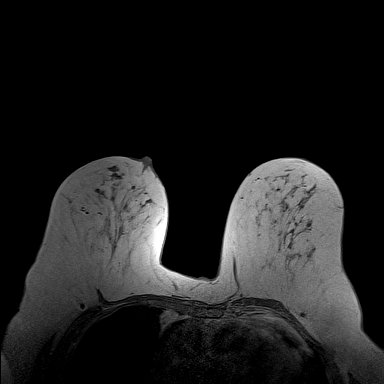
[im 108/144]
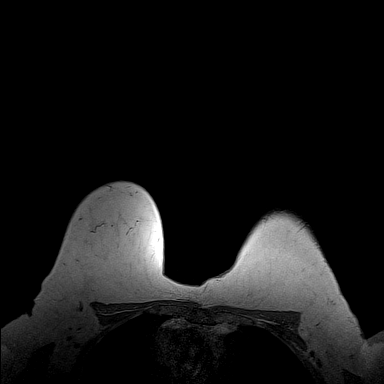
[im 144/144]
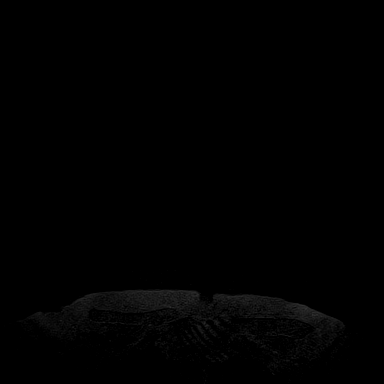

[Series 4: fl3d pre-cm · axial · non-contrast · 1.2mm · 0.94mm/px · z∈[-62,+110]mm · 5 of 144 slices shown]
[im 1/144]
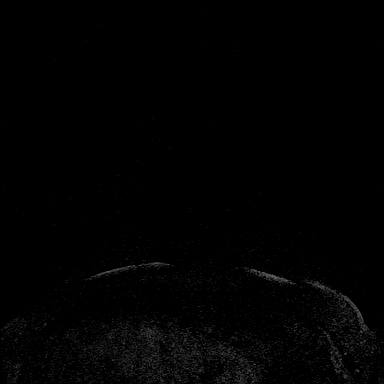
[im 36/144]
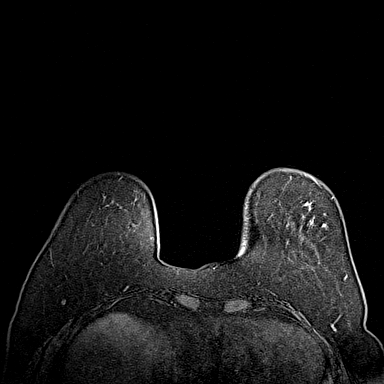
[im 72/144]
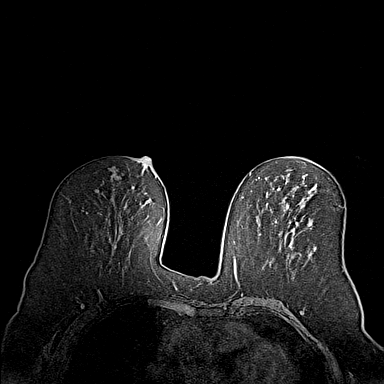
[im 108/144]
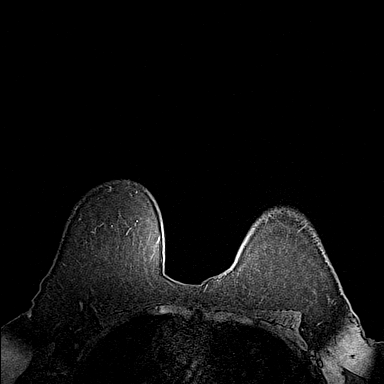
[im 144/144]
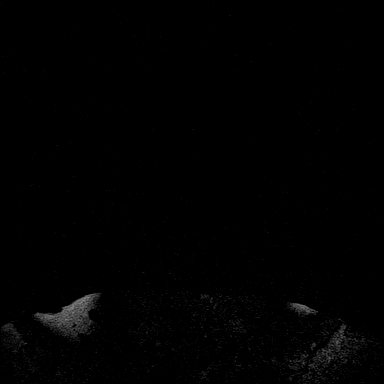

[Series 5: fl3d post-cm 20 · axial · 1.2mm · 0.94mm/px · z∈[-62,+110]mm · 5 of 144 slices shown (1 of 3)]
[im 1/144]
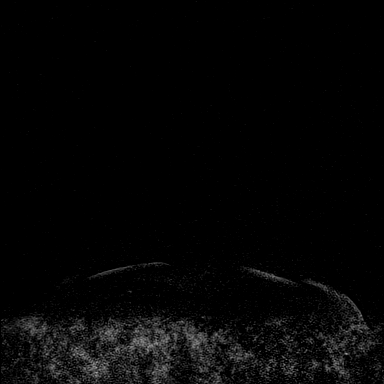
[im 36/144]
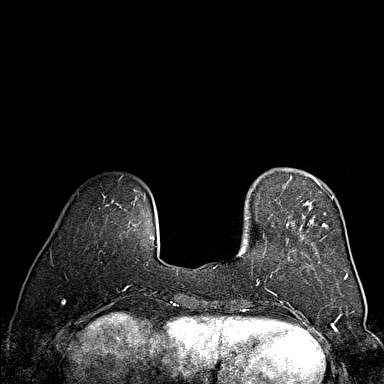
[im 72/144]
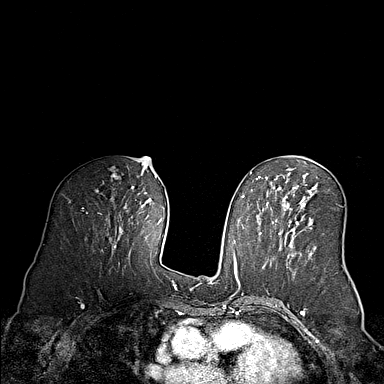
[im 108/144]
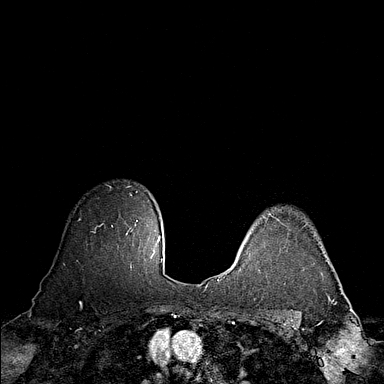
[im 144/144]
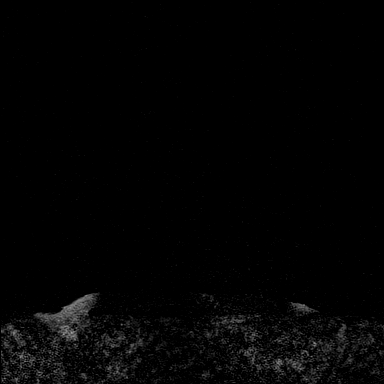

[Series 6: fl3d post-cm 20 · axial · 1.2mm · 0.94mm/px · z∈[-62,+110]mm · 5 of 144 slices shown (2 of 3)]
[im 1/144]
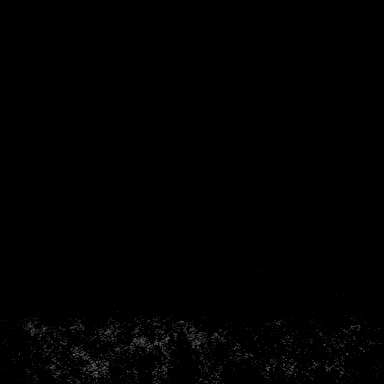
[im 36/144]
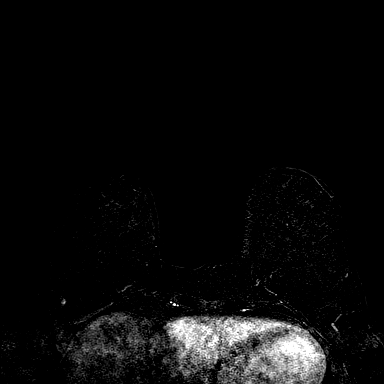
[im 72/144]
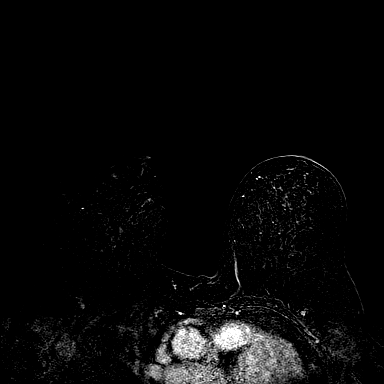
[im 108/144]
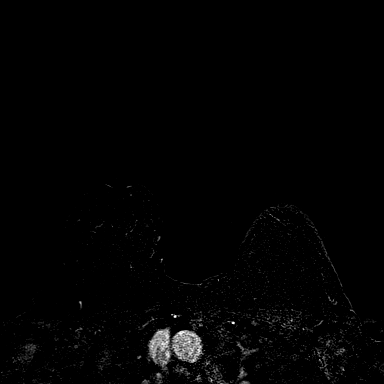
[im 144/144]
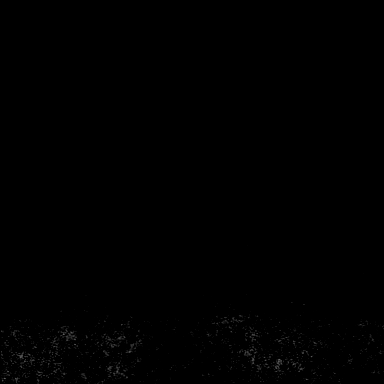

[Series 7: fl3d post-cm 20 · axial · 172.8mm · 0.94mm/px · 1 of 1 slices shown (3 of 3)]
[im 1/1]
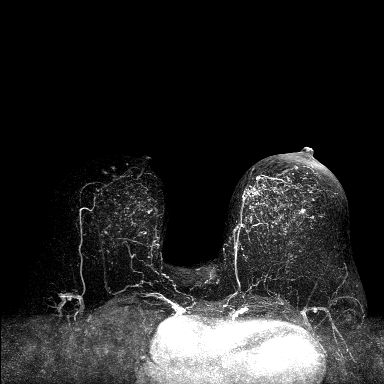

[Series 8: fl3d post-cm 3min · axial · 1.2mm · 0.94mm/px · z∈[-62,+110]mm · 6 of 144 slices shown]
[im 1/144]
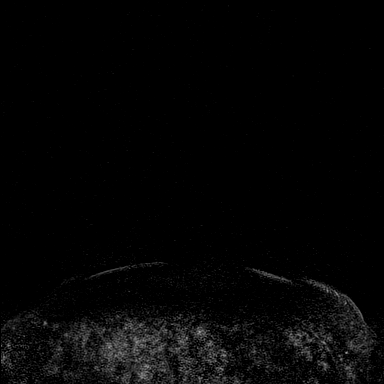
[im 29/144]
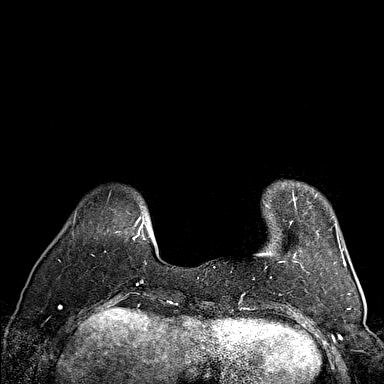
[im 58/144]
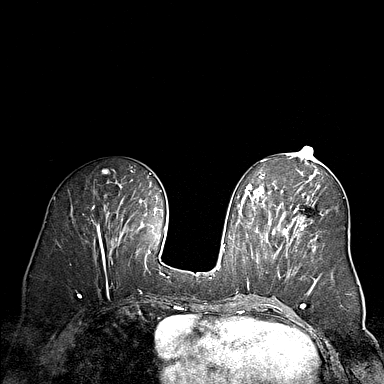
[im 86/144]
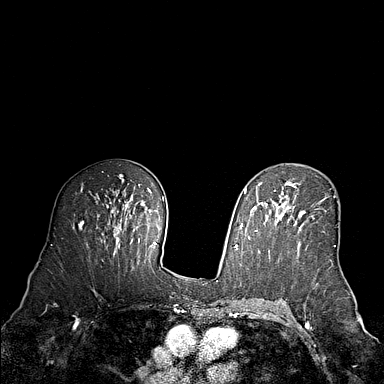
[im 115/144]
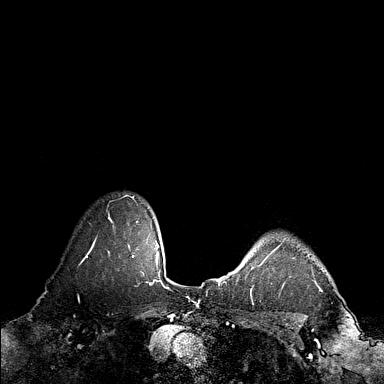
[im 144/144]
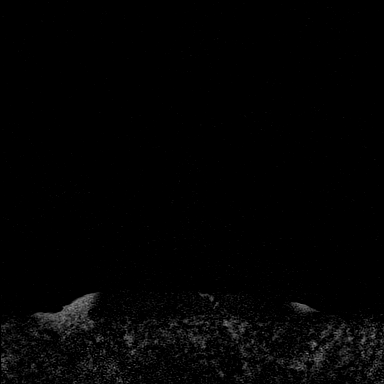

[Series 9: fl3d post-cm 3min_sub · axial · 1.2mm · 0.94mm/px · z∈[-62,+75]mm · 5 of 144 slices shown]
[im 1/144]
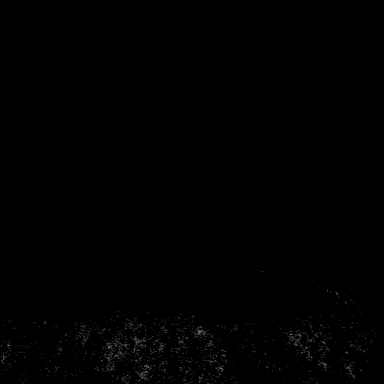
[im 29/144]
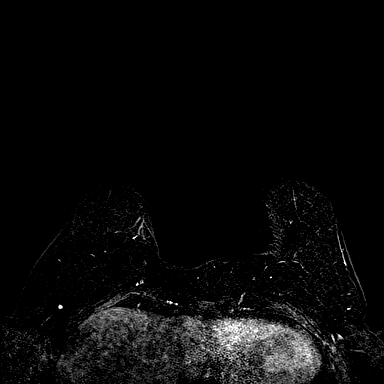
[im 58/144]
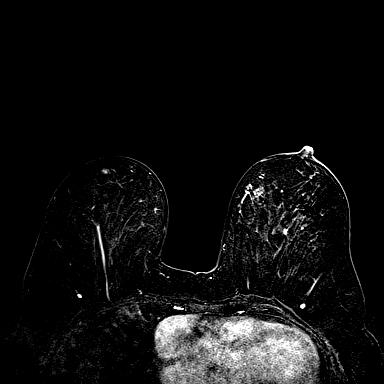
[im 86/144]
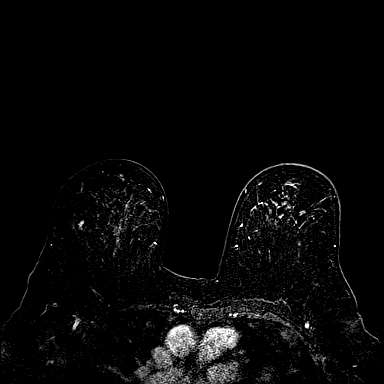
[im 115/144]
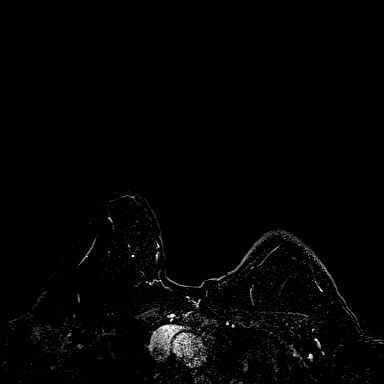

[33 of 48 positions shown; findings below may reference images not displayed]

THREE-DIMENSIONAL MR IMAGE RENDERING ON INDEPENDENT WORKSTATION:

Three-dimensional MR images were rendered by post-processing of the
original MR data on an independent workstation. The
three-dimensional MR images were interpreted, and findings are
reported in the following complete MRI report for this study. Three
dimensional images were evaluated at the independent [employer]
workstation
FINDINGS: Breast composition: b. Scattered fibroglandular tissue.

Background parenchymal enhancement: Moderate.

Right breast: No mass or abnormal enhancement.

Left breast: There is 2 x 0.6 x 1 cm spiculated mass enhancement
with associated biopsy clip with plateau enhancement kinetics at the
level of the nipple 3 o'clock position middle depth consistent with
recently biopsy cancer. At the left breast 9 o'clock level of the
nipple anterior depth, there is a 1.6 x 1.5 x 1.6 cm spiculated
enhancing mass with plateau enhancement kinetics.

Lymph nodes: No abnormal appearing lymph nodes.

Ancillary findings:  None.
IMPRESSION: Suspicious findings.

RECOMMENDATION:
MRI guided core biopsy of left breast 9 o'clock mass.

BI-RADS CATEGORY  4: Suspicious.

## 2018-12-06 DIAGNOSIS — Z950 Presence of cardiac pacemaker: Secondary | ICD-10-CM | POA: Diagnosis not present

## 2018-12-06 DIAGNOSIS — I5042 Chronic combined systolic (congestive) and diastolic (congestive) heart failure: Secondary | ICD-10-CM | POA: Diagnosis not present

## 2018-12-06 DIAGNOSIS — Z45018 Encounter for adjustment and management of other part of cardiac pacemaker: Secondary | ICD-10-CM | POA: Diagnosis not present

## 2018-12-27 ENCOUNTER — Other Ambulatory Visit: Payer: Self-pay

## 2018-12-27 MED ORDER — APIXABAN 5 MG PO TABS: 5.0000 mg | ORAL_TABLET | Freq: Two times a day (BID) | ORAL | 0 refills | Status: DC

## 2018-12-29 ENCOUNTER — Other Ambulatory Visit: Payer: Self-pay | Admitting: Cardiology

## 2018-12-29 ENCOUNTER — Other Ambulatory Visit: Payer: Self-pay

## 2018-12-29 DIAGNOSIS — I480 Paroxysmal atrial fibrillation: Secondary | ICD-10-CM

## 2018-12-29 MED ORDER — FUROSEMIDE 40 MG PO TABS: 40.0000 mg | ORAL_TABLET | Freq: Every day | ORAL | 1 refills | Status: DC

## 2019-01-03 ENCOUNTER — Other Ambulatory Visit: Payer: Self-pay

## 2019-01-03 DIAGNOSIS — I5022 Chronic systolic (congestive) heart failure: Secondary | ICD-10-CM

## 2019-01-03 MED ORDER — FUROSEMIDE 40 MG PO TABS: 40.0000 mg | ORAL_TABLET | Freq: Every day | ORAL | 1 refills | Status: DC

## 2019-01-06 DIAGNOSIS — Z45018 Encounter for adjustment and management of other part of cardiac pacemaker: Secondary | ICD-10-CM | POA: Diagnosis not present

## 2019-01-06 DIAGNOSIS — I5042 Chronic combined systolic (congestive) and diastolic (congestive) heart failure: Secondary | ICD-10-CM | POA: Diagnosis not present

## 2019-01-06 DIAGNOSIS — Z950 Presence of cardiac pacemaker: Secondary | ICD-10-CM | POA: Diagnosis not present

## 2019-01-18 ENCOUNTER — Encounter: Payer: Self-pay | Admitting: Internal Medicine

## 2019-01-25 ENCOUNTER — Telehealth: Payer: Self-pay

## 2019-01-25 DIAGNOSIS — I5022 Chronic systolic (congestive) heart failure: Secondary | ICD-10-CM

## 2019-01-25 NOTE — Telephone Encounter (Signed)
Pt called wondering if she is to still have her blood work done before her appt. On 02/21/2019.   Please Advise.  [place]

## 2019-01-25 NOTE — Telephone Encounter (Signed)
Yes. Needs BMP before next visit. Thanks MJP

## 2019-01-25 NOTE — Telephone Encounter (Signed)
Dr Virgina Jock,   You'll need to put in the order for the BMP.  Thanks

## 2019-01-31 ENCOUNTER — Telehealth: Payer: Self-pay

## 2019-01-31 NOTE — Telephone Encounter (Signed)
Call placed to Pt. Advised at this time-routine follow up appointments are being cancelled to reduce possible exposure for our patients. Per Pt-feels fine.  No issues. Advised appointment would be cancelled and Pt would be contacted to reschedule. Advised to call office if any needs.

## 2019-02-02 ENCOUNTER — Encounter: Payer: Medicare Other | Admitting: Internal Medicine

## 2019-02-02 ENCOUNTER — Other Ambulatory Visit: Payer: Self-pay | Admitting: Cardiology

## 2019-02-06 DIAGNOSIS — I5042 Chronic combined systolic (congestive) and diastolic (congestive) heart failure: Secondary | ICD-10-CM | POA: Diagnosis not present

## 2019-02-06 DIAGNOSIS — Z4502 Encounter for adjustment and management of automatic implantable cardiac defibrillator: Secondary | ICD-10-CM | POA: Diagnosis not present

## 2019-02-06 DIAGNOSIS — Z95810 Presence of automatic (implantable) cardiac defibrillator: Secondary | ICD-10-CM | POA: Diagnosis not present

## 2019-02-07 ENCOUNTER — Other Ambulatory Visit: Payer: Self-pay

## 2019-02-09 ENCOUNTER — Other Ambulatory Visit: Payer: Self-pay | Admitting: Cardiology

## 2019-02-09 NOTE — Telephone Encounter (Signed)
Ok to fill 

## 2019-02-12 ENCOUNTER — Other Ambulatory Visit: Payer: Self-pay | Admitting: Cardiology

## 2019-02-14 NOTE — Telephone Encounter (Signed)
yes

## 2019-02-14 NOTE — Telephone Encounter (Signed)
Can I fill this for pt  

## 2019-02-15 ENCOUNTER — Other Ambulatory Visit: Payer: Self-pay | Admitting: Cardiology

## 2019-02-15 NOTE — Telephone Encounter (Signed)
Ok to fill 

## 2019-02-16 ENCOUNTER — Telehealth: Payer: Self-pay

## 2019-02-16 ENCOUNTER — Other Ambulatory Visit: Payer: Self-pay

## 2019-02-18 ENCOUNTER — Ambulatory Visit: Payer: Self-pay | Admitting: Cardiology

## 2019-02-21 ENCOUNTER — Ambulatory Visit: Payer: Self-pay | Admitting: Cardiology

## 2019-02-23 ENCOUNTER — Other Ambulatory Visit: Payer: Self-pay | Admitting: Cardiology

## 2019-02-23 ENCOUNTER — Other Ambulatory Visit: Payer: Self-pay | Admitting: Oncology

## 2019-02-24 ENCOUNTER — Telehealth: Payer: Self-pay | Admitting: Oncology

## 2019-02-24 NOTE — Telephone Encounter (Signed)
R/s appt per 4/7 sch message - pt aware of new appt date and time

## 2019-02-28 ENCOUNTER — Other Ambulatory Visit: Payer: Self-pay

## 2019-03-01 ENCOUNTER — Other Ambulatory Visit: Payer: Medicare Other

## 2019-03-01 ENCOUNTER — Ambulatory Visit: Payer: Medicare Other | Admitting: Oncology

## 2019-03-09 DIAGNOSIS — I5042 Chronic combined systolic (congestive) and diastolic (congestive) heart failure: Secondary | ICD-10-CM | POA: Diagnosis not present

## 2019-03-09 DIAGNOSIS — Z45018 Encounter for adjustment and management of other part of cardiac pacemaker: Secondary | ICD-10-CM | POA: Diagnosis not present

## 2019-03-09 DIAGNOSIS — Z950 Presence of cardiac pacemaker: Secondary | ICD-10-CM | POA: Diagnosis not present

## 2019-03-14 ENCOUNTER — Ambulatory Visit: Payer: Self-pay | Admitting: Cardiology

## 2019-03-16 DIAGNOSIS — L03011 Cellulitis of right finger: Secondary | ICD-10-CM | POA: Diagnosis not present

## 2019-03-22 ENCOUNTER — Other Ambulatory Visit: Payer: Self-pay | Admitting: Cardiology

## 2019-03-23 NOTE — Telephone Encounter (Signed)
Please fill

## 2019-03-30 ENCOUNTER — Other Ambulatory Visit: Payer: Self-pay | Admitting: Cardiology

## 2019-03-31 NOTE — Telephone Encounter (Signed)
Please fill

## 2019-04-09 DIAGNOSIS — Z45018 Encounter for adjustment and management of other part of cardiac pacemaker: Secondary | ICD-10-CM | POA: Diagnosis not present

## 2019-04-09 DIAGNOSIS — I5042 Chronic combined systolic (congestive) and diastolic (congestive) heart failure: Secondary | ICD-10-CM | POA: Diagnosis not present

## 2019-04-09 DIAGNOSIS — Z950 Presence of cardiac pacemaker: Secondary | ICD-10-CM | POA: Diagnosis not present

## 2019-04-28 ENCOUNTER — Ambulatory Visit (INDEPENDENT_AMBULATORY_CARE_PROVIDER_SITE_OTHER): Payer: Medicare Other | Admitting: Cardiology

## 2019-04-28 ENCOUNTER — Encounter: Payer: Self-pay | Admitting: Cardiology

## 2019-04-28 ENCOUNTER — Other Ambulatory Visit: Payer: Self-pay

## 2019-04-28 VITALS — BP 144/62 | HR 64 | Ht 63.0 in | Wt 175.0 lb

## 2019-04-28 DIAGNOSIS — I480 Paroxysmal atrial fibrillation: Secondary | ICD-10-CM | POA: Diagnosis not present

## 2019-04-28 DIAGNOSIS — I5022 Chronic systolic (congestive) heart failure: Secondary | ICD-10-CM | POA: Diagnosis not present

## 2019-04-28 DIAGNOSIS — I428 Other cardiomyopathies: Secondary | ICD-10-CM | POA: Insufficient documentation

## 2019-04-28 DIAGNOSIS — I420 Dilated cardiomyopathy: Secondary | ICD-10-CM | POA: Diagnosis not present

## 2019-04-28 DIAGNOSIS — I340 Nonrheumatic mitral (valve) insufficiency: Secondary | ICD-10-CM | POA: Insufficient documentation
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
## 2019-05-04 ENCOUNTER — Encounter: Payer: Self-pay | Admitting: Cardiology

## 2019-05-04 ENCOUNTER — Other Ambulatory Visit: Payer: Self-pay | Admitting: Cardiology

## 2019-05-04 DIAGNOSIS — Z45018 Encounter for adjustment and management of other part of cardiac pacemaker: Secondary | ICD-10-CM | POA: Insufficient documentation

## 2019-05-04 DIAGNOSIS — I5022 Chronic systolic (congestive) heart failure: Secondary | ICD-10-CM

## 2019-05-04 HISTORY — DX: Encounter for adjustment and management of other part of cardiac pacemaker: Z45.018

## 2019-05-10 DIAGNOSIS — I5042 Chronic combined systolic (congestive) and diastolic (congestive) heart failure: Secondary | ICD-10-CM | POA: Diagnosis not present

## 2019-05-10 DIAGNOSIS — Z95810 Presence of automatic (implantable) cardiac defibrillator: Secondary | ICD-10-CM | POA: Diagnosis not present

## 2019-05-10 DIAGNOSIS — Z4502 Encounter for adjustment and management of automatic implantable cardiac defibrillator: Secondary | ICD-10-CM | POA: Diagnosis not present

## 2019-05-12 DIAGNOSIS — I5022 Chronic systolic (congestive) heart failure: Secondary | ICD-10-CM | POA: Diagnosis not present

## 2019-05-13 ENCOUNTER — Other Ambulatory Visit: Payer: Self-pay | Admitting: Cardiology

## 2019-05-13 DIAGNOSIS — I5022 Chronic systolic (congestive) heart failure: Secondary | ICD-10-CM

## 2019-05-13 LAB — BASIC METABOLIC PANEL
BUN/Creatinine Ratio: 12 (ref 12–28)
BUN: 19 mg/dL (ref 8–27)
CO2: 21 mmol/L (ref 20–29)
Calcium: 9.7 mg/dL (ref 8.7–10.3)
Chloride: 103 mmol/L (ref 96–106)
Creatinine, Ser: 1.59 mg/dL — ABNORMAL HIGH (ref 0.57–1.00)
GFR calc Af Amer: 35 mL/min/{1.73_m2} — ABNORMAL LOW (ref >59)
GFR calc non Af Amer: 31 mL/min/{1.73_m2} — ABNORMAL LOW (ref >59)
Glucose: 87 mg/dL (ref 65–99)
Potassium: 4.6 mmol/L (ref 3.5–5.2)
Sodium: 141 mmol/L (ref 134–144)

## 2019-05-23 DIAGNOSIS — I5022 Chronic systolic (congestive) heart failure: Secondary | ICD-10-CM | POA: Diagnosis not present

## 2019-05-24 ENCOUNTER — Telehealth: Payer: Self-pay | Admitting: Cardiology

## 2019-05-24 DIAGNOSIS — I5022 Chronic systolic (congestive) heart failure: Secondary | ICD-10-CM

## 2019-05-24 DIAGNOSIS — E875 Hyperkalemia: Secondary | ICD-10-CM

## 2019-05-24 LAB — BASIC METABOLIC PANEL
BUN/Creatinine Ratio: 14 (ref 12–28)
BUN: 24 mg/dL (ref 8–27)
CO2: 22 mmol/L (ref 20–29)
Calcium: 9.7 mg/dL (ref 8.7–10.3)
Chloride: 105 mmol/L (ref 96–106)
Creatinine, Ser: 1.69 mg/dL — ABNORMAL HIGH (ref 0.57–1.00)
GFR calc Af Amer: 33 mL/min/{1.73_m2} — ABNORMAL LOW (ref >59)
GFR calc non Af Amer: 28 mL/min/{1.73_m2} — ABNORMAL LOW (ref >59)
Glucose: 93 mg/dL (ref 65–99)
Potassium: 5.8 mmol/L — ABNORMAL HIGH (ref 3.5–5.2)
Sodium: 141 mmol/L (ref 134–144)

## 2019-05-24 NOTE — Telephone Encounter (Addendum)
Reviewed lab results with the patient. Cr 1.69-up from 1.59 2 weeks ago, and 1.1 7 months ago. K 5.8.  Recommended the following, till  [employer] (currently taking 49-51 mg 1 tab bid) Hold spironolactone (currently taking 25 mg daily) Avoid K rich foods.  Take metoprolol 3 tabs of 25 mg daily (currently taking 1 tab of 25 mg daily).  Check BMP on Thursday 07/09. Follow up on Friday 07/10 with Binnie Kand, NP at 11 AM. If Cr trending down and K <5, resume Entresto at 24-26 mg bid (will need samples, as patient only has Entresto 49-51 mg). Continue to hold spironolactone. May continue metoprolol succinate at 3 tabs of 25 mg daily, if BP tolerates. Also, give LoKelma samples.  Repeat BMP ordered for 07/17 (ordered) and f/u with me on 7/23.  Nigel Mormon, MD [facility]. [place] Pager: [phone] Office: [phone] If no answer Cell [phone]

## 2019-05-26 DIAGNOSIS — I5022 Chronic systolic (congestive) heart failure: Secondary | ICD-10-CM | POA: Diagnosis not present

## 2019-05-27 ENCOUNTER — Ambulatory Visit (INDEPENDENT_AMBULATORY_CARE_PROVIDER_SITE_OTHER): Payer: Medicare Other | Admitting: Cardiology

## 2019-05-27 ENCOUNTER — Other Ambulatory Visit: Payer: Self-pay

## 2019-05-27 ENCOUNTER — Encounter: Payer: Self-pay | Admitting: Cardiology

## 2019-05-27 VITALS — BP 157/72 | HR 62 | Temp 98.0°F | Ht 63.0 in | Wt 177.0 lb

## 2019-05-27 DIAGNOSIS — E875 Hyperkalemia: Secondary | ICD-10-CM

## 2019-05-27 DIAGNOSIS — I10 Essential (primary) hypertension: Secondary | ICD-10-CM

## 2019-05-27 DIAGNOSIS — I129 Hypertensive chronic kidney disease with stage 1 through stage 4 chronic kidney disease, or unspecified chronic kidney disease: Secondary | ICD-10-CM | POA: Diagnosis not present

## 2019-05-27 DIAGNOSIS — N183 Chronic kidney disease, stage 3 unspecified: Secondary | ICD-10-CM

## 2019-05-27 DIAGNOSIS — I5022 Chronic systolic (congestive) heart failure: Secondary | ICD-10-CM | POA: Diagnosis not present

## 2019-05-27 LAB — BASIC METABOLIC PANEL
BUN/Creatinine Ratio: 16 (ref 12–28)
BUN: 24 mg/dL (ref 8–27)
CO2: 22 mmol/L (ref 20–29)
Calcium: 9.3 mg/dL (ref 8.7–10.3)
Chloride: 102 mmol/L (ref 96–106)
Creatinine, Ser: 1.48 mg/dL — ABNORMAL HIGH (ref 0.57–1.00)
GFR calc Af Amer: 39 mL/min/{1.73_m2} — ABNORMAL LOW (ref >59)
GFR calc non Af Amer: 33 mL/min/{1.73_m2} — ABNORMAL LOW (ref >59)
Glucose: 89 mg/dL (ref 65–99)
Potassium: 4.7 mmol/L (ref 3.5–5.2)
Sodium: 139 mmol/L (ref 134–144)

## 2019-05-27 MED ORDER — ENTRESTO 24-26 MG PO TABS: 1.0000 | ORAL_TABLET | Freq: Two times a day (BID) | ORAL | 1 refills | Status: DC

## 2019-05-27 MED ORDER — METOPROLOL SUCCINATE ER 50 MG PO TB24: 50.0000 mg | ORAL_TABLET | Freq: Two times a day (BID) | ORAL | 1 refills | Status: DC

## 2019-05-27 NOTE — Patient Instructions (Addendum)
Have labs to recheck kidney function on 07/17 at [facility]  Follow up appt with Dr. Virgina Jock on 07/23
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
## 2019-05-28 ENCOUNTER — Other Ambulatory Visit: Payer: Self-pay | Admitting: Oncology

## 2019-05-30 ENCOUNTER — Other Ambulatory Visit: Payer: Self-pay | Admitting: Cardiology

## 2019-06-09 ENCOUNTER — Ambulatory Visit (INDEPENDENT_AMBULATORY_CARE_PROVIDER_SITE_OTHER): Payer: Medicare Other | Admitting: Cardiology

## 2019-06-09 ENCOUNTER — Encounter: Payer: Self-pay | Admitting: Cardiology

## 2019-06-09 ENCOUNTER — Other Ambulatory Visit: Payer: Self-pay

## 2019-06-09 VITALS — BP 138/50 | HR 62 | Temp 99.1°F | Wt 181.0 lb

## 2019-06-09 DIAGNOSIS — I10 Essential (primary) hypertension: Secondary | ICD-10-CM | POA: Diagnosis not present

## 2019-06-09 DIAGNOSIS — I5022 Chronic systolic (congestive) heart failure: Secondary | ICD-10-CM

## 2019-06-09 DIAGNOSIS — I480 Paroxysmal atrial fibrillation: Secondary | ICD-10-CM
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
## 2019-06-10 ENCOUNTER — Other Ambulatory Visit: Payer: Self-pay

## 2019-06-10 ENCOUNTER — Telehealth: Payer: Self-pay

## 2019-06-10 ENCOUNTER — Encounter: Payer: Self-pay | Admitting: Cardiology

## 2019-06-10 DIAGNOSIS — Z950 Presence of cardiac pacemaker: Secondary | ICD-10-CM | POA: Diagnosis not present

## 2019-06-10 DIAGNOSIS — Z45018 Encounter for adjustment and management of other part of cardiac pacemaker: Secondary | ICD-10-CM | POA: Diagnosis not present

## 2019-06-10 DIAGNOSIS — I5042 Chronic combined systolic (congestive) and diastolic (congestive) heart failure: Secondary | ICD-10-CM | POA: Diagnosis not present

## 2019-06-10 NOTE — Telephone Encounter (Signed)
-----   Message from Adrian Prows, MD sent at 06/10/2019  6:41 AM EDT ----- Regarding: Pacemaker I sent her this: Please call to remind  Your recent transmission suggests that you are accumulating fluid. I recommend you watch out for calories, salt intake and excessive fluid intake. If weight gain by > 2 Lbs recently, please take Lasix (Furosemide) one tablet in morning and one in the late afternoon for 3-4 days. Call if shortness of breath or leg swelling getting worse.

## 2019-06-10 NOTE — Telephone Encounter (Signed)
Pt aware and will call.

## 2019-06-13 ENCOUNTER — Other Ambulatory Visit: Payer: Self-pay | Admitting: Cardiology

## 2019-06-14 NOTE — Telephone Encounter (Signed)
I have discontinued this.

## 2019-06-23 ENCOUNTER — Other Ambulatory Visit (HOSPITAL_COMMUNITY): Payer: Self-pay | Admitting: Cardiology

## 2019-06-23 DIAGNOSIS — I10 Essential (primary) hypertension: Secondary | ICD-10-CM | POA: Diagnosis not present

## 2019-06-24 LAB — BASIC METABOLIC PANEL
BUN/Creatinine Ratio: 16 (ref 12–28)
BUN: 20 mg/dL (ref 8–27)
CO2: 18 mmol/L — ABNORMAL LOW (ref 20–29)
Calcium: 9.5 mg/dL (ref 8.7–10.3)
Chloride: 106 mmol/L (ref 96–106)
Creatinine, Ser: 1.29 mg/dL — ABNORMAL HIGH (ref 0.57–1.00)
GFR calc Af Amer: 46 mL/min/{1.73_m2} — ABNORMAL LOW (ref >59)
GFR calc non Af Amer: 39 mL/min/{1.73_m2} — ABNORMAL LOW (ref >59)
Glucose: 80 mg/dL (ref 65–99)
Potassium: 4.4 mmol/L (ref 3.5–5.2)
Sodium: 143 mmol/L (ref 134–144)

## 2019-07-06 ENCOUNTER — Other Ambulatory Visit: Payer: Self-pay | Admitting: Cardiology

## 2019-07-06 DIAGNOSIS — I5022 Chronic systolic (congestive) heart failure: Secondary | ICD-10-CM

## 2019-07-07 ENCOUNTER — Ambulatory Visit: Payer: Medicare Other | Admitting: Cardiology

## 2019-07-08 ENCOUNTER — Encounter: Payer: Self-pay | Admitting: Cardiology

## 2019-07-08 ENCOUNTER — Ambulatory Visit (INDEPENDENT_AMBULATORY_CARE_PROVIDER_SITE_OTHER): Payer: Medicare Other | Admitting: Cardiology

## 2019-07-08 VITALS — BP 162/72 | HR 60 | Ht 63.0 in | Wt 183.0 lb

## 2019-07-08 DIAGNOSIS — I480 Paroxysmal atrial fibrillation: Secondary | ICD-10-CM

## 2019-07-08 DIAGNOSIS — I5022 Chronic systolic (congestive) heart failure: Secondary | ICD-10-CM | POA: Diagnosis not present

## 2019-07-08 NOTE — Patient Instructions (Signed)
The following are your heart medications  For heart failure: Entresto 49-51 mg 1 tab twice a day Metoprolol succinate 50 mg 2 tab once/day Lasix 40 mg 1 tab once a day  For atrial fibrillation: Eliquis 5 mg 1 tab twice a day  For cholesterol: Atorvastatin 10 mg once a day
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
## 2019-07-09 ENCOUNTER — Encounter: Payer: Self-pay | Admitting: Cardiology

## 2019-07-11 DIAGNOSIS — Z4502 Encounter for adjustment and management of automatic implantable cardiac defibrillator: Secondary | ICD-10-CM | POA: Diagnosis not present

## 2019-07-11 DIAGNOSIS — Z95810 Presence of automatic (implantable) cardiac defibrillator: Secondary | ICD-10-CM | POA: Diagnosis not present

## 2019-07-11 DIAGNOSIS — I5042 Chronic combined systolic (congestive) and diastolic (congestive) heart failure: Secondary | ICD-10-CM | POA: Diagnosis not present

## 2019-07-12 ENCOUNTER — Encounter: Payer: Self-pay | Admitting: Cardiology

## 2019-07-12 DIAGNOSIS — I495 Sick sinus syndrome: Secondary | ICD-10-CM

## 2019-07-12 HISTORY — DX: Sick sinus syndrome: I49.5

## 2019-07-22 ENCOUNTER — Other Ambulatory Visit (HOSPITAL_COMMUNITY): Payer: Self-pay | Admitting: Cardiology

## 2019-07-22 DIAGNOSIS — I5022 Chronic systolic (congestive) heart failure: Secondary | ICD-10-CM | POA: Diagnosis not present

## 2019-07-23 LAB — BASIC METABOLIC PANEL
BUN/Creatinine Ratio: 14 (ref 12–28)
BUN: 15 mg/dL (ref 8–27)
CO2: 23 mmol/L (ref 20–29)
Calcium: 9.1 mg/dL (ref 8.7–10.3)
Chloride: 103 mmol/L (ref 96–106)
Creatinine, Ser: 1.11 mg/dL — ABNORMAL HIGH (ref 0.57–1.00)
GFR calc Af Amer: 55 mL/min/{1.73_m2} — ABNORMAL LOW (ref >59)
GFR calc non Af Amer: 47 mL/min/{1.73_m2} — ABNORMAL LOW (ref >59)
Glucose: 94 mg/dL (ref 65–99)
Potassium: 3.8 mmol/L (ref 3.5–5.2)
Sodium: 142 mmol/L (ref 134–144)

## 2019-07-29 ENCOUNTER — Other Ambulatory Visit: Payer: Self-pay

## 2019-07-29 ENCOUNTER — Encounter: Payer: Self-pay | Admitting: Cardiology

## 2019-07-29 ENCOUNTER — Ambulatory Visit (INDEPENDENT_AMBULATORY_CARE_PROVIDER_SITE_OTHER): Payer: Medicare Other

## 2019-07-29 ENCOUNTER — Ambulatory Visit (INDEPENDENT_AMBULATORY_CARE_PROVIDER_SITE_OTHER): Payer: Medicare Other | Admitting: Cardiology

## 2019-07-29 VITALS — BP 167/80 | HR 60 | Temp 97.3°F | Ht 63.0 in | Wt 185.3 lb

## 2019-07-29 DIAGNOSIS — Z950 Presence of cardiac pacemaker: Secondary | ICD-10-CM | POA: Diagnosis not present

## 2019-07-29 DIAGNOSIS — Z45018 Encounter for adjustment and management of other part of cardiac pacemaker: Secondary | ICD-10-CM

## 2019-07-29 DIAGNOSIS — I420 Dilated cardiomyopathy: Secondary | ICD-10-CM | POA: Diagnosis not present

## 2019-07-29 DIAGNOSIS — I447 Left bundle-branch block, unspecified: Secondary | ICD-10-CM

## 2019-07-29 DIAGNOSIS — I480 Paroxysmal atrial fibrillation: Secondary | ICD-10-CM

## 2019-07-29 DIAGNOSIS — I5022 Chronic systolic (congestive) heart failure: Secondary | ICD-10-CM | POA: Diagnosis not present

## 2019-07-29 MED ORDER — METOPROLOL SUCCINATE ER 100 MG PO TB24: 100.0000 mg | ORAL_TABLET | Freq: Every day | ORAL | 2 refills | Status: DC
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
## 2019-08-01 ENCOUNTER — Telehealth: Payer: Self-pay | Admitting: Oncology

## 2019-08-01 NOTE — Telephone Encounter (Signed)
Returned patient's phone call, patient called to confirm date and time of appointments.

## 2019-08-05 ENCOUNTER — Telehealth: Payer: Self-pay | Admitting: Oncology

## 2019-08-05 NOTE — Telephone Encounter (Signed)
Called pt to confirm telephone visit for 9/21 pt does not have VM to leave a message

## 2019-08-05 NOTE — Telephone Encounter (Signed)
Called patient regarding upcoming [facility] appointment, patient does not have access to do a virtual visit and needs this to be a phone visit.  Message to provider.
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
## 2019-08-08 ENCOUNTER — Inpatient Hospital Stay: Payer: Medicare Other | Attending: Oncology | Admitting: Oncology

## 2019-08-08 ENCOUNTER — Other Ambulatory Visit: Payer: Medicare Other

## 2019-08-08 DIAGNOSIS — Z170 Estrogen receptor positive status [ER+]: Secondary | ICD-10-CM

## 2019-08-08 DIAGNOSIS — I5022 Chronic systolic (congestive) heart failure: Secondary | ICD-10-CM | POA: Diagnosis not present

## 2019-08-08 DIAGNOSIS — C50412 Malignant neoplasm of upper-outer quadrant of left female breast: Secondary | ICD-10-CM

## 2019-08-09 ENCOUNTER — Telehealth: Payer: Self-pay | Admitting: Oncology

## 2019-08-09 LAB — BASIC METABOLIC PANEL
BUN/Creatinine Ratio: 14 (ref 12–28)
BUN: 17 mg/dL (ref 8–27)
CO2: 27 mmol/L (ref 20–29)
Calcium: 9.2 mg/dL (ref 8.7–10.3)
Chloride: 104 mmol/L (ref 96–106)
Creatinine, Ser: 1.24 mg/dL — ABNORMAL HIGH (ref 0.57–1.00)
GFR calc Af Amer: 48 mL/min/{1.73_m2} — ABNORMAL LOW (ref >59)
GFR calc non Af Amer: 41 mL/min/{1.73_m2} — ABNORMAL LOW (ref >59)
Glucose: 112 mg/dL — ABNORMAL HIGH (ref 65–99)
Potassium: 3.7 mmol/L (ref 3.5–5.2)
Sodium: 143 mmol/L (ref 134–144)

## 2019-08-09 NOTE — Telephone Encounter (Signed)
I talk with patient regarding schedule  

## 2019-08-11 DIAGNOSIS — Z95810 Presence of automatic (implantable) cardiac defibrillator: Secondary | ICD-10-CM | POA: Diagnosis not present

## 2019-08-11 DIAGNOSIS — I5042 Chronic combined systolic (congestive) and diastolic (congestive) heart failure: Secondary | ICD-10-CM | POA: Diagnosis not present

## 2019-08-11 DIAGNOSIS — Z4502 Encounter for adjustment and management of automatic implantable cardiac defibrillator: Secondary | ICD-10-CM

## 2019-08-12 ENCOUNTER — Ambulatory Visit: Payer: Medicare Other

## 2019-08-16 ENCOUNTER — Other Ambulatory Visit: Payer: Self-pay

## 2019-08-16 ENCOUNTER — Ambulatory Visit: Payer: Medicare Other | Admitting: Cardiology

## 2019-08-16 VITALS — BP 154/71 | HR 79

## 2019-08-16 DIAGNOSIS — I10 Essential (primary) hypertension: Secondary | ICD-10-CM

## 2019-08-17 MED ORDER — AMLODIPINE BESYLATE 5 MG PO TABS: 5.0000 mg | ORAL_TABLET | Freq: Every day | ORAL | 2 refills | Status: DC

## 2019-08-17 NOTE — Progress Notes (Signed)
Blood pressure continues to be elevated, will add amlodipine 5 mg daily. Will continue with follow up as previously scheduled.

## 2019-08-19 DIAGNOSIS — H25812 Combined forms of age-related cataract, left eye: Secondary | ICD-10-CM | POA: Diagnosis not present

## 2019-08-19 DIAGNOSIS — H2512 Age-related nuclear cataract, left eye: Secondary | ICD-10-CM | POA: Diagnosis not present

## 2019-08-19 DIAGNOSIS — Z01818 Encounter for other preprocedural examination: Secondary | ICD-10-CM | POA: Diagnosis not present

## 2019-08-23 ENCOUNTER — Other Ambulatory Visit: Payer: Self-pay | Admitting: Cardiology

## 2019-08-23 ENCOUNTER — Other Ambulatory Visit: Payer: Self-pay | Admitting: Oncology

## 2019-08-23 NOTE — Telephone Encounter (Signed)
Pt aware of recent transmissions

## 2019-08-26 ENCOUNTER — Encounter: Payer: Self-pay | Admitting: Cardiology

## 2019-08-29 DIAGNOSIS — Z23 Encounter for immunization: Secondary | ICD-10-CM | POA: Diagnosis not present

## 2019-09-11 DIAGNOSIS — I5042 Chronic combined systolic (congestive) and diastolic (congestive) heart failure: Secondary | ICD-10-CM | POA: Diagnosis not present

## 2019-09-11 DIAGNOSIS — Z950 Presence of cardiac pacemaker: Secondary | ICD-10-CM

## 2019-09-11 DIAGNOSIS — Z45018 Encounter for adjustment and management of other part of cardiac pacemaker: Secondary | ICD-10-CM

## 2019-09-14 ENCOUNTER — Other Ambulatory Visit: Payer: Self-pay | Admitting: Cardiology

## 2019-09-16 DIAGNOSIS — H25811 Combined forms of age-related cataract, right eye: Secondary | ICD-10-CM | POA: Diagnosis not present

## 2019-09-16 DIAGNOSIS — H2511 Age-related nuclear cataract, right eye: Secondary | ICD-10-CM | POA: Diagnosis not present

## 2019-09-23 ENCOUNTER — Telehealth: Payer: Self-pay

## 2019-09-23 NOTE — Telephone Encounter (Signed)
She should be getting Entresto 97-103 mg bid. This means 1 tab of 97-193 mg bid or 2 tabs of 49-51 mg bid.  [place] [place] staff,  Please arrange appt with me or AK in next 1-2 months for f/u.   Thanks MJP

## 2019-09-28 MED ORDER — ENTRESTO 49-51 MG PO TABS: 2.0000 | ORAL_TABLET | Freq: Two times a day (BID) | ORAL | 11 refills | Status: DC

## 2019-10-05 ENCOUNTER — Other Ambulatory Visit: Payer: Self-pay

## 2019-10-10 ENCOUNTER — Other Ambulatory Visit: Payer: Self-pay | Admitting: Cardiology

## 2019-10-10 DIAGNOSIS — I5022 Chronic systolic (congestive) heart failure: Secondary | ICD-10-CM

## 2019-11-12 DIAGNOSIS — I5042 Chronic combined systolic (congestive) and diastolic (congestive) heart failure: Secondary | ICD-10-CM | POA: Diagnosis not present

## 2019-11-12 DIAGNOSIS — Z4502 Encounter for adjustment and management of automatic implantable cardiac defibrillator: Secondary | ICD-10-CM | POA: Diagnosis not present

## 2019-11-12 DIAGNOSIS — Z95810 Presence of automatic (implantable) cardiac defibrillator: Secondary | ICD-10-CM | POA: Diagnosis not present

## 2019-11-14 ENCOUNTER — Other Ambulatory Visit: Payer: Self-pay | Admitting: Cardiology

## 2019-11-14 ENCOUNTER — Other Ambulatory Visit: Payer: Self-pay | Admitting: Oncology

## 2019-11-15 ENCOUNTER — Encounter: Payer: Self-pay | Admitting: Cardiology

## 2019-11-27 NOTE — Progress Notes (Signed)
Follow up visit  Subjective:   Erin Novak, female    DOB: November 06, 1941, 79 y.o.   MRN: [id number]   Chief Complaint  Patient presents with  . Congestive Heart Failure  . Hypertension  . Follow-up    65mo  79y/o Caucasian female w/NICM EF 20%, severe mitral and tricuspid regurgitation, hypertension, paroxysmal Afib, hyperlipidemia, h/o breast cancer s/p radiation therapy 2018, now s/p CRT-P placement 10/2018  Patient is doing well without any complaints of chest pain or shortness of breath. She endorses excess calorie intake during holidays.   Current Outpatient Medications on File Prior to Visit  Medication Sig Dispense Refill  . amLODipine (NORVASC) 5 MG tablet Take 1 tablet by mouth once daily 90 tablet 0  . anastrozole (ARIMIDEX) 1 MG tablet Take 1 tablet by mouth once daily 90 tablet 0  . atorvastatin (LIPITOR) 10 MG tablet Take 1 tablet by mouth once daily 90 tablet 0  . ELIQUIS 5 MG TABS tablet Take 1 tablet by mouth twice daily 180 tablet 0  . furosemide (LASIX) 40 MG tablet Take 1 tablet by mouth once daily 90 tablet 0  . levothyroxine (SYNTHROID, LEVOTHROID) 88 MCG tablet Take 88 mcg by mouth every morning.     . loratadine (CLARITIN) 10 MG tablet Take 10 mg by mouth daily.    . metoprolol succinate (TOPROL-XL) 100 MG 24 hr tablet Take 1 tablet (100 mg total) by mouth daily. Take with or immediately following a meal. 90 tablet 2  . sacubitril-valsartan (ENTRESTO) 97-103 MG Take 1 tablet by mouth 2 (two) times daily.     No current facility-administered medications on file prior to visit.    Cardiovascular studies:  EKG 11/28/2019: Sinus rhythm 66 bpm. Ventricular pacing.  Echocardiogram 07/29/2019: Left ventricle cavity is normal in size. Normal left ventricular wall thickness. Mildly depressed LV systolic function with visual EF 45-50%. Normal global wall motion. Doppler evidence of grade I (impaired) diastolic dysfunction, normal LAP.  Left atrial cavity is  mildly dilated. Moderate (Grade II) mitral regurgitation. Mild tricuspid regurgitation. No evidence of pulmonary hypertension. Compared to previous study on 09/25/2018, LVEF is improved. Mitral and tricuspid regurgitation less severe. Pulmonary hypertension no longer present.   EKG 04/28/2019: BiV paced rhythm.  BiV pacemaker placement 10/29/2018: 1. Successful implantation of a St. 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 No early apparent complications.   RHC/LHC 10/12/2018: Normal coronary arteries with minimal luminal irregularities No angiographically significant coronary artery disease  RA: 18 mmHg RV: 44/9 mmHg, RVEDP 21 mmHg PA: 51/25 mmHg. 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 Systolic function was severely reduced. The   estimated ejection fraction was in the range of 20% to 25%.   Diffuse hypokinesis. The study is not technically sufficient to   allow evaluation of LV diastolic function. - Ventricular septum: Septal motion showed abnormal function and   dyssynergy. These changes are consistent with a left bundle   branch block. - Mitral valve: Premature closure of the MV leaflets suggests   elevated LVEDP. There was severe regurgitation directed centrally   and posteriorly. - Left atrium: The atrium was moderately dilated. - Right ventricle: The cavity size  was mildly dilated. - Right atrium: The atrium was mildly dilated. - Atrial septum: No defect or patent foramen ovale was identified. - Tricuspid valve: There was moderate-severe regurgitation. - Pulmonary arteries: PA peak pressure: 47 mm Hg (S).  Impressions:  - The right ventricular  systolic pressure was increased consistent   with moderate pulmonary hypertension.  Recent labs: 08/08/2019: Glucose 112, BUN/Cr 17/1.24. EGFR 41. Na/K 143/3.7.  10/29/2018: H/H 17.5/55.2. MCV 92. Platelets 242 Chol 8, TG 47, HDL 31, LDL 49   Review of Systems  Review of Systems  Constitution: Positive for weight gain.  Cardiovascular: Negative for chest pain, dyspnea on exertion (Mild, stable), leg swelling, palpitations and syncope.          Vitals:   11/28/19 1133  BP: (!) 146/75  Pulse: 71  SpO2: 97%     Body mass index is 35.07 kg/m. Filed Weights   11/28/19 1133  Weight: 198 lb (89.8 kg)     Objective:   Physical Exam  Physical Exam  Constitutional: She appears well-developed and well-nourished.  Neck: No JVD present.  Cardiovascular: Normal rate, regular rhythm and intact distal pulses.  Murmur heard. High-pitched blowing holosystolic murmur is present with a grade of 2/6 at the apex. Pulmonary/Chest: Effort normal and breath sounds normal. She has no wheezes. She has no rales.  Musculoskeletal:        General: No edema.  Nursing note and vitals reviewed.         Assessment & Recommendations:   79 y/o Caucasian female w/nonischemic cardiomyopathy with recovered EF, mild t mod MR, TR, hypertension, paroxysmal Afib, hyperlipidemia, h/o breast cancer s/p radiation therapy 2018, now s/p CRT-P placement 10/2018.  Nonischemic cardiomyopathy: NYHA class II.  LVEF improved to 45-50% (07/2019) with guideline directed medical therapy, including BiV pacing. Continue guideline directed medical therapy. Increase Entresto to 97-103 mg bid. Check BMP 10 days. BP check visit in 2 weeks.   Refilled metoprolol succinate at 100 mg daily. Not on Spironolactone due to hyperkalemia. Emphasized dietary compliance.   Scheduled Remote pacemaker check 11/07/2019:  No AHR or VHR episodes. Battery longevity is 7.9 years. RA pacing is 80.0 %, RV pacing is 99.0 %, and LV  pacing is 99.0 %. Health trends (patient activity & average heart rates) are stable. Corvue impedance monitoring does not suggest recent fluid accumulation.  MR, TR: Improved in severity with CRT-P.  Paroxysmal Afib: CHA2DS2VASc score 5, annual stroke risk 7.2% Continue eliquis 5 mg bid.  BMP and OV f/u in 3 months  Jaremy Nosal Esther Hardy, MD [facility] Pager: [phone] Office: [phone] If no answer Cell [phone]

## 2019-11-28 ENCOUNTER — Encounter: Payer: Self-pay | Admitting: Cardiology

## 2019-11-28 ENCOUNTER — Ambulatory Visit (INDEPENDENT_AMBULATORY_CARE_PROVIDER_SITE_OTHER): Payer: Medicare Other | Admitting: Cardiology

## 2019-11-28 ENCOUNTER — Other Ambulatory Visit: Payer: Self-pay

## 2019-11-28 VITALS — BP 146/75 | HR 71 | Ht 63.0 in | Wt 198.0 lb

## 2019-11-28 DIAGNOSIS — I10 Essential (primary) hypertension: Secondary | ICD-10-CM | POA: Diagnosis not present

## 2019-11-28 DIAGNOSIS — Z950 Presence of cardiac pacemaker: Secondary | ICD-10-CM

## 2019-11-28 DIAGNOSIS — I5022 Chronic systolic (congestive) heart failure: Secondary | ICD-10-CM | POA: Diagnosis not present

## 2019-11-28 DIAGNOSIS — I480 Paroxysmal atrial fibrillation: Secondary | ICD-10-CM | POA: Diagnosis not present

## 2019-11-28 NOTE — Patient Instructions (Signed)
BMP in 3 months, OV after that

## 2019-12-16 ENCOUNTER — Ambulatory Visit: Payer: Medicare Other

## 2019-12-17 ENCOUNTER — Other Ambulatory Visit: Payer: Self-pay | Admitting: Cardiology

## 2019-12-17 ENCOUNTER — Ambulatory Visit: Payer: Medicare Other

## 2019-12-22 ENCOUNTER — Ambulatory Visit: Payer: Medicare Other

## 2019-12-22 ENCOUNTER — Other Ambulatory Visit: Payer: Self-pay | Admitting: Cardiology

## 2019-12-26 ENCOUNTER — Ambulatory Visit: Payer: Medicare Other

## 2020-01-02 DIAGNOSIS — Z950 Presence of cardiac pacemaker: Secondary | ICD-10-CM | POA: Diagnosis not present

## 2020-01-02 DIAGNOSIS — I5042 Chronic combined systolic (congestive) and diastolic (congestive) heart failure: Secondary | ICD-10-CM | POA: Diagnosis not present

## 2020-01-02 DIAGNOSIS — Z45018 Encounter for adjustment and management of other part of cardiac pacemaker: Secondary | ICD-10-CM | POA: Diagnosis not present

## 2020-01-06 ENCOUNTER — Ambulatory Visit: Payer: Medicare Other

## 2020-01-19 ENCOUNTER — Other Ambulatory Visit: Payer: Self-pay | Admitting: Cardiology

## 2020-01-19 DIAGNOSIS — I5022 Chronic systolic (congestive) heart failure: Secondary | ICD-10-CM

## 2020-02-02 DIAGNOSIS — Z95810 Presence of automatic (implantable) cardiac defibrillator: Secondary | ICD-10-CM | POA: Diagnosis not present

## 2020-02-02 DIAGNOSIS — Z4502 Encounter for adjustment and management of automatic implantable cardiac defibrillator: Secondary | ICD-10-CM | POA: Diagnosis not present

## 2020-02-02 DIAGNOSIS — I5042 Chronic combined systolic (congestive) and diastolic (congestive) heart failure: Secondary | ICD-10-CM | POA: Diagnosis not present

## 2020-02-06 DIAGNOSIS — I5022 Chronic systolic (congestive) heart failure: Secondary | ICD-10-CM | POA: Diagnosis not present

## 2020-02-07 LAB — BASIC METABOLIC PANEL
BUN/Creatinine Ratio: 12 (ref 12–28)
BUN: 14 mg/dL (ref 8–27)
CO2: 23 mmol/L (ref 20–29)
Calcium: 9.4 mg/dL (ref 8.7–10.3)
Chloride: 103 mmol/L (ref 96–106)
Creatinine, Ser: 1.18 mg/dL — ABNORMAL HIGH (ref 0.57–1.00)
GFR calc Af Amer: 50 mL/min/{1.73_m2} — ABNORMAL LOW (ref >59)
GFR calc non Af Amer: 44 mL/min/{1.73_m2} — ABNORMAL LOW (ref >59)
Glucose: 99 mg/dL (ref 65–99)
Potassium: 3.6 mmol/L (ref 3.5–5.2)
Sodium: 145 mmol/L — ABNORMAL HIGH (ref 134–144)

## 2020-02-17 ENCOUNTER — Other Ambulatory Visit: Payer: Self-pay | Admitting: Oncology

## 2020-02-20 ENCOUNTER — Other Ambulatory Visit: Payer: Self-pay | Admitting: Oncology

## 2020-03-04 ENCOUNTER — Other Ambulatory Visit: Payer: Self-pay | Admitting: Cardiology

## 2020-03-04 NOTE — Progress Notes (Signed)
Rescheduled

## 2020-03-05 ENCOUNTER — Ambulatory Visit: Payer: Medicare Other | Admitting: Cardiology

## 2020-03-05 DIAGNOSIS — I5042 Chronic combined systolic (congestive) and diastolic (congestive) heart failure: Secondary | ICD-10-CM | POA: Diagnosis not present

## 2020-03-05 DIAGNOSIS — Z4502 Encounter for adjustment and management of automatic implantable cardiac defibrillator: Secondary | ICD-10-CM | POA: Diagnosis not present

## 2020-03-05 DIAGNOSIS — Z95810 Presence of automatic (implantable) cardiac defibrillator: Secondary | ICD-10-CM | POA: Diagnosis not present

## 2020-03-07 ENCOUNTER — Other Ambulatory Visit: Payer: Self-pay

## 2020-03-07 ENCOUNTER — Telehealth: Payer: Medicare Other | Admitting: Cardiology

## 2020-03-07 ENCOUNTER — Encounter: Payer: Self-pay | Admitting: Cardiology

## 2020-03-07 DIAGNOSIS — I10 Essential (primary) hypertension: Secondary | ICD-10-CM

## 2020-03-07 DIAGNOSIS — I480 Paroxysmal atrial fibrillation: Secondary | ICD-10-CM

## 2020-03-07 DIAGNOSIS — I5022 Chronic systolic (congestive) heart failure: Secondary | ICD-10-CM | POA: Diagnosis not present

## 2020-03-07 DIAGNOSIS — N1832 Chronic kidney disease, stage 3b: Secondary | ICD-10-CM | POA: Diagnosis not present

## 2020-03-07 NOTE — Progress Notes (Signed)
Follow up visit  Subjective:   Erin Novak, female    DOB: December 16, 1940, 79 y.o.   MRN: [id number]  I connected with the patient on 03/07/2020 by a telephone call and verified that I am speaking with the correct person using two identifiers.     I offered the patient a video enabled application for a virtual visit. Unfortunately, this could not be accomplished due to technical difficulties/lack of video enabled phone/computer. I discussed the limitations of evaluation and management by telemedicine and the availability of in person appointments. The patient expressed understanding and agreed to proceed.   This visit type was conducted due to national recommendations for restrictions regarding the COVID-19 Pandemic (e.g. social distancing).  This format is felt to be most appropriate for this patient at this time.  All issues noted in this document were discussed and addressed.  No physical exam was performed (except for noted visual exam findings with Tele health visits).  The patient has consented to conduct a Tele health visit and understands insurance will be billed.   Chief Complaint  Patient presents with  . Congestive Heart Failure  . Follow-up    3 month  . Results    lab   79 y/o Caucasian female w/NICM EF 20%, severe mitral and tricuspid regurgitation, hypertension, paroxysmal Afib, hyperlipidemia, h/o breast cancer s/p radiation therapy 2018, now s/p CRT-P placement 10/2018  Patient is doing well without any complaints of chest pain or shortness of breath. She does not have any means to check her BP regularly. She denies chest pain, shortness of breath, palpitations, leg edema, orthopnea, PND, TIA/syncope.   Current Outpatient Medications on File Prior to Visit  Medication Sig Dispense Refill  . amLODipine (NORVASC) 5 MG tablet Take 1 tablet by mouth once daily 90 tablet 0  . anastrozole (ARIMIDEX) 1 MG tablet Take 1 tablet by mouth once daily 90 tablet 0  . atorvastatin  (LIPITOR) 10 MG tablet Take 1 tablet by mouth once daily 90 tablet 0  . ELIQUIS 5 MG TABS tablet Take 1 tablet by mouth twice daily 180 tablet 0  . furosemide (LASIX) 40 MG tablet Take 1 tablet by mouth once daily 90 tablet 0  . levothyroxine (SYNTHROID, LEVOTHROID) 88 MCG tablet Take 88 mcg by mouth every morning.     . loratadine (CLARITIN) 10 MG tablet Take 10 mg by mouth daily.    . metoprolol succinate (TOPROL-XL) 100 MG 24 hr tablet Take 1 tablet (100 mg total) by mouth daily. Take with or immediately following a meal. 90 tablet 2  . sacubitril-valsartan (ENTRESTO) 97-103 MG Take 1 tablet by mouth 2 (two) times daily.     No current facility-administered medications on file prior to visit.    Cardiovascular studies:  Scheduled Remote pacemaker check 01/30/2020:  There were 0 atrial high rate episodes detected. There were 0 ventricular high rate episodes detected. Battery longevity is 8.1 years. RA pacing is 67.0 %, RV pacing is 97.0 %, and LV pacing is 97.0 %. Health trends (patient activity & average heart rates) are stable. Corvue impedance monitoring does not suggest recent fluid accumulation.  EKG 11/28/2019: Sinus rhythm 66 bpm. Ventricular pacing.  Echocardiogram 07/29/2019: Left ventricle cavity is normal in size. Normal left ventricular wall thickness. Mildly depressed LV systolic function with visual EF 45-50%. Normal global wall motion. Doppler evidence of grade I (impaired) diastolic dysfunction, normal LAP.  Left atrial cavity is mildly dilated. Moderate (Grade II) mitral regurgitation. Mild tricuspid regurgitation.  No evidence of pulmonary hypertension. Compared to previous study on 09/25/2018, LVEF is improved. Mitral and tricuspid regurgitation less severe. Pulmonary hypertension no longer present.   EKG 04/28/2019: BiV paced rhythm.  BiV pacemaker placement 10/29/2018: 1. Successful implantation of a St. Jude BiV pacemaker for symptomatic LBBB AV block and chronic  systolic heart failure  2. No early apparent complications.   RHC/LHC 10/12/2018: Normal coronary arteries with minimal luminal irregularities No angiographically significant coronary artery disease  RA: 18 mmHg RV: 44/9 mmHg, RVEDP 21 mmHg PA: 51/25 mmHg. 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 EGFR 44. Na/K 145/3.6.   08/08/2019: Glucose 112, BUN/Cr 17/1.24. EGFR 41. Na/K 143/3.7.  10/29/2018: H/H 17.5/55.2. MCV 92. Platelets 242 Chol 89, TG 47, HDL 31, LDL 49   Review of Systems  Review of Systems  Constitution: Positive for weight gain.  Cardiovascular: Negative for chest pain, dyspnea on exertion (Mild, stable), leg swelling, palpitations and syncope.         No vitals available   Objective:    Physical Exam  Not performed. Telephone visit.      Assessment & Recommendations:   79 y/o Caucasian female w/nonischemic cardiomyopathy with recovered EF, mild t mod MR, TR, hypertension, paroxysmal Afib, hyperlipidemia, h/o breast cancer s/p radiation therapy 2018, now s/p CRT-P placement 10/2018.  Nonischemic cardiomyopathy: NYHA class II.  LVEF improved to 45-50% (07/2019) with guideline directed medical therapy, including BiV pacing. Continue guideline directed medical therapy. 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 Continue metoprolol succinate 100 mg daily. Not on Spironolactone due to hyperkalemia. Emphasized dietary compliance.   MR, TR: Improved in severity with CRT-P.  Paroxysmal Afib: CHA2DS2VASc score 5, annual stroke risk 7.2% Continue eliquis 5 mg bid.  Enrolled in patient monitoring for blood pressure and weight checks.  F/u in 3 months  [place], MD [facility]  Cardiovascular. [place] Pager: [phone] Office: [phone] If no answer Cell [phone]

## 2020-03-08 ENCOUNTER — Encounter: Payer: Self-pay | Admitting: Cardiology

## 2020-03-08 DIAGNOSIS — N1832 Chronic kidney disease, stage 3b: Secondary | ICD-10-CM | POA: Insufficient documentation
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
## 2020-03-26 ENCOUNTER — Inpatient Hospital Stay: Payer: Medicare Other

## 2020-03-26 ENCOUNTER — Other Ambulatory Visit: Payer: Self-pay

## 2020-03-26 ENCOUNTER — Inpatient Hospital Stay: Payer: Medicare Other | Attending: Oncology | Admitting: Oncology

## 2020-03-26 VITALS — BP 146/62 | HR 62 | Temp 98.3°F | Resp 18 | Ht 63.0 in | Wt 202.7 lb

## 2020-03-26 DIAGNOSIS — Z170 Estrogen receptor positive status [ER+]: Secondary | ICD-10-CM | POA: Insufficient documentation

## 2020-03-26 DIAGNOSIS — I480 Paroxysmal atrial fibrillation: Secondary | ICD-10-CM

## 2020-03-26 DIAGNOSIS — C50412 Malignant neoplasm of upper-outer quadrant of left female breast: Secondary | ICD-10-CM | POA: Diagnosis not present

## 2020-03-26 DIAGNOSIS — I10 Essential (primary) hypertension: Secondary | ICD-10-CM | POA: Diagnosis not present

## 2020-03-26 DIAGNOSIS — Z950 Presence of cardiac pacemaker: Secondary | ICD-10-CM

## 2020-03-26 DIAGNOSIS — N1832 Chronic kidney disease, stage 3b: Secondary | ICD-10-CM | POA: Diagnosis not present

## 2020-03-26 LAB — CBC WITH DIFFERENTIAL/PLATELET
Abs Immature Granulocytes: 0.01 10*3/uL (ref 0.00–0.07)
Basophils Absolute: 0.1 10*3/uL (ref 0.0–0.1)
Basophils Relative: 1 %
Eosinophils Absolute: 0.1 10*3/uL (ref 0.0–0.5)
Eosinophils Relative: 2 %
HCT: 38.5 % (ref 36.0–46.0)
Hemoglobin: 12.5 g/dL (ref 12.0–15.0)
Immature Granulocytes: 0 %
Lymphocytes Relative: 27 %
Lymphs Abs: 1.8 10*3/uL (ref 0.7–4.0)
MCH: 30.7 pg (ref 26.0–34.0)
MCHC: 32.5 g/dL (ref 30.0–36.0)
MCV: 94.6 fL (ref 80.0–100.0)
Monocytes Absolute: 0.8 10*3/uL (ref 0.1–1.0)
Monocytes Relative: 12 %
Neutro Abs: 3.8 10*3/uL (ref 1.7–7.7)
Neutrophils Relative %: 58 %
Platelets: 187 10*3/uL (ref 150–400)
RBC: 4.07 MIL/uL (ref 3.87–5.11)
RDW: 13.6 % (ref 11.5–15.5)
WBC: 6.5 10*3/uL (ref 4.0–10.5)
nRBC: 0 % (ref 0.0–0.2)

## 2020-03-26 LAB — COMPREHENSIVE METABOLIC PANEL
ALT: 13 U/L (ref 0–44)
AST: 19 U/L (ref 15–41)
Albumin: 3.8 g/dL (ref 3.5–5.0)
Alkaline Phosphatase: 85 U/L (ref 38–126)
Anion gap: 9 (ref 5–15)
BUN: 14 mg/dL (ref 8–23)
CO2: 27 mmol/L (ref 22–32)
Calcium: 9.1 mg/dL (ref 8.9–10.3)
Chloride: 106 mmol/L (ref 98–111)
Creatinine, Ser: 1.28 mg/dL — ABNORMAL HIGH (ref 0.44–1.00)
GFR calc Af Amer: 46 mL/min — ABNORMAL LOW (ref >60)
GFR calc non Af Amer: 39 mL/min — ABNORMAL LOW (ref >60)
Glucose, Bld: 88 mg/dL (ref 70–99)
Potassium: 3.8 mmol/L (ref 3.5–5.1)
Sodium: 142 mmol/L (ref 135–145)
Total Bilirubin: 0.5 mg/dL (ref 0.3–1.2)
Total Protein: 7.5 g/dL (ref 6.5–8.1)

## 2020-03-26 MED ORDER — ANASTROZOLE 1 MG PO TABS: 1.0000 mg | ORAL_TABLET | Freq: Every day | ORAL | 4 refills | Status: DC

## 2020-03-27 ENCOUNTER — Telehealth: Payer: Self-pay | Admitting: Oncology

## 2020-03-27 NOTE — Telephone Encounter (Signed)
Scheduled appts per 5/10 los. Pt confirmed appt date and time.

## 2020-03-28 ENCOUNTER — Other Ambulatory Visit: Payer: Self-pay | Admitting: Cardiology

## 2020-04-04 DIAGNOSIS — I10 Essential (primary) hypertension: Secondary | ICD-10-CM | POA: Diagnosis not present

## 2020-04-10 DIAGNOSIS — E039 Hypothyroidism, unspecified: Secondary | ICD-10-CM | POA: Diagnosis not present

## 2020-04-10 DIAGNOSIS — E7800 Pure hypercholesterolemia, unspecified: Secondary | ICD-10-CM | POA: Diagnosis not present

## 2020-04-10 DIAGNOSIS — I10 Essential (primary) hypertension: Secondary | ICD-10-CM | POA: Diagnosis not present

## 2020-04-10 DIAGNOSIS — E559 Vitamin D deficiency, unspecified: Secondary | ICD-10-CM | POA: Diagnosis not present

## 2020-04-13 DIAGNOSIS — I10 Essential (primary) hypertension: Secondary | ICD-10-CM | POA: Diagnosis not present

## 2020-04-14 ENCOUNTER — Other Ambulatory Visit: Payer: Self-pay | Admitting: Cardiology

## 2020-04-14 DIAGNOSIS — I5022 Chronic systolic (congestive) heart failure: Secondary | ICD-10-CM

## 2020-04-17 DIAGNOSIS — R413 Other amnesia: Secondary | ICD-10-CM | POA: Diagnosis not present

## 2020-04-17 DIAGNOSIS — Z0000 Encounter for general adult medical examination without abnormal findings: Secondary | ICD-10-CM | POA: Diagnosis not present

## 2020-04-17 DIAGNOSIS — N183 Chronic kidney disease, stage 3 unspecified: Secondary | ICD-10-CM | POA: Diagnosis not present

## 2020-04-17 DIAGNOSIS — R7301 Impaired fasting glucose: Secondary | ICD-10-CM | POA: Diagnosis not present

## 2020-04-17 DIAGNOSIS — E669 Obesity, unspecified: Secondary | ICD-10-CM | POA: Diagnosis not present

## 2020-04-17 DIAGNOSIS — E039 Hypothyroidism, unspecified: Secondary | ICD-10-CM | POA: Diagnosis not present

## 2020-04-17 DIAGNOSIS — I509 Heart failure, unspecified: Secondary | ICD-10-CM | POA: Diagnosis not present

## 2020-05-02 ENCOUNTER — Other Ambulatory Visit: Payer: Self-pay

## 2020-05-02 ENCOUNTER — Ambulatory Visit
Admission: RE | Admit: 2020-05-02 | Discharge: 2020-05-02 | Disposition: A | Discharge status: Home / Self Care | Payer: Medicare Other | Source: Ambulatory Visit | Attending: Oncology | Admitting: Oncology

## 2020-05-02 DIAGNOSIS — N1832 Chronic kidney disease, stage 3b: Secondary | ICD-10-CM

## 2020-05-02 DIAGNOSIS — I480 Paroxysmal atrial fibrillation: Secondary | ICD-10-CM

## 2020-05-02 DIAGNOSIS — I10 Essential (primary) hypertension: Secondary | ICD-10-CM

## 2020-05-02 DIAGNOSIS — Z170 Estrogen receptor positive status [ER+]: Secondary | ICD-10-CM

## 2020-05-02 DIAGNOSIS — Z950 Presence of cardiac pacemaker: Secondary | ICD-10-CM

## 2020-05-16 ENCOUNTER — Other Ambulatory Visit: Payer: Self-pay | Admitting: Cardiology

## 2020-05-16 DIAGNOSIS — I5022 Chronic systolic (congestive) heart failure: Secondary | ICD-10-CM

## 2020-05-16 DIAGNOSIS — I10 Essential (primary) hypertension: Secondary | ICD-10-CM | POA: Diagnosis not present

## 2020-05-25 ENCOUNTER — Other Ambulatory Visit: Payer: Self-pay | Admitting: Cardiology

## 2020-06-08 ENCOUNTER — Ambulatory Visit: Payer: Medicare Other | Admitting: Cardiology

## 2020-06-08 NOTE — Progress Notes (Signed)
Rescheduled

## 2020-06-16 DIAGNOSIS — I10 Essential (primary) hypertension: Secondary | ICD-10-CM | POA: Diagnosis not present

## 2020-06-26 ENCOUNTER — Encounter: Payer: Self-pay | Admitting: Cardiology

## 2020-06-26 ENCOUNTER — Ambulatory Visit: Payer: Medicare Other | Admitting: Cardiology

## 2020-06-26 ENCOUNTER — Other Ambulatory Visit: Payer: Self-pay

## 2020-06-26 VITALS — BP 146/61 | HR 71 | Resp 16 | Ht 63.0 in | Wt 203.0 lb

## 2020-06-26 DIAGNOSIS — I480 Paroxysmal atrial fibrillation: Secondary | ICD-10-CM

## 2020-06-26 DIAGNOSIS — I5022 Chronic systolic (congestive) heart failure: Secondary | ICD-10-CM

## 2020-06-26 DIAGNOSIS — I10 Essential (primary) hypertension: Secondary | ICD-10-CM

## 2020-06-26 MED ORDER — APIXABAN 5 MG PO TABS: 5.0000 mg | ORAL_TABLET | Freq: Two times a day (BID) | ORAL | 2 refills | Status: DC

## 2020-06-26 NOTE — Progress Notes (Signed)
Follow up visit  Subjective:   Werner Lean, female    DOB: 05-28-1941, 79 y.o.   MRN: [id number]    Chief Complaint  Patient presents with  . Paroxysmal Atrial Fibrillation  . Follow-up    3 month   79 y/o Caucasian female w/NICM EF 20%, severe mitral and tricuspid regurgitation, hypertension, paroxysmal Afib, hyperlipidemia, h/o breast cancer s/p radiation therapy 2018, now s/p CRT-P placement 10/2018  Patient is doing well without any complaints of chest pain or shortness of breath. She does not have any means to check her BP regularly. She denies chest pain, shortness of breath, palpitations, leg edema, orthopnea, PND, TIA/syncope.  BP elevated today, but much better controlled, as seen on home monitoring data.   Current Outpatient Medications on File Prior to Visit  Medication Sig Dispense Refill  . amLODipine (NORVASC) 5 MG tablet Take 1 tablet by mouth once daily 90 tablet 0  . anastrozole (ARIMIDEX) 1 MG tablet Take 1 tablet (1 mg total) by mouth daily. 90 tablet 4  . atorvastatin (LIPITOR) 10 MG tablet Take 1 tablet by mouth once daily 90 tablet 0  . ELIQUIS 5 MG TABS tablet Take 1 tablet by mouth twice daily 180 tablet 0  . furosemide (LASIX) 40 MG tablet Take 1 tablet by mouth once daily 90 tablet 0  . levothyroxine (SYNTHROID, LEVOTHROID) 88 MCG tablet Take 88 mcg by mouth every morning.     . loratadine (CLARITIN) 10 MG tablet Take 10 mg by mouth daily.    . metoprolol succinate (TOPROL-XL) 100 MG 24 hr tablet TAKE 1 TABLET BY MOUTH ONCE DAILY WITH MEALS OR  IMMEDIATELY  FOLLOWING 90 tablet 0  . sacubitril-valsartan (ENTRESTO) 97-103 MG Take 1 tablet by mouth 2 (two) times daily.     No current facility-administered medications on file prior to visit.    Cardiovascular studies:  Scheduled Remote pacemaker check 06/10/2020:  There were 2 atrial high rate episodes detected. The longest lasted 8 seconds in duration. Review of EGM demonstrates paroxysmal atrial  tachycardia. There was a <1 % cumulative atrial arrhythmia burden. There were 0 ventricular high rate episodes detected. Corvue impedance monitoring does not suggest recent fluid accumulation. Battery longevity is 7.8 years. RA pacing is 66.0 %, RV pacing is 96.0 %, and LV pacing is 96.0 %.  Scheduled Remote pacemaker check 01/30/2020:  There were 0 atrial high rate episodes detected. There were 0 ventricular high rate episodes detected. Battery longevity is 8.1 years. RA pacing is 67.0 %, RV pacing is 97.0 %, and LV pacing is 97.0 %. Health trends (patient activity & average heart rates) are stable. Corvue impedance monitoring does not suggest recent fluid accumulation.  EKG 11/28/2019: Sinus rhythm 66 bpm. Ventricular pacing.  Echocardiogram 07/29/2019: Left ventricle cavity is normal in size. Normal left ventricular wall thickness. Mildly depressed LV systolic function with visual EF 45-50%. Normal global wall motion. Doppler evidence of grade I (impaired) diastolic dysfunction, normal LAP.  Left atrial cavity is mildly dilated. Moderate (Grade II) mitral regurgitation. Mild tricuspid regurgitation. No evidence of pulmonary hypertension. Compared to previous study on 09/25/2018, LVEF is improved. Mitral and tricuspid regurgitation less severe. Pulmonary hypertension no longer present.   EKG 04/28/2019: BiV paced rhythm.  BiV pacemaker placement 10/29/2018: 1. Successful implantation of a St. 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 No early apparent complications.   RHC/LHC 10/12/2018: Normal coronary arteries with minimal luminal irregularities No  angiographically significant coronary artery disease  RA: 18 mmHg RV: 44/9 mmHg, RVEDP 21 mmHg PA: 51/25 mmHg. 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 EGFR 39. Na/K 142/3.8. Rest of the CMP normal H/H 12/38. MCV 94. Platelets 187  02/06/2020: Glucose 99, BUN/Cr 14/1.18. EGFR 44. Na/K 145/3.6.   08/08/2019: Glucose 112, BUN/Cr 17/1.24. EGFR 41. Na/K 143/3.7.  10/29/2018: H/H 17.5/55.2. MCV 92. Platelets 242 Chol 89, TG 47, HDL 31, LDL 49   Review of Systems  Review of Systems  Constitutional: Positive for weight gain.  Cardiovascular: Negative for chest pain, dyspnea on exertion (Mild, stable), leg swelling, palpitations and syncope.         Vitals:   06/26/20 1508  BP: (!) 146/61  Pulse: 71  Resp: 16  SpO2: 96%      Objective:    Physical Exam Vitals and nursing note reviewed.  Constitutional:      General: She is not in acute distress. Neck:     Vascular: No JVD.  Cardiovascular:     Rate and Rhythm: Normal rate and regular rhythm.     Heart sounds: Normal heart sounds. No murmur heard.   Pulmonary:     Effort: Pulmonary effort is normal.     Breath sounds: Normal breath sounds. No wheezing or rales.          Assessment & Recommendations:   79 y/o Caucasian female w/nonischemic cardiomyopathy with recovered EF, mild t mod MR, TR, hypertension, paroxysmal Afib, hyperlipidemia, h/o breast cancer s/p radiation therapy 2018, now s/p CRT-P placement 10/2018.  Nonischemic cardiomyopathy: NYHA class II.  LVEF improved to 45-50% (07/2019) with guideline directed medical therapy, including BiV pacing. Continue guideline directed medical therapy. 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 Continue metoprolol succinate 100 mg daily. Not on Spironolactone due to hyperkalemia. Emphasized dietary compliance.   MR, TR: Improved in severity with CRT-P.  Paroxysmal Afib: CHA2DS2VASc score 5, annual stroke risk 7.2% Continue eliquis 5 mg bid.  Hypertension: Well controlled. Laso on amlodipine  5 mg.  Enrolled in patient monitoring for blood pressure and weight checks.  F/u in 6 months  Jasper Ruminski Esther Hardy, MD [facility]. [place] Pager: [phone] Office: [phone] If no answer Cell [phone]

## 2020-07-02 DIAGNOSIS — Z95810 Presence of automatic (implantable) cardiac defibrillator: Secondary | ICD-10-CM | POA: Diagnosis not present

## 2020-07-02 DIAGNOSIS — I5042 Chronic combined systolic (congestive) and diastolic (congestive) heart failure: Secondary | ICD-10-CM | POA: Diagnosis not present

## 2020-07-02 DIAGNOSIS — Z4502 Encounter for adjustment and management of automatic implantable cardiac defibrillator: Secondary | ICD-10-CM | POA: Diagnosis not present

## 2020-07-17 DIAGNOSIS — I10 Essential (primary) hypertension: Secondary | ICD-10-CM | POA: Diagnosis not present

## 2020-07-25 ENCOUNTER — Other Ambulatory Visit: Payer: Self-pay | Admitting: Cardiology

## 2020-07-25 DIAGNOSIS — I5022 Chronic systolic (congestive) heart failure: Secondary | ICD-10-CM

## 2020-08-06 DIAGNOSIS — Z95810 Presence of automatic (implantable) cardiac defibrillator: Secondary | ICD-10-CM | POA: Diagnosis not present

## 2020-08-06 DIAGNOSIS — I5042 Chronic combined systolic (congestive) and diastolic (congestive) heart failure: Secondary | ICD-10-CM | POA: Diagnosis not present

## 2020-08-06 DIAGNOSIS — Z4502 Encounter for adjustment and management of automatic implantable cardiac defibrillator: Secondary | ICD-10-CM | POA: Diagnosis not present

## 2020-08-13 ENCOUNTER — Other Ambulatory Visit: Payer: Self-pay | Admitting: Cardiology

## 2020-08-13 DIAGNOSIS — I5022 Chronic systolic (congestive) heart failure: Secondary | ICD-10-CM

## 2020-08-16 DIAGNOSIS — I10 Essential (primary) hypertension: Secondary | ICD-10-CM | POA: Diagnosis not present

## 2020-08-23 ENCOUNTER — Other Ambulatory Visit: Payer: Self-pay | Admitting: Cardiology

## 2020-08-24 DIAGNOSIS — Z23 Encounter for immunization: Secondary | ICD-10-CM | POA: Diagnosis not present

## 2020-08-29 DIAGNOSIS — Z23 Encounter for immunization: Secondary | ICD-10-CM | POA: Diagnosis not present

## 2020-09-06 DIAGNOSIS — Z45018 Encounter for adjustment and management of other part of cardiac pacemaker: Secondary | ICD-10-CM | POA: Diagnosis not present

## 2020-09-06 DIAGNOSIS — I5022 Chronic systolic (congestive) heart failure: Secondary | ICD-10-CM | POA: Diagnosis not present

## 2020-09-06 DIAGNOSIS — Z950 Presence of cardiac pacemaker: Secondary | ICD-10-CM | POA: Diagnosis not present

## 2020-09-09 ENCOUNTER — Other Ambulatory Visit: Payer: Self-pay | Admitting: Cardiology

## 2020-09-16 DIAGNOSIS — I10 Essential (primary) hypertension: Secondary | ICD-10-CM | POA: Diagnosis not present

## 2020-10-07 DIAGNOSIS — Z4502 Encounter for adjustment and management of automatic implantable cardiac defibrillator: Secondary | ICD-10-CM | POA: Diagnosis not present

## 2020-10-07 DIAGNOSIS — Z95810 Presence of automatic (implantable) cardiac defibrillator: Secondary | ICD-10-CM | POA: Diagnosis not present

## 2020-10-07 DIAGNOSIS — I5042 Chronic combined systolic (congestive) and diastolic (congestive) heart failure: Secondary | ICD-10-CM | POA: Diagnosis not present

## 2020-10-16 DIAGNOSIS — I10 Essential (primary) hypertension: Secondary | ICD-10-CM | POA: Diagnosis not present

## 2020-10-24 ENCOUNTER — Other Ambulatory Visit: Payer: Self-pay

## 2020-10-24 MED ORDER — SACUBITRIL-VALSARTAN 97-103 MG PO TABS: 1.0000 | ORAL_TABLET | Freq: Two times a day (BID) | ORAL | 3 refills | Status: DC

## 2020-11-07 DIAGNOSIS — Z95810 Presence of automatic (implantable) cardiac defibrillator: Secondary | ICD-10-CM | POA: Diagnosis not present

## 2020-11-07 DIAGNOSIS — Z4502 Encounter for adjustment and management of automatic implantable cardiac defibrillator: Secondary | ICD-10-CM | POA: Diagnosis not present

## 2020-11-07 DIAGNOSIS — I5042 Chronic combined systolic (congestive) and diastolic (congestive) heart failure: Secondary | ICD-10-CM | POA: Diagnosis not present

## 2020-11-16 DIAGNOSIS — I10 Essential (primary) hypertension: Secondary | ICD-10-CM | POA: Diagnosis not present

## 2020-12-07 ENCOUNTER — Other Ambulatory Visit: Payer: Self-pay

## 2020-12-07 MED ORDER — SACUBITRIL-VALSARTAN 97-103 MG PO TABS: 1.0000 | ORAL_TABLET | Freq: Two times a day (BID) | ORAL | 3 refills | Status: DC

## 2020-12-08 DIAGNOSIS — Z45018 Encounter for adjustment and management of other part of cardiac pacemaker: Secondary | ICD-10-CM | POA: Diagnosis not present

## 2020-12-08 DIAGNOSIS — Z950 Presence of cardiac pacemaker: Secondary | ICD-10-CM | POA: Diagnosis not present

## 2020-12-08 DIAGNOSIS — I5042 Chronic combined systolic (congestive) and diastolic (congestive) heart failure: Secondary | ICD-10-CM | POA: Diagnosis not present

## 2020-12-12 ENCOUNTER — Other Ambulatory Visit: Payer: Self-pay | Admitting: Cardiology

## 2020-12-17 DIAGNOSIS — I10 Essential (primary) hypertension: Secondary | ICD-10-CM | POA: Diagnosis not present

## 2020-12-28 ENCOUNTER — Ambulatory Visit: Payer: Medicare Other | Admitting: Cardiology

## 2020-12-28 NOTE — Progress Notes (Signed)
Rescheduled

## 2021-01-07 ENCOUNTER — Ambulatory Visit: Payer: Medicare Other | Admitting: Cardiology

## 2021-01-07 NOTE — Progress Notes (Signed)
No show

## 2021-01-08 DIAGNOSIS — Z4502 Encounter for adjustment and management of automatic implantable cardiac defibrillator: Secondary | ICD-10-CM | POA: Diagnosis not present

## 2021-01-08 DIAGNOSIS — I5042 Chronic combined systolic (congestive) and diastolic (congestive) heart failure: Secondary | ICD-10-CM | POA: Diagnosis not present

## 2021-01-08 DIAGNOSIS — Z95810 Presence of automatic (implantable) cardiac defibrillator: Secondary | ICD-10-CM | POA: Diagnosis not present

## 2021-01-17 DIAGNOSIS — I10 Essential (primary) hypertension: Secondary | ICD-10-CM | POA: Diagnosis not present

## 2021-02-13 NOTE — Progress Notes (Signed)
No show

## 2021-02-14 ENCOUNTER — Ambulatory Visit: Payer: Medicare Other | Admitting: Cardiology

## 2021-02-14 DIAGNOSIS — I10 Essential (primary) hypertension: Secondary | ICD-10-CM

## 2021-02-14 DIAGNOSIS — I480 Paroxysmal atrial fibrillation: Secondary | ICD-10-CM

## 2021-02-14 DIAGNOSIS — I5022 Chronic systolic (congestive) heart failure: Secondary | ICD-10-CM

## 2021-02-14 DIAGNOSIS — N1832 Chronic kidney disease, stage 3b: Secondary | ICD-10-CM

## 2021-02-14 DIAGNOSIS — Z950 Presence of cardiac pacemaker: Secondary | ICD-10-CM

## 2021-02-15 ENCOUNTER — Other Ambulatory Visit: Payer: Self-pay | Admitting: Cardiology

## 2021-02-15 DIAGNOSIS — I5022 Chronic systolic (congestive) heart failure: Secondary | ICD-10-CM

## 2021-02-16 DIAGNOSIS — I10 Essential (primary) hypertension: Secondary | ICD-10-CM | POA: Diagnosis not present

## 2021-03-10 DIAGNOSIS — I5042 Chronic combined systolic (congestive) and diastolic (congestive) heart failure: Secondary | ICD-10-CM | POA: Diagnosis not present

## 2021-03-10 DIAGNOSIS — Z95810 Presence of automatic (implantable) cardiac defibrillator: Secondary | ICD-10-CM | POA: Diagnosis not present

## 2021-03-10 DIAGNOSIS — Z4502 Encounter for adjustment and management of automatic implantable cardiac defibrillator: Secondary | ICD-10-CM | POA: Diagnosis not present

## 2021-03-18 DIAGNOSIS — I10 Essential (primary) hypertension: Secondary | ICD-10-CM | POA: Diagnosis not present

## 2021-04-10 ENCOUNTER — Other Ambulatory Visit: Payer: Self-pay | Admitting: Cardiology

## 2021-04-10 ENCOUNTER — Other Ambulatory Visit: Payer: Self-pay | Admitting: *Deleted

## 2021-04-10 DIAGNOSIS — Z170 Estrogen receptor positive status [ER+]: Secondary | ICD-10-CM

## 2021-04-10 DIAGNOSIS — I5042 Chronic combined systolic (congestive) and diastolic (congestive) heart failure: Secondary | ICD-10-CM | POA: Diagnosis not present

## 2021-04-10 DIAGNOSIS — Z4502 Encounter for adjustment and management of automatic implantable cardiac defibrillator: Secondary | ICD-10-CM | POA: Diagnosis not present

## 2021-04-10 DIAGNOSIS — Z95810 Presence of automatic (implantable) cardiac defibrillator: Secondary | ICD-10-CM | POA: Diagnosis not present
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
## 2021-04-11 ENCOUNTER — Encounter: Payer: Self-pay | Admitting: Oncology

## 2021-04-11 ENCOUNTER — Inpatient Hospital Stay: Payer: Medicare Other | Attending: Oncology | Admitting: Oncology

## 2021-04-11 ENCOUNTER — Inpatient Hospital Stay: Payer: Medicare Other

## 2021-04-11 DIAGNOSIS — Z170 Estrogen receptor positive status [ER+]: Secondary | ICD-10-CM

## 2021-04-11 DIAGNOSIS — C50412 Malignant neoplasm of upper-outer quadrant of left female breast: Secondary | ICD-10-CM

## 2021-04-17 DIAGNOSIS — I10 Essential (primary) hypertension: Secondary | ICD-10-CM | POA: Diagnosis not present

## 2021-04-25 ENCOUNTER — Other Ambulatory Visit: Payer: Self-pay | Admitting: Cardiology

## 2021-04-25 DIAGNOSIS — I480 Paroxysmal atrial fibrillation: Secondary | ICD-10-CM

## 2021-05-10 DIAGNOSIS — I5042 Chronic combined systolic (congestive) and diastolic (congestive) heart failure: Secondary | ICD-10-CM | POA: Diagnosis not present

## 2021-05-10 DIAGNOSIS — Z950 Presence of cardiac pacemaker: Secondary | ICD-10-CM | POA: Diagnosis not present

## 2021-05-10 DIAGNOSIS — Z45018 Encounter for adjustment and management of other part of cardiac pacemaker: Secondary | ICD-10-CM | POA: Diagnosis not present

## 2021-05-16 DIAGNOSIS — I10 Essential (primary) hypertension: Secondary | ICD-10-CM | POA: Diagnosis not present

## 2021-05-30 ENCOUNTER — Other Ambulatory Visit: Payer: Self-pay | Admitting: Cardiology

## 2021-05-30 DIAGNOSIS — I5022 Chronic systolic (congestive) heart failure: Secondary | ICD-10-CM

## 2021-06-14 ENCOUNTER — Other Ambulatory Visit: Payer: Self-pay | Admitting: Cardiology

## 2021-06-14 DIAGNOSIS — I5022 Chronic systolic (congestive) heart failure: Secondary | ICD-10-CM

## 2021-06-17 ENCOUNTER — Other Ambulatory Visit: Payer: Self-pay | Admitting: Cardiology

## 2021-06-28 ENCOUNTER — Other Ambulatory Visit: Payer: Self-pay | Admitting: Cardiology

## 2021-07-24 ENCOUNTER — Other Ambulatory Visit (HOSPITAL_COMMUNITY): Payer: Self-pay | Admitting: Cardiology

## 2021-07-24 ENCOUNTER — Other Ambulatory Visit: Payer: Self-pay

## 2021-07-24 ENCOUNTER — Ambulatory Visit: Payer: Medicare Other | Admitting: Cardiology

## 2021-07-24 ENCOUNTER — Encounter: Payer: Self-pay | Admitting: Cardiology

## 2021-07-24 VITALS — BP 147/69 | HR 68 | Temp 98.0°F | Resp 16 | Ht 63.0 in | Wt 181.0 lb

## 2021-07-24 DIAGNOSIS — I10 Essential (primary) hypertension: Secondary | ICD-10-CM | POA: Diagnosis not present

## 2021-07-24 DIAGNOSIS — I480 Paroxysmal atrial fibrillation: Secondary | ICD-10-CM

## 2021-07-24 DIAGNOSIS — E876 Hypokalemia: Secondary | ICD-10-CM

## 2021-07-24 DIAGNOSIS — Z950 Presence of cardiac pacemaker: Secondary | ICD-10-CM | POA: Diagnosis not present

## 2021-07-24 DIAGNOSIS — I5022 Chronic systolic (congestive) heart failure: Secondary | ICD-10-CM | POA: Diagnosis not present

## 2021-07-24 NOTE — Progress Notes (Signed)
Follow up visit  Subjective:   Erin Novak, female    DOB: 05-02-1941, 80 y.o.   MRN: [id number]    Chief Complaint  Patient presents with   Chronic systolic heart failure    Paroxysmal atrial fibrillation    Follow-up   80 y/o Caucasian female w/NICM, severe mitral and tricuspid regurgitation, hypertension, paroxysmal Afib, hyperlipidemia, h/o breast cancer s/p radiation therapy 2018, now s/p CRT-P placement 10/2018  Patient is doing well without any complaints of chest pain or shortness of breath. Home BP 120/69  63 bpm. Patient is soon running out of eliquis.    Current Outpatient Medications on File Prior to Visit  Medication Sig Dispense Refill   amLODipine (NORVASC) 5 MG tablet Take 1 tablet by mouth once daily 90 tablet 0   anastrozole (ARIMIDEX) 1 MG tablet Take 1 tablet (1 mg total) by mouth daily. 90 tablet 4   atorvastatin (LIPITOR) 10 MG tablet Take 1 tablet by mouth once daily 90 tablet 3   ELIQUIS 5 MG TABS tablet Take 1 tablet by mouth twice daily 180 tablet 0   furosemide (LASIX) 40 MG tablet Take 1 tablet by mouth once daily 90 tablet 0   levothyroxine (SYNTHROID, LEVOTHROID) 88 MCG tablet Take 88 mcg by mouth every morning.      loratadine (CLARITIN) 10 MG tablet Take 10 mg by mouth daily.     metoprolol succinate (TOPROL-XL) 100 MG 24 hr tablet TAKE 1 TABLET BY MOUTH ONCE DAILY WITH MEALS OR IMMEDIATELY FOLLOWING 90 tablet 2   sacubitril-valsartan (ENTRESTO) 97-103 MG Take 1 tablet by mouth 2 (two) times daily. 180 tablet 3   No current facility-administered medications on file prior to visit.    Cardiovascular studies:  EKG 07/24/2021: Sinus rhythm 68 bpm First degree AV block BiV paced rhythm  Remote BiV pacemaker transmission 04/01/2021: Longevity AP 60%, VP 96%.  There were no high ventricular rate episodes, brief AT episodes.  Impedance monitoring does not reveal fluid overload state.  Normal CRT-P function.  Scheduled Remote pacemaker check  06/10/2020:  There were 2 atrial high rate episodes detected. The longest lasted 8 seconds in duration. Review of EGM demonstrates paroxysmal atrial tachycardia. There was a <1 % cumulative atrial arrhythmia burden. There were 0 ventricular high rate episodes detected. Corvue impedance monitoring does not suggest recent fluid accumulation. Battery longevity is 7.8 years. RA pacing is 66.0 %, RV pacing is 96.0 %, and LV pacing is 96.0 %.  Scheduled Remote pacemaker check 01/30/2020:  There were 0 atrial high rate episodes detected. There were 0 ventricular high rate episodes detected. Battery longevity is 8.1 years. RA pacing is 67.0 %, RV pacing is 97.0 %, and LV pacing is 97.0 %. Health trends (patient activity & average heart rates) are stable. Corvue impedance monitoring does not suggest recent fluid accumulation.  In person pacemaker check 07/29/2019: St Jude BiV pacemaker Longevity 8 yrs LV threshold pole 3-4 increased. Based on CRT toolkit, RV-LV activation changed to pole 1-2 with good threshold. )1.0V/0.19m) BiV pacing 99%. A pacing 74%.  No alert Improved thoracic impedence.   EKG 11/28/2019: Sinus rhythm 66 bpm. Ventricular pacing.  Echocardiogram 07/29/2019: Left ventricle cavity is normal in size. Normal left ventricular wall thickness. Mildly depressed LV systolic function with visual EF 45-50%. Normal global wall motion. Doppler evidence of grade I (impaired) diastolic dysfunction, normal LAP.  Left atrial cavity is mildly dilated. Moderate (Grade II) mitral regurgitation. Mild tricuspid regurgitation. No evidence of pulmonary  hypertension. Compared to previous study on 09/25/2018, LVEF is improved. Mitral and tricuspid regurgitation less severe. Pulmonary hypertension no longer present.   EKG 04/28/2019: BiV paced rhythm.  BiV pacemaker placement 10/29/2018: 1. Successful implantation of a St. Jude BiV pacemaker for symptomatic LBBB AV block and chronic systolic heart  failure  2. No early apparent complications.   RHC/LHC 10/12/2018: Normal coronary arteries with minimal luminal irregularities No angiographically significant coronary artery disease   RA: 18 mmHg RV: 44/9 mmHg, RVEDP 21 mmHg PA: 51/25 mmHg. 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 EGFR 39. Na/K 142/3.8. Rest of the CMP normal H/H 12/38. MCV 94. Platelets 187  02/06/2020: Glucose 99, BUN/Cr 14/1.18. EGFR 44. Na/K 145/3.6.   08/08/2019: Glucose 112, BUN/Cr 17/1.24. EGFR 41. Na/K 143/3.7.  10/29/2018: H/H 17.5/55.2. MCV 92. Platelets 242 Chol 89, TG 47, HDL 31, LDL 49   Review of Systems  Review of Systems  Constitutional: Positive for weight gain.  Cardiovascular:  Negative for chest pain, dyspnea on exertion (Mild, stable), leg swelling, palpitations and syncope.        Vitals:   07/24/21 0841  BP: (!) 147/69  Pulse: 68  Resp: 16  Temp: 98 F (36.7 C)  SpO2: 96%      Objective:    Physical Exam Vitals and nursing note reviewed.  Constitutional:      General: She is not in acute distress. Neck:     Vascular: No JVD.  Cardiovascular:     Rate and Rhythm: Normal rate and regular rhythm.     Heart sounds: Normal heart sounds. No murmur heard. Pulmonary:     Effort: Pulmonary effort is normal.     Breath sounds: Normal breath sounds. No wheezing or rales.  Musculoskeletal:     Right lower leg: No edema.     Left lower leg: No edema.         Assessment & Recommendations:   80 y/o Caucasian female w/nonischemic cardiomyopathy with recovered EF, mild t mod MR, TR, hypertension, paroxysmal Afib, hyperlipidemia, h/o breast cancer s/p radiation therapy 2018, now s/p CRT-P placement 10/2018.  Nonischemic cardiomyopathy: NYHA class II.   LVEF improved to 45-50% (07/2019) with guideline directed medical therapy, including BiV pacing. Continue guideline directed medical therapy. 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 Continue metoprolol succinate 100 mg daily. Not on Spironolactone due to hyperkalemia. Check labs. Will check monitoring echocardiogram and in person pacemaker interrogation.   MR, TR: Improved in severity with CRT-P.   Paroxysmal Afib: CHA2DS2VASc score 5, annual stroke risk 7.2% Continue eliquis 5 mg bid, as long as Cr <1.5.  Hypertension: Well controlled.  Enrolled in patient monitoring for blood pressure and weight checks.  F/u in 6 months  Ryland Tungate Esther Hardy, MD [facility] Pager: [phone] Office: [phone] If no answer Cell [phone]

## 2021-07-25 LAB — BASIC METABOLIC PANEL
BUN/Creatinine Ratio: 11 — ABNORMAL LOW (ref 12–28)
BUN: 14 mg/dL (ref 8–27)
CO2: 29 mmol/L (ref 20–29)
Calcium: 9.7 mg/dL (ref 8.7–10.3)
Chloride: 98 mmol/L (ref 96–106)
Creatinine, Ser: 1.23 mg/dL — ABNORMAL HIGH (ref 0.57–1.00)
Glucose: 112 mg/dL — ABNORMAL HIGH (ref 65–99)
Potassium: 3 mmol/L — ABNORMAL LOW (ref 3.5–5.2)
Sodium: 142 mmol/L (ref 134–144)
eGFR: 44 mL/min/{1.73_m2} — ABNORMAL LOW (ref >59)

## 2021-07-25 MED ORDER — APIXABAN 5 MG PO TABS: 5.0000 mg | ORAL_TABLET | Freq: Two times a day (BID) | ORAL | 3 refills | Status: DC

## 2021-07-25 MED ORDER — POTASSIUM CHLORIDE CRYS ER 20 MEQ PO TBCR: 40.0000 meq | EXTENDED_RELEASE_TABLET | Freq: Every day | ORAL | 3 refills | Status: DC

## 2021-07-25 NOTE — Progress Notes (Signed)
Called patient, Erin Novak, Erin Novak

## 2021-07-25 NOTE — Addendum Note (Signed)
Addended by: Nigel Mormon on: 07/25/2021 09:40 AM   Modules accepted: Orders

## 2021-07-25 NOTE — Progress Notes (Signed)
Potassium is low. Recommend Potassium supplement, ordered.  Creatinine is stable. Refilled eliquis 5 mg bid.  Thanks MJP

## 2021-07-26 NOTE — Progress Notes (Signed)
Called and spoke with patient regarding her recent lab results. Patient advised to start potassium supplement, patient agreed.

## 2021-08-07 ENCOUNTER — Telehealth: Payer: Self-pay | Admitting: Cardiology

## 2021-08-07 ENCOUNTER — Other Ambulatory Visit: Payer: Self-pay

## 2021-08-07 ENCOUNTER — Other Ambulatory Visit: Payer: Medicare Other

## 2021-08-07 ENCOUNTER — Encounter: Payer: Self-pay | Admitting: Cardiology

## 2021-08-07 ENCOUNTER — Ambulatory Visit: Payer: Medicare Other | Admitting: Cardiology

## 2021-08-07 ENCOUNTER — Ambulatory Visit: Payer: Medicare Other

## 2021-08-07 ENCOUNTER — Encounter: Payer: Medicare Other | Admitting: Cardiology

## 2021-08-07 DIAGNOSIS — I5022 Chronic systolic (congestive) heart failure: Secondary | ICD-10-CM

## 2021-08-07 DIAGNOSIS — Z45018 Encounter for adjustment and management of other part of cardiac pacemaker: Secondary | ICD-10-CM

## 2021-08-07 DIAGNOSIS — Z950 Presence of cardiac pacemaker: Secondary | ICD-10-CM

## 2021-08-07 NOTE — Telephone Encounter (Signed)
Let MA's be included in this as they control the schedule

## 2021-08-07 NOTE — Telephone Encounter (Signed)
Got this message?

## 2021-08-07 NOTE — Progress Notes (Signed)
Chief Complaint  Patient presents with   Pacemaker Check    CRTP    Scheduled  In office pacemaker check 08/07/21  Single (S)/Dual (D)/BV: BV. Presenting ASBP. Pacemaker dependant:  No. Underlying NSR @ 70/min. AP 55%, BP 97%. AMS Episodes 2. Brief AT   HVR 0.  Longevity 5.5 Years. Magnet rate: >85%. Lead measurements: Stable. Thoracic impedance: Stable and baseline without fluid overload. Histogram: Low (L)/normal (N)/high (H)  Normal. Patient activity Good.   Observations: Normal CRTP function. Changes: None     ICD-10-CM   1. Pacemaker: [facility] Jude 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 - 10/29/2018 in situ  Z95.0     2. 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  Z45.018     3. Chronic systolic heart failure ([place])  I50.22         Adrian Prows, MD, [facility] 08/07/2021, 3:01 PM Office: [phone] Fax: [phone] Pager: [phone] .

## 2021-08-07 NOTE — Telephone Encounter (Addendum)
Pt's daughter Lattie Haw stating pt needs to have pacer check; pt had canceled due to another appt. Pt has echo on 10/4, Lattie Haw wants to know if pt can come in for pacer check same day (they drive about an hour one way to come here).

## 2021-08-09 NOTE — Telephone Encounter (Signed)
Yes we saw her yesterday, everything was taken care of.

## 2021-08-20 ENCOUNTER — Other Ambulatory Visit: Payer: Self-pay

## 2021-08-26 ENCOUNTER — Other Ambulatory Visit: Payer: Self-pay

## 2021-08-26 DIAGNOSIS — E876 Hypokalemia: Secondary | ICD-10-CM

## 2021-08-26 DIAGNOSIS — I480 Paroxysmal atrial fibrillation: Secondary | ICD-10-CM

## 2021-08-26 DIAGNOSIS — I5022 Chronic systolic (congestive) heart failure: Secondary | ICD-10-CM

## 2021-08-26 MED ORDER — APIXABAN 5 MG PO TABS: 5.0000 mg | ORAL_TABLET | Freq: Two times a day (BID) | ORAL | 3 refills | Status: DC

## 2021-08-26 MED ORDER — AMLODIPINE BESYLATE 5 MG PO TABS: 5.0000 mg | ORAL_TABLET | Freq: Every day | ORAL | 3 refills | Status: DC

## 2021-08-26 MED ORDER — LEVOTHYROXINE SODIUM 88 MCG PO TABS: 88.0000 ug | ORAL_TABLET | ORAL | 3 refills | Status: DC

## 2021-08-26 MED ORDER — ANASTROZOLE 1 MG PO TABS: 1.0000 mg | ORAL_TABLET | Freq: Every day | ORAL | 4 refills | Status: DC

## 2021-08-26 MED ORDER — METOPROLOL SUCCINATE ER 100 MG PO TB24: ORAL_TABLET | ORAL | 3 refills | Status: DC

## 2021-08-26 MED ORDER — FUROSEMIDE 40 MG PO TABS: 40.0000 mg | ORAL_TABLET | Freq: Every day | ORAL | 3 refills | Status: DC

## 2021-08-26 MED ORDER — POTASSIUM CHLORIDE CRYS ER 20 MEQ PO TBCR
40.0000 meq | EXTENDED_RELEASE_TABLET | Freq: Every day | ORAL | 3 refills | Status: DC
Start: 2021-08-26 — End: 2022-01-20

## 2021-08-26 MED ORDER — ATORVASTATIN CALCIUM 10 MG PO TABS: 10.0000 mg | ORAL_TABLET | Freq: Every day | ORAL | 3 refills | Status: DC

## 2021-09-04 DIAGNOSIS — E039 Hypothyroidism, unspecified: Secondary | ICD-10-CM | POA: Diagnosis not present

## 2021-09-04 DIAGNOSIS — I10 Essential (primary) hypertension: Secondary | ICD-10-CM | POA: Diagnosis not present

## 2021-09-04 DIAGNOSIS — Z0000 Encounter for general adult medical examination without abnormal findings: Secondary | ICD-10-CM | POA: Diagnosis not present

## 2021-09-04 DIAGNOSIS — R413 Other amnesia: Secondary | ICD-10-CM | POA: Diagnosis not present

## 2021-09-04 DIAGNOSIS — I509 Heart failure, unspecified: Secondary | ICD-10-CM | POA: Diagnosis not present

## 2021-09-04 DIAGNOSIS — Z23 Encounter for immunization: Secondary | ICD-10-CM | POA: Diagnosis not present

## 2021-09-04 DIAGNOSIS — Z853 Personal history of malignant neoplasm of breast: Secondary | ICD-10-CM | POA: Diagnosis not present

## 2021-09-05 ENCOUNTER — Telehealth: Payer: Self-pay

## 2021-09-05 NOTE — Telephone Encounter (Signed)
done

## 2021-09-11 DIAGNOSIS — Z4502 Encounter for adjustment and management of automatic implantable cardiac defibrillator: Secondary | ICD-10-CM | POA: Diagnosis not present

## 2021-09-11 DIAGNOSIS — I5042 Chronic combined systolic (congestive) and diastolic (congestive) heart failure: Secondary | ICD-10-CM | POA: Diagnosis not present

## 2021-09-11 DIAGNOSIS — Z95810 Presence of automatic (implantable) cardiac defibrillator: Secondary | ICD-10-CM | POA: Diagnosis not present

## 2021-10-20 ENCOUNTER — Other Ambulatory Visit: Payer: Self-pay | Admitting: Cardiology

## 2021-10-28 ENCOUNTER — Encounter: Payer: Self-pay | Admitting: Cardiology

## 2021-10-29 NOTE — Telephone Encounter (Signed)
Do you have this?

## 2021-10-31 ENCOUNTER — Other Ambulatory Visit: Payer: Self-pay

## 2021-10-31 DIAGNOSIS — I480 Paroxysmal atrial fibrillation: Secondary | ICD-10-CM

## 2021-10-31 MED ORDER — APIXABAN 5 MG PO TABS: 5.0000 mg | ORAL_TABLET | Freq: Two times a day (BID) | ORAL | 3 refills | Status: DC

## 2021-10-31 MED ORDER — SACUBITRIL-VALSARTAN 97-103 MG PO TABS: 1.0000 | ORAL_TABLET | Freq: Two times a day (BID) | ORAL | 3 refills | Status: DC

## 2021-11-07 DIAGNOSIS — Z45018 Encounter for adjustment and management of other part of cardiac pacemaker: Secondary | ICD-10-CM | POA: Diagnosis not present

## 2021-11-07 DIAGNOSIS — I5042 Chronic combined systolic (congestive) and diastolic (congestive) heart failure: Secondary | ICD-10-CM | POA: Diagnosis not present

## 2021-11-12 DIAGNOSIS — Z4502 Encounter for adjustment and management of automatic implantable cardiac defibrillator: Secondary | ICD-10-CM | POA: Diagnosis not present

## 2021-11-12 DIAGNOSIS — Z95810 Presence of automatic (implantable) cardiac defibrillator: Secondary | ICD-10-CM | POA: Diagnosis not present

## 2021-11-12 DIAGNOSIS — I5042 Chronic combined systolic (congestive) and diastolic (congestive) heart failure: Secondary | ICD-10-CM | POA: Diagnosis not present

## 2021-11-16 ENCOUNTER — Encounter: Payer: Self-pay | Admitting: Cardiology

## 2021-12-13 DIAGNOSIS — Z4502 Encounter for adjustment and management of automatic implantable cardiac defibrillator: Secondary | ICD-10-CM | POA: Diagnosis not present

## 2021-12-13 DIAGNOSIS — I5042 Chronic combined systolic (congestive) and diastolic (congestive) heart failure: Secondary | ICD-10-CM | POA: Diagnosis not present

## 2021-12-13 DIAGNOSIS — Z95810 Presence of automatic (implantable) cardiac defibrillator: Secondary | ICD-10-CM | POA: Diagnosis not present

## 2021-12-25 DIAGNOSIS — R739 Hyperglycemia, unspecified: Secondary | ICD-10-CM | POA: Diagnosis not present

## 2021-12-25 DIAGNOSIS — E7800 Pure hypercholesterolemia, unspecified: Secondary | ICD-10-CM | POA: Diagnosis not present

## 2021-12-25 DIAGNOSIS — I10 Essential (primary) hypertension: Secondary | ICD-10-CM | POA: Diagnosis not present

## 2021-12-25 DIAGNOSIS — E039 Hypothyroidism, unspecified: Secondary | ICD-10-CM | POA: Diagnosis not present

## 2022-01-13 DIAGNOSIS — I5042 Chronic combined systolic (congestive) and diastolic (congestive) heart failure: Secondary | ICD-10-CM | POA: Diagnosis not present

## 2022-01-13 DIAGNOSIS — Z95810 Presence of automatic (implantable) cardiac defibrillator: Secondary | ICD-10-CM | POA: Diagnosis not present

## 2022-01-13 DIAGNOSIS — Z4502 Encounter for adjustment and management of automatic implantable cardiac defibrillator: Secondary | ICD-10-CM | POA: Diagnosis not present

## 2022-01-15 ENCOUNTER — Telehealth: Payer: Self-pay | Admitting: Cardiology

## 2022-01-15 NOTE — Telephone Encounter (Signed)
Called and spoke with patient daughter regarding patients' pacemaker results.

## 2022-01-20 ENCOUNTER — Other Ambulatory Visit: Payer: Self-pay

## 2022-01-20 ENCOUNTER — Encounter: Payer: Self-pay | Admitting: Cardiology

## 2022-01-20 ENCOUNTER — Ambulatory Visit: Payer: Medicare Other | Admitting: Cardiology

## 2022-01-20 VITALS — BP 134/69 | HR 79 | Temp 97.7°F | Resp 17 | Ht 63.0 in | Wt 185.4 lb

## 2022-01-20 DIAGNOSIS — I480 Paroxysmal atrial fibrillation: Secondary | ICD-10-CM

## 2022-01-20 DIAGNOSIS — I10 Essential (primary) hypertension: Secondary | ICD-10-CM

## 2022-01-20 DIAGNOSIS — I5023 Acute on chronic systolic (congestive) heart failure: Secondary | ICD-10-CM

## 2022-01-20 MED ORDER — SPIRONOLACTONE 25 MG PO TABS: 25.0000 mg | ORAL_TABLET | Freq: Every day | ORAL | 3 refills | Status: DC

## 2022-01-20 NOTE — Progress Notes (Signed)
? ?Follow up visit ? ?Subjective:  ? ?Erin Novak, female    DOB: Aug 11, 1941, 81 y.o.   MRN: [id number] ? ? ? ?Chief Complaint  ?Patient presents with  ? HFrEF  ? Follow-up  ?  37 MONTH  ? ?81 y/o Caucasian female w/NICM, severe mitral and tricuspid regurgitation, hypertension, paroxysmal Afib, hyperlipidemia, h/o breast cancer s/p radiation therapy 2018, now s/p CRT-P placement 10/2018 ? ?Patient is here with her daughter today. She is doing well without any complaints of chest pain or shortness of breath.  Recently, her pacemaker interrogation showed signs of fluid overload, although patient denies any symptoms of dyspnea, orthopnea, PND, leg edema. ? ?Patient is having difficulty with cost of Eliquis.  ? ? ?Current Outpatient Medications on File Prior to Visit  ?Medication Sig Dispense Refill  ? amLODipine (NORVASC) 5 MG tablet Take 1 tablet by mouth once daily 90 tablet 0  ? anastrozole (ARIMIDEX) 1 MG tablet Take 1 tablet (1 mg total) by mouth daily. 90 tablet 4  ? apixaban (ELIQUIS) 5 MG TABS tablet Take 1 tablet (5 mg total) by mouth 2 (two) times daily. 180 tablet 3  ? atorvastatin (LIPITOR) 10 MG tablet Take 1 tablet by mouth once daily 90 tablet 0  ? furosemide (LASIX) 40 MG tablet Take 1 tablet (40 mg total) by mouth daily. 90 tablet 3  ? levothyroxine (SYNTHROID) 88 MCG tablet Take 1 tablet (88 mcg total) by mouth every morning. 90 tablet 3  ? loratadine (CLARITIN) 10 MG tablet Take 10 mg by mouth daily.    ? metoprolol succinate (TOPROL-XL) 100 MG 24 hr tablet TAKE 1 TABLET BY MOUTH ONCE DAILY WITH MEALS OR IMMEDIATELY FOLLOWING 90 tablet 3  ? potassium chloride SA (KLOR-CON) 20 MEQ tablet Take 2 tablets (40 mEq total) by mouth daily. 120 tablet 3  ? sacubitril-valsartan (ENTRESTO) 97-103 MG Take 1 tablet by mouth 2 (two) times daily. 180 tablet 3  ? ?No current facility-administered medications on file prior to visit.  ? ? ?Cardiovascular studies: ? ?EKG 01/20/2022: ?Sinus rhythm w/ventricular  pacing ? ?Remote CRT-P transmission 01/13/2022: ?AP 71%, CRT 99%.  Longevity 5 years and 2 months.  Lead impedance and thresholds within normal limits.  There were no significant arrhythmias, brief atrial tachycardia noted.  AT/AF burden <0.1%.  Thoracic impedance suggest volume overload state ongoing since middle of January 2023. ? ?Scheduled  In office pacemaker check 09/21/22022: ?Single (S)/Dual (D)/BV: BV. ?Presenting ASBP. ?Pacemaker dependant:  No. Underlying NSR @ 70/min. AP 55%, BP 97%. ?AMS Episodes 2. Brief AT   ?HVR 0.  ?Longevity 5.5 Years. Magnet rate: >85%. ?Lead measurements: Stable. ?Thoracic impedance: Stable and baseline without fluid overload. ?Histogram: Low (L)/normal (N)/high (H)  Normal. Patient activity Good.  ? ?Observations: Normal CRTP function. Changes: None ? ?Echocardiogram 08/07/2021:  ?Left ventricle cavity is normal in size. Moderate concentric hypertrophy  ?of the left ventricle. Normal global wall motion. Normal LV systolic  ?function with visual EF 55-60%. Doppler evidence of grade I (impaired)  ?diastolic dysfunction, normal LAP.  ?Moderate (Grade II) mitral regurgitation.  ?Mild tricuspid regurgitation. Estimated pulmonary artery systolic pressure  ?32 mmHg.  ?Compared to previous study in 2020, LVEF has improved from 45-50%.  ? ?RHC/LHC 10/12/2018: ?Normal coronary arteries with minimal luminal irregularities ?No angiographically significant coronary artery disease ?  ?RA: 18 mmHg ?RV: 44/9 mmHg, RVEDP 21 mmHg ?PA: 51/25 mmHg. Mean PA 33 mmHg ?PCWP: 20 mmHg ?LV: 108/15 mmHg, LVEDP 23 mmHg ?  ?CO:  4.7 L/min ?CI: 2.6 L/min/m2 ?  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  ? ?Recent labs: ?07/24/2021: ?Glucose 112, BUN/Cr 14/1.23. EGFR 44. Na/K 142/3.0. ? ?03/26/2020: ?Glucose 88, BUN/Cr 14/1.28. EGFR 39. Na/K 142/3.8. Rest of the CMP normal ?H/H 12/38. MCV 94. Platelets 187 ? ?02/06/2020: ?Glucose 99, BUN/Cr 14/1.18.  EGFR 44. Na/K 145/3.6.  ? ?08/08/2019: ?Glucose 112, BUN/Cr 17/1.24. EGFR 41. Na/K 143/3.7. ? ?10/29/2018: ?H/H 17.5/55.2. MCV 92. Platelets 242 ?Chol 89, TG 47, HDL 31, LDL 49 ? ? ?Review of Systems  ?Review of Systems  ?Constitutional: Negative for weight gain.  ?Cardiovascular:  Negative for chest pain, dyspnea on exertion (Mild, stable), leg swelling, palpitations and syncope.  ? ? ?   ? ?Vitals:  ? 01/20/22 1014 01/20/22 1021  ?BP: (!) 141/72 134/69  ?Pulse: 82 79  ?Resp: 17   ?Temp: 97.7 ?F (36.5 ?C)   ?SpO2: 97% 97%  ? ? ? ? ?Objective:  ?  ?Physical Exam ?Vitals and nursing note reviewed.  ?Constitutional:   ?   General: She is not in acute distress. ?Neck:  ?   Vascular: No JVD.  ?Cardiovascular:  ?   Rate and Rhythm: Normal rate and regular rhythm.  ?   Heart sounds: Normal heart sounds. No murmur heard. ?Pulmonary:  ?   Effort: Pulmonary effort is normal.  ?   Breath sounds: Normal breath sounds. No wheezing or rales.  ?Musculoskeletal:  ?   Right lower leg: No edema.  ?   Left lower leg: No edema.  ?  ? ? ?   ?Assessment & Recommendations:  ? ?81 y/o Caucasian female w/nonischemic cardiomyopathy with recovered EF, mild t mod MR, TR, hypertension, paroxysmal Afib, hyperlipidemia, h/o breast cancer s/p radiation therapy 2018, now s/p CRT-P placement 10/2018. ? ?Nonischemic cardiomyopathy: ?NYHA class II.  LVEF improved to 45-50% (07/2019) with guideline directed medical therapy, including BiV pacing. ?Continue guideline directed medical therapy. ?Continue Entresto to 97-103 mg bid (tolerating well in spite CKD stage 3b). ?Continue metoprolol succinate 100 mg daily. ?Mild fluid overload seen on pacemaker interrogation.  Resume spironolactone at 25 mg daily and repeat BMP at 1 week. ?  ?MR, TR: ?Improved in severity with CRT-P. ?  ?Paroxysmal Afib: ?Only episodes of A-fib seen during her acute systolic heart failure and severe MR TR in 2019.  No episodes of A-fib seen since CRT-P.  Patient is having difficulty  with cost of Eliquis.  Given her advanced age, bleeding risk is not insignificant.  Given no documented recurrence of A-fib since 2019, both patient, her daughter, and I have mutually decided to discontinue anticoagulation. ? ?Hypertension: ?Well controlled. ? ?Enrolled in patient monitoring for blood pressure and weight checks. ?F/u in 4 weeks with Manuela Schwartz.  If consistently increase in weight and blood pressure, could consider increasing spironolactone to 50 mg and repeat BMP again at that time. ? ?F/u w/me  in 3 months ? ?Nigel Mormon, MD ?[facility] ?Pager: [phone] ?Office: [phone] ?If no answer Cell [phone] ?  ? ?   ?

## 2022-01-29 ENCOUNTER — Other Ambulatory Visit: Payer: Self-pay | Admitting: Cardiology

## 2022-01-29 DIAGNOSIS — E876 Hypokalemia: Secondary | ICD-10-CM

## 2022-01-31 DIAGNOSIS — I5022 Chronic systolic (congestive) heart failure: Secondary | ICD-10-CM | POA: Diagnosis not present

## 2022-02-01 LAB — BASIC METABOLIC PANEL
BUN/Creatinine Ratio: 14 (ref 12–28)
BUN: 17 mg/dL (ref 8–27)
CO2: 26 mmol/L (ref 20–29)
Calcium: 9.2 mg/dL (ref 8.7–10.3)
Chloride: 99 mmol/L (ref 96–106)
Creatinine, Ser: 1.24 mg/dL — ABNORMAL HIGH (ref 0.57–1.00)
Glucose: 108 mg/dL — ABNORMAL HIGH (ref 70–99)
Potassium: 3.7 mmol/L (ref 3.5–5.2)
Sodium: 141 mmol/L (ref 134–144)
eGFR: 43 mL/min/{1.73_m2} — ABNORMAL LOW (ref >59)

## 2022-02-01 LAB — PRO B NATRIURETIC PEPTIDE: NT-Pro BNP: 98 pg/mL (ref 0–738)

## 2022-02-06 DIAGNOSIS — Z45018 Encounter for adjustment and management of other part of cardiac pacemaker: Secondary | ICD-10-CM | POA: Diagnosis not present

## 2022-02-06 DIAGNOSIS — I5042 Chronic combined systolic (congestive) and diastolic (congestive) heart failure: Secondary | ICD-10-CM | POA: Diagnosis not present

## 2022-02-17 ENCOUNTER — Ambulatory Visit: Payer: Medicare Other | Admitting: Pharmacist

## 2022-02-17 VITALS — BP 145/80 | HR 62 | Ht 63.0 in | Wt 185.0 lb

## 2022-02-17 DIAGNOSIS — I5023 Acute on chronic systolic (congestive) heart failure: Secondary | ICD-10-CM | POA: Diagnosis not present

## 2022-02-17 MED ORDER — SPIRONOLACTONE 50 MG PO TABS: 50.0000 mg | ORAL_TABLET | Freq: Every day | ORAL | 3 refills | Status: DC

## 2022-02-17 NOTE — Progress Notes (Signed)
? ?Follow up visit ? ?Subjective:  ? ?Erin Novak, female    DOB: [birth year], 81 y.o.   MRN: [id number] ? ? ? ?Chief Complaint  ?Patient presents with  ? Follow-up  ?  HTN management  ? ?81 y/o Caucasian female w/NICM, severe mitral and tricuspid regurgitation, hypertension, paroxysmal Afib, hyperlipidemia, h/o breast cancer s/p radiation therapy 2018, now s/p CRT-P placement 10/2018 ? ?Patient is here with her daughter today. She is doing well without any complaints of chest pain or shortness of breath.  Recently, her pacemaker interrogation showed signs of fluid overload, although patient denies any symptoms of dyspnea, orthopnea, PND, leg edema.  ? ?Spironolactone 25 mg started at last OV and pt reports to be tolerating it well without significant ADRs. Pt hasnt been complaint with her remote home BP checks. BP average over the past month 138/79 HR: 72. Per pt's daughter, pt's dementia history contributing to pt's negative affect.  ? ? ?Current Outpatient Medications on File Prior to Visit  ?Medication Sig Dispense Refill  ? amLODipine (NORVASC) 5 MG tablet Take 1 tablet by mouth once daily 90 tablet 0  ? anastrozole (ARIMIDEX) 1 MG tablet Take 1 tablet (1 mg total) by mouth daily. 90 tablet 4  ? atorvastatin (LIPITOR) 10 MG tablet Take 1 tablet by mouth once daily 90 tablet 0  ? furosemide (LASIX) 40 MG tablet Take 1 tablet (40 mg total) by mouth daily. 90 tablet 3  ? levothyroxine (SYNTHROID) 88 MCG tablet Take 1 tablet (88 mcg total) by mouth every morning. 90 tablet 3  ? loratadine (CLARITIN) 10 MG tablet Take 10 mg by mouth daily.    ? memantine (NAMENDA TITRATION PACK) tablet pack Take 1 tablet by mouth See admin instructions. (Patient not taking: Reported on 01/20/2022)    ? metoprolol succinate (TOPROL-XL) 100 MG 24 hr tablet TAKE 1 TABLET BY MOUTH ONCE DAILY WITH MEALS OR IMMEDIATELY FOLLOWING 90 tablet 3  ? sacubitril-valsartan (ENTRESTO) 97-103 MG Take 1 tablet by mouth 2 (two) times daily. 180  tablet 3  ? ?No current facility-administered medications on file prior to visit.  ? ? ?Cardiovascular studies: ? ?EKG 01/20/2022: ?Sinus rhythm w/ventricular pacing ? ?Remote CRT-P transmission 01/13/2022: ?AP 71%, CRT 99%.  Longevity 5 years and 2 months.  Lead impedance and thresholds within normal limits.  There were no significant arrhythmias, brief atrial tachycardia noted.  AT/AF burden <0.1%.  Thoracic impedance suggest volume overload state ongoing since middle of January 2023. ? ?Scheduled  In office pacemaker check 09/21/22022: ?Single (S)/Dual (D)/BV: BV. ?Presenting ASBP. ?Pacemaker dependant:  No. Underlying NSR @ 70/min. AP 55%, BP 97%. ?AMS Episodes 2. Brief AT   ?HVR 0.  ?Longevity 5.5 Years. Magnet rate: >85%. ?Lead measurements: Stable. ?Thoracic impedance: Stable and baseline without fluid overload. ?Histogram: Low (L)/normal (N)/high (H)  Normal. Patient activity Good.  ? ?Observations: Normal CRTP function. Changes: None ? ?Echocardiogram 08/07/2021:  ?Left ventricle cavity is normal in size. Moderate concentric hypertrophy  ?of the left ventricle. Normal global wall motion. Normal LV systolic  ?function with visual EF 55-60%. Doppler evidence of grade I (impaired)  ?diastolic dysfunction, normal LAP.  ?Moderate (Grade II) mitral regurgitation.  ?Mild tricuspid regurgitation. Estimated pulmonary artery systolic pressure  ?32 mmHg.  ?Compared to previous study in 2020, LVEF has improved from 45-50%.  ? ?RHC/LHC 10/12/2018: ?Normal coronary arteries with minimal luminal irregularities ?No angiographically significant coronary artery disease ?  ?RA: 18 mmHg ?RV: 44/9 mmHg, RVEDP 21 mmHg ?PA:  51/25 mmHg. Mean PA 33 mmHg ?PCWP: 20 mmHg ?LV: 108/15 mmHg, LVEDP 23 mmHg ?  ?CO: 4.7 L/min ?CI: 2.6 L/min/m2 ?  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  ? ?Recent labs: ?01/31/2022: ?Glucose 141, BUN/Cr 17/1.24. EGFR 43. Na/K 141/3.7. NT-Pro BNP:  98. ? ?12/25/21  ?A1c: 6.0 ?TC: 148, TG: 93, HDL: 53, LDL: 76 ?TSH: 3.28 ?Hgb/HCT: 13.3/41.7 ?Glucose 141, BUN/Cr 17/1.24. EGFR 43. Na/K 141/4.7. AST/ALT: 17/20 ? ?07/24/2021: ?Glucose 104, BUN/Cr 18/1.24. EGFR 40. Na/K 140/3.0. ? ?03/26/2020: ?Glucose 88, BUN/Cr 14/1.28. EGFR 39. Na/K 142/3.8. Rest of the CMP normal ?H/H 12/38. MCV 94. Platelets 187 ? ?02/06/2020: ?Glucose 99, BUN/Cr 14/1.18. EGFR 44. Na/K 145/3.6.  ? ?08/08/2019: ?Glucose 112, BUN/Cr 17/1.24. EGFR 41. Na/K 143/3.7. ? ?10/29/2018: ?H/H 17.5/55.2. MCV 92. Platelets 242 ?Chol 89, TG 47, HDL 31, LDL 49 ? ? ?Review of Systems  ?Review of Systems  ?Cardiovascular:  Negative for chest pain, dyspnea on exertion (Mild, stable), leg swelling, palpitations and syncope.  ?Psychiatric/Behavioral:  Positive for memory loss.   ? ? ?   ? ?Vitals:  ? 02/17/22 1009  ?BP: (!) 145/80  ?Pulse: 62  ? ? ? ? ?Objective:  ?  ?Physical Exam ?Vitals and nursing note reviewed.  ?Constitutional:   ?   General: She is not in acute distress. ?Neck:  ?   Vascular: No JVD.  ?Cardiovascular:  ?   Rate and Rhythm: Normal rate and regular rhythm.  ?   Heart sounds: Normal heart sounds. No murmur heard. ?Pulmonary:  ?   Effort: Pulmonary effort is normal.  ?   Breath sounds: Normal breath sounds. No wheezing or rales.  ?Musculoskeletal:  ?   Right lower leg: No edema.  ?   Left lower leg: No edema.  ?  ? ? ?   ?Assessment & Recommendations:  ? ?81 y/o Caucasian female w/nonischemic cardiomyopathy with recovered EF, mild t mod MR, TR, hypertension, paroxysmal Afib, hyperlipidemia, h/o breast cancer s/p radiation therapy 2018, now s/p CRT-P placement 10/2018. ? ?Nonischemic cardiomyopathy: ?NYHA class II.  LVEF improved to 45-50% (07/2019) with guideline directed medical therapy, including BiV pacing. ?Continue guideline directed medical therapy. ?Continue Entresto to 97-103 mg bid (tolerating well in spite CKD stage 3b). ?Continue metoprolol succinate 100 mg daily. ?Mild fluid overload seen  on pacemaker interrogation.  Renal function and electrolytes stable since starting spironolactone 25 mg. No significant changes in pt's weight since. Discussed salt intake limit of <2000 mg/day. Pt currently doesn't participate in any scheduled physical activity. Pt agreed to a goal of walking ~15 mins daily moving forward.  ?  ?MR, TR: ?Improved in severity with CRT-P. ?  ?Paroxysmal Afib: ?Only episodes of A-fib seen during her acute systolic heart failure and severe MR TR in 2019.  No episodes of A-fib seen since CRT-P.  Patient is having difficulty with cost of Eliquis.  Given her advanced age, bleeding risk is not insignificant.  Given no documented recurrence of A-fib since 2019, both patient, her daughter, and I have mutually decided to discontinue anticoagulation. ? ?Hypertension: ?Elevated and not at goal. Goal SBP <130/80. Increase spironolactone dose from 25 mg to 50 mg for addition HTN management. Lab f/u in 1 week.  ? ?F/u w/ Dr. Virgina Jock  in 2 months ? ?Alysia Penna, PharmD ?[facility] ?Office: [phone], Ext: [phone]  ?  ? ?   ?

## 2022-02-17 NOTE — Patient Instructions (Addendum)
Increase Spironolactone from 25 mg to  50 mg daily. Goal BP of <130/80 ?Check Labs in 1 week ?Continue checking home BP readings daily ?Start walking/exercising for at least 15 mins a day  ? ?Follow up in 2 months with Dr. Virgina Jock.  ? ?Alysia Penna, PharmD ?[phone] ? ? ? ?

## 2022-02-25 ENCOUNTER — Other Ambulatory Visit (HOSPITAL_COMMUNITY): Payer: Self-pay | Admitting: Cardiology

## 2022-02-25 DIAGNOSIS — I5023 Acute on chronic systolic (congestive) heart failure: Secondary | ICD-10-CM | POA: Diagnosis not present

## 2022-02-26 LAB — BASIC METABOLIC PANEL
BUN/Creatinine Ratio: 16 (ref 12–28)
BUN: 18 mg/dL (ref 8–27)
CO2: 26 mmol/L (ref 20–29)
Calcium: 9.7 mg/dL (ref 8.7–10.3)
Chloride: 102 mmol/L (ref 96–106)
Creatinine, Ser: 1.12 mg/dL — ABNORMAL HIGH (ref 0.57–1.00)
Glucose: 94 mg/dL (ref 70–99)
Potassium: 3.8 mmol/L (ref 3.5–5.2)
Sodium: 143 mmol/L (ref 134–144)
eGFR: 49 mL/min/{1.73_m2} — ABNORMAL LOW (ref >59)

## 2022-03-11 ENCOUNTER — Other Ambulatory Visit: Payer: Self-pay | Admitting: Cardiology

## 2022-03-11 DIAGNOSIS — I5022 Chronic systolic (congestive) heart failure: Secondary | ICD-10-CM

## 2022-03-16 DIAGNOSIS — Z4502 Encounter for adjustment and management of automatic implantable cardiac defibrillator: Secondary | ICD-10-CM | POA: Diagnosis not present

## 2022-03-16 DIAGNOSIS — I5042 Chronic combined systolic (congestive) and diastolic (congestive) heart failure: Secondary | ICD-10-CM | POA: Diagnosis not present

## 2022-03-16 DIAGNOSIS — Z95810 Presence of automatic (implantable) cardiac defibrillator: Secondary | ICD-10-CM | POA: Diagnosis not present

## 2022-04-16 DIAGNOSIS — I5042 Chronic combined systolic (congestive) and diastolic (congestive) heart failure: Secondary | ICD-10-CM | POA: Diagnosis not present

## 2022-04-16 DIAGNOSIS — Z4502 Encounter for adjustment and management of automatic implantable cardiac defibrillator: Secondary | ICD-10-CM | POA: Diagnosis not present

## 2022-04-16 DIAGNOSIS — Z95810 Presence of automatic (implantable) cardiac defibrillator: Secondary | ICD-10-CM | POA: Diagnosis not present

## 2022-04-23 ENCOUNTER — Ambulatory Visit: Payer: Medicare Other | Admitting: Cardiology

## 2022-05-01 ENCOUNTER — Encounter: Payer: Self-pay | Admitting: Cardiology

## 2022-05-01 ENCOUNTER — Ambulatory Visit: Payer: Medicare Other | Admitting: Cardiology

## 2022-05-01 VITALS — BP 116/73 | HR 81 | Temp 98.0°F | Resp 17 | Ht 63.0 in | Wt 184.0 lb

## 2022-05-01 DIAGNOSIS — I5020 Unspecified systolic (congestive) heart failure: Secondary | ICD-10-CM | POA: Diagnosis not present

## 2022-05-01 DIAGNOSIS — I10 Essential (primary) hypertension: Secondary | ICD-10-CM

## 2022-05-01 NOTE — Progress Notes (Signed)
Follow up visit  Subjective:   Erin Novak, female    DOB: Jun 30, 1941, 81 y.o.   MRN: [id number]    Chief Complaint  Patient presents with   Follow-up    3 MONTH   HFrEF   81 y/o Caucasian female w/NICM, severe mitral and tricuspid regurgitation, hypertension, paroxysmal Afib, hyperlipidemia, h/o breast cancer s/p radiation therapy 2018, now s/p CRT-P placement 10/2018  Patient is here with her daughter today. Patient is doing well, denies any dyspnea, orthopnea, PND.  She is compliant with medical therapy.     Current Outpatient Medications:    amLODipine (NORVASC) 5 MG tablet, TAKE 1 TABLET BY MOUTH  DAILY, Disp: 100 tablet, Rfl: 2   anastrozole (ARIMIDEX) 1 MG tablet, Take 1 tablet (1 mg total) by mouth daily., Disp: 90 tablet, Rfl: 4   atorvastatin (LIPITOR) 10 MG tablet, TAKE 1 TABLET BY MOUTH  DAILY, Disp: 100 tablet, Rfl: 2   furosemide (LASIX) 40 MG tablet, TAKE 1 TABLET BY MOUTH  DAILY, Disp: 100 tablet, Rfl: 2   levothyroxine (SYNTHROID) 88 MCG tablet, Take 1 tablet (88 mcg total) by mouth every morning., Disp: 90 tablet, Rfl: 3   loratadine (CLARITIN) 10 MG tablet, Take 10 mg by mouth daily., Disp: , Rfl:    memantine (NAMENDA TITRATION PACK) tablet pack, Take 1 tablet by mouth See admin instructions., Disp: , Rfl:    metoprolol succinate (TOPROL-XL) 100 MG 24 hr tablet, TAKE 1 TABLET BY MOUTH ONCE DAILY WITH MEAL OR  IMMEDIATELY FOLLOWING, Disp: 100 tablet, Rfl: 2   sacubitril-valsartan (ENTRESTO) 97-103 MG, Take 1 tablet by mouth 2 (two) times daily., Disp: 180 tablet, Rfl: 3   spironolactone (ALDACTONE) 50 MG tablet, Take 1 tablet (50 mg total) by mouth daily., Disp: 90 tablet, Rfl: 3  Cardiovascular studies:  Remote CRT-P transmission 04/27/2022: AP 73%, CRT 98%.  Longevity 4 years and 11 months.   There was no atrial fibrillation, no high ventricular rate episodes thoracic impedance is at baseline and does not suggest volume overload.  EKG 01/20/2022: Sinus  rhythm w/ventricular pacing  Remote CRT-P transmission 01/13/2022: AP 71%, CRT 99%.  Longevity 5 years and 2 months.  Lead impedance and thresholds within normal limits.  There were no significant arrhythmias, brief atrial tachycardia noted.  AT/AF burden <0.1%.  Thoracic impedance suggest volume overload state ongoing since middle of January 2023.  Scheduled  In office pacemaker check 09/21/22022: Single (S)/Dual (D)/BV: BV. Presenting ASBP. Pacemaker dependant:  No. Underlying NSR @ 70/min. AP 55%, BP 97%. AMS Episodes 2. Brief AT   HVR 0.  Longevity 5.5 Years. Magnet rate: >85%. Lead measurements: Stable. Thoracic impedance: Stable and baseline without fluid overload. Histogram: Low (L)/normal (N)/high (H)  Normal. Patient activity Good.   Observations: Normal CRTP function. Changes: None  Echocardiogram 08/07/2021:  Left ventricle cavity is normal in size. Moderate concentric hypertrophy  of the left ventricle. Normal global wall motion. Normal LV systolic  function with visual EF 55-60%. Doppler evidence of grade I (impaired)  diastolic dysfunction, normal LAP.  Moderate (Grade II) mitral regurgitation.  Mild tricuspid regurgitation. Estimated pulmonary artery systolic pressure  32 mmHg.  Compared to previous study in 2020, LVEF has improved from 45-50%.   RHC/LHC 10/12/2018: Normal coronary arteries with minimal luminal irregularities No angiographically significant coronary artery disease   RA: 18 mmHg RV: 44/9 mmHg, RVEDP 21 mmHg PA: 51/25 mmHg. 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 EGFR 49. Na/K 143/3.8.    Review of Systems  Review of Systems  Constitutional: Negative for weight gain.  Cardiovascular:  Negative for chest pain, dyspnea on  exertion (Mild, stable), leg swelling, palpitations and syncope.        Vitals:   05/01/22 1411  BP: 116/73  Pulse: 81  Resp: 17  Temp: 98 F (36.7 C)  SpO2: 96%      Objective:    Physical Exam Vitals and nursing note reviewed.  Constitutional:      General: She is not in acute distress. Neck:     Vascular: No JVD.  Cardiovascular:     Rate and Rhythm: Normal rate and regular rhythm.     Heart sounds: Normal heart sounds. No murmur heard. Pulmonary:     Effort: Pulmonary effort is normal.     Breath sounds: Normal breath sounds. No wheezing or rales.  Musculoskeletal:     Right lower leg: No edema.     Left lower leg: No edema.         Assessment & Recommendations:   81 y/o Caucasian female w/nonischemic cardiomyopathy with recovered EF, mild t mod MR, TR, hypertension, paroxysmal Afib, hyperlipidemia, h/o breast cancer s/p radiation therapy 2018, now s/p CRT-P placement 10/2018.  Nonischemic cardiomyopathy: NYHA class II.  LVEF improved to 45-50% (07/2019) with guideline directed medical therapy, including BiV pacing. Continue guideline directed medical therapy. 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  No episodes of A-fib seen since CRT-P.  Patient is having difficulty with cost of Eliquis.  Given her advanced age, bleeding risk is not insignificant.  Given no documented recurrence of A-fib since 2019, both patient, her daughter, and I mutually decided to discontinue anticoagulation in 01/2022.  Hypertension: Well controlled.  F/u in 1 year  Nigel Mormon, MD [facility] Pager: [phone] Office: [phone] If no answer Cell [phone]

## 2022-05-08 DIAGNOSIS — Z45018 Encounter for adjustment and management of other part of cardiac pacemaker: Secondary | ICD-10-CM | POA: Diagnosis not present

## 2022-05-08 DIAGNOSIS — I5042 Chronic combined systolic (congestive) and diastolic (congestive) heart failure: Secondary | ICD-10-CM | POA: Diagnosis not present

## 2022-05-16 DIAGNOSIS — E7800 Pure hypercholesterolemia, unspecified: Secondary | ICD-10-CM | POA: Diagnosis not present

## 2022-05-16 DIAGNOSIS — I509 Heart failure, unspecified: Secondary | ICD-10-CM | POA: Diagnosis not present

## 2022-05-16 DIAGNOSIS — I10 Essential (primary) hypertension: Secondary | ICD-10-CM | POA: Diagnosis not present

## 2022-05-16 DIAGNOSIS — I480 Paroxysmal atrial fibrillation: Secondary | ICD-10-CM | POA: Diagnosis not present

## 2022-05-17 DIAGNOSIS — I5042 Chronic combined systolic (congestive) and diastolic (congestive) heart failure: Secondary | ICD-10-CM | POA: Diagnosis not present

## 2022-05-17 DIAGNOSIS — Z95810 Presence of automatic (implantable) cardiac defibrillator: Secondary | ICD-10-CM | POA: Diagnosis not present

## 2022-05-17 DIAGNOSIS — Z4502 Encounter for adjustment and management of automatic implantable cardiac defibrillator: Secondary | ICD-10-CM | POA: Diagnosis not present

## 2022-06-13 ENCOUNTER — Encounter: Payer: Self-pay | Admitting: Podiatrist

## 2022-06-13 ENCOUNTER — Ambulatory Visit: Payer: Medicare Other | Admitting: Podiatrist

## 2022-06-13 DIAGNOSIS — M79609 Pain in unspecified limb: Secondary | ICD-10-CM | POA: Diagnosis not present

## 2022-06-13 DIAGNOSIS — B351 Tinea unguium: Secondary | ICD-10-CM

## 2022-06-13 NOTE — Progress Notes (Signed)
Chief Complaint  Patient presents with   Nail Problem    RFC , nail trim      HPI: Patient is 81 y.o. female who presents today for elongated and thickened toenails which her painful with ambulation and difficult to trim.  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 05/04/2019   Dilated cardiomyopathy ([place]) 04/28/2019   Nonrheumatic mitral valve regurgitation 04/28/2019   LBBB (left bundle branch block) 10/30/2018   Pacemaker: [place] MP - 10/29/2018 in situ 95/07/3266   Chronic systolic heart failure ([place]) 10/29/2018   Paroxysmal atrial fibrillation ([place]) 09/24/2018   CHF (congestive heart failure) ([place]) 09/24/2018   Hypokalemia 09/24/2018   Malignant neoplasm of upper-outer quadrant of left breast in female, estrogen receptor positive ([place]) 01/28/2017   Cholelithiasis without obstruction 07/15/2011   Gallstones 07/10/2011   GERD (gastroesophageal reflux disease) 07/10/2011   Obesity 07/10/2011   Hypothyroidism 02/07/2008   HLD (hyperlipidemia) 02/07/2008   Essential hypertension 02/07/2008    Current Outpatient Medications on File Prior to Visit  Medication Sig Dispense Refill   amLODipine (NORVASC) 5 MG tablet TAKE 1 TABLET BY MOUTH  DAILY 100 tablet 2   anastrozole (ARIMIDEX) 1 MG tablet Take 1 tablet (1 mg total) by mouth daily. 90 tablet 4   atorvastatin (LIPITOR) 10 MG tablet TAKE 1 TABLET BY MOUTH  DAILY 100 tablet 2   furosemide (LASIX) 40 MG tablet TAKE 1 TABLET BY MOUTH  DAILY 100 tablet 2   levothyroxine (SYNTHROID) 88 MCG tablet Take 1 tablet (88 mcg total) by mouth every morning. 90 tablet 3   loratadine (CLARITIN) 10 MG tablet Take 10 mg by mouth daily.      memantine (NAMENDA TITRATION PACK) tablet pack Take 1 tablet by mouth See admin instructions.     metoprolol succinate (TOPROL-XL) 100 MG 24 hr tablet TAKE 1 TABLET BY MOUTH ONCE DAILY WITH MEAL OR  IMMEDIATELY FOLLOWING 100 tablet 2   sacubitril-valsartan (ENTRESTO) 97-103 MG Take 1 tablet by mouth 2 (two) times daily. 180 tablet 3   spironolactone (ALDACTONE) 50 MG tablet Take 1 tablet (50 mg total) by mouth daily. 90 tablet 3   No current facility-administered medications on file prior to visit.    Allergies  Allergen Reactions   Lisinopril Cough   Percodan [Oxycodone-Aspirin] Nausea And Vomiting and Other (See Comments)    Sweating, unable to sleep   Prednisone Other (See Comments)    Makes pt sweat, unable to sleep   Amoxicillin-Pot Clavulanate Rash    Has patient had a PCN reaction causing immediate rash, facial/tongue/throat swelling, SOB or lightheadedness with hypotension: Unknown Has patient had a PCN reaction causing severe rash involving mucus membranes or skin necrosis: Unknown Has patient had a PCN reaction that required hospitalization: Unknown Has patient had a PCN reaction occurring within the last 10 years: No If all of the above answers are "NO", then may proceed with Cephalosporin use.     Review of Systems No fevers, chills, nausea, muscle aches, no difficulty breathing, no calf pain, no chest pain or shortness of breath.   Physical Exam  GENERAL APPEARANCE: Alert, conversant. Appropriately groomed. No acute distress.   VASCULAR: Pedal pulses palpable 2/4 DP and 1/4  PT bilateral.  Capillary refill time is immediate to all digits,  Proximal to distal cooling it warm to warm.  Digital perfusion adequate.   NEUROLOGIC: sensation is intact to 5.07 monofilament at 5/5 sites bilateral.  Light touch is intact bilateral, vibratory sensation intact bilateral  MUSCULOSKELETAL: acceptable muscle strength, tone and stability bilateral.  No gross boney pedal deformities  noted.  No pain, crepitus or limitation noted with foot and ankle range of motion bilateral.   DERMATOLOGIC: skin is warm, supple, and dry.  Color, texture, and turgor of skin within normal limits.  Dried blood at the distal tip of the right third toe is seen as evidence of the patient trying to cut her toenails and nipping the skin.  Toenail on this digit is already cut back.  Her nails are elongated, thickened, fragile and mycotic x8.  Left hallux nail has a pincer type nail deformity forming.  Assessment     ICD-10-CM   1. Pain due to onychomycosis of nail  B35.1    M79.609        Plan  Debrided the patient's nails without complication with a sterile nail nipper and a power bur.  Recommended routine care every 3 months or as needed for follow-up.

## 2022-06-13 NOTE — Patient Instructions (Signed)

## 2022-06-17 DIAGNOSIS — Z95810 Presence of automatic (implantable) cardiac defibrillator: Secondary | ICD-10-CM | POA: Diagnosis not present

## 2022-06-17 DIAGNOSIS — I5042 Chronic combined systolic (congestive) and diastolic (congestive) heart failure: Secondary | ICD-10-CM | POA: Diagnosis not present

## 2022-06-17 DIAGNOSIS — Z4502 Encounter for adjustment and management of automatic implantable cardiac defibrillator: Secondary | ICD-10-CM | POA: Diagnosis not present

## 2022-07-02 ENCOUNTER — Other Ambulatory Visit: Payer: Self-pay | Admitting: Cardiology

## 2022-07-18 DIAGNOSIS — Z95810 Presence of automatic (implantable) cardiac defibrillator: Secondary | ICD-10-CM | POA: Diagnosis not present

## 2022-07-18 DIAGNOSIS — Z4502 Encounter for adjustment and management of automatic implantable cardiac defibrillator: Secondary | ICD-10-CM | POA: Diagnosis not present

## 2022-07-18 DIAGNOSIS — I5042 Chronic combined systolic (congestive) and diastolic (congestive) heart failure: Secondary | ICD-10-CM | POA: Diagnosis not present

## 2022-08-07 DIAGNOSIS — Z45018 Encounter for adjustment and management of other part of cardiac pacemaker: Secondary | ICD-10-CM | POA: Diagnosis not present

## 2022-08-07 DIAGNOSIS — I5042 Chronic combined systolic (congestive) and diastolic (congestive) heart failure: Secondary | ICD-10-CM | POA: Diagnosis not present

## 2022-08-18 DIAGNOSIS — I5042 Chronic combined systolic (congestive) and diastolic (congestive) heart failure: Secondary | ICD-10-CM | POA: Diagnosis not present

## 2022-08-18 DIAGNOSIS — Z4502 Encounter for adjustment and management of automatic implantable cardiac defibrillator: Secondary | ICD-10-CM | POA: Diagnosis not present

## 2022-08-18 DIAGNOSIS — Z95810 Presence of automatic (implantable) cardiac defibrillator: Secondary | ICD-10-CM | POA: Diagnosis not present

## 2022-09-02 ENCOUNTER — Other Ambulatory Visit: Payer: Self-pay

## 2022-09-04 DIAGNOSIS — E559 Vitamin D deficiency, unspecified: Secondary | ICD-10-CM | POA: Diagnosis not present

## 2022-09-04 DIAGNOSIS — R7309 Other abnormal glucose: Secondary | ICD-10-CM | POA: Diagnosis not present

## 2022-09-04 DIAGNOSIS — E039 Hypothyroidism, unspecified: Secondary | ICD-10-CM | POA: Diagnosis not present

## 2022-09-04 DIAGNOSIS — E7800 Pure hypercholesterolemia, unspecified: Secondary | ICD-10-CM | POA: Diagnosis not present

## 2022-09-06 ENCOUNTER — Other Ambulatory Visit: Payer: Self-pay | Admitting: Cardiology

## 2022-09-18 DIAGNOSIS — I5042 Chronic combined systolic (congestive) and diastolic (congestive) heart failure: Secondary | ICD-10-CM | POA: Diagnosis not present

## 2022-09-18 DIAGNOSIS — E559 Vitamin D deficiency, unspecified: Secondary | ICD-10-CM | POA: Diagnosis not present

## 2022-09-18 DIAGNOSIS — I480 Paroxysmal atrial fibrillation: Secondary | ICD-10-CM | POA: Diagnosis not present

## 2022-09-18 DIAGNOSIS — Z853 Personal history of malignant neoplasm of breast: Secondary | ICD-10-CM | POA: Diagnosis not present

## 2022-09-18 DIAGNOSIS — Z0000 Encounter for general adult medical examination without abnormal findings: Secondary | ICD-10-CM | POA: Diagnosis not present

## 2022-09-18 DIAGNOSIS — I129 Hypertensive chronic kidney disease with stage 1 through stage 4 chronic kidney disease, or unspecified chronic kidney disease: Secondary | ICD-10-CM | POA: Diagnosis not present

## 2022-09-18 DIAGNOSIS — Z23 Encounter for immunization: Secondary | ICD-10-CM | POA: Diagnosis not present

## 2022-09-18 DIAGNOSIS — I429 Cardiomyopathy, unspecified: Secondary | ICD-10-CM | POA: Diagnosis not present

## 2022-09-18 DIAGNOSIS — Z4502 Encounter for adjustment and management of automatic implantable cardiac defibrillator: Secondary | ICD-10-CM | POA: Diagnosis not present

## 2022-09-18 DIAGNOSIS — E039 Hypothyroidism, unspecified: Secondary | ICD-10-CM | POA: Diagnosis not present

## 2022-09-18 DIAGNOSIS — Z170 Estrogen receptor positive status [ER+]: Secondary | ICD-10-CM | POA: Diagnosis not present

## 2022-09-18 DIAGNOSIS — Z95810 Presence of automatic (implantable) cardiac defibrillator: Secondary | ICD-10-CM | POA: Diagnosis not present

## 2022-09-18 DIAGNOSIS — R7309 Other abnormal glucose: Secondary | ICD-10-CM | POA: Diagnosis not present

## 2022-09-18 DIAGNOSIS — N1832 Chronic kidney disease, stage 3b: Secondary | ICD-10-CM | POA: Diagnosis not present

## 2022-09-30 NOTE — Progress Notes (Unsigned)
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 Presenting ***. Pacemaker dependant:  ***. Underlying ***. AP ***%, VP ***%. BP ***%. AMS Episodes ***.  AT/AF burden ***% . Longest ***. Latest ***. HVR ***. Longest ***. Latest ***. Longevity *** Years. Magnet rate: >85%. Lead measurements: Stable. Thoracic impedance: ***. Histogram: Low (L)/normal (N)/high (H)  ***. Patient activity ***.   Observations: ***. Changes: ***.

## 2022-10-01 ENCOUNTER — Ambulatory Visit: Payer: Medicare Other | Admitting: Cardiology

## 2022-10-01 DIAGNOSIS — I471 Supraventricular tachycardia, unspecified: Secondary | ICD-10-CM

## 2022-10-01 DIAGNOSIS — I5020 Unspecified systolic (congestive) heart failure: Secondary | ICD-10-CM | POA: Diagnosis not present

## 2022-10-01 DIAGNOSIS — Z950 Presence of cardiac pacemaker: Secondary | ICD-10-CM

## 2022-10-01 DIAGNOSIS — Z45018 Encounter for adjustment and management of other part of cardiac pacemaker: Secondary | ICD-10-CM

## 2022-10-06 ENCOUNTER — Other Ambulatory Visit: Payer: Self-pay | Admitting: Cardiology

## 2022-10-06 DIAGNOSIS — I5023 Acute on chronic systolic (congestive) heart failure: Secondary | ICD-10-CM

## 2022-10-19 DIAGNOSIS — I5042 Chronic combined systolic (congestive) and diastolic (congestive) heart failure: Secondary | ICD-10-CM | POA: Diagnosis not present

## 2022-10-19 DIAGNOSIS — Z95810 Presence of automatic (implantable) cardiac defibrillator: Secondary | ICD-10-CM | POA: Diagnosis not present

## 2022-10-19 DIAGNOSIS — Z4502 Encounter for adjustment and management of automatic implantable cardiac defibrillator: Secondary | ICD-10-CM | POA: Diagnosis not present

## 2022-11-06 DIAGNOSIS — I5042 Chronic combined systolic (congestive) and diastolic (congestive) heart failure: Secondary | ICD-10-CM | POA: Diagnosis not present

## 2022-11-06 DIAGNOSIS — Z45018 Encounter for adjustment and management of other part of cardiac pacemaker: Secondary | ICD-10-CM | POA: Diagnosis not present

## 2022-11-19 DIAGNOSIS — Z950 Presence of cardiac pacemaker: Secondary | ICD-10-CM | POA: Diagnosis not present

## 2022-11-19 DIAGNOSIS — I5042 Chronic combined systolic (congestive) and diastolic (congestive) heart failure: Secondary | ICD-10-CM | POA: Diagnosis not present

## 2022-12-10 ENCOUNTER — Telehealth: Payer: Self-pay

## 2022-12-10 NOTE — Telephone Encounter (Signed)
Daughter called to ask the status of patient assistance. I called [employer] and was told to call back next week that they are behind that it takes 3 -5 days for faxes to get entered into the system.   Returned call and spoke with daughter Lattie Haw and informed her of my conversation with [employer] concerning status. I will have paperwork faxed again today.   I have put patients financial documents up front per daughter's request along with Samples of Entresto 49/51 with instructions to take 2 tabs bid and to call us if more samples are needed while waiting for approval.

## 2022-12-10 NOTE — Telephone Encounter (Signed)
No action done

## 2022-12-10 NOTE — Telephone Encounter (Signed)
faxed patient assistance for entresto 97/103 on 12/10/2022. LM

## 2022-12-20 DIAGNOSIS — I5042 Chronic combined systolic (congestive) and diastolic (congestive) heart failure: Secondary | ICD-10-CM | POA: Diagnosis not present

## 2022-12-20 DIAGNOSIS — Z4502 Encounter for adjustment and management of automatic implantable cardiac defibrillator: Secondary | ICD-10-CM | POA: Diagnosis not present

## 2022-12-20 DIAGNOSIS — Z95810 Presence of automatic (implantable) cardiac defibrillator: Secondary | ICD-10-CM | POA: Diagnosis not present

## 2022-12-30 ENCOUNTER — Telehealth: Payer: Self-pay

## 2022-12-30 NOTE — Telephone Encounter (Signed)
Spoke with daughter. She has received notification of approval from [employer] for [employer]. Patient is receiving shipment tomorrow.

## 2023-01-09 ENCOUNTER — Other Ambulatory Visit: Payer: Self-pay | Admitting: Cardiology

## 2023-01-09 DIAGNOSIS — I5022 Chronic systolic (congestive) heart failure: Secondary | ICD-10-CM

## 2023-01-20 DIAGNOSIS — Z95810 Presence of automatic (implantable) cardiac defibrillator: Secondary | ICD-10-CM | POA: Diagnosis not present

## 2023-01-20 DIAGNOSIS — I5042 Chronic combined systolic (congestive) and diastolic (congestive) heart failure: Secondary | ICD-10-CM | POA: Diagnosis not present

## 2023-01-20 DIAGNOSIS — Z4502 Encounter for adjustment and management of automatic implantable cardiac defibrillator: Secondary | ICD-10-CM | POA: Diagnosis not present

## 2023-01-22 ENCOUNTER — Ambulatory Visit: Payer: Medicare Other | Admitting: Cardiology

## 2023-01-22 ENCOUNTER — Encounter: Payer: Self-pay | Admitting: Cardiology

## 2023-01-22 VITALS — BP 123/73 | HR 83 | Resp 18 | Ht 63.0 in | Wt 186.8 lb

## 2023-01-22 DIAGNOSIS — I10 Essential (primary) hypertension: Secondary | ICD-10-CM

## 2023-01-22 DIAGNOSIS — I5022 Chronic systolic (congestive) heart failure: Secondary | ICD-10-CM | POA: Diagnosis not present

## 2023-01-22 NOTE — Progress Notes (Signed)
Follow up visit  Subjective:   Erin Novak, female    DOB: 11-17-1941, 82 y.o.   MRN: [id number]    Chief Complaint  Patient presents with   Congestive Heart Failure   Follow-up    82 weeks   82 y/o Caucasian female w/NICM, severe mitral and tricuspid regurgitation, hypertension, paroxysmal Afib, hyperlipidemia, h/o breast cancer s/p radiation therapy 2018, now s/p CRT-P placement 10/2018  Patient is doing well, denies any dyspnea, orthopnea, PND.  She is compliant with medical therapy. Reviewed recent test results with the patient, details below.  She has had dry cough last few days.      Current Outpatient Medications:    amLODipine (NORVASC) 5 MG tablet, TAKE 1 TABLET BY MOUTH DAILY, Disp: 100 tablet, Rfl: 2   anastrozole (ARIMIDEX) 1 MG tablet, TAKE 1 TABLET BY MOUTH  DAILY, Disp: 100 tablet, Rfl: 2   atorvastatin (LIPITOR) 10 MG tablet, TAKE 1 TABLET BY MOUTH DAILY, Disp: 100 tablet, Rfl: 2   furosemide (LASIX) 40 MG tablet, TAKE 1 TABLET BY MOUTH DAILY, Disp: 100 tablet, Rfl: 2   levothyroxine (SYNTHROID) 88 MCG tablet, TAKE 1 TABLET BY MOUTH IN THE  MORNING, Disp: 100 tablet, Rfl: 2   loratadine (CLARITIN) 10 MG tablet, Take 10 mg by mouth daily., Disp: , Rfl:    memantine (NAMENDA TITRATION PACK) tablet pack, Take 1 tablet by mouth See admin instructions., Disp: , Rfl:    metoprolol succinate (TOPROL-XL) 100 MG 24 hr tablet, TAKE 1 TABLET BY MOUTH ONCE  DAILY WITH MEAL OR IMMEDIATELY  FOLLOWING, Disp: 100 tablet, Rfl: 2   sacubitril-valsartan (ENTRESTO) 97-103 MG, Take 1 tablet by mouth 2 (two) times daily., Disp: 180 tablet, Rfl: 3   spironolactone (ALDACTONE) 50 MG tablet, TAKE 1 TABLET BY MOUTH DAILY, Disp: 100 tablet, Rfl: 2  Cardiovascular studies:  EKG 01/22/2023: Sinus rhythm 69 bpm Ventricular pacing  Remote CRT-P transmission 01/20/2023 Longevity 4 years and 2 months. AP 46 %, CRT 98 %. Lead impedance and thresholds are normal. 1 episode of brief atrial  tachycardia since last transmission a month ago, brief for 10 seconds. Previously she has had about 60 episodes of brief atrial tachycardia episodes but no sustained atrial fibrillation.AT/AF burden <1%. Thoracic impedance is at baseline. Normal CRT-P function.    Scheduled  In office pacemaker check 10/01/2022  Single (S)/Dual (D)/BV: BV. Presenting ASBP. Pacemaker dependant:  No. Underlying NSR @ 70/min. AP 58%, BP 97%. AMS Episodes 4 longest 20 min or longer, EGM suggests SVT on 09/18/2022 at 8:32AM. No symptoms.  HVR 4, EGM SVT.  Longevity 4.8 Years. Magnet rate: >85%. Lead measurements: Stable. Thoracic impedance: Stable and baseline without fluid overload. Histogram: Low (L)/normal (N)/high (H)  Normal. Patient activity Good.    Observations: Normal CRTP function. Changes: Changed Ventricular detection rate down to 150 to detect SVT duration and trigger to record from 1 min to 3 min.    Patient appears to have sustained episodes of SVT on 09/18/2022, probably greater than 20 minutes.  We will keep following as remotely.  Patient asymptomatic.  Echocardiogram 08/07/2021:  Left ventricle cavity is normal in size. Moderate concentric hypertrophy  of the left ventricle. Normal global wall motion. Normal LV systolic  function with visual EF 55-60%. Doppler evidence of grade I (impaired)  diastolic dysfunction, normal LAP.  Moderate (Grade II) mitral regurgitation.  Mild tricuspid regurgitation. Estimated pulmonary artery systolic pressure  32 mmHg.  Compared to previous study in 2020, LVEF  has improved from 45-50%.   RHC/LHC 10/12/2018: Normal coronary arteries with minimal luminal irregularities No angiographically significant coronary artery disease   RA: 18 mmHg RV: 44/9 mmHg, RVEDP 21 mmHg PA: 51/25 mmHg. Mean PA 33 mmHg PCWP: 20 mmHg LV: 108/15 mmHg, LVEDP 23 mmHg   CO: 4.7 L/min CI: 2.6 L/min/m2   Conclusion: Nonischemic cardiomyopathy with mild decompensation Mild WHO  Grp II pulmonary hypertension    Recent labs: 09/08/2022: Glucose 107, BUN/Cr 19/1.29. EGFR 41. Na/K 141/4.0. Rest of the CMP normal H/H 13/42. MCV 93. Platelets 196 HbA1C 5.7% Chol 160, TG 111, HDL 51, LDL 89 TSH 4.7 normal    Review of Systems  Review of Systems  Constitutional: Negative for weight gain.  Cardiovascular:  Negative for chest pain, dyspnea on exertion (Mild, stable), leg swelling, palpitations and syncope.         Vitals:   01/22/23 1126  BP: 123/73  Pulse: 83  Resp: 18  SpO2: 97%      Objective:    Physical Exam Vitals and nursing note reviewed.  Constitutional:      General: She is not in acute distress. Neck:     Vascular: No JVD.  Cardiovascular:     Rate and Rhythm: Normal rate and regular rhythm.     Heart sounds: Normal heart sounds. No murmur heard. Pulmonary:     Effort: Pulmonary effort is normal.     Breath sounds: Normal breath sounds. No wheezing or rales.  Musculoskeletal:     Right lower leg: No edema.     Left lower leg: No edema.          Assessment & Recommendations:   82 y/o Caucasian female w/nonischemic cardiomyopathy with recovered EF, mild t mod MR, TR, hypertension, paroxysmal Afib, hyperlipidemia, h/o breast cancer s/p radiation therapy 2018, now s/p CRT-P placement 10/2018.  Nonischemic cardiomyopathy: NYHA class II.  LVEF improved to 45-50% (07/2019) with guideline directed medical therapy, including BiV pacing. Continue guideline directed medical therapy. 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  No episodes of A-fib seen since CRT-P.  Patient had difficulty with the cost of Eliquis.  Given her advanced age, bleeding risk is not insignificant.  Given no  documented recurrence of A-fib since 2019, both patient, her daughter, and I mutually decided to discontinue anticoagulation in 01/2022. She has had brief asymptomatic episodes of AT not AF without symptoms. Monitor for now.   Hypertension: Well controlled.  Dry cough: No increased impedance noted on pacemaker. I suspect this is due to seasonal allergies.   F/u in 1 year   Nigel Mormon, MD Pager: [phone] Office: [phone]

## 2023-02-05 DIAGNOSIS — Z45018 Encounter for adjustment and management of other part of cardiac pacemaker: Secondary | ICD-10-CM | POA: Diagnosis not present

## 2023-02-05 DIAGNOSIS — I5042 Chronic combined systolic (congestive) and diastolic (congestive) heart failure: Secondary | ICD-10-CM | POA: Diagnosis not present

## 2023-02-20 DIAGNOSIS — I5042 Chronic combined systolic (congestive) and diastolic (congestive) heart failure: Secondary | ICD-10-CM | POA: Diagnosis not present

## 2023-02-20 DIAGNOSIS — Z95810 Presence of automatic (implantable) cardiac defibrillator: Secondary | ICD-10-CM | POA: Diagnosis not present

## 2023-02-20 DIAGNOSIS — Z4502 Encounter for adjustment and management of automatic implantable cardiac defibrillator: Secondary | ICD-10-CM | POA: Diagnosis not present

## 2023-03-23 DIAGNOSIS — I5042 Chronic combined systolic (congestive) and diastolic (congestive) heart failure: Secondary | ICD-10-CM | POA: Diagnosis not present

## 2023-03-23 DIAGNOSIS — Z95810 Presence of automatic (implantable) cardiac defibrillator: Secondary | ICD-10-CM | POA: Diagnosis not present

## 2023-03-23 DIAGNOSIS — Z4509 Encounter for adjustment and management of other cardiac device: Secondary | ICD-10-CM | POA: Diagnosis not present

## 2023-04-03 DIAGNOSIS — J9801 Acute bronchospasm: Secondary | ICD-10-CM | POA: Diagnosis not present

## 2023-04-03 DIAGNOSIS — R051 Acute cough: Secondary | ICD-10-CM | POA: Diagnosis not present

## 2023-04-03 DIAGNOSIS — H6502 Acute serous otitis media, left ear: Secondary | ICD-10-CM | POA: Diagnosis not present

## 2023-04-15 ENCOUNTER — Other Ambulatory Visit: Payer: Self-pay | Admitting: Cardiology

## 2023-04-23 DIAGNOSIS — Z4502 Encounter for adjustment and management of automatic implantable cardiac defibrillator: Secondary | ICD-10-CM | POA: Diagnosis not present

## 2023-04-23 DIAGNOSIS — Z95810 Presence of automatic (implantable) cardiac defibrillator: Secondary | ICD-10-CM | POA: Diagnosis not present

## 2023-04-23 DIAGNOSIS — I5042 Chronic combined systolic (congestive) and diastolic (congestive) heart failure: Secondary | ICD-10-CM | POA: Diagnosis not present

## 2023-04-30 ENCOUNTER — Ambulatory Visit: Payer: Medicare Other | Admitting: Cardiology

## 2023-05-07 DIAGNOSIS — I5042 Chronic combined systolic (congestive) and diastolic (congestive) heart failure: Secondary | ICD-10-CM | POA: Diagnosis not present

## 2023-05-07 DIAGNOSIS — Z950 Presence of cardiac pacemaker: Secondary | ICD-10-CM | POA: Diagnosis not present

## 2023-05-21 ENCOUNTER — Other Ambulatory Visit: Payer: Self-pay | Admitting: Cardiology

## 2023-05-24 DIAGNOSIS — Z4502 Encounter for adjustment and management of automatic implantable cardiac defibrillator: Secondary | ICD-10-CM | POA: Diagnosis not present

## 2023-05-24 DIAGNOSIS — Z95810 Presence of automatic (implantable) cardiac defibrillator: Secondary | ICD-10-CM | POA: Diagnosis not present

## 2023-05-24 DIAGNOSIS — I5042 Chronic combined systolic (congestive) and diastolic (congestive) heart failure: Secondary | ICD-10-CM | POA: Diagnosis not present

## 2023-06-18 ENCOUNTER — Other Ambulatory Visit: Payer: Self-pay | Admitting: Cardiology

## 2023-06-18 DIAGNOSIS — I5023 Acute on chronic systolic (congestive) heart failure: Secondary | ICD-10-CM

## 2023-06-24 DIAGNOSIS — Z4502 Encounter for adjustment and management of automatic implantable cardiac defibrillator: Secondary | ICD-10-CM | POA: Diagnosis not present

## 2023-06-24 DIAGNOSIS — I5042 Chronic combined systolic (congestive) and diastolic (congestive) heart failure: Secondary | ICD-10-CM | POA: Diagnosis not present

## 2023-06-24 DIAGNOSIS — Z95810 Presence of automatic (implantable) cardiac defibrillator: Secondary | ICD-10-CM | POA: Diagnosis not present

## 2023-07-25 DIAGNOSIS — I5042 Chronic combined systolic (congestive) and diastolic (congestive) heart failure: Secondary | ICD-10-CM | POA: Diagnosis not present

## 2023-07-25 DIAGNOSIS — Z95810 Presence of automatic (implantable) cardiac defibrillator: Secondary | ICD-10-CM | POA: Diagnosis not present

## 2023-07-25 DIAGNOSIS — Z4502 Encounter for adjustment and management of automatic implantable cardiac defibrillator: Secondary | ICD-10-CM | POA: Diagnosis not present

## 2023-08-06 DIAGNOSIS — Z45018 Encounter for adjustment and management of other part of cardiac pacemaker: Secondary | ICD-10-CM | POA: Diagnosis not present

## 2023-08-06 DIAGNOSIS — I5042 Chronic combined systolic (congestive) and diastolic (congestive) heart failure: Secondary | ICD-10-CM | POA: Diagnosis not present

## 2023-08-25 DIAGNOSIS — I5042 Chronic combined systolic (congestive) and diastolic (congestive) heart failure: Secondary | ICD-10-CM | POA: Diagnosis not present

## 2023-08-25 DIAGNOSIS — Z4502 Encounter for adjustment and management of automatic implantable cardiac defibrillator: Secondary | ICD-10-CM | POA: Diagnosis not present

## 2023-08-26 ENCOUNTER — Telehealth: Payer: Self-pay | Admitting: Cardiology

## 2023-08-26 NOTE — Telephone Encounter (Signed)
Patwardhan, Anabel Bene, MD  Staley, Demetris R, LPN Cc: P Cv Div [facility] St Triage Caller: Unspecified (Today, 12:28 PM) I may have refilled it occasionally if patient requested (I don't recollect the specific details), but historically not prescribed by me. I do not know if manufacturer change is of significance. It may be best to check with PCP.  Thanks MJP     Called the pts Pharmacist at [facility], and informed him that per Dr. Rosemary Holms he advised that further management/refills/maintenance of pts levothyroxine, should come from her PCP.  Pharmacist at [facility] said he has the pts PCP on file, and will refer this refill request to Dr. Thomasena Edis PCP to further manage from here on out.   [facility] Pharmacist verbalized understanding and agrees with this plan.

## 2023-08-26 NOTE — Telephone Encounter (Signed)
I may have refilled it occasionally if patient requested (I don't recollect the specific details), but historically not prescribed by me. I do not know if manufacturer change is of significance. It may be best to check with PCP.  Thanks MJP

## 2023-08-26 NOTE — Telephone Encounter (Signed)
Pt c/o medication issue:  1. Name of Medication: levothyroxine (SYNTHROID) 88 MCG tablet   2. How are you currently taking this medication (dosage and times per day)? N/A   3. Are you having a reaction (difficulty breathing--STAT)? No   4. What is your medication issue? Needing provider approval for manufacturer change on this medication

## 2023-08-26 NOTE — Telephone Encounter (Signed)
I do not think I prescribed this medication to her. Recommend checking with PCP or the prescribing provider for this medication.  Thanks MJP

## 2023-09-14 ENCOUNTER — Telehealth: Payer: Self-pay

## 2023-09-14 ENCOUNTER — Telehealth: Payer: Self-pay | Admitting: Cardiology

## 2023-09-14 NOTE — Telephone Encounter (Signed)
RECEIVED DOCUMENTS, REVIEWING

## 2023-09-14 NOTE — Telephone Encounter (Signed)
Paper Work Dropped Off:  APPLICATION  Date:09/14/2023  Location of paper:  [place] [employer]

## 2023-09-14 NOTE — Telephone Encounter (Signed)
Pt's [employer] patient assistance application was scanned to [employer], [facility] email. FYI

## 2023-09-15 ENCOUNTER — Other Ambulatory Visit (HOSPITAL_COMMUNITY): Payer: Self-pay

## 2023-09-15 MED ORDER — SACUBITRIL-VALSARTAN 97-103 MG PO TABS: 1.0000 | ORAL_TABLET | Freq: Two times a day (BID) | ORAL | 3 refills | Status: DC

## 2023-09-15 NOTE — Telephone Encounter (Signed)
Please send in prescription for [facility] to  [facility] [place], [place] - [phone], [place] [phone]   including grant billing information in RX comment [phone], WUX:[id number], Group: [id number], QI:[id number])

## 2023-09-15 NOTE — Telephone Encounter (Signed)
Script sent to requested pharmacy

## 2023-09-22 ENCOUNTER — Telehealth: Payer: Self-pay | Admitting: Cardiology

## 2023-09-22 NOTE — Telephone Encounter (Signed)
Left message for patient's daughter to call back. 

## 2023-09-22 NOTE — Telephone Encounter (Signed)
Pt's daughter would like a call back as to  when she can pick up the pt assistance paperwork she dropped off.

## 2023-09-23 ENCOUNTER — Other Ambulatory Visit: Payer: Self-pay | Admitting: Cardiology

## 2023-09-23 DIAGNOSIS — I5022 Chronic systolic (congestive) heart failure: Secondary | ICD-10-CM

## 2023-09-23 NOTE — Telephone Encounter (Signed)
Left voicemail to return call to office.

## 2023-09-23 NOTE — Telephone Encounter (Signed)
Patient was returning call. Please advise ?

## 2023-09-23 NOTE — Telephone Encounter (Signed)
Daughter was returning call. Please advise  ?

## 2023-09-24 NOTE — Telephone Encounter (Signed)
Called the pts daughter Carlisle Beers back and endorsed to her that per our pt assistance team member, the pt should not need [employer] application assistance since they were approved for a [employer], which completely covers the cost of their Entresto. [facility] has shipped the pts medication out this week to her home address. Elliot Dally that If patient needs shipping updates they can touch base with [facility] at [phone].   Luann verbalized understanding and agrees with this plan, and will endorse this information to the pt. Carlisle Beers was gracious for all the assistance provided to this matter.

## 2023-09-24 NOTE — Telephone Encounter (Signed)
Left the pt a message to call the office back about application form question.

## 2023-09-24 NOTE — Telephone Encounter (Signed)
Will forward this message to our pt assistance team member to follow-up with the pt about her [employer] application.  They have been managing this for the pt.  Pt needs a call back to discuss this.

## 2023-09-24 NOTE — Telephone Encounter (Signed)
Patient should not need [employer] application assistance since they were approved for a [employer], which completely covers the cost of their Entresto. [facility] has shipped their medication out this week to the patient's home. If patient needs shipping updates they can touch base with [facility] pharmacy at [phone].

## 2023-09-28 NOTE — Telephone Encounter (Signed)
Erin Novak will you call Erin Novak daughter when you can she is wanting to pick up that application that she dropped off for her mom her name is Erin Novak [phone].

## 2023-09-29 DIAGNOSIS — E039 Hypothyroidism, unspecified: Secondary | ICD-10-CM | POA: Diagnosis not present

## 2023-09-29 DIAGNOSIS — I10 Essential (primary) hypertension: Secondary | ICD-10-CM | POA: Diagnosis not present

## 2023-09-29 DIAGNOSIS — R7309 Other abnormal glucose: Secondary | ICD-10-CM | POA: Diagnosis not present

## 2023-09-29 DIAGNOSIS — E7800 Pure hypercholesterolemia, unspecified: Secondary | ICD-10-CM | POA: Diagnosis not present

## 2023-10-02 ENCOUNTER — Ambulatory Visit (INDEPENDENT_AMBULATORY_CARE_PROVIDER_SITE_OTHER): Payer: Medicare Other

## 2023-10-02 DIAGNOSIS — I5022 Chronic systolic (congestive) heart failure: Secondary | ICD-10-CM

## 2023-10-03 LAB — CUP PACEART REMOTE DEVICE CHECK
Battery Remaining Longevity: 42 mo
Battery Remaining Percentage: 40 %
Battery Voltage: 2.99 V
Brady Statistic AP VP Percent: 32 %
Brady Statistic AP VS Percent: 1 %
Brady Statistic AS VP Percent: 66 %
Brady Statistic AS VS Percent: 1 %
Brady Statistic RA Percent Paced: 31 %
Date Time Interrogation Session: 20241115020034
Implantable Lead Connection Status: 753985
Implantable Lead Connection Status: 753985
Implantable Lead Connection Status: 753985
Implantable Lead Implant Date: 20191213
Implantable Lead Implant Date: 20191213
Implantable Lead Implant Date: 20191213
Implantable Lead Location: 753858
Implantable Lead Location: 753859
Implantable Lead Location: 753860
Implantable Pulse Generator Implant Date: 20191213
Lead Channel Impedance Value: 1575 Ohm
Lead Channel Impedance Value: 340 Ohm
Lead Channel Impedance Value: 580 Ohm
Lead Channel Pacing Threshold Amplitude: 0.375 V
Lead Channel Pacing Threshold Amplitude: 0.625 V
Lead Channel Pacing Threshold Amplitude: 1.625 V
Lead Channel Pacing Threshold Pulse Width: 0.5 ms
Lead Channel Pacing Threshold Pulse Width: 0.5 ms
Lead Channel Pacing Threshold Pulse Width: 0.5 ms
Lead Channel Sensing Intrinsic Amplitude: 1.5 mV
Lead Channel Sensing Intrinsic Amplitude: 12 mV
Lead Channel Setting Pacing Amplitude: 1.375
Lead Channel Setting Pacing Amplitude: 2 V
Lead Channel Setting Pacing Amplitude: 2.625
Lead Channel Setting Pacing Pulse Width: 0.5 ms
Lead Channel Setting Pacing Pulse Width: 0.5 ms
Lead Channel Setting Sensing Sensitivity: 2 mV
Pulse Gen Model: 3562
Pulse Gen Serial Number: 9064261

## 2023-10-07 DIAGNOSIS — E039 Hypothyroidism, unspecified: Secondary | ICD-10-CM | POA: Diagnosis not present

## 2023-10-07 DIAGNOSIS — Z0000 Encounter for general adult medical examination without abnormal findings: Secondary | ICD-10-CM | POA: Diagnosis not present

## 2023-10-07 DIAGNOSIS — Z23 Encounter for immunization: Secondary | ICD-10-CM | POA: Diagnosis not present

## 2023-10-07 DIAGNOSIS — I429 Cardiomyopathy, unspecified: Secondary | ICD-10-CM | POA: Diagnosis not present

## 2023-10-07 DIAGNOSIS — E7800 Pure hypercholesterolemia, unspecified: Secondary | ICD-10-CM | POA: Diagnosis not present

## 2023-10-07 DIAGNOSIS — I480 Paroxysmal atrial fibrillation: Secondary | ICD-10-CM | POA: Diagnosis not present

## 2023-10-07 DIAGNOSIS — Z95810 Presence of automatic (implantable) cardiac defibrillator: Secondary | ICD-10-CM | POA: Diagnosis not present

## 2023-10-14 NOTE — Progress Notes (Signed)
Remote pacemaker transmission.   

## 2023-12-22 ENCOUNTER — Encounter: Payer: Self-pay | Admitting: Internal Medicine

## 2023-12-22 ENCOUNTER — Ambulatory Visit: Payer: Medicare Other | Attending: Internal Medicine | Admitting: Internal Medicine

## 2023-12-22 VITALS — BP 138/82 | HR 79 | Ht 63.0 in | Wt 187.2 lb

## 2023-12-22 DIAGNOSIS — I5020 Unspecified systolic (congestive) heart failure: Secondary | ICD-10-CM

## 2023-12-22 DIAGNOSIS — I5022 Chronic systolic (congestive) heart failure: Secondary | ICD-10-CM | POA: Diagnosis not present

## 2023-12-22 DIAGNOSIS — I471 Supraventricular tachycardia, unspecified: Secondary | ICD-10-CM | POA: Diagnosis not present

## 2023-12-22 DIAGNOSIS — I447 Left bundle-branch block, unspecified: Secondary | ICD-10-CM

## 2023-12-22 DIAGNOSIS — Z950 Presence of cardiac pacemaker: Secondary | ICD-10-CM

## 2023-12-22 LAB — CUP PACEART INCLINIC DEVICE CHECK
Battery Remaining Longevity: 39 mo
Battery Voltage: 2.98 V
Brady Statistic RA Percent Paced: 35 %
Brady Statistic RV Percent Paced: 98 %
Date Time Interrogation Session: 20250204125621
Implantable Lead Connection Status: 753985
Implantable Lead Connection Status: 753985
Implantable Lead Connection Status: 753985
Implantable Lead Implant Date: 20191213
Implantable Lead Implant Date: 20191213
Implantable Lead Implant Date: 20191213
Implantable Lead Location: 753858
Implantable Lead Location: 753859
Implantable Lead Location: 753860
Implantable Pulse Generator Implant Date: 20191213
Lead Channel Impedance Value: 1625 Ohm
Lead Channel Impedance Value: 350 Ohm
Lead Channel Impedance Value: 612.5 Ohm
Lead Channel Pacing Threshold Amplitude: 0.5 V
Lead Channel Pacing Threshold Amplitude: 0.5 V
Lead Channel Pacing Threshold Amplitude: 0.75 V
Lead Channel Pacing Threshold Amplitude: 0.75 V
Lead Channel Pacing Threshold Amplitude: 1.75 V
Lead Channel Pacing Threshold Amplitude: 1.75 V
Lead Channel Pacing Threshold Pulse Width: 0.5 ms
Lead Channel Pacing Threshold Pulse Width: 0.5 ms
Lead Channel Pacing Threshold Pulse Width: 0.5 ms
Lead Channel Pacing Threshold Pulse Width: 0.5 ms
Lead Channel Pacing Threshold Pulse Width: 0.5 ms
Lead Channel Pacing Threshold Pulse Width: 0.5 ms
Lead Channel Sensing Intrinsic Amplitude: 12 mV
Lead Channel Sensing Intrinsic Amplitude: 2 mV
Lead Channel Setting Pacing Amplitude: 1.5 V
Lead Channel Setting Pacing Amplitude: 2 V
Lead Channel Setting Pacing Amplitude: 2.75 V
Lead Channel Setting Pacing Pulse Width: 0.5 ms
Lead Channel Setting Pacing Pulse Width: 0.5 ms
Lead Channel Setting Sensing Sensitivity: 2 mV
Pulse Gen Model: 3562
Pulse Gen Serial Number: 9064261
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
## 2023-12-22 NOTE — Patient Instructions (Signed)
 Medication Instructions:  Your physician recommends that you continue on your current medications as directed. Please refer to the Current Medication list given to you today.  *If you need a refill on your cardiac medications before your next appointment, please call your pharmacy*  Lab Work: None ordered.  If you have labs (blood work) drawn today and your tests are completely normal, you will receive your results only by: MyChart Message (if you have MyChart) OR A paper copy in the mail If you have any lab test that is abnormal or we need to change your treatment, we will call you to review the results.  Testing/Procedures: None ordered.  Follow-Up: At [facility], you and your health needs are our priority.  As part of our continuing mission to provide you with exceptional heart care, we have created designated Provider Care Teams.  These Care Teams include your primary Cardiologist (physician) and Advanced Practice Providers (APPs -  Physician Assistants and Nurse Practitioners) who all work together to provide you with the care you need, when you need it.   Your next appointment:   1 year(s)  The format for your next appointment:   In Person  Provider:   Dr. Virl Son one of the following Advanced Practice Providers on your designated Care Team:   Francis Dowse, [place] Casimiro Needle "Mardelle Matte" [place], [place] Earnest Rosier, NP  Remote monitoring is used to monitor your Pacemaker/ ICD from home. This monitoring reduces the number of office visits required to check your device to one time per year. It allows [place] to keep an eye on the functioning of your device to ensure it is working properly.   Important Information About Sugar

## 2023-12-27 ENCOUNTER — Other Ambulatory Visit: Payer: Self-pay | Admitting: Cardiology

## 2023-12-29 NOTE — Telephone Encounter (Signed)
Pt's pharmacy is requesting a refill on anastrozole. Would Dr. Rosemary Holms like to refill this non  cardiac medication? Please address

## 2023-12-30 NOTE — Telephone Encounter (Signed)
This is generally not prescribed by me.  It is possible that it may have sent a prescription in the past to overcome a prescription gap.  I will defer this to her PCP.  Thanks MJP

## 2024-01-20 ENCOUNTER — Ambulatory Visit: Payer: Medicare Other | Admitting: Cardiology

## 2024-01-22 ENCOUNTER — Ambulatory Visit: Payer: Self-pay | Admitting: Cardiology

## 2024-02-17 ENCOUNTER — Encounter: Payer: Self-pay | Admitting: Cardiology

## 2024-02-17 ENCOUNTER — Ambulatory Visit: Attending: Cardiology | Admitting: Cardiology

## 2024-02-17 VITALS — BP 160/90 | HR 71 | Resp 16 | Ht 63.0 in | Wt 183.6 lb

## 2024-02-17 DIAGNOSIS — I10 Essential (primary) hypertension: Secondary | ICD-10-CM | POA: Diagnosis not present

## 2024-02-17 DIAGNOSIS — I428 Other cardiomyopathies: Secondary | ICD-10-CM | POA: Diagnosis not present

## 2024-02-17 DIAGNOSIS — Z79899 Other long term (current) drug therapy: Secondary | ICD-10-CM | POA: Diagnosis not present

## 2024-02-17 MED ORDER — AMLODIPINE BESYLATE 10 MG PO TABS: 10.0000 mg | ORAL_TABLET | Freq: Every day | ORAL | 3 refills | Status: DC

## 2024-02-17 NOTE — Progress Notes (Signed)
 Cardiology Office Note:  .   Date:  02/17/2024  ID:  Jadae Steinke, DOB 1941-03-03, MRN [id number] PCP: Irena Reichmann, DO  [facility] Providers Cardiologist:  Truett Mainland, MD PCP: Irena Reichmann, DO  Chief Complaint  Patient presents with   Chronic systolic congestive heart failure   Follow-up     Talishia Betzler is a 83 y.o. female with hypertension, hyperlipidemia nonischemic cardiomyopathy, s/p CRT-P w/recovered EF, paroxysmal A-fib, h/o breast cancer s/p radiation  Patient is here today with her daughter.  She denies any complaints.  Blood pressure is elevated.  She reports compliance with her medication.  She does not check blood pressure regularly at home.    Vitals:   02/17/24 1429  BP: (!) 177/81  Pulse: 71  Resp: 16  SpO2: 97%      Review of Systems  Cardiovascular:  Negative for chest pain, dyspnea on exertion, leg swelling, palpitations and syncope.        Studies Reviewed: Marland Kitchen         Independently interpreted 09/2023: Chol 165, TG 173, HDL 45, LDL 90 HbA1C 6.1% Cr 1.1 TSH 3.5   Physical Exam Vitals and nursing note reviewed.  Constitutional:      General: She is not in acute distress. Neck:     Vascular: No JVD.  Cardiovascular:     Rate and Rhythm: Normal rate and regular rhythm.     Heart sounds: Normal heart sounds. No murmur heard. Pulmonary:     Effort: Pulmonary effort is normal.     Breath sounds: Normal breath sounds. No wheezing or rales.  Musculoskeletal:     Right lower leg: No edema.     Left lower leg: No edema.      VISIT DIAGNOSES:   ICD-10-CM   1. Essential hypertension  I10 amLODipine (NORVASC) 10 MG tablet    AMB Referral to [facility] Pharm-D    2. Nonischemic cardiomyopathy (HCC)  I42.8     3. Medication management  Z79.899 amLODipine (NORVASC) 10 MG tablet    AMB Referral to [facility] Pharm[facility]       Reatha Armour is a 83 y.o. female with hypertension, hyperlipidemia nonischemic  cardiomyopathy, s/p CRT-P w/recovered EF, paroxysmal A-fib, h/o breast cancer s/p radiation  Assessment & Plan  Hypertension: Uncontrolled. Increase amlodipine to 10 mg daily. Continue rest of the antihypertensive/GDMT for HFrEF.  Nonischemic cardiomyopathy: NYHA class II.  LVEF improved to 45-50% (07/2019) with guideline directed medical therapy, including BiV pacing. Continue guideline directed medical therapy. 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  No episodes of A-fib seen since CRT-P.  Patient had difficulty with the cost of Eliquis.  Given her advanced age, bleeding risk is not insignificant.  Given no documented recurrence of A-fib since 2019, both patient, her daughter, and we mutually decided to discontinue anticoagulation in 01/2022. No Afib noted on pacemaker interrogation.     Meds ordered this encounter  Medications   amLODipine (NORVASC) 10 MG tablet    Sig: Take 1 tablet (10 mg total) by mouth daily.    Dispense:  90 tablet    Refill:  3    Dose increase     F/u w/pharm D in  6-8 weeks for hypertension medication management. F/u w/me in 6 months  Signed, Elder Negus, MD

## 2024-02-17 NOTE — Patient Instructions (Signed)
 Medication Instructions:   INCREASE YOUR AMLODIPINE TO 10 MG BY MOUTH DAILY  *If you need a refill on your cardiac medications before your next appointment, please call your pharmacy*   You have been referred to OUR BLOOD PRESSURE CLINIC TO SEE THE PHARMACIST IN 6-8 WEEKS    Follow-Up:  6 MONTHS WITH DR [facility]         1st Floor: - Lobby - Registration  - Pharmacy  - Lab - Cafe  2nd Floor: - PV Lab - Diagnostic Testing (echo, CT, nuclear med)  3rd Floor: - Vacant  4th Floor: - TCTS (cardiothoracic surgery) - AFib Clinic - Structural Heart Clinic - Vascular Surgery  - Vascular Ultrasound  5th Floor: - HeartCare Cardiology (general and EP) - Clinical Pharmacy for coumadin, hypertension, lipid, weight-loss medications, and med management appointments    Valet parking services will be available as well.

## 2024-02-18 ENCOUNTER — Other Ambulatory Visit: Payer: Self-pay

## 2024-02-18 ENCOUNTER — Emergency Department (HOSPITAL_BASED_OUTPATIENT_CLINIC_OR_DEPARTMENT_OTHER)
Admission: EM | Admit: 2024-02-18 | Discharge: 2024-02-18 | Disposition: A | Discharge status: Home / Self Care | Attending: Emergency Medicine | Admitting: Emergency Medicine

## 2024-02-18 ENCOUNTER — Encounter (HOSPITAL_BASED_OUTPATIENT_CLINIC_OR_DEPARTMENT_OTHER): Payer: Self-pay

## 2024-02-18 DIAGNOSIS — F0390 Unspecified dementia without behavioral disturbance: Secondary | ICD-10-CM | POA: Insufficient documentation

## 2024-02-18 DIAGNOSIS — S61211A Laceration without foreign body of left index finger without damage to nail, initial encounter: Secondary | ICD-10-CM | POA: Diagnosis not present

## 2024-02-18 DIAGNOSIS — W268XXA Contact with other sharp object(s), not elsewhere classified, initial encounter: Secondary | ICD-10-CM | POA: Diagnosis not present

## 2024-02-18 DIAGNOSIS — S6992XA Unspecified injury of left wrist, hand and finger(s), initial encounter: Secondary | ICD-10-CM | POA: Diagnosis present

## 2024-02-18 MED ORDER — LIDOCAINE HCL (PF) 1 % IJ SOLN: 5.0000 mL | Freq: Once | INTRAMUSCULAR | Status: DC

## 2024-02-18 MED ORDER — LIDOCAINE HCL (PF) 1 % IJ SOLN: 10.0000 mL | Freq: Once | INTRAMUSCULAR | Status: AC

## 2024-02-18 MED ORDER — LIDOCAINE HCL (PF) 1 % IJ SOLN
INTRAMUSCULAR | Status: AC
  Administered 2024-02-18: 10 mL
  Filled 2024-02-18: qty 5

## 2024-02-18 MED ORDER — LIDOCAINE HCL (PF) 1 % IJ SOLN
INTRAMUSCULAR | Status: AC
  Filled 2024-02-18: qty 5

## 2024-02-18 NOTE — Discharge Instructions (Signed)
 Please read and follow all provided instructions.  Your diagnoses today include:  1. Laceration of left index finger without foreign body without damage to nail, initial encounter    Tests performed today include: Vital signs. See below for your results today.   Medications prescribed:  None  Take any prescribed medications only as directed.   Home care instructions:  Follow any educational materials and wound care instructions contained in this packet.   Keep affected area above the level of your heart when possible to minimize swelling. Wash area gently twice a day with warm soapy water. Do not apply alcohol or hydrogen peroxide. Cover the area if it draining or weeping.   Follow-up instructions: Suture Removal: Return to the Emergency Department or see your primary care care doctor in 10-14 days for a recheck of your wound and removal of your sutures or staples.    Return instructions:  Return to the Emergency Department if you have: Fever Worsening pain Worsening swelling of the wound Pus draining from the wound Redness of the skin that moves away from the wound, especially if it streaks away from the affected area  Any other emergent concerns  Your vital signs today were: BP (!) 187/84 (BP Location: Right Arm)   Pulse 78   Temp 98.7 F (37.1 C)   Resp 20   SpO2 94%  If your blood pressure (BP) was elevated above 135/85 this visit, please have this repeated by your doctor within one month. --------------

## 2024-02-18 NOTE — ED Triage Notes (Signed)
 Arrives with daughter.   ~3cm laceration to left pointer finger. Pt unsure how it happened.   Memory impaired.
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
## 2024-02-25 DIAGNOSIS — M7989 Other specified soft tissue disorders: Secondary | ICD-10-CM | POA: Diagnosis not present

## 2024-02-25 DIAGNOSIS — M869 Osteomyelitis, unspecified: Secondary | ICD-10-CM | POA: Diagnosis not present

## 2024-02-25 DIAGNOSIS — L0390 Cellulitis, unspecified: Secondary | ICD-10-CM | POA: Diagnosis not present

## 2024-02-26 ENCOUNTER — Other Ambulatory Visit: Payer: Self-pay

## 2024-02-26 ENCOUNTER — Emergency Department (HOSPITAL_COMMUNITY)
Admission: EM | Admit: 2024-02-26 | Discharge: 2024-02-26 | Disposition: A | Discharge status: Home / Self Care | Attending: Emergency Medicine | Admitting: Emergency Medicine

## 2024-02-26 ENCOUNTER — Encounter (HOSPITAL_COMMUNITY): Payer: Self-pay | Admitting: *Deleted

## 2024-02-26 DIAGNOSIS — M7989 Other specified soft tissue disorders: Secondary | ICD-10-CM | POA: Diagnosis present

## 2024-02-26 DIAGNOSIS — L03012 Cellulitis of left finger: Secondary | ICD-10-CM | POA: Insufficient documentation

## 2024-02-26 LAB — COMPREHENSIVE METABOLIC PANEL WITH GFR
ALT: 13 U/L (ref 0–44)
AST: 21 U/L (ref 15–41)
Albumin: 3.7 g/dL (ref 3.5–5.0)
Alkaline Phosphatase: 75 U/L (ref 38–126)
Anion gap: 11 (ref 5–15)
BUN: 14 mg/dL (ref 8–23)
CO2: 24 mmol/L (ref 22–32)
Calcium: 9.2 mg/dL (ref 8.9–10.3)
Chloride: 101 mmol/L (ref 98–111)
Creatinine, Ser: 1.18 mg/dL — ABNORMAL HIGH (ref 0.44–1.00)
GFR, Estimated: 46 mL/min — ABNORMAL LOW (ref >60)
Glucose, Bld: 110 mg/dL — ABNORMAL HIGH (ref 70–99)
Potassium: 3.8 mmol/L (ref 3.5–5.1)
Sodium: 136 mmol/L (ref 135–145)
Total Bilirubin: 0.9 mg/dL (ref 0.0–1.2)
Total Protein: 7.7 g/dL (ref 6.5–8.1)

## 2024-02-26 LAB — CBC
HCT: 47.2 % — ABNORMAL HIGH (ref 36.0–46.0)
Hemoglobin: 15.6 g/dL — ABNORMAL HIGH (ref 12.0–15.0)
MCH: 30.5 pg (ref 26.0–34.0)
MCHC: 33.1 g/dL (ref 30.0–36.0)
MCV: 92.4 fL (ref 80.0–100.0)
Platelets: 211 10*3/uL (ref 150–400)
RBC: 5.11 MIL/uL (ref 3.87–5.11)
RDW: 13.6 % (ref 11.5–15.5)
WBC: 10.5 10*3/uL (ref 4.0–10.5)
nRBC: 0 % (ref 0.0–0.2)

## 2024-02-26 MED ORDER — LIDOCAINE HCL (PF) 1 % IJ SOLN: 30.0000 mL | Freq: Once | INTRAMUSCULAR | Status: DC

## 2024-02-26 MED ORDER — VANCOMYCIN HCL IN DEXTROSE 1-5 GM/200ML-% IV SOLN
1000.0000 mg | Freq: Once | INTRAVENOUS | Status: AC
  Administered 2024-02-26: 1000 mg via INTRAVENOUS
  Filled 2024-02-26: qty 200

## 2024-02-26 NOTE — ED Triage Notes (Signed)
 The pt had sutures one week ago [facility]  she has had redness and swelling  surrounding the  lt index finger that was sutured  a week ago they have been to an urgent care that sent her here

## 2024-02-26 NOTE — ED Provider Notes (Signed)
 [place] EMERGENCY DEPARTMENT AT [facility] Provider Note   CSN: [id number] Arrival date & time: 02/26/24  1502     History  Chief Complaint  Patient presents with   Wound Infection    Erin Novak is a 83 y.o. female.  HPI Patient has both daughters at bedside helping with history.  They state that she cut her finger on a knife about 8 days ago.  She had suture repair done to the digit with no complications at the time.  However over the last 4 days, she has had increased swelling and redness to the area.  Denies any fevers, but does endorse pain to the finger.  She was seen at urgent care yesterday where she was prescribed doxycycline with pus expressed from the cut and 2 sutures were removed.  She has had worsening pain today, has taken 2 pills from her doxycycline, went back to urgent care where they suggested she present here.  No known history of diabetes.     Home Medications Prior to Admission medications   Medication Sig Start Date End Date Taking? Authorizing Provider  amLODipine (NORVASC) 10 MG tablet Take 1 tablet (10 mg total) by mouth daily. 02/17/24   Patwardhan, Anabel Bene, MD  anastrozole (ARIMIDEX) 1 MG tablet TAKE 1 TABLET BY MOUTH DAILY 04/15/23   Patwardhan, Manish J, MD  atorvastatin (LIPITOR) 10 MG tablet TAKE 1 TABLET BY MOUTH DAILY 09/24/23   Patwardhan, Manish J, MD  furosemide (LASIX) 40 MG tablet TAKE 1 TABLET BY MOUTH DAILY 09/24/23   Patwardhan, Manish J, MD  levothyroxine (SYNTHROID) 88 MCG tablet TAKE 1 TABLET BY MOUTH IN THE  MORNING 05/22/23   Patwardhan, Manish J, MD  loratadine (CLARITIN) 10 MG tablet Take 10 mg by mouth daily.    [provider]  memantine [facility] TITRATION PACK) tablet pack Take 1 tablet by mouth See admin instructions. 01/14/22   [provider]  metoprolol succinate (TOPROL-XL) 100 MG 24 hr tablet TAKE 1 TABLET BY MOUTH ONCE  DAILY WITH MEAL OR IMMEDIATELY  FOLLOWING 09/24/23   Patwardhan, Manish J, MD   sacubitril-valsartan (ENTRESTO) 97-103 MG Take 1 tablet by mouth 2 (two) times daily. 09/15/23   Patwardhan, Anabel Bene, MD  spironolactone (ALDACTONE) 50 MG tablet TAKE 1 TABLET BY MOUTH DAILY 06/19/23   Patwardhan, Anabel Bene, MD      Allergies    Lisinopril, Percodan [oxycodone-aspirin], Prednisone, and Amoxicillin-pot clavulanate    Review of Systems   Review of Systems  Physical Exam Updated Vital Signs BP (!) 151/85   Pulse 77   Temp 99.2 F (37.3 C) (Oral)   Resp 16   Ht 5\' 3"  (1.6 m)   Wt 83.3 kg   SpO2 100%   BMI 32.53 kg/m  Physical Exam Vitals and nursing note reviewed.  Constitutional:      General: She is not in acute distress.    Appearance: She is well-developed.  HENT:     Head: Normocephalic and atraumatic.  Eyes:     Conjunctiva/sclera: Conjunctivae normal.  Cardiovascular:     Rate and Rhythm: Normal rate.  Pulmonary:     Effort: Pulmonary effort is normal. No respiratory distress.  Abdominal:     Palpations: Abdomen is soft.  Musculoskeletal:        General: No swelling.     Cervical back: Neck supple.     Comments: Left index finger with 1.5 cm laceration noted with surrounding erythema and swelling, intact  flexion and extension but with pain.  Trace purulence noted, but no crepitus.  Skin:    General: Skin is warm and dry.     Capillary Refill: Capillary refill takes less than 2 seconds.  Neurological:     Mental Status: She is alert.  Psychiatric:        Mood and Affect: Mood normal.        ED Results / Procedures / Treatments   Labs (all labs ordered are listed, but only abnormal results are displayed) Labs Reviewed  COMPREHENSIVE METABOLIC PANEL WITH GFR - Abnormal; Notable for the following components:      Result Value   Glucose, Bld 110 (*)    Creatinine, Ser 1.18 (*)    GFR, Estimated 46 (*)    All other components within normal limits  CBC - Abnormal; Notable for the following components:   Hemoglobin 15.6 (*)    HCT 47.2 (*)     All other components within normal limits    EKG None  Radiology No results found.  Procedures Suture Removal  Date/Time: 02/26/2024 9:14 PM  Performed by: Barrett Shell, MD Authorized by: Charlynne Pander, MD   Consent:    Consent obtained:  Verbal Location:    Location:  Upper extremity   Upper extremity location:  Hand   Hand location:  L index finger Procedure details:    Wound appearance:  Tender, red and warm (slightly purulent)   Number of sutures removed:  10 Post-procedure details:    Post-procedure assessment: thorough irrigation.   Procedure completion:  Tolerated well, no immediate complications Irrigation  Date/Time: 02/26/2024 9:17 PM  Performed by: Barrett Shell, MD Authorized by: Charlynne Pander, MD  Preparation: Patient was prepped and draped in the usual sterile fashion. Local anesthesia used: no  Anesthesia: Local anesthesia used: no  Sedation: Patient sedated: no  Patient tolerance: patient tolerated the procedure well with no immediate complications Comments: Copious irrigation after suture removal was performed with 700cc NS total.  Irrigation was performed with syringe with very minimal serosanguineous or purulent output.  Laceration site is superficial.       Medications Ordered in ED Medications  lidocaine (PF) (XYLOCAINE) 1 % injection 30 mL (30 mLs Infiltration Not Given 02/26/24 2022)  vancomycin (VANCOCIN) IVPB 1000 mg/200 mL premix (0 mg Intravenous Stopped 02/26/24 2117)    ED Course/ Medical Decision Making/ A&P                                 Medical Decision Making Risk Prescription drug management.   Patient is alert, afebrile, and hemodynamically stable in no acute distress.  See image above for her left index finger, which appears erythematous and slightly purulent.  Differential includes cellulitis, also considering flexor tenosynovitis, abscess, and osteomyelitis.  Patient did have x-ray of the left hand  yesterday, which did not demonstrate the latter did not show any fractures.  Given that patient has only taken 2 doses of her doxycycline, hard to say if she has failed outpatient management.  I removed sutures at bedside, with image noted above.  Labs returned with no leukocytosis, hemoglobin 15.6, unremarkable CMP with creatinine near her baseline.  She is also afebrile here.  Given no leukocytosis or fevers, I do believe patient's infection is well localized.  She is not quite exhibiting flexor tenosynovitis, but could be trending towards this if infection does not improve.  Will give  a dose of vancomycin in the ED.  I also performed copious irrigation to the index finger, which did not exhibit purulent discharge on expression of the site as well.  I spoke with hand surgeon Dr. Kerry Fort about patient's presentation to the ED today.  He is agreeable to following up outpatient Monday or Tuesday.  Suggested soap soaks as outlined in the AVS 3 times daily, and patient should continue taking her doxycycline.  I also placed the office phone number in AVS for family to call if they do not receive a call to schedule an appointment.  Daughters are at bedside and are agreeable to this plan.  Strict return ED precautions were given, and patient was discharged in stable condition.  Patient seen in conjunction with Dr. Silverio Lay, who agreed with the above work-up and plan of care.         Final Clinical Impression(s) / ED Diagnoses Final diagnoses:  Cellulitis of finger of left hand    Rx / DC Orders ED Discharge Orders     None         Barrett Shell, MD 02/26/24 2356

## 2024-02-26 NOTE — Discharge Instructions (Addendum)
 Please follow-up with hand surgery for an appointment Monday or Tuesday.  At home, make sure to use antibacterial soap soaks (dial soap and warm water) for 10 minutes, three times a day.  Place dry dressings otherwise, no ointment or bandaids necessary.  You should receive a phone call from the hand surgery team. Call Monday morning if staff doesn't call.  Please return to the ED if finger swelling worsens with fevers and worsening persistent pain, or any other emergency symptoms.

## 2024-03-04 ENCOUNTER — Other Ambulatory Visit: Payer: Self-pay | Admitting: Cardiology

## 2024-03-04 DIAGNOSIS — I5022 Chronic systolic (congestive) heart failure: Secondary | ICD-10-CM

## 2024-03-08 DIAGNOSIS — M545 Low back pain, unspecified: Secondary | ICD-10-CM | POA: Diagnosis not present

## 2024-03-10 ENCOUNTER — Other Ambulatory Visit: Payer: Self-pay

## 2024-03-10 ENCOUNTER — Emergency Department (HOSPITAL_COMMUNITY)
Admission: EM | Admit: 2024-03-10 | Discharge: 2024-03-10 | Disposition: A | Discharge status: Home / Self Care | Source: Home / Self Care | Attending: Emergency Medicine | Admitting: Emergency Medicine

## 2024-03-10 ENCOUNTER — Emergency Department (HOSPITAL_COMMUNITY)

## 2024-03-10 ENCOUNTER — Encounter (HOSPITAL_COMMUNITY): Payer: Self-pay

## 2024-03-10 ENCOUNTER — Telehealth: Payer: Self-pay

## 2024-03-10 DIAGNOSIS — N189 Chronic kidney disease, unspecified: Secondary | ICD-10-CM | POA: Diagnosis not present

## 2024-03-10 DIAGNOSIS — N1832 Chronic kidney disease, stage 3b: Secondary | ICD-10-CM

## 2024-03-10 DIAGNOSIS — Z89622 Acquired absence of left hip joint: Secondary | ICD-10-CM | POA: Insufficient documentation

## 2024-03-10 DIAGNOSIS — M25552 Pain in left hip: Secondary | ICD-10-CM | POA: Insufficient documentation

## 2024-03-10 DIAGNOSIS — J811 Chronic pulmonary edema: Secondary | ICD-10-CM | POA: Diagnosis not present

## 2024-03-10 DIAGNOSIS — Z881 Allergy status to other antibiotic agents status: Secondary | ICD-10-CM | POA: Diagnosis not present

## 2024-03-10 DIAGNOSIS — I340 Nonrheumatic mitral (valve) insufficiency: Secondary | ICD-10-CM | POA: Diagnosis not present

## 2024-03-10 DIAGNOSIS — I499 Cardiac arrhythmia, unspecified: Secondary | ICD-10-CM | POA: Diagnosis not present

## 2024-03-10 DIAGNOSIS — R519 Headache, unspecified: Secondary | ICD-10-CM | POA: Diagnosis not present

## 2024-03-10 DIAGNOSIS — W19XXXA Unspecified fall, initial encounter: Secondary | ICD-10-CM | POA: Diagnosis not present

## 2024-03-10 DIAGNOSIS — R296 Repeated falls: Secondary | ICD-10-CM | POA: Diagnosis not present

## 2024-03-10 DIAGNOSIS — Z885 Allergy status to narcotic agent status: Secondary | ICD-10-CM | POA: Diagnosis not present

## 2024-03-10 DIAGNOSIS — Z923 Personal history of irradiation: Secondary | ICD-10-CM | POA: Diagnosis not present

## 2024-03-10 DIAGNOSIS — Z9049 Acquired absence of other specified parts of digestive tract: Secondary | ICD-10-CM | POA: Diagnosis not present

## 2024-03-10 DIAGNOSIS — S0990XA Unspecified injury of head, initial encounter: Secondary | ICD-10-CM | POA: Insufficient documentation

## 2024-03-10 DIAGNOSIS — Z743 Need for continuous supervision: Secondary | ICD-10-CM | POA: Diagnosis not present

## 2024-03-10 DIAGNOSIS — I428 Other cardiomyopathies: Secondary | ICD-10-CM | POA: Diagnosis not present

## 2024-03-10 DIAGNOSIS — Z79899 Other long term (current) drug therapy: Secondary | ICD-10-CM | POA: Insufficient documentation

## 2024-03-10 DIAGNOSIS — K449 Diaphragmatic hernia without obstruction or gangrene: Secondary | ICD-10-CM | POA: Diagnosis not present

## 2024-03-10 DIAGNOSIS — E039 Hypothyroidism, unspecified: Secondary | ICD-10-CM | POA: Insufficient documentation

## 2024-03-10 DIAGNOSIS — R6889 Other general symptoms and signs: Secondary | ICD-10-CM | POA: Diagnosis not present

## 2024-03-10 DIAGNOSIS — E785 Hyperlipidemia, unspecified: Secondary | ICD-10-CM | POA: Diagnosis not present

## 2024-03-10 DIAGNOSIS — E869 Volume depletion, unspecified: Secondary | ICD-10-CM | POA: Diagnosis not present

## 2024-03-10 DIAGNOSIS — S199XXA Unspecified injury of neck, initial encounter: Secondary | ICD-10-CM | POA: Diagnosis not present

## 2024-03-10 DIAGNOSIS — Z79811 Long term (current) use of aromatase inhibitors: Secondary | ICD-10-CM | POA: Diagnosis not present

## 2024-03-10 DIAGNOSIS — I480 Paroxysmal atrial fibrillation: Secondary | ICD-10-CM | POA: Diagnosis not present

## 2024-03-10 DIAGNOSIS — K5732 Diverticulitis of large intestine without perforation or abscess without bleeding: Secondary | ICD-10-CM | POA: Diagnosis not present

## 2024-03-10 DIAGNOSIS — I5022 Chronic systolic (congestive) heart failure: Secondary | ICD-10-CM | POA: Diagnosis not present

## 2024-03-10 DIAGNOSIS — I130 Hypertensive heart and chronic kidney disease with heart failure and stage 1 through stage 4 chronic kidney disease, or unspecified chronic kidney disease: Secondary | ICD-10-CM | POA: Diagnosis not present

## 2024-03-10 DIAGNOSIS — M8580 Other specified disorders of bone density and structure, unspecified site: Secondary | ICD-10-CM | POA: Diagnosis not present

## 2024-03-10 DIAGNOSIS — N179 Acute kidney failure, unspecified: Secondary | ICD-10-CM | POA: Diagnosis not present

## 2024-03-10 DIAGNOSIS — Z950 Presence of cardiac pacemaker: Secondary | ICD-10-CM | POA: Diagnosis not present

## 2024-03-10 DIAGNOSIS — M1612 Unilateral primary osteoarthritis, left hip: Secondary | ICD-10-CM | POA: Diagnosis not present

## 2024-03-10 DIAGNOSIS — I10 Essential (primary) hypertension: Secondary | ICD-10-CM

## 2024-03-10 DIAGNOSIS — M51369 Other intervertebral disc degeneration, lumbar region without mention of lumbar back pain or lower extremity pain: Secondary | ICD-10-CM | POA: Diagnosis not present

## 2024-03-10 DIAGNOSIS — K5730 Diverticulosis of large intestine without perforation or abscess without bleeding: Secondary | ICD-10-CM | POA: Diagnosis not present

## 2024-03-10 DIAGNOSIS — Z888 Allergy status to other drugs, medicaments and biological substances status: Secondary | ICD-10-CM | POA: Diagnosis not present

## 2024-03-10 DIAGNOSIS — M47816 Spondylosis without myelopathy or radiculopathy, lumbar region: Secondary | ICD-10-CM | POA: Diagnosis not present

## 2024-03-10 DIAGNOSIS — S3992XA Unspecified injury of lower back, initial encounter: Secondary | ICD-10-CM | POA: Diagnosis not present

## 2024-03-10 DIAGNOSIS — M4807 Spinal stenosis, lumbosacral region: Secondary | ICD-10-CM | POA: Diagnosis not present

## 2024-03-10 DIAGNOSIS — R6510 Systemic inflammatory response syndrome (SIRS) of non-infectious origin without acute organ dysfunction: Secondary | ICD-10-CM | POA: Diagnosis not present

## 2024-03-10 DIAGNOSIS — A419 Sepsis, unspecified organism: Secondary | ICD-10-CM | POA: Diagnosis not present

## 2024-03-10 DIAGNOSIS — Z751 Person awaiting admission to adequate facility elsewhere: Secondary | ICD-10-CM | POA: Diagnosis not present

## 2024-03-10 LAB — CBC WITH DIFFERENTIAL/PLATELET
Abs Immature Granulocytes: 0.05 10*3/uL (ref 0.00–0.07)
Basophils Absolute: 0.1 10*3/uL (ref 0.0–0.1)
Basophils Relative: 1 %
Eosinophils Absolute: 0 10*3/uL (ref 0.0–0.5)
Eosinophils Relative: 0 %
HCT: 46.3 % — ABNORMAL HIGH (ref 36.0–46.0)
Hemoglobin: 15.6 g/dL — ABNORMAL HIGH (ref 12.0–15.0)
Immature Granulocytes: 0 %
Lymphocytes Relative: 14 %
Lymphs Abs: 1.7 10*3/uL (ref 0.7–4.0)
MCH: 30.1 pg (ref 26.0–34.0)
MCHC: 33.7 g/dL (ref 30.0–36.0)
MCV: 89.4 fL (ref 80.0–100.0)
Monocytes Absolute: 0.9 10*3/uL (ref 0.1–1.0)
Monocytes Relative: 7 %
Neutro Abs: 9.7 10*3/uL — ABNORMAL HIGH (ref 1.7–7.7)
Neutrophils Relative %: 78 %
Platelets: 185 10*3/uL (ref 150–400)
RBC: 5.18 MIL/uL — ABNORMAL HIGH (ref 3.87–5.11)
RDW: 13.1 % (ref 11.5–15.5)
WBC: 12.4 10*3/uL — ABNORMAL HIGH (ref 4.0–10.5)
nRBC: 0 % (ref 0.0–0.2)

## 2024-03-10 LAB — BASIC METABOLIC PANEL WITH GFR
Anion gap: 14 (ref 5–15)
BUN: 48 mg/dL — ABNORMAL HIGH (ref 8–23)
CO2: 20 mmol/L — ABNORMAL LOW (ref 22–32)
Calcium: 9.3 mg/dL (ref 8.9–10.3)
Chloride: 102 mmol/L (ref 98–111)
Creatinine, Ser: 1.56 mg/dL — ABNORMAL HIGH (ref 0.44–1.00)
GFR, Estimated: 33 mL/min — ABNORMAL LOW (ref >60)
Glucose, Bld: 135 mg/dL — ABNORMAL HIGH (ref 70–99)
Potassium: 3.8 mmol/L (ref 3.5–5.1)
Sodium: 136 mmol/L (ref 135–145)

## 2024-03-10 LAB — CK: Total CK: 43 U/L (ref 38–234)

## 2024-03-10 LAB — LACTIC ACID, PLASMA: Lactic Acid, Venous: 1.6 mmol/L (ref 0.5–1.9)

## 2024-03-10 MED ORDER — HYDROCODONE-ACETAMINOPHEN 5-325 MG PO TABS
2.0000 | ORAL_TABLET | Freq: Once | ORAL | Status: AC
  Administered 2024-03-10: 2 via ORAL
  Filled 2024-03-10: qty 2

## 2024-03-10 MED ORDER — HYDROCODONE-ACETAMINOPHEN 5-325 MG PO TABS: 1.0000 | ORAL_TABLET | ORAL | 0 refills | Status: DC | PRN

## 2024-03-10 NOTE — ED Provider Notes (Signed)
 [place] EMERGENCY DEPARTMENT AT [facility] Provider Note   CSN: [id number] Arrival date & time: 02/26/24  1502     History  Chief Complaint  Patient presents with   Wound Infection    Erin Novak is a 83 y.o. female.  HPI Patient has both daughters at bedside helping with history.  They state that she cut her finger on a knife about 8 days ago.  She had suture repair done to the digit with no complications at the time.  However over the last 4 days, she has had increased swelling and redness to the area.  Denies any fevers, but does endorse pain to the finger.  She was seen at urgent care yesterday where she was prescribed doxycycline with pus expressed from the cut and 2 sutures were removed.  She has had worsening pain today, has taken 2 pills from her doxycycline, went back to urgent care where they suggested she present here.  No known history of diabetes.     Home Medications Prior to Admission medications   Medication Sig Start Date End Date Taking? Authorizing Provider  amLODipine  (NORVASC ) 10 MG tablet Take 1 tablet (10 mg total) by mouth daily. 02/17/24   Patwardhan, Manish J, MD  anastrozole  (ARIMIDEX ) 1 MG tablet TAKE 1 TABLET BY MOUTH DAILY 04/15/23   Patwardhan, Manish J, MD  atorvastatin  (LIPITOR) 10 MG tablet TAKE 1 TABLET BY MOUTH DAILY 03/07/24   Patwardhan, Manish J, MD  doxycycline (VIBRAMYCIN) 100 MG capsule Take 100 mg by mouth every 12 (twelve) hours. 02/25/24   [provider]  furosemide  (LASIX ) 40 MG tablet TAKE 1 TABLET BY MOUTH DAILY 03/07/24   Patwardhan, Manish J, MD  levothyroxine  (SYNTHROID ) 88 MCG tablet TAKE 1 TABLET BY MOUTH IN THE  MORNING 05/22/23   Patwardhan, Manish J, MD  loratadine  (CLARITIN ) 10 MG tablet Take 10 mg by mouth daily.    [provider]  memantine (NAMENDA) 10 MG tablet Take 10 mg by mouth 2 (two) times daily. 03/09/24   [provider]  metoprolol  succinate (TOPROL -XL) 100 MG 24 hr tablet TAKE  1 TABLET BY MOUTH ONCE  DAILY WITH MEAL OR IMMEDIATELY  FOLLOWING 03/07/24   Patwardhan, Kaye Parsons, MD  mupirocin  ointment (BACTROBAN ) 2 % Apply 1 Application topically 3 (three) times daily. 02/25/24   [provider]  sacubitril -valsartan  (ENTRESTO ) 97-103 MG Take 1 tablet by mouth 2 (two) times daily. 09/15/23   Patwardhan, Kaye Parsons, MD  spironolactone  (ALDACTONE ) 50 MG tablet TAKE 1 TABLET BY MOUTH DAILY 06/19/23   Patwardhan, Kaye Parsons, MD      Allergies    Percodan [oxycodone -aspirin ], Prednisone, Amoxicillin-pot clavulanate, and Lisinopril    Review of Systems   Review of Systems  Physical Exam Updated Vital Signs BP (!) 151/85   Pulse 77   Temp 99.2 F (37.3 C) (Oral)   Resp 16   Ht 5\' 3"  (1.6 m)   Wt 83.3 kg   SpO2 100%   BMI 32.53 kg/m  Physical Exam Vitals and nursing note reviewed.  Constitutional:      General: She is not in acute distress.    Appearance: She is well-developed.  HENT:     Head: Normocephalic and atraumatic.  Eyes:     Conjunctiva/sclera: Conjunctivae normal.  Cardiovascular:     Rate and Rhythm: Normal rate.  Pulmonary:     Effort: Pulmonary effort is normal. No respiratory distress.  Abdominal:     Palpations: Abdomen  is soft.  Musculoskeletal:        General: No swelling.     Cervical back: Neck supple.     Comments: Left index finger with 1.5 cm laceration noted with surrounding erythema and swelling, intact flexion and extension but with pain.  Trace purulence noted, but no crepitus.  Skin:    General: Skin is warm and dry.     Capillary Refill: Capillary refill takes less than 2 seconds.  Neurological:     Mental Status: She is alert.  Psychiatric:        Mood and Affect: Mood normal.        ED Results / Procedures / Treatments   Labs (all labs ordered are listed, but only abnormal results are displayed) Labs Reviewed  COMPREHENSIVE METABOLIC PANEL WITH GFR - Abnormal; Notable for the following components:      Result  Value   Glucose, Bld 110 (*)    Creatinine, Ser 1.18 (*)    GFR, Estimated 46 (*)    All other components within normal limits  CBC - Abnormal; Notable for the following components:   Hemoglobin 15.6 (*)    HCT 47.2 (*)    All other components within normal limits    EKG None  Radiology No results found.  Procedures Procedures    Medications Ordered in ED Medications  vancomycin  (VANCOCIN ) IVPB 1000 mg/200 mL premix (0 mg Intravenous Stopped 02/26/24 2117)    ED Course/ Medical Decision Making/ A&P                                 Medical Decision Making Risk Prescription drug management.   Patient is alert, afebrile, and hemodynamically stable in no acute distress.  See image above for her left index finger, which appears erythematous and slightly purulent.  Differential includes cellulitis, also considering flexor tenosynovitis, abscess, and osteomyelitis.  Patient did have x-ray of the left hand yesterday, which did not demonstrate the latter did not show any fractures.  Given that patient has only taken 2 doses of her doxycycline, hard to say if she has failed outpatient management.  I removed sutures at bedside, with image noted above.  Labs returned with no leukocytosis, hemoglobin 15.6, unremarkable CMP with creatinine near her baseline.  She is also afebrile here.  Given no leukocytosis or fevers, I do believe patient's infection is well localized.  She is not quite exhibiting flexor tenosynovitis, but could be trending towards this if infection does not improve.  Will give a dose of vancomycin  in the ED.  I also performed copious irrigation to the index finger, which did not exhibit purulent discharge on expression of the site as well.  I spoke with hand surgeon Dr. Elicia Ground about patient's presentation to the ED today.  He is agreeable to following up outpatient Monday or Tuesday.  Suggested soap soaks as outlined in the AVS 3 times daily, and patient should continue  taking her doxycycline.  I also placed the office phone number in AVS for family to call if they do not receive a call to schedule an appointment.  Daughters are at bedside and are agreeable to this plan.  Strict return ED precautions were given, and patient was discharged in stable condition.  Patient seen in conjunction with Dr. Delana Favors, who agreed with the above work-up and plan of care.   I saw and evaluated the patient, reviewed the resident's note and I  agree with the findings and plan.    Patient here with worsening drainage from L index finger. Had sutures done recently and on doxycycline and took 2 doses. Discussed with hand surgeon, Dr. Lamonte Pimenta. He recommend bedside washout and follow up outpatient. Given vanc in the ED. Gave strict return precautions     Final Clinical Impression(s) / ED Diagnoses Final diagnoses:  Cellulitis of finger of left hand    Rx / DC Orders ED Discharge Orders     None         Lorain Robson, MD 02/26/24 2356    Dalene Duck, MD 03/10/24 [id number]

## 2024-03-10 NOTE — Discharge Instructions (Addendum)
 As we discussed, I would like for you to follow-up with your primary care doctor early next week.  Is important that you continue to feel your body with adequate nutrients.  Please drink plenty of fluids.  Take the narcotic pain medication for breakthrough pain.  You can return to the emergency department for any worsening symptoms.

## 2024-03-10 NOTE — ED Triage Notes (Signed)
 Pt BIB ems from home. Pt was found by family "slid out of her chair." Pt complaining of left hip pain. Pt able to move both legs. EMS gave 100 mcg fentanyl  and 4 mg zofran  en route. Pt still rates pain 10/10 at this time.

## 2024-03-10 NOTE — ED Provider Notes (Signed)
 [address] EMERGENCY DEPARTMENT AT [facility] Provider Note   CSN: [id number] Arrival date & time: 03/10/24  1336     History Chief Complaint  Patient presents with   Erin Novak is a 83 y.o. female patient with history of cognitive decline, reflux, hypertension, hypothyroidism, hyperlipidemia who presents to the emergency department today for further evaluation of a fall.  Patient does not remember how or when she fell.  This seems to be at her baseline per daughter.  She states that last time I saw her was at night and she was up walking around.  They saw her this morning between 9 and 10 AM and she was on the floor.  She did not make it to the bed last night as the bed was still made.  Unclear how long the patient was on the floor for.  Patient complaining of left hip pain which is not a new problem per the daughter.  She states that the patient had a recent x-ray for hip pain this week which showed arthritis.  No evidence of anticoagulation based on chart review.   Fall       Home Medications Prior to Admission medications   Medication Sig Start Date End Date Taking? Authorizing Provider  acetaminophen  (TYLENOL ) 500 MG tablet Take 1,000 mg by mouth every 6 (six) hours as needed for mild pain (pain score 1-3).   Yes [provider]  amLODipine  (NORVASC ) 10 MG tablet Take 1 tablet (10 mg total) by mouth daily. 02/17/24  Yes Patwardhan, Manish J, MD  anastrozole  (ARIMIDEX ) 1 MG tablet TAKE 1 TABLET BY MOUTH DAILY 04/15/23  Yes Patwardhan, Manish J, MD  atorvastatin  (LIPITOR) 10 MG tablet TAKE 1 TABLET BY MOUTH DAILY 03/07/24  Yes Patwardhan, Manish J, MD  furosemide  (LASIX ) 40 MG tablet TAKE 1 TABLET BY MOUTH DAILY 03/07/24  Yes Patwardhan, Manish J, MD  HYDROcodone -acetaminophen  (NORCO/VICODIN) 5-325 MG tablet Take 1 tablet by mouth every 4 (four) hours as needed. 03/10/24  Yes Foy Vanduyne M, PA-C  levothyroxine  (SYNTHROID ) 88 MCG tablet TAKE 1 TABLET  BY MOUTH IN THE  MORNING 05/22/23  Yes Patwardhan, Manish J, MD  loratadine  (CLARITIN ) 10 MG tablet Take 10 mg by mouth daily.   Yes [provider]  memantine  (NAMENDA ) 10 MG tablet Take 10 mg by mouth 2 (two) times daily. 03/09/24  Yes [provider]  metoprolol  succinate (TOPROL -XL) 100 MG 24 hr tablet TAKE 1 TABLET BY MOUTH ONCE  DAILY WITH MEAL OR IMMEDIATELY  FOLLOWING 03/07/24  Yes Patwardhan, Manish J, MD  sacubitril -valsartan  (ENTRESTO ) 97-103 MG Take 1 tablet by mouth 2 (two) times daily. 09/15/23  Yes Patwardhan, Manish J, MD  spironolactone  (ALDACTONE ) 50 MG tablet TAKE 1 TABLET BY MOUTH DAILY 06/19/23  Yes Patwardhan, Kaye Parsons, MD      Allergies    Percodan [oxycodone -aspirin ], Prednisone, Amoxicillin-pot clavulanate, and Lisinopril    Review of Systems   Review of Systems  All other systems reviewed and are negative.   Physical Exam Updated Vital Signs BP 117/75   Pulse 61   Temp 97.6 F (36.4 C) (Oral)   Resp 16   Ht 5\' 3"  (1.6 m)   Wt 83.3 kg   SpO2 95%   BMI 32.53 kg/m  Physical Exam Vitals and nursing note reviewed.  Constitutional:      Appearance: Normal appearance.  HENT:     Head: Normocephalic and atraumatic.  Eyes:  General:        Right eye: No discharge.        Left eye: No discharge.     Conjunctiva/sclera: Conjunctivae normal.  Pulmonary:     Effort: Pulmonary effort is normal.  Musculoskeletal:     Comments: Minimally tender left hip to palpation.  Skin:    General: Skin is warm and dry.     Findings: No rash.  Neurological:     General: No focal deficit present.     Mental Status: She is alert.  Psychiatric:        Mood and Affect: Mood normal.        Behavior: Behavior normal.     ED Results / Procedures / Treatments   Labs (all labs ordered are listed, but only abnormal results are displayed) Labs Reviewed  CBC WITH DIFFERENTIAL/PLATELET - Abnormal; Notable for the following components:      Result Value    WBC 12.4 (*)    RBC 5.18 (*)    Hemoglobin 15.6 (*)    HCT 46.3 (*)    Neutro Abs 9.7 (*)    All other components within normal limits  BASIC METABOLIC PANEL WITH GFR - Abnormal; Notable for the following components:   CO2 20 (*)    Glucose, Bld 135 (*)    BUN 48 (*)    Creatinine, Ser 1.56 (*)    GFR, Estimated 33 (*)    All other components within normal limits  CK  LACTIC ACID, PLASMA    EKG None  Radiology CT Hip Left Wo Contrast Result Date: 03/10/2024 CLINICAL DATA:  left hip pain EXAM: CT OF THE LEFT HIP WITHOUT CONTRAST TECHNIQUE: Multidetector CT imaging of the left hip was performed according to the standard protocol. Multiplanar CT image reconstructions were also generated. RADIATION DOSE REDUCTION: This exam was performed according to the departmental dose-optimization program which includes automated exposure control, adjustment of the mA and/or kV according to patient size and/or use of iterative reconstruction technique. COMPARISON:  X-ray left hip 03/10/2024 FINDINGS: Bones/Joint/Cartilage No evidence of fracture, dislocation, or joint effusion. No evidence of severe arthropathy. No aggressive appearing focal bone abnormality. Ligaments Suboptimally assessed by CT. Muscles and Tendons Grossly unremarkable. Soft tissues No large hematoma formation. Other: Colonic diverticulosis.  Atherosclerotic plaque. IMPRESSION: Negative for acute traumatic injury. Electronically Signed   By: Morgane  Naveau M.D.   On: 03/10/2024 20:36   DG Hip Unilat W or Wo Pelvis 2-3 Views Left Result Date: 03/10/2024 CLINICAL DATA:  fall, left hip pain EXAM: DG HIP (WITH OR WITHOUT PELVIS) 2-3V LEFT COMPARISON:  None Available. FINDINGS: Diffuse osteopenia. Multilevel degenerative disc disease of the lumbar spine. There is a subtle lucency within the superior aspect of the left femoral neck without definite cortical disruption. No dislocation of the hips. The pelvic bones are otherwise intact without  fracture. IMPRESSION: Subtle lucency within the superior aspect of the left femoral neck, without definite cortical destruction. This could represent a nondisplaced fracture or nutrient foramen. A follow-up CT of the left hip is recommended for further characterization. Electronically Signed   By: Rance Burrows M.D.   On: 03/10/2024 19:04   CT Head Wo Contrast Result Date: 03/10/2024 CLINICAL DATA:  Head trauma, moderate-severe; Neck trauma (Age >= 65y) t was found by family "slid out of her chair." Pt complaining of left hip pain. Pt able to move both legs. EMS gave 100 mcg fentanyl  and 4 mg zofran  en route. Pt still rates pain  10/10 at this time. EXAM: CT HEAD WITHOUT CONTRAST CT CERVICAL SPINE WITHOUT CONTRAST TECHNIQUE: Multidetector CT imaging of the head and cervical spine was performed following the standard protocol without intravenous contrast. Multiplanar CT image reconstructions of the cervical spine were also generated. RADIATION DOSE REDUCTION: This exam was performed according to the departmental dose-optimization program which includes automated exposure control, adjustment of the mA and/or kV according to patient size and/or use of iterative reconstruction technique. COMPARISON:  None Available. FINDINGS: CT HEAD FINDINGS Brain: Prominence of the lateral ventricles may be related to central predominant atrophy, although a component of normal pressure/communicating hydrocephalus cannot be excluded. Patchy and confluent areas of decreased attenuation are noted throughout the deep and periventricular white matter of the cerebral hemispheres bilaterally, compatible with chronic microvascular ischemic disease. no evidence of large-territorial acute infarction. No parenchymal hemorrhage. No mass lesion. No extra-axial collection. No mass effect or midline shift. No hydrocephalus. Basilar cisterns are patent. Vascular: No hyperdense vessel. Skull: No acute fracture or focal lesion. Sinuses/Orbits:  Bilateral maxillary sinus mucosal thickening. Bilateral sphenoid sinus mucosal thickening with hypertrophy of the sphenoid sinus walls. Otherwise paranasal sinuses and mastoid air cells are clear. The orbits are unremarkable. Other: None. CT CERVICAL SPINE FINDINGS Alignment: Grade 1 anterolisthesis of C3 on C4. Skull base and vertebrae: Multilevel severe degenerative changes of the spine. Associated multilevel moderate to severe osseous neural foraminal stenosis. No severe osseous central canal stenosis. No acute fracture. No aggressive appearing focal osseous lesion or focal pathologic process. Soft tissues and spinal canal: No prevertebral fluid or swelling. No visible canal hematoma. Upper chest: Right apical subpleural micronodule. Other: None. IMPRESSION: 1. No acute intracranial abnormality. 2. No acute displaced fracture or traumatic listhesis of the cervical spine. 3. Prominence of the lateral ventricles may be related to central predominant atrophy, although a component of normal pressure/communicating hydrocephalus cannot be excluded. 4. Multilevel severe degenerative changes of the spine. Associated multilevel moderate to severe osseous neural foraminal stenosis. No severe osseous central canal stenosis. Electronically Signed   By: Morgane  Naveau M.D.   On: 03/10/2024 17:38   CT Cervical Spine Wo Contrast Result Date: 03/10/2024 CLINICAL DATA:  Head trauma, moderate-severe; Neck trauma (Age >= 65y) t was found by family "slid out of her chair." Pt complaining of left hip pain. Pt able to move both legs. EMS gave 100 mcg fentanyl  and 4 mg zofran  en route. Pt still rates pain 10/10 at this time. EXAM: CT HEAD WITHOUT CONTRAST CT CERVICAL SPINE WITHOUT CONTRAST TECHNIQUE: Multidetector CT imaging of the head and cervical spine was performed following the standard protocol without intravenous contrast. Multiplanar CT image reconstructions of the cervical spine were also generated. RADIATION DOSE  REDUCTION: This exam was performed according to the departmental dose-optimization program which includes automated exposure control, adjustment of the mA and/or kV according to patient size and/or use of iterative reconstruction technique. COMPARISON:  None Available. FINDINGS: CT HEAD FINDINGS Brain: Prominence of the lateral ventricles may be related to central predominant atrophy, although a component of normal pressure/communicating hydrocephalus cannot be excluded. Patchy and confluent areas of decreased attenuation are noted throughout the deep and periventricular white matter of the cerebral hemispheres bilaterally, compatible with chronic microvascular ischemic disease. no evidence of large-territorial acute infarction. No parenchymal hemorrhage. No mass lesion. No extra-axial collection. No mass effect or midline shift. No hydrocephalus. Basilar cisterns are patent. Vascular: No hyperdense vessel. Skull: No acute fracture or focal lesion. Sinuses/Orbits: Bilateral maxillary sinus mucosal thickening. Bilateral sphenoid  sinus mucosal thickening with hypertrophy of the sphenoid sinus walls. Otherwise paranasal sinuses and mastoid air cells are clear. The orbits are unremarkable. Other: None. CT CERVICAL SPINE FINDINGS Alignment: Grade 1 anterolisthesis of C3 on C4. Skull base and vertebrae: Multilevel severe degenerative changes of the spine. Associated multilevel moderate to severe osseous neural foraminal stenosis. No severe osseous central canal stenosis. No acute fracture. No aggressive appearing focal osseous lesion or focal pathologic process. Soft tissues and spinal canal: No prevertebral fluid or swelling. No visible canal hematoma. Upper chest: Right apical subpleural micronodule. Other: None. IMPRESSION: 1. No acute intracranial abnormality. 2. No acute displaced fracture or traumatic listhesis of the cervical spine. 3. Prominence of the lateral ventricles may be related to central predominant  atrophy, although a component of normal pressure/communicating hydrocephalus cannot be excluded. 4. Multilevel severe degenerative changes of the spine. Associated multilevel moderate to severe osseous neural foraminal stenosis. No severe osseous central canal stenosis. Electronically Signed   By: Morgane  Naveau M.D.   On: 03/10/2024 17:38    Procedures Procedures    Medications Ordered in ED Medications  HYDROcodone -acetaminophen  (NORCO/VICODIN) 5-325 MG per tablet 2 tablet (has no administration in time range)    ED Course/ Medical Decision Making/ A&P Clinical Course as of 03/10/24 2113  Thu Mar 10, 2024  1934 Patient was able to walk assisted to the bathroom.  She states that her hip is overall feeling better.  However, given the findings of the hip x-ray we will proceed with a CT scan of the left hip.  Family at bedside is agreeable with plan.  Went over the rest of her labs and imaging with family at the bedside.  All questions or concerns addressed. [CF]    Clinical Course User Index [CF] Darletta Ehrich, PA-C   {   Click here for ABCD2, HEART and other calculators  Medical Decision Making Erin Novak is a 83 y.o. female patient who presents to the emergency department today for further evaluation of left hip pain secondary to a fall.  Given that the patient fell and is not on the floor for an unknown amount of time we will get a CK level and a lactic.  I will also scan her head and neck.  Will also get repeat x-ray of the left hip.  Also get some basic labs as well.  Patient is confused but this is at her baseline per the daughter.  CT scan of the hip was normal.  All the rest of her imaging looked good.  Patient having less hip pain at this time.  Will likely send her home with some breakthrough medication for her hip if necessary for tomorrow per family's request.  They will follow-up with her primary care doctor tomorrow.  Strict return precautions were discussed.  She is  safe for discharge.   Amount and/or Complexity of Data Reviewed Labs: ordered. Radiology: ordered.  Risk Prescription drug management.    Final Clinical Impression(s) / ED Diagnoses Final diagnoses:  Fall, initial encounter  Left hip amputee    Rx / DC Orders ED Discharge Orders          Ordered    HYDROcodone -acetaminophen  (NORCO/VICODIN) 5-325 MG tablet  Every 4 hours PRN        03/10/24 2111              Darletta Ehrich, PA-C 03/10/24 2113    Teddi Favors, DO 03/12/24 [phone]

## 2024-03-10 NOTE — ED Notes (Signed)
 Pt assisted out of bed to bathroom. Pt now back to bed. Daughters at bedside.

## 2024-03-11 ENCOUNTER — Telehealth: Payer: Self-pay | Admitting: *Deleted

## 2024-03-11 NOTE — Progress Notes (Signed)
 Complex Care Management Note  Care Guide Note 03/11/2024 Name: Kaimana Neuzil MRN: [id number] DOB: May 02, 1941  Kevon Pellegrini JOZI MALACHI is a 83 y.o. year old female who sees Pete Brand, DO for primary care. I reached out to Lynnetta Sauce by phone today to offer complex care management services.  Ms. Khalid was given information about Complex Care Management services today including:   The Complex Care Management services include support from the care team which includes your Nurse Care Manager, Clinical Social Worker, or Pharmacist.  The Complex Care Management team is here to help remove barriers to the health concerns and goals most important to you. Complex Care Management services are voluntary, and the patient may decline or stop services at any time by request to their care team member.   Complex Care Management Consent Status: Patient agreed to services and verbal consent obtained.   Follow up plan:  Telephone appointment with complex care management team member scheduled for:  03/21/2024  Encounter Outcome:  Patient Scheduled  Kandis Ormond, CMA [place]  [facility], [facility] Guide Direct Dial: [phone]  Fax: [phone] Website: [email or web address]

## 2024-03-12 ENCOUNTER — Encounter (HOSPITAL_BASED_OUTPATIENT_CLINIC_OR_DEPARTMENT_OTHER): Payer: Self-pay | Admitting: Emergency Medicine

## 2024-03-12 ENCOUNTER — Emergency Department (HOSPITAL_BASED_OUTPATIENT_CLINIC_OR_DEPARTMENT_OTHER)

## 2024-03-12 ENCOUNTER — Inpatient Hospital Stay (HOSPITAL_BASED_OUTPATIENT_CLINIC_OR_DEPARTMENT_OTHER)
Admission: EM | Admit: 2024-03-12 | Discharge: 2024-03-15 | DRG: 683 | Disposition: A | Discharge status: Skilled Nursing Facility | Attending: Internal Medicine | Admitting: Internal Medicine

## 2024-03-12 ENCOUNTER — Other Ambulatory Visit: Payer: Self-pay

## 2024-03-12 DIAGNOSIS — I5022 Chronic systolic (congestive) heart failure: Secondary | ICD-10-CM | POA: Diagnosis present

## 2024-03-12 DIAGNOSIS — R296 Repeated falls: Secondary | ICD-10-CM | POA: Diagnosis present

## 2024-03-12 DIAGNOSIS — Z90710 Acquired absence of both cervix and uterus: Secondary | ICD-10-CM

## 2024-03-12 DIAGNOSIS — K449 Diaphragmatic hernia without obstruction or gangrene: Secondary | ICD-10-CM | POA: Diagnosis present

## 2024-03-12 DIAGNOSIS — Y92013 Bedroom of single-family (private) house as the place of occurrence of the external cause: Secondary | ICD-10-CM

## 2024-03-12 DIAGNOSIS — Z751 Person awaiting admission to adequate facility elsewhere: Secondary | ICD-10-CM

## 2024-03-12 DIAGNOSIS — R519 Headache, unspecified: Secondary | ICD-10-CM | POA: Diagnosis not present

## 2024-03-12 DIAGNOSIS — I428 Other cardiomyopathies: Secondary | ICD-10-CM

## 2024-03-12 DIAGNOSIS — N179 Acute kidney failure, unspecified: Principal | ICD-10-CM

## 2024-03-12 DIAGNOSIS — I480 Paroxysmal atrial fibrillation: Secondary | ICD-10-CM

## 2024-03-12 DIAGNOSIS — Z79899 Other long term (current) drug therapy: Secondary | ICD-10-CM

## 2024-03-12 DIAGNOSIS — Z923 Personal history of irradiation: Secondary | ICD-10-CM

## 2024-03-12 DIAGNOSIS — E785 Hyperlipidemia, unspecified: Secondary | ICD-10-CM | POA: Diagnosis present

## 2024-03-12 DIAGNOSIS — Z825 Family history of asthma and other chronic lower respiratory diseases: Secondary | ICD-10-CM

## 2024-03-12 DIAGNOSIS — R6510 Systemic inflammatory response syndrome (SIRS) of non-infectious origin without acute organ dysfunction: Secondary | ICD-10-CM | POA: Diagnosis not present

## 2024-03-12 DIAGNOSIS — I130 Hypertensive heart and chronic kidney disease with heart failure and stage 1 through stage 4 chronic kidney disease, or unspecified chronic kidney disease: Secondary | ICD-10-CM | POA: Diagnosis present

## 2024-03-12 DIAGNOSIS — A419 Sepsis, unspecified organism: Secondary | ICD-10-CM | POA: Diagnosis not present

## 2024-03-12 DIAGNOSIS — M4807 Spinal stenosis, lumbosacral region: Secondary | ICD-10-CM | POA: Diagnosis not present

## 2024-03-12 DIAGNOSIS — N189 Chronic kidney disease, unspecified: Secondary | ICD-10-CM | POA: Diagnosis not present

## 2024-03-12 DIAGNOSIS — E039 Hypothyroidism, unspecified: Secondary | ICD-10-CM | POA: Diagnosis present

## 2024-03-12 DIAGNOSIS — J811 Chronic pulmonary edema: Secondary | ICD-10-CM | POA: Diagnosis not present

## 2024-03-12 DIAGNOSIS — Z950 Presence of cardiac pacemaker: Secondary | ICD-10-CM

## 2024-03-12 DIAGNOSIS — Z885 Allergy status to narcotic agent status: Secondary | ICD-10-CM

## 2024-03-12 DIAGNOSIS — I340 Nonrheumatic mitral (valve) insufficiency: Secondary | ICD-10-CM | POA: Diagnosis present

## 2024-03-12 DIAGNOSIS — Z9049 Acquired absence of other specified parts of digestive tract: Secondary | ICD-10-CM

## 2024-03-12 DIAGNOSIS — Z881 Allergy status to other antibiotic agents status: Secondary | ICD-10-CM

## 2024-03-12 DIAGNOSIS — Z888 Allergy status to other drugs, medicaments and biological substances status: Secondary | ICD-10-CM

## 2024-03-12 DIAGNOSIS — W19XXXA Unspecified fall, initial encounter: Secondary | ICD-10-CM | POA: Diagnosis present

## 2024-03-12 DIAGNOSIS — K5732 Diverticulitis of large intestine without perforation or abscess without bleeding: Secondary | ICD-10-CM | POA: Diagnosis not present

## 2024-03-12 DIAGNOSIS — S3992XA Unspecified injury of lower back, initial encounter: Secondary | ICD-10-CM | POA: Diagnosis not present

## 2024-03-12 DIAGNOSIS — Z79811 Long term (current) use of aromatase inhibitors: Secondary | ICD-10-CM

## 2024-03-12 DIAGNOSIS — Z853 Personal history of malignant neoplasm of breast: Secondary | ICD-10-CM

## 2024-03-12 DIAGNOSIS — M1612 Unilateral primary osteoarthritis, left hip: Secondary | ICD-10-CM | POA: Diagnosis present

## 2024-03-12 DIAGNOSIS — K5792 Diverticulitis of intestine, part unspecified, without perforation or abscess without bleeding: Secondary | ICD-10-CM | POA: Diagnosis present

## 2024-03-12 DIAGNOSIS — Z79890 Hormone replacement therapy (postmenopausal): Secondary | ICD-10-CM

## 2024-03-12 DIAGNOSIS — E869 Volume depletion, unspecified: Secondary | ICD-10-CM | POA: Diagnosis present

## 2024-03-12 DIAGNOSIS — M47816 Spondylosis without myelopathy or radiculopathy, lumbar region: Secondary | ICD-10-CM | POA: Diagnosis not present

## 2024-03-12 DIAGNOSIS — S0990XA Unspecified injury of head, initial encounter: Secondary | ICD-10-CM | POA: Diagnosis not present

## 2024-03-12 DIAGNOSIS — F0390 Unspecified dementia without behavioral disturbance: Secondary | ICD-10-CM | POA: Diagnosis present

## 2024-03-12 DIAGNOSIS — Z8711 Personal history of peptic ulcer disease: Secondary | ICD-10-CM

## 2024-03-12 DIAGNOSIS — N1832 Chronic kidney disease, stage 3b: Secondary | ICD-10-CM | POA: Diagnosis present

## 2024-03-12 DIAGNOSIS — I10 Essential (primary) hypertension: Secondary | ICD-10-CM | POA: Diagnosis present

## 2024-03-12 LAB — BASIC METABOLIC PANEL WITH GFR
Anion gap: 14 (ref 5–15)
BUN: 60 mg/dL — ABNORMAL HIGH (ref 8–23)
CO2: 23 mmol/L (ref 22–32)
Calcium: 9.9 mg/dL (ref 8.9–10.3)
Chloride: 100 mmol/L (ref 98–111)
Creatinine, Ser: 2.46 mg/dL — ABNORMAL HIGH (ref 0.44–1.00)
GFR, Estimated: 19 mL/min — ABNORMAL LOW (ref >60)
Glucose, Bld: 108 mg/dL — ABNORMAL HIGH (ref 70–99)
Potassium: 4 mmol/L (ref 3.5–5.1)
Sodium: 137 mmol/L (ref 135–145)

## 2024-03-12 LAB — URINALYSIS, ROUTINE W REFLEX MICROSCOPIC
Bacteria, UA: NONE SEEN
Bilirubin Urine: NEGATIVE
Glucose, UA: NEGATIVE mg/dL
Hgb urine dipstick: NEGATIVE
Ketones, ur: NEGATIVE mg/dL
Nitrite: NEGATIVE
Protein, ur: NEGATIVE mg/dL
Specific Gravity, Urine: 1.019 (ref 1.005–1.030)
pH: 5 (ref 5.0–8.0)

## 2024-03-12 LAB — CBC
HCT: 44.1 % (ref 36.0–46.0)
Hemoglobin: 14.8 g/dL (ref 12.0–15.0)
MCH: 30.1 pg (ref 26.0–34.0)
MCHC: 33.6 g/dL (ref 30.0–36.0)
MCV: 89.6 fL (ref 80.0–100.0)
Platelets: 160 10*3/uL (ref 150–400)
RBC: 4.92 MIL/uL (ref 3.87–5.11)
RDW: 13.2 % (ref 11.5–15.5)
WBC: 14.2 10*3/uL — ABNORMAL HIGH (ref 4.0–10.5)
nRBC: 0 % (ref 0.0–0.2)

## 2024-03-12 LAB — LACTIC ACID, PLASMA: Lactic Acid, Venous: 1.5 mmol/L (ref 0.5–1.9)

## 2024-03-12 MED ORDER — ALBUTEROL SULFATE (2.5 MG/3ML) 0.083% IN NEBU: 2.5000 mg | INHALATION_SOLUTION | RESPIRATORY_TRACT | Status: DC | PRN

## 2024-03-12 MED ORDER — METOPROLOL TARTRATE 50 MG PO TABS: 50.0000 mg | ORAL_TABLET | Freq: Two times a day (BID) | ORAL | Status: DC

## 2024-03-12 MED ORDER — SODIUM CHLORIDE 0.9 % IV SOLN
2.0000 g | INTRAVENOUS | Status: DC
  Administered 2024-03-13 – 2024-03-14 (×2): 2 g via INTRAVENOUS
  Filled 2024-03-12 (×2): qty 20

## 2024-03-12 MED ORDER — ACETAMINOPHEN 500 MG PO TABS
1000.0000 mg | ORAL_TABLET | Freq: Once | ORAL | Status: AC
  Administered 2024-03-12: 1000 mg via ORAL
  Filled 2024-03-12: qty 2

## 2024-03-12 MED ORDER — LACTATED RINGERS IV BOLUS
500.0000 mL | Freq: Once | INTRAVENOUS | Status: AC
  Administered 2024-03-12: 500 mL via INTRAVENOUS

## 2024-03-12 MED ORDER — HEPARIN SODIUM (PORCINE) 5000 UNIT/ML IJ SOLN
5000.0000 [IU] | Freq: Three times a day (TID) | INTRAMUSCULAR | Status: DC
  Administered 2024-03-13 – 2024-03-15 (×8): 5000 [IU] via SUBCUTANEOUS
  Filled 2024-03-12 (×8): qty 1

## 2024-03-12 MED ORDER — ADULT MULTIVITAMIN W/MINERALS CH
1.0000 | ORAL_TABLET | Freq: Every day | ORAL | Status: DC
  Administered 2024-03-13 – 2024-03-15 (×3): 1 via ORAL
  Filled 2024-03-12 (×3): qty 1

## 2024-03-12 MED ORDER — METOPROLOL TARTRATE 12.5 MG HALF TABLET: 12.5000 mg | ORAL_TABLET | Freq: Two times a day (BID) | ORAL | Status: DC

## 2024-03-12 MED ORDER — SODIUM CHLORIDE 0.9 % IV SOLN
2.0000 g | Freq: Once | INTRAVENOUS | Status: AC
  Administered 2024-03-12: 2 g via INTRAVENOUS
  Filled 2024-03-12: qty 20

## 2024-03-12 MED ORDER — AMLODIPINE BESYLATE 5 MG PO TABS: 5.0000 mg | ORAL_TABLET | Freq: Every day | ORAL | Status: DC

## 2024-03-12 MED ORDER — ONDANSETRON HCL 4 MG PO TABS: 4.0000 mg | ORAL_TABLET | Freq: Four times a day (QID) | ORAL | Status: DC | PRN

## 2024-03-12 MED ORDER — ATORVASTATIN CALCIUM 10 MG PO TABS: 10.0000 mg | ORAL_TABLET | Freq: Every day | ORAL | Status: DC

## 2024-03-12 MED ORDER — ONDANSETRON HCL 4 MG/2ML IJ SOLN
4.0000 mg | Freq: Four times a day (QID) | INTRAMUSCULAR | Status: DC | PRN
Start: 2024-03-12 — End: 2024-03-16

## 2024-03-12 MED ORDER — METRONIDAZOLE 500 MG/100ML IV SOLN
500.0000 mg | Freq: Once | INTRAVENOUS | Status: AC
  Administered 2024-03-12: 500 mg via INTRAVENOUS
  Filled 2024-03-12: qty 100

## 2024-03-12 MED ORDER — SENNA 8.6 MG PO TABS: 1.0000 | ORAL_TABLET | Freq: Two times a day (BID) | ORAL | Status: DC

## 2024-03-12 MED ORDER — ATORVASTATIN CALCIUM 10 MG PO TABS
10.0000 mg | ORAL_TABLET | Freq: Every evening | ORAL | Status: DC
Start: 2024-03-13 — End: 2024-03-13

## 2024-03-12 MED ORDER — LIDOCAINE 5 % EX PTCH
1.0000 | MEDICATED_PATCH | Freq: Once | CUTANEOUS | Status: AC
Start: 2024-03-12 — End: 2024-03-13
  Administered 2024-03-12: 1 via TRANSDERMAL
  Filled 2024-03-12: qty 1

## 2024-03-12 MED ORDER — METRONIDAZOLE 500 MG/100ML IV SOLN
500.0000 mg | Freq: Two times a day (BID) | INTRAVENOUS | Status: DC
  Administered 2024-03-13 – 2024-03-15 (×5): 500 mg via INTRAVENOUS
  Filled 2024-03-12 (×5): qty 100

## 2024-03-12 MED ORDER — METOPROLOL TARTRATE 25 MG PO TABS: 25.0000 mg | ORAL_TABLET | Freq: Two times a day (BID) | ORAL | Status: DC

## 2024-03-12 MED ORDER — ACETAMINOPHEN 325 MG PO TABS
650.0000 mg | ORAL_TABLET | Freq: Four times a day (QID) | ORAL | Status: DC | PRN
Start: 2024-03-12 — End: 2024-03-16
  Administered 2024-03-14 – 2024-03-15 (×2): 650 mg via ORAL
  Filled 2024-03-12 (×2): qty 2

## 2024-03-12 MED ORDER — DOCUSATE SODIUM 100 MG PO CAPS: 100.0000 mg | ORAL_CAPSULE | Freq: Two times a day (BID) | ORAL | Status: DC

## 2024-03-12 MED ORDER — FENTANYL CITRATE PF 50 MCG/ML IJ SOSY
50.0000 ug | PREFILLED_SYRINGE | Freq: Once | INTRAMUSCULAR | Status: AC
  Administered 2024-03-12: 50 ug via INTRAVENOUS
  Filled 2024-03-12: qty 1

## 2024-03-12 NOTE — ED Notes (Signed)
 Called for transport spoke with ruby at 9:34p

## 2024-03-12 NOTE — ED Notes (Signed)
 Patient returned from CT.  Much calmer since given fentanyl 

## 2024-03-12 NOTE — ED Triage Notes (Signed)
 Pt brought in by family. Pt was lying in the back of the car on a blanket. Staff transferred pt to stretcher and placed in room 11. Daughter states she was seen prior at [facility] for hip pain, after sliding out of her chair . Still with pain.

## 2024-03-12 NOTE — Progress Notes (Signed)
 Plan of Care Note for accepted transfer   Patient: Erin Novak MRN: [id number]   DOA: 03/12/2024  Facility requesting transfer: [facility] Requesting Provider: Rolinda Climes, DO Reason for transfer: Acute diverticulitis Facility course: Delaila Giarraputo is a 83 y.o. female, with history of essential hypertension, paroxysmal atrial fibrillation, chronic systolic CHF, nonischemic cardiomyopathy, nonrheumatic mitral valve regurgitation, GERD, hypothyroidism and dementia as well as stage IIIb chronic kidney disease, presenting to the emergency department with low back pain and left hip pain after a fall on 4/24.  Found again today on the floor by family.  Unclear if she hit her head.  Family at bedside notes that she has been unable to ambulate without significant assistance or significant pain.  Has been given hydrocodone  for pain, but not helping.  Denies numbness tingling changes in sensation.  No bowel or bladder dysfunction.  No saddle anesthesia.  Pain largely localizes to the left hip, but also complains of some low back.  Upon presentation to the emergency room BP was 124/57 with tension of 97 and otherwise unremarkable vital signs.  Labs revealed a BUN of 60 and creatinine 2.46 up from 48/1.56 on 4/24 and 14/1.18 on/11/25.  CBC showed leukocytosis of 14.2 up from 12.4 a couple of days ago.  Noncontrasted head CT scan showed the following: IMPRESSION: 1. No acute intracranial abnormality. 2. Stable prominence of the lateral ventricles may be related to central predominant atrophy, although a component of normal pressure/communicating hydrocephalus cannot be excluded. 3. 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 Maxillary sinus disease.  Abdominal and pelvic CT scan without contrast revealed the following: 1. Colonic diverticulosis with acute mild uncomplicated mid transverse colon diverticulitis. 2. No acute intra-abdominal or intrapelvic traumatic injury with limited evaluation  on this noncontrast study. 3. No hip or pelvic fracture. 4. Please see separately dictated CT lumbar spine 03/12/2024. 5. Small to moderate volume hiatal hernia. 6.  Aortic Atherosclerosis   The patient was given 1 g of p.o. Tylenol , 2 g of IV Rocephin, 50 mcg of IV fentanyl , 1 L bolus of IV normal saline, Lidoderm  patch and 500 mg of IV Flagyl.  She will be admitted to an observation medical-surgical bed at [facility] further evaluation and management.  Plan of care: The patient is accepted for admission to Med-surg  unit, at [facility]..  The patient will be under the care and responsibility of the EDP until arrival to [facility].  Author: Virgene Griffin, MD 03/12/2024  Check [email or web address] for on-call coverage.  Nursing staff, Please call [facility] Admits & Consults System-Wide number on Amion as soon as patient's arrival, so appropriate admitting provider can evaluate the pt.

## 2024-03-12 NOTE — ED Provider Notes (Signed)
 [place] EMERGENCY DEPARTMENT AT [facility] Provider Note   CSN: [id number] Arrival date & time: 03/12/24  1414     History  No chief complaint on file.   Erin Novak is a 83 y.o. female.  This is an 83 year old female presenting emergency department with low back pain and left hip pain after a fall on 4/24.  Found again today on the floor by family.  Unclear if she hit her head.  Family at bedside notes that she has been unable to ambulate without significant assistance or significant pain.  Has been given hydrocodone  for pain, but not helping.  Denies numbness tingling changes in sensation.  No bowel or bladder dysfunction.  No saddle anesthesia.  Pain largely localizes to the left hip, but also complains of some low back.        Home Medications Prior to Admission medications   Medication Sig Start Date End Date Taking? Authorizing Provider  acetaminophen  (TYLENOL ) 500 MG tablet Take 1,000 mg by mouth every 6 (six) hours as needed for mild pain (pain score 1-3).    [provider]  amLODipine  (NORVASC ) 10 MG tablet Take 1 tablet (10 mg total) by mouth daily. 02/17/24   Patwardhan, Kaye Parsons, MD  anastrozole  (ARIMIDEX ) 1 MG tablet TAKE 1 TABLET BY MOUTH DAILY 04/15/23   Patwardhan, Manish J, MD  atorvastatin  (LIPITOR) 10 MG tablet TAKE 1 TABLET BY MOUTH DAILY 03/07/24   Patwardhan, Manish J, MD  furosemide  (LASIX ) 40 MG tablet TAKE 1 TABLET BY MOUTH DAILY 03/07/24   Patwardhan, Manish J, MD  HYDROcodone -acetaminophen  (NORCO/VICODIN) 5-325 MG tablet Take 1 tablet by mouth every 4 (four) hours as needed. 03/10/24   Angelyn Kennel M, PA-C  levothyroxine  (SYNTHROID ) 88 MCG tablet TAKE 1 TABLET BY MOUTH IN THE  MORNING 05/22/23   Patwardhan, Manish J, MD  loratadine  (CLARITIN ) 10 MG tablet Take 10 mg by mouth daily.    [provider]  memantine (NAMENDA) 10 MG tablet Take 10 mg by mouth 2 (two) times daily. 03/09/24   [provider]  metoprolol   succinate (TOPROL -XL) 100 MG 24 hr tablet TAKE 1 TABLET BY MOUTH ONCE  DAILY WITH MEAL OR IMMEDIATELY  FOLLOWING 03/07/24   Patwardhan, Kaye Parsons, MD  sacubitril -valsartan  (ENTRESTO ) 97-103 MG Take 1 tablet by mouth 2 (two) times daily. 09/15/23   Patwardhan, Kaye Parsons, MD  spironolactone  (ALDACTONE ) 50 MG tablet TAKE 1 TABLET BY MOUTH DAILY 06/19/23   Patwardhan, Kaye Parsons, MD      Allergies    Percodan [oxycodone -aspirin ], Prednisone, Amoxicillin-pot clavulanate, and Lisinopril    Review of Systems   Review of Systems  Physical Exam Updated Vital Signs BP (!) 121/96 (BP Location: Right Arm)   Pulse 69   Temp (!) 97.4 F (36.3 C)   Resp 16   SpO2 98%  Physical Exam Vitals and nursing note reviewed.  Constitutional:      General: She is not in acute distress.    Appearance: She is not toxic-appearing.  HENT:     Head: Normocephalic.     Nose: Nose normal.  Eyes:     Conjunctiva/sclera: Conjunctivae normal.  Cardiovascular:     Rate and Rhythm: Normal rate and regular rhythm.  Pulmonary:     Effort: Pulmonary effort is normal.     Breath sounds: Normal breath sounds.  Abdominal:     General: Abdomen is flat. There is no distension.     Palpations: Abdomen is soft.  Tenderness: There is no abdominal tenderness. There is no guarding or rebound.  Musculoskeletal:     Comments: Patient with 5 out of 5 plantarflexion dorsiflexion.  Able to lift both legs off bed and hold for 3 seconds.  Full passive range of motion to hips.  No midline spinal tenderness.  Normal sensation in lower extremities.  Skin:    General: Skin is warm and dry.     Capillary Refill: Capillary refill takes less than 2 seconds.  Neurological:     Mental Status: She is alert. Mental status is at baseline.  Psychiatric:        Mood and Affect: Mood normal.        Behavior: Behavior normal.     ED Results / Procedures / Treatments   Labs (all labs ordered are listed, but only abnormal results are  displayed) Labs Reviewed  CBC - Abnormal; Notable for the following components:      Result Value   WBC 14.2 (*)    All other components within normal limits  BASIC METABOLIC PANEL WITH GFR - Abnormal; Notable for the following components:   Glucose, Bld 108 (*)    BUN 60 (*)    Creatinine, Ser 2.46 (*)    GFR, Estimated 19 (*)    All other components within normal limits  URINALYSIS, ROUTINE W REFLEX MICROSCOPIC - Abnormal; Notable for the following components:   Leukocytes,Ua SMALL (*)    All other components within normal limits  LACTIC ACID, PLASMA    EKG None  Radiology CT L-SPINE NO CHARGE Result Date: 03/12/2024 CLINICAL DATA:  Back trauma, no prior imaging (Age >= 16y) EXAM: CT LUMBAR SPINE WITHOUT CONTRAST TECHNIQUE: Multidetector CT imaging of the lumbar spine was performed without intravenous contrast administration. Multiplanar CT image reconstructions were also generated. RADIATION DOSE REDUCTION: This exam was performed according to the departmental dose-optimization program which includes automated exposure control, adjustment of the mA and/or kV according to patient size and/or use of iterative reconstruction technique. COMPARISON:  CT abdomen pelvis 03/12/2024, CT left hip 03/10/2024 FINDINGS: Segmentation: 5 lumbar type vertebrae. Alignment: Normal. Vertebrae: Multilevel severe degenerative changes of the spine with endplate sclerosis at the L1-L2, L4-L5, L5-S1 levels. Associated posterior disc osteophyte complex formation at the L1-L2, L4-L5, L5-S1 levels. At least moderate osseous neural stenosis at the L5-S1 level. No severe central canal stenosis. No acute fracture or focal pathologic process. Paraspinal and other soft tissues: Negative. Disc levels: Multilevel intervertebral disc space vacuum phenomenon. IMPRESSION: 1. No acute displaced fracture or traumatic listhesis of the lumbar spine. 2. Please see separately dictated CT abdomen pelvis 03/12/2024. Electronically  Signed   By: Morgane  Naveau M.D.   On: 03/12/2024 19:12   CT ABDOMEN PELVIS WO CONTRAST Result Date: 03/12/2024 CLINICAL DATA:  Sepsis.  Complaining of left hip pain.  Fall recent EXAM: CT ABDOMEN AND PELVIS WITHOUT CONTRAST TECHNIQUE: Multidetector CT imaging of the abdomen and pelvis was performed following the standard protocol without IV contrast. RADIATION DOSE REDUCTION: This exam was performed according to the departmental dose-optimization program which includes automated exposure control, adjustment of the mA and/or kV according to patient size and/or use of iterative reconstruction technique. COMPARISON:  None Available. FINDINGS: Lower chest: Right base atelectasis. Cardiac leads noted. Small to moderate volume hiatal hernia. Hepatobiliary: Not enlarged. No focal lesion. Status post cholecystectomy.  No biliary ductal dilatation. Pancreas: Normal pancreatic contour. No main pancreatic duct dilatation. Spleen: Not enlarged. No focal lesion. Splenule noted. Adrenals/Urinary Tract: No  nodularity bilaterally. At least partially duplicated right collecting system. No hydroureteronephrosis. No nephroureterolithiasis. No contour deforming renal mass. The urinary bladder is unremarkable. Stomach/Bowel: No small bowel wall thickening or dilatation. No large bowel dilatation. Colonic diverticulosis. Mild focal diverticular wall thickening and pericolonic fat stranding along a mid transverse colon diverticula (2:44). The appendix is unremarkable. Vasculature/Lymphatic: Atherosclerotic plaque.  No abdominal aorta or iliac aneurysm. No abdominal, pelvic, inguinal lymphadenopathy. Reproductive: Status post hysterectomy.  No adnexal mass identified. Other: No simple free fluid ascites. No pneumoperitoneum. No mesenteric hematoma identified. No organized fluid collection. Musculoskeletal: No significant soft tissue hematoma. No acute pelvic fracture. Please see separately dictated CT lumbar spine 03/12/2024. Other  ports and devices: None. Other ports and devices: None. IMPRESSION: 1. Colonic diverticulosis with acute mild uncomplicated mid transverse colon diverticulitis. 2. No acute intra-abdominal or intrapelvic traumatic injury with limited evaluation on this noncontrast study. 3. No hip or pelvic fracture. 4. Please see separately dictated CT lumbar spine 03/12/2024. 5. Small to moderate volume hiatal hernia. 6.  Aortic Atherosclerosis (ICD10-I70.0). Electronically Signed   By: Morgane  Naveau M.D.   On: 03/12/2024 19:09   DG Chest Portable 1 View Result Date: 03/12/2024 CLINICAL DATA:  SIRS Pt brought in by family. Pt was lying in the back of the car on a blanket. Staff transferred pt to stretcher and placed in room 11. Daughter states she was seen prior at [facility] for hip pain, after sliding out of her chair . Still with pain EXAM: PORTABLE CHEST 1 VIEW COMPARISON:  Chest x-ray 10/30/2018, chest x-ray 12/23/2011, CT chest 10/22/2007 FINDINGS: Left chest wall triple lead pacemaker. The heart and mediastinal contours are within normal limits. Mildly prominent hilar vasculature. No focal consolidation. Increased interstitial markings. No pleural effusion. No pneumothorax. No acute osseous abnormality. IMPRESSION: Mild pulmonary edema. Electronically Signed   By: Morgane  Naveau M.D.   On: 03/12/2024 18:58   CT Head Wo Contrast Result Date: 03/12/2024 CLINICAL DATA:  Head trauma, minor (Age >= 65y) Headache, neuro deficit. Left hip pain EXAM: CT HEAD WITHOUT CONTRAST TECHNIQUE: Contiguous axial images were obtained from the base of the skull through the vertex without intravenous contrast. RADIATION DOSE REDUCTION: This exam was performed according to the departmental dose-optimization program which includes automated exposure control, adjustment of the mA and/or kV according to patient size and/or use of iterative reconstruction technique. COMPARISON:  CT head 03/10/2024 FINDINGS: Brain: Stable prominence of the  lateral ventricles may be related to central predominant atrophy, although a component of normal pressure/communicating hydrocephalus cannot be excluded. Patchy and confluent areas of decreased attenuation are noted throughout the deep and periventricular white matter of the cerebral hemispheres bilaterally, compatible with chronic microvascular ischemic disease. No evidence of large-territorial acute infarction. No parenchymal hemorrhage. No mass lesion. No extra-axial collection. No mass effect or midline shift. No hydrocephalus. Basilar cisterns are patent. Vascular: No hyperdense vessel. Atherosclerotic calcifications are present within the cavernous internal carotid arteries. Skull: No acute fracture or focal lesion. Sinuses/Orbits: Bilateral sphenoid sinus mucosal thickening. Bilateral sphenoid sinus wall hypertrophy. Bilateral maxillary sinus mucosal thickening. Otherwise paranasal sinuses and mastoid air cells are clear. The orbits are unremarkable. Other: None. IMPRESSION: 1. No acute intracranial abnormality. 2. Stable prominence of the lateral ventricles may be related to central predominant atrophy, although a component of normal pressure/communicating hydrocephalus cannot be excluded. 3. 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 Maxillary sinus disease. Electronically Signed   By: Morgane  Naveau M.D.   On: 03/12/2024 18:55  Procedures Procedures    Medications Ordered in ED Medications  lidocaine  (LIDODERM ) 5 % 1 patch (1 patch Transdermal Patch Applied 03/12/24 1634)  lactated ringers  bolus 500 mL (0 mLs Intravenous Stopped 03/12/24 1743)  acetaminophen  (TYLENOL ) tablet 1,000 mg (1,000 mg Oral Given 03/12/24 1740)  fentaNYL  (SUBLIMAZE ) injection 50 mcg (50 mcg Intravenous Given 03/12/24 1741)  lactated ringers  bolus 500 mL (0 mLs Intravenous Stopped 03/12/24 1928)  metroNIDAZOLE (FLAGYL) IVPB 500 mg (0 mg Intravenous Stopped 03/12/24 2108)  cefTRIAXone (ROCEPHIN) 2 g in sodium chloride  0.9 %  100 mL IVPB (0 g Intravenous Stopped 03/12/24 2052)    ED Course/ Medical Decision Making/ A&P Clinical Course as of 03/12/24 2256  Sat Mar 12, 2024  1525 Echo in 2022 with normal EF [TY]  1945 CT ABDOMEN PELVIS WO CONTRAST IMPRESSION: 1. Colonic diverticulosis with acute mild uncomplicated mid transverse colon diverticulitis. 2. No acute intra-abdominal or intrapelvic traumatic injury with limited evaluation on this noncontrast study. 3. No hip or pelvic fracture. 4. Please see separately dictated CT lumbar spine 03/12/2024. 5. Small to moderate volume hiatal hernia. 6.  Aortic Atherosclerosis (ICD10-I70.0).   Electronically Signed   By: Morgane  Naveau M.D.   [TY]  1945 CT L-SPINE NO CHARGE IMPRESSION: 1. No acute displaced fracture or traumatic listhesis of the lumbar spine.   [TY]  2255 Basic metabolic panel(!) New AKI.  Nearly 1 point rising creatinine since 2 days ago [TY]  2255 WBC(!): 14.2 Diverticulitis on CT scan.  Given antibiotic [TY]  2255 Urinalysis, Routine w reflex microscopic -Urine, Clean Catch(!) No evidence of urinary tract infection [TY]    Clinical Course User Index [TY] Rolinda Climes, DO                                 Medical Decision Making This is an 83 year old female presenting emergency department for hip and low back pain.  History of hypertension hyperlipidemia, migraines, pacemaker who is having low back pain and left hip pain after a fall 4/24, possible fall again today.  Family members at bedside noting severe pain despite on Norco and patient unable to ambulate without significant assistance per family report.  Per chart review was evaluated 4/24 at [facility].  Negative CT of the hip at that time.  Patient is with borderline hypothermia, soft blood pressure.  Overall does not appear to be in significant distress currently.  She has a benign abdominal exam.  No red flags on history.  Will get screening labs to evaluate for  infectious pathology given her vital signs as I am concerned she may have underlying infection.  Will get repeatCT scan as well.  Pain treated initially with lidocaine  patch and Tylenol .  Will reassess and adjust pain medications as needed.  See ED course for further MDM and disposition.  Amount and/or Complexity of Data Reviewed Independent Historian:     Details: Family members noted patient with significant pain at home External Data Reviewed:     Details: See above Labs: ordered. Decision-making details documented in ED Course. Radiology: ordered and independent interpretation performed. Decision-making details documented in ED Course.    Details: Does not appear to have free air on CT scan.  No obvious fractures on my independent interpretation. Discussion of management or test interpretation with external provider(s): Case discussed with hospitalist; agrees to see admit patient  Risk OTC drugs. Prescription drug management. Parenteral controlled substances. Decision  regarding hospitalization. Diagnosis or treatment significantly limited by social determinants of health. Risk Details: Poor health literacy          Final Clinical Impression(s) / ED Diagnoses Final diagnoses:  AKI (acute kidney injury) [facility])    Rx / DC Orders ED Discharge Orders     None         Rolinda Climes, DO 03/12/24 2256
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
## 2024-03-13 ENCOUNTER — Encounter (HOSPITAL_COMMUNITY): Payer: Self-pay | Admitting: Family Medicine

## 2024-03-13 DIAGNOSIS — K5732 Diverticulitis of large intestine without perforation or abscess without bleeding: Secondary | ICD-10-CM | POA: Diagnosis present

## 2024-03-13 DIAGNOSIS — I10 Essential (primary) hypertension: Secondary | ICD-10-CM | POA: Diagnosis not present

## 2024-03-13 DIAGNOSIS — R531 Weakness: Secondary | ICD-10-CM | POA: Diagnosis not present

## 2024-03-13 DIAGNOSIS — I130 Hypertensive heart and chronic kidney disease with heart failure and stage 1 through stage 4 chronic kidney disease, or unspecified chronic kidney disease: Secondary | ICD-10-CM | POA: Diagnosis present

## 2024-03-13 DIAGNOSIS — W1830XD Fall on same level, unspecified, subsequent encounter: Secondary | ICD-10-CM | POA: Diagnosis not present

## 2024-03-13 DIAGNOSIS — N179 Acute kidney failure, unspecified: Principal | ICD-10-CM | POA: Diagnosis present

## 2024-03-13 DIAGNOSIS — I340 Nonrheumatic mitral (valve) insufficiency: Secondary | ICD-10-CM | POA: Diagnosis present

## 2024-03-13 DIAGNOSIS — M6281 Muscle weakness (generalized): Secondary | ICD-10-CM | POA: Diagnosis not present

## 2024-03-13 DIAGNOSIS — Z751 Person awaiting admission to adequate facility elsewhere: Secondary | ICD-10-CM | POA: Diagnosis not present

## 2024-03-13 DIAGNOSIS — F0390 Unspecified dementia without behavioral disturbance: Secondary | ICD-10-CM | POA: Diagnosis present

## 2024-03-13 DIAGNOSIS — Z743 Need for continuous supervision: Secondary | ICD-10-CM | POA: Diagnosis not present

## 2024-03-13 DIAGNOSIS — Z79899 Other long term (current) drug therapy: Secondary | ICD-10-CM | POA: Diagnosis not present

## 2024-03-13 DIAGNOSIS — N1832 Chronic kidney disease, stage 3b: Secondary | ICD-10-CM | POA: Diagnosis not present

## 2024-03-13 DIAGNOSIS — E869 Volume depletion, unspecified: Secondary | ICD-10-CM | POA: Diagnosis present

## 2024-03-13 DIAGNOSIS — I5020 Unspecified systolic (congestive) heart failure: Secondary | ICD-10-CM | POA: Diagnosis not present

## 2024-03-13 DIAGNOSIS — R2689 Other abnormalities of gait and mobility: Secondary | ICD-10-CM | POA: Diagnosis not present

## 2024-03-13 DIAGNOSIS — Z888 Allergy status to other drugs, medicaments and biological substances status: Secondary | ICD-10-CM | POA: Diagnosis not present

## 2024-03-13 DIAGNOSIS — W19XXXA Unspecified fall, initial encounter: Secondary | ICD-10-CM | POA: Diagnosis present

## 2024-03-13 DIAGNOSIS — R5383 Other fatigue: Secondary | ICD-10-CM | POA: Diagnosis not present

## 2024-03-13 DIAGNOSIS — I480 Paroxysmal atrial fibrillation: Secondary | ICD-10-CM | POA: Diagnosis present

## 2024-03-13 DIAGNOSIS — Y92013 Bedroom of single-family (private) house as the place of occurrence of the external cause: Secondary | ICD-10-CM | POA: Diagnosis not present

## 2024-03-13 DIAGNOSIS — N189 Chronic kidney disease, unspecified: Secondary | ICD-10-CM | POA: Diagnosis not present

## 2024-03-13 DIAGNOSIS — K449 Diaphragmatic hernia without obstruction or gangrene: Secondary | ICD-10-CM | POA: Diagnosis present

## 2024-03-13 DIAGNOSIS — Z923 Personal history of irradiation: Secondary | ICD-10-CM | POA: Diagnosis not present

## 2024-03-13 DIAGNOSIS — M1612 Unilateral primary osteoarthritis, left hip: Secondary | ICD-10-CM | POA: Diagnosis present

## 2024-03-13 DIAGNOSIS — E039 Hypothyroidism, unspecified: Secondary | ICD-10-CM | POA: Diagnosis not present

## 2024-03-13 DIAGNOSIS — R296 Repeated falls: Secondary | ICD-10-CM | POA: Diagnosis present

## 2024-03-13 DIAGNOSIS — I5022 Chronic systolic (congestive) heart failure: Secondary | ICD-10-CM | POA: Diagnosis present

## 2024-03-13 DIAGNOSIS — Z885 Allergy status to narcotic agent status: Secondary | ICD-10-CM | POA: Diagnosis not present

## 2024-03-13 DIAGNOSIS — E785 Hyperlipidemia, unspecified: Secondary | ICD-10-CM | POA: Diagnosis not present

## 2024-03-13 DIAGNOSIS — I428 Other cardiomyopathies: Secondary | ICD-10-CM | POA: Diagnosis present

## 2024-03-13 DIAGNOSIS — Z9049 Acquired absence of other specified parts of digestive tract: Secondary | ICD-10-CM | POA: Diagnosis not present

## 2024-03-13 DIAGNOSIS — Z7401 Bed confinement status: Secondary | ICD-10-CM | POA: Diagnosis not present

## 2024-03-13 DIAGNOSIS — R404 Transient alteration of awareness: Secondary | ICD-10-CM | POA: Diagnosis not present

## 2024-03-13 DIAGNOSIS — Z881 Allergy status to other antibiotic agents status: Secondary | ICD-10-CM | POA: Diagnosis not present

## 2024-03-13 DIAGNOSIS — Z79811 Long term (current) use of aromatase inhibitors: Secondary | ICD-10-CM | POA: Diagnosis not present

## 2024-03-13 DIAGNOSIS — K5792 Diverticulitis of intestine, part unspecified, without perforation or abscess without bleeding: Secondary | ICD-10-CM | POA: Diagnosis not present

## 2024-03-13 DIAGNOSIS — R278 Other lack of coordination: Secondary | ICD-10-CM | POA: Diagnosis not present

## 2024-03-13 DIAGNOSIS — Z79890 Hormone replacement therapy (postmenopausal): Secondary | ICD-10-CM | POA: Diagnosis not present

## 2024-03-13 LAB — COMPREHENSIVE METABOLIC PANEL WITH GFR
ALT: 16 U/L (ref 0–44)
AST: 15 U/L (ref 15–41)
Albumin: 3 g/dL — ABNORMAL LOW (ref 3.5–5.0)
Alkaline Phosphatase: 85 U/L (ref 38–126)
Anion gap: 12 (ref 5–15)
BUN: 59 mg/dL — ABNORMAL HIGH (ref 8–23)
CO2: 20 mmol/L — ABNORMAL LOW (ref 22–32)
Calcium: 8.9 mg/dL (ref 8.9–10.3)
Chloride: 106 mmol/L (ref 98–111)
Creatinine, Ser: 1.69 mg/dL — ABNORMAL HIGH (ref 0.44–1.00)
GFR, Estimated: 30 mL/min — ABNORMAL LOW (ref >60)
Glucose, Bld: 95 mg/dL (ref 70–99)
Potassium: 4.3 mmol/L (ref 3.5–5.1)
Sodium: 138 mmol/L (ref 135–145)
Total Bilirubin: 0.7 mg/dL (ref 0.0–1.2)
Total Protein: 6.1 g/dL — ABNORMAL LOW (ref 6.5–8.1)

## 2024-03-13 LAB — CBC
HCT: 41.8 % (ref 36.0–46.0)
Hemoglobin: 13.1 g/dL (ref 12.0–15.0)
MCH: 29.8 pg (ref 26.0–34.0)
MCHC: 31.3 g/dL (ref 30.0–36.0)
MCV: 95 fL (ref 80.0–100.0)
Platelets: 141 10*3/uL — ABNORMAL LOW (ref 150–400)
RBC: 4.4 MIL/uL (ref 3.87–5.11)
RDW: 13.2 % (ref 11.5–15.5)
WBC: 11.7 10*3/uL — ABNORMAL HIGH (ref 4.0–10.5)
nRBC: 0 % (ref 0.0–0.2)

## 2024-03-13 LAB — CK: Total CK: 36 U/L — ABNORMAL LOW (ref 38–234)

## 2024-03-13 LAB — MAGNESIUM: Magnesium: 1.9 mg/dL (ref 1.7–2.4)

## 2024-03-13 MED ORDER — ATORVASTATIN CALCIUM 10 MG PO TABS
10.0000 mg | ORAL_TABLET | Freq: Every day | ORAL | Status: DC
  Administered 2024-03-13 – 2024-03-15 (×3): 10 mg via ORAL
  Filled 2024-03-13 (×3): qty 1

## 2024-03-13 MED ORDER — GLUCAGON HCL RDNA (DIAGNOSTIC) 1 MG IJ SOLR
1.0000 mg | INTRAMUSCULAR | Status: DC | PRN
Start: 2024-03-13 — End: 2024-03-16

## 2024-03-13 MED ORDER — MEMANTINE HCL 10 MG PO TABS
5.0000 mg | ORAL_TABLET | Freq: Two times a day (BID) | ORAL | Status: DC
Start: 2024-03-13 — End: 2024-03-16
  Administered 2024-03-13 – 2024-03-15 (×5): 5 mg via ORAL
  Filled 2024-03-13 (×5): qty 1

## 2024-03-13 MED ORDER — IPRATROPIUM-ALBUTEROL 0.5-2.5 (3) MG/3ML IN SOLN: 3.0000 mL | RESPIRATORY_TRACT | Status: DC | PRN

## 2024-03-13 MED ORDER — HYDRALAZINE HCL 20 MG/ML IJ SOLN: 10.0000 mg | INTRAMUSCULAR | Status: DC | PRN

## 2024-03-13 MED ORDER — LACTATED RINGERS IV SOLN: INTRAVENOUS | Status: DC

## 2024-03-13 MED ORDER — LACTATED RINGERS IV SOLN: INTRAVENOUS | Status: AC

## 2024-03-13 MED ORDER — SENNA 8.6 MG PO TABS: 1.0000 | ORAL_TABLET | Freq: Every day | ORAL | Status: DC | PRN

## 2024-03-13 MED ORDER — METOPROLOL SUCCINATE ER 50 MG PO TB24
100.0000 mg | ORAL_TABLET | Freq: Every day | ORAL | Status: DC
  Administered 2024-03-13 – 2024-03-15 (×3): 100 mg via ORAL
  Filled 2024-03-13 (×3): qty 2

## 2024-03-13 MED ORDER — LEVOTHYROXINE SODIUM 88 MCG PO TABS
88.0000 ug | ORAL_TABLET | Freq: Every day | ORAL | Status: DC
  Administered 2024-03-13 – 2024-03-15 (×3): 88 ug via ORAL
  Filled 2024-03-13 (×3): qty 1

## 2024-03-13 MED ORDER — ACETAMINOPHEN 500 MG PO TABS
1000.0000 mg | ORAL_TABLET | Freq: Three times a day (TID) | ORAL | Status: DC
  Administered 2024-03-13 – 2024-03-15 (×7): 1000 mg via ORAL
  Filled 2024-03-13 (×7): qty 2

## 2024-03-13 MED ORDER — METOPROLOL TARTRATE 5 MG/5ML IV SOLN: 5.0000 mg | INTRAVENOUS | Status: DC | PRN

## 2024-03-13 MED ORDER — BOOST / RESOURCE BREEZE PO LIQD CUSTOM
1.0000 | Freq: Three times a day (TID) | ORAL | Status: DC
  Administered 2024-03-13 – 2024-03-15 (×3): 1 via ORAL

## 2024-03-13 NOTE — Evaluation (Signed)
 Physical Therapy Evaluation Patient Details Name: Capresha Beckey MRN: [id number] DOB: [birth year] Today's Date: 03/13/2024  History of Present Illness  83 y.o. female with who presented to the ED for evaluation of low back pain and left hip pain after a fall on 4/24.  CT of the left hip, head and cervical spine were negative.  Had mild AKI and eventually discharged home.  4/26 Returns back to the ED after another unwitnessed fall, difficulty ambulating.  CT abdomen pelvis this time shows uncomplicated mid transverse diverticulitis and AKI. Past medical history significant for chronic systolic CHF s/p CRT-P (EF recovered 55-60%), NICM, CKD stage IIIa, HTN, HLD, hypothyroidism, dementia, pacemaker, left breast cancer  Clinical Impression  Pt admitted with above diagnosis.  Pt currently with functional limitations due to the deficits listed below (see PT Problem List). Pt will benefit from acute skilled PT to increase their independence and safety with mobility to allow discharge.  Pt admitted from home alone after 2 falls in the last few days.  Pt c/o significant pain in back and left hip radiating down left leg with returning to bed from bathroom.  Pt declined farther ambulation due to pain.  Pt's daughter present and reports once pain medication wears off, pt is in excruciating pain.  Due to pt being alone at home (two daughters check in daily but can't provide 24/7 care) and recent falls, with pain and limited mobility, patient will benefit from continued inpatient follow up therapy, <3 hours/day.          If plan is discharge home, recommend the following: A little help with walking and/or transfers;A little help with bathing/dressing/bathroom;Assistance with cooking/housework;Assist for transportation;Help with stairs or ramp for entrance   Can travel by private vehicle   Yes    Equipment Recommendations None recommended by PT  Recommendations for Other Services       Functional Status  Assessment Patient has had a recent decline in their functional status and demonstrates the ability to make significant improvements in function in a reasonable and predictable amount of time.     Precautions / Restrictions Precautions Precautions: Fall Restrictions Weight Bearing Restrictions Per Provider Order: No      Mobility  Bed Mobility Overal bed mobility: Needs Assistance Bed Mobility: Supine to Sit, Sit to Supine     Supine to sit: Supervision Sit to supine: Supervision        Transfers Overall transfer level: Needs assistance Equipment used: None Transfers: Sit to/from Stand Sit to Stand: Contact guard assist           General transfer comment: cues for hand placement    Ambulation/Gait Ambulation/Gait assistance: Min assist, Contact guard assist Gait Distance (Feet): 16 Feet (total) Assistive device: None Gait Pattern/deviations: Decreased stride length, Step-through pattern, Antalgic, Decreased stance time - left       General Gait Details: pt requesting bathroom urgently upon standing to ambulated into bathroom; pt very unsteady, reaching out for objects and wall to self steady, pt declined ambulating in hallway due to increase in radiating left hip and back pain  Stairs            Wheelchair Mobility     Tilt Bed    Modified Rankin (Stroke Patients Only)       Balance Overall balance assessment: History of Falls  Pertinent Vitals/Pain Pain Assessment Pain Assessment: Faces Faces Pain Scale: Hurts even more Pain Location: left hip and low back radiating down leg Pain Descriptors / Indicators: Grimacing, Shooting Pain Intervention(s): Monitored during session, Repositioned    Home Living Family/patient expects to be discharged to:: Private residence Living Arrangements: Alone Available Help at Discharge: Family;Available PRN/intermittently Type of Home: House Home  Access: Stairs to enter   Entrance Stairs-Number of Steps: 2   Home Layout: Able to live on main level with bedroom/bathroom Home Equipment: Agricultural consultant (2 wheels)      Prior Function Prior Level of Function : Independent/Modified Independent                     Extremity/Trunk Assessment        Lower Extremity Assessment Lower Extremity Assessment: Generalized weakness;Difficult to assess due to impaired cognition       Communication   Communication Communication: No apparent difficulties    Cognition Arousal: Alert Behavior During Therapy: WFL for tasks assessed/performed   PT - Cognitive impairments: History of cognitive impairments                       PT - Cognition Comments: hx dementia Following commands: Intact       Cueing Cueing Techniques: Verbal cues     General Comments      Exercises     Assessment/Plan    PT Assessment Patient needs continued PT services  PT Problem List Decreased strength;Decreased activity tolerance;Decreased balance;Decreased mobility;Decreased knowledge of use of DME;Decreased cognition;Pain       PT Treatment Interventions Gait training;DME instruction;Functional mobility training;Therapeutic activities;Therapeutic exercise;Balance training;Stair training;Patient/family education    PT Goals (Current goals can be found in the Care Plan section)  Acute Rehab PT Goals PT Goal Formulation: With patient/family Time For Goal Achievement: 03/27/24 Potential to Achieve Goals: Good    Frequency Min 2X/week     Co-evaluation               AM-PAC PT "6 Clicks" Mobility  Outcome Measure Help needed turning from your back to your side while in a flat bed without using bedrails?: A Little Help needed moving from lying on your back to sitting on the side of a flat bed without using bedrails?: A Little Help needed moving to and from a bed to a chair (including a wheelchair)?: A Little Help needed  standing up from a chair using your arms (e.g., wheelchair or bedside chair)?: A Little Help needed to walk in hospital room?: A Lot Help needed climbing 3-5 steps with a railing? : A Lot 6 Click Score: 16    End of Session Equipment Utilized During Treatment: Gait belt Activity Tolerance: Patient limited by pain Patient left: with call bell/phone within reach;in bed;with bed alarm set;with family/visitor present Nurse Communication: Mobility status PT Visit Diagnosis: Difficulty in walking, not elsewhere classified (R26.2)    Time: 1610-9604 PT Time Calculation (min) (ACUTE ONLY): 22 min   Charges:   PT Evaluation $PT Eval Low Complexity: 1 Low   PT General Charges $$ ACUTE PT VISIT: 1 Visit       Henretta Lodge PT, DPT Physical Therapist Acute Rehabilitation Services Office: [phone]   Myna Asal Payson 03/13/2024, 12:27 PM

## 2024-03-13 NOTE — TOC Initial Note (Signed)
 Transition of Care [facility]) - Initial/Assessment Note    Patient Details  Name: Erin Novak MRN: [id number] Date of Birth: [birth year]  Transition of Care [facility]) CM/SW Contact:    Levie Ream, RN Phone Number: 03/13/2024, 11:53 AM  Clinical Narrative:                 Pt disoriented; information obtained from her dtr Lorita Rosa [phone]) at bedside; she says pt lives at home; she is not sure of where pt will discharge to, or transportation; her dtr verified insurance/PCP; she denied pt experiencing SDOH risks; pt has walker, and shower chair; she does not have HH services, or home oxygen; awaiting PT/OT evals; TOC will follow.  Expected Discharge Plan: Home/Self Care Barriers to Discharge: Continued Medical Work up   Patient Goals and CMS Choice Patient states their goals for this hospitalization and ongoing recovery are:: patient's dtr Lorita Rosa says she is unsure CMS Medicare.gov Compare Post Acute Care list provided to:: Patient Represenative (must comment) Lorita Rosa (dtr))   [place] ownership interest in [facility].provided to:: Adult Children    Expected Discharge Plan and Services   Discharge Planning Services: CM Consult   Living arrangements for the past 2 months: Single Family Home                                      Prior Living Arrangements/Services Living arrangements for the past 2 months: Single Family Home Lives with:: Self Patient language and need for interpreter reviewed:: Yes Do you feel safe going back to the place where you live?: Yes      Need for Family Participation in Patient Care: Yes (Comment) Care giver support system in place?: Yes (comment) Current home services: DME (walker, shower chair) Criminal Activity/Legal Involvement Pertinent to Current Situation/Hospitalization: No - Comment as needed  Activities of Daily Living   ADL Screening (condition at time of admission) Independently performs  ADLs?: No Does the patient have a NEW difficulty with bathing/dressing/toileting/self-feeding that is expected to last >3 days?: Yes (Initiates electronic notice to provider for possible OT consult) Does the patient have a NEW difficulty with getting in/out of bed, walking, or climbing stairs that is expected to last >3 days?: Yes (Initiates electronic notice to provider for possible PT consult) Does the patient have a NEW difficulty with communication that is expected to last >3 days?: No Is the patient deaf or have difficulty hearing?: Yes Does the patient have difficulty seeing, even when wearing glasses/contacts?: No Does the patient have difficulty concentrating, remembering, or making decisions?: Yes  Permission Sought/Granted Permission sought to share information with : Case Manager Permission granted to share information with : Yes, Verbal Permission Granted  Share Information with NAME: Case Manager     Permission granted to share info w Relationship: Lorita Rosa (dtr) [phone]     Emotional Assessment Appearance:: Appears stated age Attitude/Demeanor/Rapport: Unable to Assess Affect (typically observed): Unable to Assess Orientation: :  (unable to assess) Alcohol / Substance Use: Not Applicable Psych Involvement: No (comment)  Admission diagnosis:  AKI (acute kidney injury) (HCC) [N17.9] Acute diverticulitis [K57.92] Patient Active Problem List   Diagnosis Date Noted   Acute diverticulitis 03/12/2024   Acute kidney injury superimposed on chronic kidney disease (HCC) 03/12/2024   Medication management 02/17/2024   HFrEF (heart failure with reduced ejection fraction) (HCC) 05/01/2022   Stage 3b chronic  kidney disease (HCC) 03/08/2020   Erroneous encounter - disregard 03/06/2020   Sinus node dysfunction (HCC) 07/12/2019   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 05/04/2019   Nonischemic cardiomyopathy (HCC) 04/28/2019   Nonrheumatic mitral valve  regurgitation 04/28/2019   LBBB (left bundle branch block) 10/30/2018   Pacemaker: CRTP St Jude 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 - 10/29/2018 in situ 10/30/2018   Chronic systolic heart failure (HCC) 10/29/2018   Paroxysmal atrial fibrillation (HCC) 09/24/2018   CHF (congestive heart failure) (HCC) 09/24/2018   Hypokalemia 09/24/2018   Malignant neoplasm of upper-outer quadrant of left breast in female, estrogen receptor positive (HCC) 01/28/2017   Cholelithiasis without obstruction 07/15/2011   Gallstones 07/10/2011   GERD (gastroesophageal reflux disease) 07/10/2011   Obesity 07/10/2011   Hypothyroidism 02/07/2008   HLD (hyperlipidemia) 02/07/2008   Essential hypertension 02/07/2008   PCP:  Pete Brand, DO Pharmacy:   [facility] [address] [place] - [address] [place] [address] Phone: [phone] Fax: [phone]  [facility] Delivery) - [place], [place] - [address] [facility] [address] [place] [address] [place] [address] Phone: [phone] Fax: [phone]  [facility] Delivery - [place], [place] - [address] [place] [address] Phone: [phone] Fax: [phone]     Social Drivers of Health (SDOH) Social History: SDOH Screenings   Food Insecurity: No Food Insecurity (03/13/2024)  Housing: Low Risk  (03/13/2024)  Transportation Needs: No Transportation Needs (03/13/2024)  Utilities: Not At Risk (03/13/2024)  Social Connections: Unknown (03/13/2024)  Tobacco Use: Low Risk  (03/13/2024)   SDOH Interventions: Food Insecurity Interventions: Intervention Not Indicated, Inpatient TOC Housing Interventions: Intervention Not Indicated, Inpatient TOC Transportation Interventions: Intervention Not Indicated, Inpatient TOC Utilities Interventions: Intervention Not Indicated, Inpatient TOC   Readmission Risk Interventions     No data to display

## 2024-03-13 NOTE — Care Management Obs Status (Signed)
 MEDICARE OBSERVATION STATUS NOTIFICATION   Patient Details  Name: Erin Novak MRN: [id number] Date of Birth: 12/15/1940   Medicare Observation Status Notification Given:  Yes    Levie Ream, RN 03/13/2024, 11:42 AM

## 2024-03-13 NOTE — Progress Notes (Signed)
 PROGRESS NOTE    Erin Novak  ZOX:[id number] DOB: Jan 27, 1941 DOA: 03/12/2024 PCP: Pete Brand, DO    Brief Narrative:  83 y.o. female with medical history significant for chronic systolic CHF s/p CRT-P (EF recovered 55-60%), NICM, CKD stage IIIa, HTN, HLD, hypothyroidism, dementia who presented to the ED for evaluation of low back pain and left hip pain after a fall on 4/24.  CT of the left hip, head and cervical spine were negative.  Had mild AKI and eventually discharged home.  Returns back to the ED after another unwitnessed fall, difficulty ambulating.  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  Currently holding nephrotoxic drugs.  CK level minimally elevated.  Improving with IV fluids which I will continue.  Creatinine today 1.69.   Acute diverticulitis: Uncomplicated Appears to be uncomplicated transverse diverticulitis.  Currently on Rocephin and Flagyl.  WBCs improving.  Diet as tolerated.  Would suggest colonoscopy in 6-8 weeks if patient desires.  Colonoscopy 2014-showed diverticulosis and hemorrhoids   Falls at home/ambulatory dysfunction: 2 recent unwitnessed falls at home.  No significant injury identified. - PT/OT eval, fall precautions   Chronic systolic congestive heart failure s/p CRT-P: EF has recovered from 20-25% to 55-60% after BiV PPM placement.  She appears volume depleted on admission. - Monitor strict I/O's while receiving IV fluid hydration - Holding Lasix , Entresto ,  spironolactone  - Resume home Toprol -XL 100 mg daily tomorrow   Hypertension: Continue Toprol -XL.  Holding Entresto , spironolactone , Lasix , amlodipine  for now.   Hyperlipidemia: Continue atorvastatin , hold if CK is elevated.   Hypothyroidism: Continue Synthroid .   Dementia: Patient with reported history of dementia.  Will continue home Namenda at reduced dose given renal dysfunction.  Delirium precautions.  PT/OT    DVT prophylaxis: heparin  injection 5,000 Units Start: 03/13/24 0600    Code Status: Full Code Family Communication:  Updated Lisa.  Still has AKI, cont IVF.   Subjective: Feels better. No new complaints.    Examination:  General exam: Appears calm and comfortable  Respiratory system: Clear to auscultation. Respiratory effort normal. Cardiovascular system: S1 & S2 heard, RRR. No JVD, murmurs, rubs, gallops or clicks. No pedal edema. Gastrointestinal system: Abdomen is nondistended, soft and nontender. No organomegaly or masses felt. Normal bowel sounds heard. Central nervous system: Alert and oriented. No focal neurological deficits. Extremities: Symmetric 5 x 5 power. Skin: No rashes, lesions or ulcers Psychiatry: Judgement and insight appear normal. Mood & affect appropriate.                Diet Orders (From admission, onward)     Start     Ordered   03/13/24 0011  Diet clear liquid Fluid consistency: Thin  Diet effective now       Question:  Fluid consistency:  Answer:  Thin   03/13/24 0011            Objective: Vitals:   03/12/24 2257 03/13/24 0341 03/13/24 0704 03/13/24 0940  BP: (!) 115/51 (!) 120/52  (!) 123/52  Pulse: 66  70  75  Resp: 17 17    Temp: (!) 97.4 F (36.3 C) (!) 97.5 F (36.4 C)    TempSrc: Oral Oral    SpO2: 96% 97%    Weight:   79.2 kg     Intake/Output Summary (Last 24 hours) at 03/13/2024 1133 Last data filed at 03/13/2024 0300 Gross per 24 hour  Intake 840 ml  Output --  Net 840 ml   Filed Weights    03/13/24 0704  Weight: 79.2 kg    Scheduled Meds:  atorvastatin   10 mg Oral Daily   heparin   5,000 Units Subcutaneous Q8H   levothyroxine   88 mcg Oral Q0600   memantine  5 mg Oral BID   metoprolol  succinate  100 mg Oral Daily   multivitamin with minerals  1 tablet Oral Daily   Continuous Infusions:  cefTRIAXone (ROCEPHIN)  IV     lactated ringers  75 mL/hr at 03/13/24 0943   metronidazole 500 mg (03/13/24 0942)    Nutritional status     Body mass index is 30.93 kg/m.  Data Reviewed:   CBC: Recent Labs  Lab 03/10/24 1400 03/12/24 1637 03/13/24 0454  WBC 12.4* 14.2* 11.7*  NEUTROABS 9.7*  --   --   HGB 15.6* 14.8 13.1  HCT 46.3* 44.1 41.8  MCV 89.4 89.6 95.0  PLT 185 160 141*   Basic Metabolic Panel: Recent Labs  Lab 03/10/24 1400 03/12/24 1637 03/13/24 0454  NA 136 137 138  K 3.8 4.0 4.3  CL 102 100 106  CO2 20* 23 20*  GLUCOSE 135* 108* 95  BUN 48* 60* 59*  CREATININE 1.56* 2.46* 1.69*  CALCIUM  9.3 9.9 8.9  MG  --   --  1.9   GFR: Estimated Creatinine Clearance: 24.7 mL/min (A) (by C-G formula based on SCr of 1.69 mg/dL (H)). Liver Function Tests: Recent Labs  Lab 03/13/24 0454  AST 15  ALT 16  ALKPHOS 85  BILITOT 0.7  PROT 6.1*  ALBUMIN 3.0*   No results for input(s): "LIPASE", "AMYLASE" in the last 168 hours. No results for input(s): "AMMONIA" in the last 168 hours. Coagulation Profile: No results for input(s): "INR", "PROTIME" in the last 168 hours. Cardiac Enzymes: Recent Labs  Lab 03/10/24 1400 03/13/24 0454  CKTOTAL 43 36*   BNP (last 3 results) No results for input(s): "PROBNP" in the last 8760 hours. HbA1C: No results for input(s): "HGBA1C" in the last 72 hours. CBG: No results for input(s): "GLUCAP" in the last 168 hours. Lipid Profile: No results for input(s): "CHOL", "HDL", "LDLCALC", "TRIG", "CHOLHDL", "LDLDIRECT" in the last 72 hours. Thyroid  Function Tests: No results for input(s): "TSH", "T4TOTAL", "FREET4",  "T3FREE", "THYROIDAB" in the last 72 hours. Anemia Panel: No results for input(s): "VITAMINB12", "FOLATE", "FERRITIN", "TIBC", "IRON", "RETICCTPCT" in the last 72 hours. Sepsis Labs: Recent Labs  Lab 03/10/24 1400 03/12/24 1637  LATICACIDVEN 1.6 1.5    No results found for this or any previous visit (from the past 240 hours).       Radiology Studies: CT L-SPINE NO CHARGE Result Date: 03/12/2024 CLINICAL DATA:  Back trauma, no prior imaging (Age >= 16y) EXAM: CT LUMBAR SPINE WITHOUT CONTRAST TECHNIQUE: Multidetector CT imaging of the lumbar spine was performed without intravenous contrast administration. Multiplanar CT image reconstructions were also generated. RADIATION DOSE REDUCTION: This exam was performed according to the departmental dose-optimization program which includes automated exposure control, adjustment of the mA and/or kV according to patient size and/or use of iterative  reconstruction technique. COMPARISON:  CT abdomen pelvis 03/12/2024, CT left hip 03/10/2024 FINDINGS: Segmentation: 5 lumbar type vertebrae. Alignment: Normal. Vertebrae: Multilevel severe degenerative changes of the spine with endplate sclerosis at the L1-L2, L4-L5, L5-S1 levels. Associated posterior disc osteophyte complex formation at the L1-L2, L4-L5, L5-S1 levels. At least moderate osseous neural stenosis at the L5-S1 level. No severe central canal stenosis. No acute fracture or focal pathologic process. Paraspinal and other soft tissues: Negative. Disc levels: Multilevel intervertebral disc space vacuum phenomenon. IMPRESSION: 1. No acute displaced fracture or traumatic listhesis of the lumbar spine. 2. Please see separately dictated CT abdomen pelvis 03/12/2024. Electronically Signed   By: Morgane  Naveau M.D.   On: 03/12/2024 19:12   CT ABDOMEN PELVIS WO CONTRAST Result Date: 03/12/2024 CLINICAL DATA:  Sepsis.  Complaining of left hip pain.  Fall recent EXAM: CT ABDOMEN AND PELVIS WITHOUT CONTRAST  TECHNIQUE: Multidetector CT imaging of the abdomen and pelvis was performed following the standard protocol without IV contrast. RADIATION DOSE REDUCTION: This exam was performed according to the departmental dose-optimization program which includes automated exposure control, adjustment of the mA and/or kV according to patient size and/or use of iterative reconstruction technique. COMPARISON:  None Available. FINDINGS: Lower chest: Right base atelectasis. Cardiac leads noted. Small to moderate volume hiatal hernia. Hepatobiliary: Not enlarged. No focal lesion. Status post cholecystectomy.  No biliary ductal dilatation. Pancreas: Normal pancreatic contour. No main pancreatic duct dilatation. Spleen: Not enlarged. No focal lesion. Splenule noted. Adrenals/Urinary Tract: No nodularity bilaterally. At least partially duplicated right collecting system. No hydroureteronephrosis. No nephroureterolithiasis. No contour deforming renal mass. The urinary bladder is unremarkable. Stomach/Bowel: No small bowel wall thickening or dilatation. No large bowel dilatation. Colonic diverticulosis. Mild focal diverticular wall thickening and pericolonic fat stranding along a mid transverse colon diverticula (2:44). The appendix is unremarkable. Vasculature/Lymphatic: Atherosclerotic plaque.  No abdominal aorta or iliac aneurysm. No abdominal, pelvic, inguinal lymphadenopathy. Reproductive: Status post hysterectomy.  No adnexal mass identified. Other: No simple free fluid ascites. No pneumoperitoneum. No mesenteric hematoma identified. No organized fluid collection. Musculoskeletal: No significant soft tissue hematoma. No acute pelvic fracture. Please see separately dictated CT lumbar spine 03/12/2024. Other ports and devices: None. Other ports and devices: None. IMPRESSION: 1. Colonic diverticulosis with acute mild uncomplicated mid transverse colon diverticulitis. 2. No acute intra-abdominal or intrapelvic traumatic injury with  limited evaluation on this noncontrast study. 3. No hip or pelvic fracture. 4. Please see separately dictated CT lumbar spine 03/12/2024. 5. Small to moderate volume hiatal hernia. 6.  Aortic Atherosclerosis (ICD10-I70.0). Electronically Signed   By: Morgane  Naveau M.D.   On: 03/12/2024 19:09   DG Chest Portable 1 View Result Date: 03/12/2024 CLINICAL DATA:  SIRS Pt brought in by family. Pt was lying in the back of the car on a blanket. Staff transferred pt to stretcher and placed in room 11. Daughter states she was seen prior at [facility] for hip pain, after sliding out of her chair . Still with pain EXAM: PORTABLE CHEST 1 VIEW COMPARISON:  Chest x-ray 10/30/2018, chest x-ray 12/23/2011, CT chest 10/22/2007 FINDINGS: Left chest wall triple lead pacemaker. The heart and mediastinal contours are within normal limits. Mildly prominent hilar vasculature. No focal consolidation. Increased interstitial markings. No pleural effusion. No pneumothorax. No acute osseous abnormality. IMPRESSION: Mild pulmonary edema. Electronically Signed   By: Morgane  Naveau M.D.   On: 03/12/2024 18:58   CT Head Wo Contrast Result Date: 03/12/2024 CLINICAL DATA:  Head trauma, minor (Age >=  65y) Headache, neuro deficit. Left hip pain EXAM: CT HEAD WITHOUT CONTRAST TECHNIQUE: Contiguous axial images were obtained from the base of the skull through the vertex without intravenous contrast. RADIATION DOSE REDUCTION: This exam was performed according to the departmental dose-optimization program which includes automated exposure control, adjustment of the mA and/or kV according to patient size and/or use of iterative reconstruction technique. COMPARISON:  CT head 03/10/2024 FINDINGS: Brain: Stable prominence of the lateral ventricles may be related to central predominant atrophy, although a component of normal pressure/communicating hydrocephalus cannot be excluded. Patchy and confluent areas of decreased attenuation are noted throughout  the deep and periventricular white matter of the cerebral hemispheres bilaterally, compatible with chronic microvascular ischemic disease. No evidence of large-territorial acute infarction. No parenchymal hemorrhage. No mass lesion. No extra-axial collection. No mass effect or midline shift. No hydrocephalus. Basilar cisterns are patent. Vascular: No hyperdense vessel. Atherosclerotic calcifications are present within the cavernous internal carotid arteries. Skull: No acute fracture or focal lesion. Sinuses/Orbits: Bilateral sphenoid sinus mucosal thickening. Bilateral sphenoid sinus wall hypertrophy. Bilateral maxillary sinus mucosal thickening. Otherwise paranasal sinuses and mastoid air cells are clear. The orbits are unremarkable. Other: None. IMPRESSION: 1. No acute intracranial abnormality. 2. Stable prominence of the lateral ventricles may be related to central predominant atrophy, although a component of normal pressure/communicating hydrocephalus cannot be excluded. 3. 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 Maxillary sinus disease. Electronically Signed   By: Morgane  Naveau M.D.   On: 03/12/2024 18:55           LOS: 0 days   Time spent= 35 mins    Maggie Schooner, MD [facility]  If 7PM-7AM, please contact night-coverage  03/13/2024, 11:33 AM

## 2024-03-13 NOTE — Hospital Course (Addendum)
 Brief Narrative:  83 y.o. female with medical history significant for chronic systolic CHF s/p CRT-P (EF recovered 55-60%), NICM, CKD stage IIIa, HTN, HLD, hypothyroidism, dementia who presented to the ED for evaluation of low back pain and left hip pain after a fall on 4/24.  CT of the left hip, head and cervical spine were negative.  Had mild AKI and eventually discharged home.  Returns back to the ED after another unwitnessed fall, difficulty ambulating.  CT abdomen pelvis this time shows uncomplicated mid transverse diverticulitis and AKI.Aaron Aas Upon admission she was started on IV fluids which resolved her AKI and was started on IV antibiotics Rocephin and Flagyl for uncomplicated diverticulitis.  Slowly tolerating p.o. without any issues. Overall medically improving now awaiting SNF placement.  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  With IV fluids creatinine is now down to baseline 1.29.   Acute diverticulitis: Uncomplicated Appears to be uncomplicated transverse diverticulitis.  Currently on Rocephin and Flagyl.  Eventually we can transition to p.o. Augmentin.  WBCs improving.  Diet as tolerated.  Would suggest colonoscopy in 6-8 weeks if patient desires.  Colonoscopy 2014-showed diverticulosis and hemorrhoids   Falls at home/ambulatory dysfunction: 2 recent unwitnessed falls at home.  No significant injury identified. - PT/OT eval, fall precautions   Chronic systolic congestive heart failure s/p CRT-P: EF has recovered from 20-25% to 55-60% after BiV PPM placement.  She appears volume depleted  on admission. -Hopefully will be able to resume cardiac medications upon discharge   Hypertension: On multiple cardiac medications.   Hyperlipidemia: Continue atorvastatin , hold if CK is elevated.   Hypothyroidism: Continue Synthroid .   Dementia: Patient with reported history of dementia.  Will continue home Namenda at reduced dose given renal dysfunction.  Delirium precautions.  PT/OT-SNF    DVT prophylaxis: heparin  injection 5,000 Units Start: 03/13/24 0600    Code Status: Full Code Family Communication: Met with daughter at bedside Mainly awaiting SNF placement.  Subjective: Abdomen is feeling still slightly better. Examination:  General exam: Appears calm and comfortable  Respiratory system: Clear to auscultation. Respiratory effort normal. Cardiovascular system: S1 & S2 heard, RRR. No JVD, murmurs, rubs, gallops or clicks. No pedal edema. Gastrointestinal system: Abdomen is nondistended, soft and nontender. No organomegaly or masses felt. Normal bowel sounds heard. Central nervous system: Alert and oriented. No focal neurological deficits. Extremities: Symmetric 5 x 5 power. Skin: No rashes, lesions or ulcers Psychiatry: Judgement and insight appear normal. Mood & affect appropriate.

## 2024-03-13 NOTE — Plan of Care (Signed)

## 2024-03-14 DIAGNOSIS — N189 Chronic kidney disease, unspecified: Secondary | ICD-10-CM | POA: Diagnosis not present

## 2024-03-14 DIAGNOSIS — N179 Acute kidney failure, unspecified: Secondary | ICD-10-CM | POA: Diagnosis not present

## 2024-03-14 LAB — MAGNESIUM: Magnesium: 1.9 mg/dL (ref 1.7–2.4)

## 2024-03-14 LAB — BASIC METABOLIC PANEL WITH GFR
Anion gap: 11 (ref 5–15)
BUN: 41 mg/dL — ABNORMAL HIGH (ref 8–23)
CO2: 21 mmol/L — ABNORMAL LOW (ref 22–32)
Calcium: 8.9 mg/dL (ref 8.9–10.3)
Chloride: 105 mmol/L (ref 98–111)
Creatinine, Ser: 1.29 mg/dL — ABNORMAL HIGH (ref 0.44–1.00)
GFR, Estimated: 41 mL/min — ABNORMAL LOW (ref >60)
Glucose, Bld: 89 mg/dL (ref 70–99)
Potassium: 3.8 mmol/L (ref 3.5–5.1)
Sodium: 137 mmol/L (ref 135–145)

## 2024-03-14 LAB — CBC
HCT: 38.7 % (ref 36.0–46.0)
Hemoglobin: 12.4 g/dL (ref 12.0–15.0)
MCH: 29.8 pg (ref 26.0–34.0)
MCHC: 32 g/dL (ref 30.0–36.0)
MCV: 93 fL (ref 80.0–100.0)
Platelets: 136 10*3/uL — ABNORMAL LOW (ref 150–400)
RBC: 4.16 MIL/uL (ref 3.87–5.11)
RDW: 13.3 % (ref 11.5–15.5)
WBC: 11.3 10*3/uL — ABNORMAL HIGH (ref 4.0–10.5)
nRBC: 0 % (ref 0.0–0.2)

## 2024-03-14 NOTE — [place] FL2 (Signed)
 [place]  MEDICAID FL2 LEVEL OF CARE FORM     IDENTIFICATION  Patient Name: Jacoby Beirne Birthdate: [birth year] Sex: female Admission Date (Current Location): 03/12/2024  [facility] and [place] Number:  Geophysical data processor and Address:  [facility],  [address] [place] [address]      Provider Number: [id number]  Attending Physician Name and Address:  Maggie Schooner, MD  Relative Name and Phone Number:       Current Level of Care: SNF Recommended Level of Care: Skilled Nursing Facility Prior Approval Number:    Date Approved/Denied:   PASRR Number: [id number]  Discharge Plan: SNF    Current Diagnoses: Patient Active Problem List   Diagnosis Date Noted   AKI (acute kidney injury) (HCC) 03/13/2024   Acute diverticulitis 03/12/2024   Acute kidney injury superimposed on chronic kidney disease (HCC) 03/12/2024   Medication management 02/17/2024   HFrEF (heart failure with reduced ejection fraction) (HCC) 05/01/2022   Stage 3b chronic kidney disease (HCC) 03/08/2020   Erroneous encounter - disregard 03/06/2020   Sinus node dysfunction (HCC) 07/12/2019   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 05/04/2019   Nonischemic cardiomyopathy (HCC) 04/28/2019   Nonrheumatic mitral valve regurgitation 04/28/2019   LBBB (left bundle branch block) 10/30/2018   Pacemaker: CRTP St Jude 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 - 10/29/2018 in situ 10/30/2018   Chronic systolic heart failure (HCC) 10/29/2018   Paroxysmal atrial fibrillation (HCC) 09/24/2018   CHF (congestive heart failure) (HCC) 09/24/2018   Hypokalemia 09/24/2018   Malignant neoplasm of upper-outer quadrant of left breast in female, estrogen receptor positive (HCC) 01/28/2017   Cholelithiasis without obstruction 07/15/2011   Gallstones 07/10/2011   GERD (gastroesophageal reflux disease) 07/10/2011   Obesity 07/10/2011   Hypothyroidism 02/07/2008   HLD (hyperlipidemia) 02/07/2008   Essential  hypertension 02/07/2008    Orientation RESPIRATION BLADDER Height & Weight     Self, Time, Situation, Place  Normal Continent Weight: 79.2 kg Height:  5\' 3"  (160 cm)  BEHAVIORAL SYMPTOMS/MOOD NEUROLOGICAL BOWEL NUTRITION STATUS        Diet (see discharge summary)  AMBULATORY STATUS COMMUNICATION OF NEEDS Skin   Limited Assist Verbally Normal                       Personal Care Assistance Level of Assistance              Functional Limitations Info             SPECIAL CARE FACTORS FREQUENCY  PT (By licensed PT), OT (By licensed OT)     PT Frequency: 5X weekly OT Frequency: 5X weekly            Contractures Contractures Info: Not present    Additional Factors Info  Allergies   Allergies Info: Percodan (Oxycodone -aspirin ), Prednisone, Amoxicillin-pot Clavulanate, Lisinopril           Current Medications (03/14/2024):  This is the current hospital active medication list Current Facility-Administered Medications  Medication Dose Route Frequency Provider Last Rate Last Admin   acetaminophen  (TYLENOL ) tablet 1,000 mg  1,000 mg Oral TID Amin, Ankit C, MD   1,000 mg at 03/14/24 0923   acetaminophen  (TYLENOL ) tablet 650 mg  650 mg Oral Q6H PRN Kyere, Belinda K, NP   650 mg at 03/14/24 0244   atorvastatin  (LIPITOR) tablet 10 mg  10 mg Oral Daily Patel, Vishal R, MD   10 mg at 03/14/24 0924   cefTRIAXone (ROCEPHIN)  2 g in sodium chloride  0.9 % 100 mL IVPB  2 g Intravenous Q24H Kyere, Belinda K, NP 200 mL/hr at 03/13/24 2053 2 g at 03/13/24 2053   feeding supplement (BOOST / RESOURCE BREEZE) liquid 1 Container  1 Container Oral TID BM Amin, Ankit C, MD   1 Container at 03/13/24 1415   glucagon (human recombinant) (GLUCAGEN) injection 1 mg  1 mg Intravenous PRN Amin, Ankit C, MD       heparin  injection 5,000 Units  5,000 Units Subcutaneous Q8H Kyere, Belinda K, NP   5,000 Units at 03/14/24 0508   hydrALAZINE  (APRESOLINE ) injection 10 mg  10 mg Intravenous Q4H PRN  Amin, Ankit C, MD       ipratropium-albuterol  (DUONEB) 0.5-2.5 (3) MG/3ML nebulizer solution 3 mL  3 mL Nebulization Q4H PRN Amin, Ankit C, MD       levothyroxine  (SYNTHROID ) tablet 88 mcg  88 mcg Oral Q0600 Patel, Vishal R, MD   88 mcg at 03/14/24 0508   memantine (NAMENDA) tablet 5 mg  5 mg Oral BID Patel, Vishal R, MD   5 mg at 03/14/24 8295   metoprolol  succinate (TOPROL -XL) 24 hr tablet 100 mg  100 mg Oral Daily Patel, Vishal R, MD   100 mg at 03/14/24 6213   metoprolol  tartrate (LOPRESSOR ) injection 5 mg  5 mg Intravenous Q4H PRN Amin, Ankit C, MD       metroNIDAZOLE (FLAGYL) IVPB 500 mg  500 mg Intravenous Q12H Kyere, Belinda K, NP 100 mL/hr at 03/14/24 0811 500 mg at 03/14/24 0865   multivitamin with minerals tablet 1 tablet  1 tablet Oral Daily Kyere, Belinda K, NP   1 tablet at 03/14/24 7846   ondansetron  (ZOFRAN ) tablet 4 mg  4 mg Oral Q6H PRN Kyere, Belinda K, NP       Or   ondansetron  (ZOFRAN ) injection 4 mg  4 mg Intravenous Q6H PRN Kyere, Belinda K, NP       senna (SENOKOT) tablet 8.6 mg  1 tablet Oral Daily PRN Patel, Vishal R, MD         Discharge Medications: Please see discharge summary for a list of discharge medications.  Relevant Imaging Results:  Relevant Lab Results:   Additional Information SS# [id number]  Kathryn Parish, RN

## 2024-03-14 NOTE — Progress Notes (Signed)
 Heart Failure Navigator Progress Note  Assessed for Heart & Vascular TOC clinic readiness.  Patient does not meet criteria due to per MD note patient with history of Dementia. No HF TOC. .   Navigator will sign off at this time.   Randie Bustle, BSN, Scientist, clinical (histocompatibility and immunogenetics) Only

## 2024-03-14 NOTE — Plan of Care (Signed)

## 2024-03-14 NOTE — Evaluation (Signed)
 Occupational Therapy Evaluation Patient Details Name: Erin Novak MRN: [id number] DOB: 02/01/1941 Today's Date: 03/14/2024   History of Present Illness   Patient is a 83 year old female who presented on 4/24 after a fall with low back pain and L hip pain. Imaging was unremarkable and patient d/c home. Patient fell again on 4/26 with patient admitted with imaging revealing diverticulitis and AKI. PMH: chronic systolic CHF s/p CRT, NICM, CKD, HTN, hypothyroidism, pacemaker, dementia, and left breast cancer.     Clinical Impressions Patient 83 year old female who was admitted for above. Patient was living at home alone prior level with no AD. Currently, patient is min A for transfers with RW with daughter present in room. Patient needing cues for initation, sequencing and attention during session. Patient was noted to have decreased functional activity tolerance, decreased endurance, decreased standing balance, decreased safety awareness, and decreased knowledge of AD/AE impacting participation in ADLs. Patient would continue to benefit from skilled OT services at this time while admitted and after d/c to address noted deficits in order to improve overall safety and independence in ADLs. Patient will benefit from continued inpatient follow up therapy, <3 hours/day.       If plan is discharge home, recommend the following:   A lot of help with bathing/dressing/bathroom;A little help with walking and/or transfers;Assistance with cooking/housework;Direct supervision/assist for medications management;Assist for transportation;Help with stairs or ramp for entrance;Direct supervision/assist for financial management     Functional Status Assessment   Patient has had a recent decline in their functional status and demonstrates the ability to make significant improvements in function in a reasonable and predictable amount of time.     Equipment Recommendations   None recommended by OT       Precautions/Restrictions   Precautions Precautions: Fall Restrictions Weight Bearing Restrictions Per Provider Order: No     Mobility Bed Mobility Overal bed mobility: Needs Assistance Bed Mobility: Supine to Sit     Supine to sit: Supervision              Balance Overall balance assessment: Needs assistance   Sitting balance-Leahy Scale: Fair     Standing balance support: Reliant on assistive device for balance, Bilateral upper extremity supported Standing balance-Leahy Scale: Poor         ADL either performed or assessed with clinical judgement   ADL Overall ADL's : Needs assistance/impaired Eating/Feeding: Modified independent;Sitting Eating/Feeding Details (indicate cue type and reason): did not eat much breakfast with patient reporting she is not a morning person. patient was educated on alternatives to order, Grooming: Sitting;Brushing hair;Set up   Upper Body Bathing: Sitting;Contact guard assist   Lower Body Bathing: Sitting/lateral leans;Contact guard assist;Minimal assistance Lower Body Bathing Details (indicate cue type and reason): able to complete figure four while sitting with no c/o pain. Upper Body Dressing : Sitting;Contact guard assist   Lower Body Dressing: Sitting/lateral leans;Minimal assistance Lower Body Dressing Details (indicate cue type and reason): offbalance without BUE support on RW. Toilet Transfer: Contact guard assist;Ambulation;Rolling walker (2 wheels) Toilet Transfer Details (indicate cue type and reason): with patient needing one step commands and target to walk towards. needed cues for intiation as well. Toileting- Clothing Manipulation and Hygiene: Moderate assistance;Sit to/from stand               Vision Baseline Vision/History: 1 Wears glasses              Pertinent Vitals/Pain Pain Assessment Pain Assessment: Faces Faces Pain  Scale: No hurt     Extremity/Trunk Assessment Upper Extremity  Assessment Upper Extremity Assessment: Overall WFL for tasks assessed   Lower Extremity Assessment Lower Extremity Assessment: Defer to PT evaluation   Cervical / Trunk Assessment Cervical / Trunk Assessment: Kyphotic   Communication     Cognition Arousal: Alert Behavior During Therapy: WFL for tasks assessed/performed Cognition: Cognition impaired   Orientation impairments: Time Awareness: Online awareness impaired       OT - Cognition Comments: patient asked to walk in hallway with patient stopping evey few feet asking where we are going. patient needed destination to be able to engage in task for longer periods of time.                 Following commands: Impaired Following commands impaired: Follows one step commands with increased time, Follows multi-step commands inconsistently     Cueing  General Comments   Cueing Techniques: Verbal cues              Home Living Family/patient expects to be discharged to:: Private residence Living Arrangements: Alone Available Help at Discharge: Family;Available PRN/intermittently Type of Home: House Home Access: Stairs to enter Entrance Stairs-Number of Steps: 2   Home Layout: Able to live on main level with bedroom/bathroom     Bathroom Shower/Tub: Walk-in shower         Home Equipment: Agricultural consultant (2 wheels)          Prior Functioning/Environment Prior Level of Function : Independent/Modified Independent               ADLs Comments: patients daughter present reporting patient lives alone.    OT Problem List: Impaired balance (sitting and/or standing);Decreased knowledge of precautions;Decreased safety awareness;Decreased knowledge of use of DME or AE;Decreased activity tolerance   OT Treatment/Interventions: Self-care/ADL training;DME and/or AE instruction;Therapeutic activities;Therapeutic exercise;Balance training;Energy conservation;Patient/family education      OT Goals(Current goals  can be found in the care plan section)   Acute Rehab OT Goals Patient Stated Goal: to go home OT Goal Formulation: With patient Time For Goal Achievement: 03/28/24 Potential to Achieve Goals: Fair   OT Frequency:  Min 2X/week       AM-PAC OT "6 Clicks" Daily Activity     Outcome Measure Help from another person eating meals?: A Little Help from another person taking care of personal grooming?: A Little Help from another person toileting, which includes using toliet, bedpan, or urinal?: A Lot Help from another person bathing (including washing, rinsing, drying)?: A Little Help from another person to put on and taking off regular upper body clothing?: A Little Help from another person to put on and taking off regular lower body clothing?: A Little 6 Click Score: 17   End of Session Equipment Utilized During Treatment: Gait belt;Rolling walker (2 wheels)  Activity Tolerance: Patient tolerated treatment well Patient left: in chair;with call bell/phone within reach;with chair alarm set;with family/visitor present  OT Visit Diagnosis: Unsteadiness on feet (R26.81);Other abnormalities of gait and mobility (R26.89);Muscle weakness (generalized) (M62.81);History of falling (Z91.81)                Time: 1914-7829 OT Time Calculation (min): 27 min Charges:  OT General Charges $OT Visit: 1 Visit OT Evaluation $OT Eval Moderate Complexity: 1 Mod OT Treatments $Self Care/Home Management : 8-22 mins  Erin Heckler, MS Acute Rehabilitation Department Office# [phone]   Jame Maze 03/14/2024, 12:51 PM

## 2024-03-14 NOTE — Progress Notes (Signed)
 PROGRESS NOTE    Erin Novak  ZOX:[id number] DOB: Jun 15, 1941 DOA: 03/12/2024 PCP: Pete Brand, DO    Brief Narrative:  83 y.o. female with medical history significant for chronic systolic CHF s/p CRT-P (EF recovered 55-60%), NICM, CKD stage IIIa, HTN, HLD, hypothyroidism, dementia who presented to the ED for evaluation of low back pain and left hip pain after a fall on 4/24.  CT of the left hip, head and cervical spine were negative.  Had mild AKI and eventually discharged home.  Returns back to the ED after another unwitnessed fall, difficulty ambulating.  CT abdomen pelvis this time shows uncomplicated mid transverse diverticulitis and AKI.Aaron Aas Upon admission she was started on IV fluids which resolved her AKI and was started on IV antibiotics Rocephin and Flagyl for uncomplicated diverticulitis.  Slowly tolerating p.o. without any issues. Overall medically improving now awaiting SNF placement.  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  With IV fluids creatinine is now down to baseline 1.29.   Acute diverticulitis: Uncomplicated Appears to be uncomplicated transverse diverticulitis.  Currently on Rocephin and Flagyl.  Eventually we can transition to p.o. Augmentin.  WBCs improving.  Diet as tolerated.  Would suggest colonoscopy in 6-8 weeks if patient desires.  Colonoscopy 2014-showed diverticulosis and hemorrhoids   Falls at home/ambulatory dysfunction: 2 recent unwitnessed falls at home.  No significant injury identified. - PT/OT eval, fall precautions   Chronic systolic congestive heart  failure s/p CRT-P: EF has recovered from 20-25% to 55-60% after BiV PPM placement.  She appears volume depleted on admission. -Hopefully will be able to resume cardiac medications upon discharge   Hypertension: On multiple cardiac medications.   Hyperlipidemia: Continue atorvastatin , hold if CK is elevated.   Hypothyroidism: Continue Synthroid .   Dementia: Patient with reported history of dementia.  Will continue home Namenda at reduced dose given renal dysfunction.  Delirium precautions.  PT/OT-SNF    DVT prophylaxis: heparin  injection 5,000 Units Start: 03/13/24 0600    Code Status: Full Code Family Communication: Met with daughter at bedside Mainly awaiting SNF placement.  Subjective: Abdomen is feeling still slightly better. Examination:  General exam: Appears calm and comfortable  Respiratory system: Clear to auscultation. Respiratory effort normal. Cardiovascular system: S1 & S2 heard, RRR. No JVD, murmurs, rubs, gallops or clicks. No pedal edema. Gastrointestinal system: Abdomen is nondistended, soft and nontender. No organomegaly or masses felt. Normal bowel sounds heard. Central nervous system: Alert and oriented. No focal neurological deficits. Extremities: Symmetric 5 x 5 power. Skin: No rashes, lesions or ulcers Psychiatry: Judgement and insight appear normal. Mood & affect appropriate.                Diet Orders (From admission, onward)     Start     Ordered   03/13/24 1526  DIET SOFT Room service appropriate? Yes; Fluid consistency: Thin  Diet effective now       Question Answer Comment  Room service appropriate? Yes   Fluid consistency: Thin      03/13/24 1525            Objective:  Vitals:   03/13/24 1259 03/13/24 1456 03/13/24 1953 03/14/24 0415  BP: 112/60  (!) 120/47 (!) 118/52  Pulse: 71  66 68  Resp: 16  17 17   Temp: 97.6 F (36.4 C)  97.9 F (36.6 C) 97.9 F (36.6 C)  TempSrc: Oral  Oral Oral  SpO2: 96%  98% 95%   Weight:  79.2 kg    Height:  5\' 3"  (1.6 m)      Intake/Output Summary (Last 24 hours) at 03/14/2024 1048 Last data filed at 03/13/2024 1500 Gross per 24 hour  Intake 496.25 ml  Output --  Net 496.25 ml   Filed Weights   03/13/24 0704 03/13/24 1456  Weight: 79.2 kg 79.2 kg    Scheduled Meds:  acetaminophen   1,000 mg Oral TID   atorvastatin   10 mg Oral Daily   feeding supplement  1 Container Oral TID BM   heparin   5,000 Units Subcutaneous Q8H   levothyroxine   88 mcg Oral Q0600   memantine  5 mg Oral BID   metoprolol  succinate  100 mg Oral Daily   multivitamin with minerals  1 tablet Oral Daily   Continuous Infusions:  cefTRIAXone (ROCEPHIN)  IV 2 g (03/13/24 2053)   metronidazole 500 mg (03/14/24 1610)    Nutritional status     Body mass index is 30.93 kg/m.  Data Reviewed:   CBC: Recent Labs  Lab 03/10/24 1400 03/12/24 1637 03/13/24 0454 03/14/24 0403  WBC 12.4* 14.2* 11.7* 11.3*  NEUTROABS 9.7*  --   --   --   HGB 15.6* 14.8 13.1 12.4  HCT 46.3* 44.1 41.8 38.7  MCV 89.4 89.6 95.0 93.0  PLT 185 160 141* 136*   Basic Metabolic Panel: Recent Labs  Lab 03/10/24 1400 03/12/24 1637 03/13/24 0454 03/14/24 0403  NA 136 137 138 137  K 3.8 4.0 4.3 3.8  CL 102 100 106 105  CO2 20* 23 20* 21*  GLUCOSE 135* 108* 95 89  BUN 48* 60* 59* 41*  CREATININE 1.56* 2.46* 1.69* 1.29*  CALCIUM  9.3 9.9 8.9 8.9  MG  --   --  1.9 1.9   GFR: Estimated Creatinine Clearance: 32.3 mL/min (A) (by C-G formula based on SCr of 1.29 mg/dL (H)). Liver Function Tests: Recent Labs  Lab 03/13/24 0454  AST 15  ALT 16  ALKPHOS 85  BILITOT 0.7  PROT 6.1*  ALBUMIN 3.0*   No results for input(s): "LIPASE", "AMYLASE" in the last 168 hours. No results for input(s): "AMMONIA" in the last 168 hours. Coagulation Profile: No results for input(s): "INR", "PROTIME" in the last 168 hours. Cardiac Enzymes: Recent Labs  Lab 03/10/24 1400 03/13/24 0454  CKTOTAL 43 36*   BNP (last  3 results) No results for input(s): "PROBNP" in the last 8760 hours. HbA1C: No results for input(s): "HGBA1C" in the last 72 hours. CBG: No results for input(s): "GLUCAP" in the last 168 hours. Lipid Profile: No results for input(s): "CHOL", "HDL", "LDLCALC", "TRIG", "CHOLHDL", "LDLDIRECT" in the last 72 hours. Thyroid  Function Tests: No results for input(s): "TSH", "T4TOTAL", "FREET4", "T3FREE", "THYROIDAB" in the last 72 hours. Anemia Panel: No results for input(s): "VITAMINB12", "FOLATE", "FERRITIN", "TIBC", "IRON", "RETICCTPCT" in the last 72 hours. Sepsis Labs: Recent Labs  Lab 03/10/24 1400 03/12/24 1637  LATICACIDVEN 1.6 1.5    No results found for this or any previous visit (from the past 240 hours).       Radiology Studies: CT L-SPINE NO CHARGE Result Date: 03/12/2024 CLINICAL DATA:  Back trauma, no prior imaging (Age >= 16y) EXAM: CT LUMBAR SPINE WITHOUT CONTRAST TECHNIQUE: Multidetector CT imaging of the lumbar spine was performed without intravenous contrast administration. Multiplanar CT image reconstructions were also generated. RADIATION DOSE REDUCTION: This exam was performed according to the departmental dose-optimization program which includes automated exposure control, adjustment of the mA and/or kV according to patient size and/or use of iterative reconstruction technique. COMPARISON:  CT abdomen pelvis 03/12/2024, CT left hip 03/10/2024 FINDINGS: Segmentation: 5 lumbar type vertebrae. Alignment: Normal. Vertebrae: Multilevel severe degenerative changes of the spine with endplate sclerosis at the L1-L2, L4-L5, L5-S1 levels. Associated posterior disc osteophyte complex formation at the L1-L2, L4-L5, L5-S1 levels. At least moderate osseous neural stenosis at the L5-S1 level. No severe central canal stenosis. No acute fracture or focal pathologic process. Paraspinal and other soft tissues: Negative. Disc levels: Multilevel intervertebral disc space vacuum phenomenon.  IMPRESSION: 1. No acute displaced fracture or traumatic listhesis of the lumbar spine. 2. Please see separately dictated CT abdomen pelvis 03/12/2024. Electronically Signed   By: Morgane  Naveau M.D.   On: 03/12/2024 19:12   CT ABDOMEN PELVIS WO CONTRAST Result Date: 03/12/2024 CLINICAL DATA:  Sepsis.  Complaining of left hip pain.  Fall recent EXAM: CT ABDOMEN AND PELVIS WITHOUT CONTRAST TECHNIQUE: Multidetector CT imaging of the abdomen and pelvis was performed following the standard protocol without IV contrast. RADIATION DOSE REDUCTION: This exam was performed according to the departmental dose-optimization program which includes automated exposure control, adjustment of the mA and/or kV according to patient size and/or use of iterative reconstruction technique. COMPARISON:  None Available. FINDINGS: Lower chest: Right base atelectasis. Cardiac leads noted. Small to moderate volume hiatal hernia. Hepatobiliary: Not enlarged. No focal lesion. Status post cholecystectomy.  No biliary ductal dilatation. Pancreas: Normal pancreatic contour. No main pancreatic duct dilatation. Spleen: Not enlarged. No focal lesion. Splenule noted. Adrenals/Urinary Tract: No nodularity bilaterally. At least partially duplicated right collecting system. No hydroureteronephrosis. No nephroureterolithiasis. No contour deforming renal mass. The urinary bladder is unremarkable. Stomach/Bowel: No small bowel wall thickening or dilatation. No large bowel dilatation. Colonic diverticulosis. Mild focal diverticular wall thickening and pericolonic fat stranding along a mid transverse colon diverticula (2:44). The appendix is unremarkable. Vasculature/Lymphatic: Atherosclerotic plaque.  No abdominal aorta or iliac aneurysm. No abdominal, pelvic, inguinal lymphadenopathy. Reproductive: Status post hysterectomy.  No adnexal mass identified. Other: No simple free fluid ascites. No pneumoperitoneum. No mesenteric hematoma identified. No organized  fluid collection. Musculoskeletal: No significant soft tissue hematoma. No acute pelvic fracture. Please see separately dictated CT lumbar spine 03/12/2024. Other ports and devices: None. Other ports and devices: None. IMPRESSION: 1. Colonic diverticulosis with acute mild uncomplicated mid transverse colon diverticulitis. 2. No acute intra-abdominal or intrapelvic traumatic injury with limited evaluation on this noncontrast study. 3. No hip or pelvic fracture. 4. Please see separately dictated CT lumbar spine 03/12/2024. 5. Small to moderate volume hiatal hernia. 6.  Aortic Atherosclerosis (ICD10-I70.0). Electronically Signed   By: Morgane  Naveau M.D.   On: 03/12/2024 19:09   DG Chest Portable 1 View Result Date: 03/12/2024 CLINICAL DATA:  SIRS Pt brought in by family. Pt was lying in the back of the car on a blanket. Staff transferred pt to stretcher and placed in room 11. Daughter states she was seen prior at [facility] for hip pain, after sliding out of her chair . Still with pain EXAM: PORTABLE CHEST 1 VIEW COMPARISON:  Chest x-ray 10/30/2018, chest x-ray 12/23/2011, CT chest 10/22/2007 FINDINGS: Left chest  wall triple lead pacemaker. The heart and mediastinal contours are within normal limits. Mildly prominent hilar vasculature. No focal consolidation. Increased interstitial markings. No pleural effusion. No pneumothorax. No acute osseous abnormality. IMPRESSION: Mild pulmonary edema. Electronically Signed   By: Morgane  Naveau M.D.   On: 03/12/2024 18:58   CT Head Wo Contrast Result Date: 03/12/2024 CLINICAL DATA:  Head trauma, minor (Age >= 65y) Headache, neuro deficit. Left hip pain EXAM: CT HEAD WITHOUT CONTRAST TECHNIQUE: Contiguous axial images were obtained from the base of the skull through the vertex without intravenous contrast. RADIATION DOSE REDUCTION: This exam was performed according to the departmental dose-optimization program which includes automated exposure control, adjustment of the  mA and/or kV according to patient size and/or use of iterative reconstruction technique. COMPARISON:  CT head 03/10/2024 FINDINGS: Brain: Stable prominence of the lateral ventricles may be related to central predominant atrophy, although a component of normal pressure/communicating hydrocephalus cannot be excluded. Patchy and confluent areas of decreased attenuation are noted throughout the deep and periventricular white matter of the cerebral hemispheres bilaterally, compatible with chronic microvascular ischemic disease. No evidence of large-territorial acute infarction. No parenchymal hemorrhage. No mass lesion. No extra-axial collection. No mass effect or midline shift. No hydrocephalus. Basilar cisterns are patent. Vascular: No hyperdense vessel. Atherosclerotic calcifications are present within the cavernous internal carotid arteries. Skull: No acute fracture or focal lesion. Sinuses/Orbits: Bilateral sphenoid sinus mucosal thickening. Bilateral sphenoid sinus wall hypertrophy. Bilateral maxillary sinus mucosal thickening. Otherwise paranasal sinuses and mastoid air cells are clear. The orbits are unremarkable. Other: None. IMPRESSION: 1. No acute intracranial abnormality. 2. Stable prominence of the lateral ventricles may be related to central predominant atrophy, although a component of normal pressure/communicating hydrocephalus cannot be excluded. 3. 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 Maxillary sinus disease. Electronically Signed   By: Morgane  Naveau M.D.   On: 03/12/2024 18:55           LOS: 1 day   Time spent= 35 mins    Maggie Schooner, MD [facility]  If 7PM-7AM, please contact night-coverage  03/14/2024, 10:48 AM

## 2024-03-14 NOTE — TOC Progression Note (Addendum)
 Transition of Care [facility]) - Progression Note    Patient Details  Name: Erin Novak MRN: [id number] Date of Birth: [birth year]  Transition of Care [facility]) CM/SW Contact  Kathryn Parish, RN Phone Number: 03/14/2024, 11:25 AM  Clinical Narrative:    NCM left message for patients daughter Lavonna Prader [phone]) 12:15 PM NCM spoke with patient and her daughter Lorita Rosa [phone]) in room to discuss PT's SNF recommendation and pt is not completely agreeable to going to SNF. Will continue to discuss with her daughters. Edwina Gram has a preference for [facility] for SNF 2:30 PM FL2 done; PASRR confirmed. Initial referral faxed out for review. TOC awaiting bed offers.  4:00 PM Choice List Given:    Expected Discharge Plan: Home/Self Care Barriers to Discharge: Continued Medical Work up  Expected Discharge Plan and Services   Discharge Planning Services: CM Consult   Living arrangements for the past 2 months: Single Family Home                                       Social Determinants of Health (SDOH) Interventions SDOH Screenings   Food Insecurity: No Food Insecurity (03/13/2024)  Housing: Low Risk  (03/13/2024)  Transportation Needs: No Transportation Needs (03/13/2024)  Utilities: Not At Risk (03/13/2024)  Social Connections: Unknown (03/13/2024)  Tobacco Use: Low Risk  (03/13/2024)    Readmission Risk Interventions     No data to display

## 2024-03-15 DIAGNOSIS — I5020 Unspecified systolic (congestive) heart failure: Secondary | ICD-10-CM | POA: Diagnosis not present

## 2024-03-15 DIAGNOSIS — N189 Chronic kidney disease, unspecified: Secondary | ICD-10-CM | POA: Diagnosis not present

## 2024-03-15 DIAGNOSIS — K5733 Diverticulitis of large intestine without perforation or abscess with bleeding: Secondary | ICD-10-CM | POA: Diagnosis not present

## 2024-03-15 DIAGNOSIS — Z743 Need for continuous supervision: Secondary | ICD-10-CM | POA: Diagnosis not present

## 2024-03-15 DIAGNOSIS — R5383 Other fatigue: Secondary | ICD-10-CM | POA: Diagnosis not present

## 2024-03-15 DIAGNOSIS — M6281 Muscle weakness (generalized): Secondary | ICD-10-CM | POA: Diagnosis not present

## 2024-03-15 DIAGNOSIS — N179 Acute kidney failure, unspecified: Secondary | ICD-10-CM | POA: Diagnosis not present

## 2024-03-15 DIAGNOSIS — Z7401 Bed confinement status: Secondary | ICD-10-CM | POA: Diagnosis not present

## 2024-03-15 DIAGNOSIS — R404 Transient alteration of awareness: Secondary | ICD-10-CM | POA: Diagnosis not present

## 2024-03-15 DIAGNOSIS — E039 Hypothyroidism, unspecified: Secondary | ICD-10-CM | POA: Diagnosis not present

## 2024-03-15 DIAGNOSIS — I10 Essential (primary) hypertension: Secondary | ICD-10-CM | POA: Diagnosis not present

## 2024-03-15 DIAGNOSIS — W1830XD Fall on same level, unspecified, subsequent encounter: Secondary | ICD-10-CM | POA: Diagnosis not present

## 2024-03-15 DIAGNOSIS — R531 Weakness: Secondary | ICD-10-CM | POA: Diagnosis not present

## 2024-03-15 DIAGNOSIS — R5381 Other malaise: Secondary | ICD-10-CM | POA: Diagnosis not present

## 2024-03-15 DIAGNOSIS — R278 Other lack of coordination: Secondary | ICD-10-CM | POA: Diagnosis not present

## 2024-03-15 DIAGNOSIS — N1832 Chronic kidney disease, stage 3b: Secondary | ICD-10-CM | POA: Diagnosis not present

## 2024-03-15 DIAGNOSIS — R2689 Other abnormalities of gait and mobility: Secondary | ICD-10-CM | POA: Diagnosis not present

## 2024-03-15 DIAGNOSIS — K5792 Diverticulitis of intestine, part unspecified, without perforation or abscess without bleeding: Secondary | ICD-10-CM | POA: Diagnosis not present

## 2024-03-15 DIAGNOSIS — E785 Hyperlipidemia, unspecified: Secondary | ICD-10-CM | POA: Diagnosis not present

## 2024-03-15 LAB — CBC
HCT: 38.6 % (ref 36.0–46.0)
Hemoglobin: 12.5 g/dL (ref 12.0–15.0)
MCH: 29.8 pg (ref 26.0–34.0)
MCHC: 32.4 g/dL (ref 30.0–36.0)
MCV: 92.1 fL (ref 80.0–100.0)
Platelets: 141 10*3/uL — ABNORMAL LOW (ref 150–400)
RBC: 4.19 MIL/uL (ref 3.87–5.11)
RDW: 13.3 % (ref 11.5–15.5)
WBC: 12.4 10*3/uL — ABNORMAL HIGH (ref 4.0–10.5)
nRBC: 0 % (ref 0.0–0.2)

## 2024-03-15 LAB — BASIC METABOLIC PANEL WITH GFR
Anion gap: 10 (ref 5–15)
BUN: 20 mg/dL (ref 8–23)
CO2: 22 mmol/L (ref 22–32)
Calcium: 9.1 mg/dL (ref 8.9–10.3)
Chloride: 107 mmol/L (ref 98–111)
Creatinine, Ser: 0.94 mg/dL (ref 0.44–1.00)
GFR, Estimated: 60 mL/min — ABNORMAL LOW (ref >60)
Glucose, Bld: 95 mg/dL (ref 70–99)
Potassium: 3.8 mmol/L (ref 3.5–5.1)
Sodium: 139 mmol/L (ref 135–145)

## 2024-03-15 LAB — MAGNESIUM: Magnesium: 1.9 mg/dL (ref 1.7–2.4)

## 2024-03-15 MED ORDER — HYDROXYZINE HCL 10 MG PO TABS: 10.0000 mg | ORAL_TABLET | Freq: Three times a day (TID) | ORAL | 0 refills | Status: AC | PRN

## 2024-03-15 MED ORDER — METRONIDAZOLE 500 MG PO TABS: 500.0000 mg | ORAL_TABLET | Freq: Three times a day (TID) | ORAL | 0 refills | Status: AC

## 2024-03-15 MED ORDER — CIPROFLOXACIN HCL 500 MG PO TABS: 500.0000 mg | ORAL_TABLET | Freq: Two times a day (BID) | ORAL | 0 refills | Status: AC

## 2024-03-15 MED ORDER — ADULT MULTIVITAMIN W/MINERALS CH: 1.0000 | ORAL_TABLET | Freq: Every day | ORAL | 0 refills | Status: DC

## 2024-03-15 MED ORDER — HYDROXYZINE HCL 10 MG PO TABS
10.0000 mg | ORAL_TABLET | Freq: Once | ORAL | Status: AC
  Administered 2024-03-15: 10 mg via ORAL
  Filled 2024-03-15: qty 1

## 2024-03-15 NOTE — Progress Notes (Signed)
 Physical Therapy Treatment Patient Details Name: Erin Novak MRN: [id number] DOB: 12/21/1940 Today's Date: 03/15/2024   History of Present Illness Patient is a 83 year old female who presented on 4/24 after a fall with low back pain and L hip pain. Imaging was unremarkable and patient d/c home. Patient fell again on 4/26 with patient admitted with imaging revealing diverticulitis and AKI. PMH: chronic systolic CHF s/p CRT, NICM, CKD, HTN, hypothyroidism, pacemaker, dementia, and left breast cancer.    PT Comments  Hx dementia however Pt was AxO x 3 pleasant and motivated. Lives home alone with her cat. Assisted OOB to amb in hallway was difficult.  General transfer comment: VC's on proper hand placement as well as safety with turns. Also assisted with a toilet transfer.  Unsteady.  HIGH FALL RISK. General Gait Details: assisted with amb to and from bathroom using walker due to weakness and instability.  Prior to admit, Pt was amb Indep with NO AD.  Also required assist for direction.  Max c/o weakness and fatigue.  Assisted back to bed per Pt request to rest. Pt will need ST Rehab at SNF to address mobility and functional decline prior to safely returning home.    If plan is discharge home, recommend the following: A little help with walking and/or transfers;A little help with bathing/dressing/bathroom;Assistance with cooking/housework;Assist for transportation;Help with stairs or ramp for entrance   Can travel by private vehicle        Equipment Recommendations  None recommended by PT    Recommendations for Other Services       Precautions / Restrictions Precautions Precautions: Fall Restrictions Weight Bearing Restrictions Per Provider Order: No     Mobility  Bed Mobility Overal bed mobility: Needs Assistance Bed Mobility: Supine to Sit, Sit to Supine     Supine to sit: Contact guard, Min assist Sit to supine: Min assist   General bed mobility comments: increased time  and assistance    Transfers Overall transfer level: Needs assistance Equipment used: Rolling walker (2 wheels) Transfers: Sit to/from Stand Sit to Stand: Min assist           General transfer comment: VC's on proper hand placement as well as safety with turns. Also assisted with a toilet transfer.  Unsteady.  HIGH FALL RISK.    Ambulation/Gait Ambulation/Gait assistance: Supervision Gait Distance (Feet): 28 Feet Assistive device: None Gait Pattern/deviations: Decreased stride length, Step-through pattern, Antalgic, Decreased stance time - left Gait velocity: decreased     General Gait Details: assisted with amb to and from bathroo using walker due to weakness and instability.  Prior to admit, Pt was amb Indep with NO AD.  Also required assist for direction.  Max c/o weakness and fatigue.  Assisted back to bed per Pt request to rest.   Stairs             Wheelchair Mobility     Tilt Bed    Modified Rankin (Stroke Patients Only)       Balance                                            Communication Communication Communication: No apparent difficulties  Cognition Arousal: Alert Behavior During Therapy: WFL for tasks assessed/performed   PT - Cognitive impairments: History of cognitive impairments  PT - Cognition Comments: Hx dementia however Pt was AxO x 3 pleasant and motivated. Lives home alone with her cat. Following commands: Intact Following commands impaired: Follows one step commands with increased time, Follows multi-step commands inconsistently    Cueing Cueing Techniques: Verbal cues  Exercises      General Comments        Pertinent Vitals/Pain Pain Assessment Pain Assessment: No/denies pain    Home Living                          Prior Function            PT Goals (current goals can now be found in the care plan section) Progress towards PT goals: Progressing toward  goals    Frequency    Min 2X/week      PT Plan      Co-evaluation              AM-PAC PT "6 Clicks" Mobility   Outcome Measure  Help needed turning from your back to your side while in a flat bed without using bedrails?: A Little Help needed moving from lying on your back to sitting on the side of a flat bed without using bedrails?: A Little Help needed moving to and from a bed to a chair (including a wheelchair)?: A Little Help needed standing up from a chair using your arms (e.g., wheelchair or bedside chair)?: A Little Help needed to walk in hospital room?: A Lot Help needed climbing 3-5 steps with a railing? : A Lot 6 Click Score: 16    End of Session Equipment Utilized During Treatment: Gait belt Activity Tolerance: Patient tolerated treatment well Patient left: in bed;with call bell/phone within reach;with bed alarm set Nurse Communication: Mobility status PT Visit Diagnosis: Difficulty in walking, not elsewhere classified (R26.2)     Time: 2841-3244 PT Time Calculation (min) (ACUTE ONLY): 29 min  Charges:    $Gait Training: 8-22 mins $Therapeutic Activity: 8-22 mins PT General Charges $$ ACUTE PT VISIT: 1 Visit

## 2024-03-15 NOTE — TOC Progression Note (Signed)
 Transition of Care [facility]) - Progression Note    Patient Details  Name: Erin Novak MRN: [id number] Date of Birth: 01/05/1941  Transition of Care [facility]) CM/SW Contact  Kathryn Parish, RN Phone Number: 03/15/2024, 10:14 AM  Clinical Narrative:    Patients daughter decided on [facility]. Insurance auth started in [place] [phone])  Expected Discharge Plan: Home/Self Care Barriers to Discharge: Continued Medical Work up  Expected Discharge Plan and Services   Discharge Planning Services: CM Consult   Living arrangements for the past 2 months: Single Family Home                                       Social Determinants of Health (SDOH) Interventions SDOH Screenings   Food Insecurity: No Food Insecurity (03/13/2024)  Housing: Low Risk  (03/13/2024)  Transportation Needs: No Transportation Needs (03/13/2024)  Utilities: Not At Risk (03/13/2024)  Social Connections: Unknown (03/13/2024)  Tobacco Use: Low Risk  (03/13/2024)    Readmission Risk Interventions     No data to display

## 2024-03-15 NOTE — Progress Notes (Signed)
 Report given to Shelvy Dickens at [facility]

## 2024-03-15 NOTE — Progress Notes (Signed)
 PTAR present at bedside for transport to SNF.

## 2024-03-15 NOTE — [place] FL2 (Signed)
 [address]  MEDICAID FL2 LEVEL OF CARE FORM     IDENTIFICATION  Patient Name: Erin Novak Birthdate: [birth year] Sex: female Admission Date (Current Location): 03/12/2024  [facility] and [place] Number:  Geophysical data processor and Address:  [facility],  [address], [place] [address]      Provider Number: [id number]  Attending Physician Name and Address:  Edwena Graham, MD  Relative Name and Phone Number:       Current Level of Care: SNF Recommended Level of Care: Skilled Nursing Facility Prior Approval Number:    Date Approved/Denied:   PASRR Number: [id number]  Discharge Plan: SNF    Current Diagnoses: Patient Active Problem List   Diagnosis Date Noted   AKI (acute kidney injury) (HCC) 03/13/2024   Acute diverticulitis 03/12/2024   Acute kidney injury superimposed on chronic kidney disease (HCC) 03/12/2024   Medication management 02/17/2024   HFrEF (heart failure with reduced ejection fraction) (HCC) 05/01/2022   Stage 3b chronic kidney disease (HCC) 03/08/2020   Erroneous encounter - disregard 03/06/2020   Sinus node dysfunction (HCC) 07/12/2019   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 05/04/2019   Nonischemic cardiomyopathy (HCC) 04/28/2019   Nonrheumatic mitral valve regurgitation 04/28/2019   LBBB (left bundle branch block) 10/30/2018   Pacemaker: CRTP St Jude 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 - 10/29/2018 in situ 10/30/2018   Chronic systolic heart failure (HCC) 10/29/2018   Paroxysmal atrial fibrillation (HCC) 09/24/2018   CHF (congestive heart failure) (HCC) 09/24/2018   Hypokalemia 09/24/2018   Malignant neoplasm of upper-outer quadrant of left breast in female, estrogen receptor positive (HCC) 01/28/2017   Cholelithiasis without obstruction 07/15/2011   Gallstones 07/10/2011   GERD (gastroesophageal reflux disease) 07/10/2011   Obesity 07/10/2011   Hypothyroidism 02/07/2008   HLD (hyperlipidemia) 02/07/2008    Essential hypertension 02/07/2008    Orientation RESPIRATION BLADDER Height & Weight     Self, Time, Situation, Place  Normal Continent Weight: 82.7 kg Height:  5\' 3"  (160 cm)  BEHAVIORAL SYMPTOMS/MOOD NEUROLOGICAL BOWEL NUTRITION STATUS        Diet (see discharge summary)  AMBULATORY STATUS COMMUNICATION OF NEEDS Skin   Limited Assist Verbally Normal                       Personal Care Assistance Level of Assistance              Functional Limitations Info             SPECIAL CARE FACTORS FREQUENCY  PT (By licensed PT), OT (By licensed OT)     PT Frequency: 5X weekly OT Frequency: 5X weekly            Contractures Contractures Info: Not present    Additional Factors Info  Allergies   Allergies Info: Percodan (Oxycodone -aspirin ), Prednisone, Amoxicillin-pot Clavulanate, Lisinopril           Current Medications (03/15/2024):  This is the current hospital active medication list Current Facility-Administered Medications  Medication Dose Route Frequency Provider Last Rate Last Admin   acetaminophen  (TYLENOL ) tablet 1,000 mg  1,000 mg Oral TID Amin, Ankit C, MD   1,000 mg at 03/15/24 5621   acetaminophen  (TYLENOL ) tablet 650 mg  650 mg Oral Q6H PRN Kyere, Belinda K, NP   650 mg at 03/15/24 0413   atorvastatin  (LIPITOR) tablet 10 mg  10 mg Oral Daily Patel, Vishal R, MD   10 mg at 03/15/24 3086   cefTRIAXone (ROCEPHIN)  2 g in sodium chloride  0.9 % 100 mL IVPB  2 g Intravenous Q24H Kyere, Belinda K, NP 200 mL/hr at 03/14/24 2013 2 g at 03/14/24 2013   feeding supplement (BOOST / RESOURCE BREEZE) liquid 1 Container  1 Container Oral TID BM Amin, Ankit C, MD   1 Container at 03/15/24 1416   glucagon (human recombinant) (GLUCAGEN) injection 1 mg  1 mg Intravenous PRN Amin, Ankit C, MD       heparin  injection 5,000 Units  5,000 Units Subcutaneous Q8H Kyere, Belinda K, NP   5,000 Units at 03/15/24 1416   hydrALAZINE  (APRESOLINE ) injection 10 mg  10 mg Intravenous  Q4H PRN Amin, Ankit C, MD       ipratropium-albuterol  (DUONEB) 0.5-2.5 (3) MG/3ML nebulizer solution 3 mL  3 mL Nebulization Q4H PRN Amin, Ankit C, MD       levothyroxine  (SYNTHROID ) tablet 88 mcg  88 mcg Oral Q0600 Patel, Vishal R, MD   88 mcg at 03/15/24 0551   memantine (NAMENDA) tablet 5 mg  5 mg Oral BID Patel, Vishal R, MD   5 mg at 03/15/24 2956   metoprolol  succinate (TOPROL -XL) 24 hr tablet 100 mg  100 mg Oral Daily Patel, Vishal R, MD   100 mg at 03/15/24 2130   metoprolol  tartrate (LOPRESSOR ) injection 5 mg  5 mg Intravenous Q4H PRN Amin, Ankit C, MD       metroNIDAZOLE (FLAGYL) IVPB 500 mg  500 mg Intravenous Q12H Kyere, Belinda K, NP 100 mL/hr at 03/15/24 0803 500 mg at 03/15/24 8657   multivitamin with minerals tablet 1 tablet  1 tablet Oral Daily Kyere, Belinda K, NP   1 tablet at 03/15/24 8469   ondansetron  (ZOFRAN ) tablet 4 mg  4 mg Oral Q6H PRN Kyere, Belinda K, NP       Or   ondansetron  (ZOFRAN ) injection 4 mg  4 mg Intravenous Q6H PRN Kyere, Belinda K, NP       senna (SENOKOT) tablet 8.6 mg  1 tablet Oral Daily PRN Patel, Vishal R, MD         Discharge Medications: DISCHARGE MEDICATION: Allergies as of 03/15/2024         Reactions    Percodan [oxycodone -aspirin ] Nausea And Vomiting, Other (See Comments)    Makes pt sweat, unable to sleep    Prednisone Other (See Comments)    Makes pt sweat, unable to sleep    Amoxicillin-pot Clavulanate Dermatitis, Rash    Lisinopril Cough            Medication List       STOP taking these medications     amLODipine  10 MG tablet Commonly known as: NORVASC     furosemide  40 MG tablet Commonly known as: LASIX     HYDROcodone -acetaminophen  5-325 MG tablet Commonly known as: NORCO/VICODIN    sacubitril -valsartan  97-103 MG Commonly known as: ENTRESTO     spironolactone  50 MG tablet Commonly known as: ALDACTONE            TAKE these medications     acetaminophen  500 MG tablet Commonly known as: TYLENOL  Take 1,000 mg  by mouth every 6 (six) hours as needed for mild pain (pain score 1-3).    anastrozole  1 MG tablet Commonly known as: ARIMIDEX  TAKE 1 TABLET BY MOUTH DAILY    atorvastatin  10 MG tablet Commonly known as: LIPITOR TAKE 1 TABLET BY MOUTH DAILY    ciprofloxacin  500 MG tablet Commonly known as: Cipro  Take 1 tablet (500 mg total) by  mouth 2 (two) times daily for 8 days.    levothyroxine  88 MCG tablet Commonly known as: SYNTHROID  TAKE 1 TABLET BY MOUTH IN THE  MORNING    loratadine  10 MG tablet Commonly known as: CLARITIN  Take 10 mg by mouth daily.    memantine 10 MG tablet Commonly known as: NAMENDA Take 10 mg by mouth 2 (two) times daily.    metoprolol  succinate 100 MG 24 hr tablet Commonly known as: TOPROL -XL TAKE 1 TABLET BY MOUTH ONCE  DAILY WITH MEAL OR IMMEDIATELY  FOLLOWING    metroNIDAZOLE 500 MG tablet Commonly known as: Flagyl Take 1 tablet (500 mg total) by mouth 3 (three) times daily for 8 days.    multivitamin with minerals Tabs tablet Take 1 tablet by mouth daily. Start taking on: March 16, 2024   Please see discharge summary for a list of discharge medications.  Relevant Imaging Results:  Relevant Lab Results:   Additional Information SS# [id number]  Kathryn Parish, RN

## 2024-03-15 NOTE — Plan of Care (Signed)

## 2024-03-15 NOTE — TOC Transition Note (Signed)
 Transition of Care [facility]) - Discharge Note   Patient Details  Name: Erin Novak MRN: [id number] Date of Birth: 1941-01-20  [facility] CM/SW Contact:  Kathryn Parish, RN Phone Number: 03/15/2024, 3:46 PM   Clinical Narrative:    pt to d/c to [facility]; d/c summary and SNF reports sent via HUB,  confirmation; nurse called report # [phone] Rm # [phone] bed 1; pt's dtr's notified ; called [facility] for transport at 3:25; Discharge packet includes medical necessity, face sheet, and discharge summary. No further CM needs. TOC signing off   Final next level of care: Skilled Nursing Facility Barriers to Discharge: Barriers Resolved   Patient Goals and CMS Choice Patient states their goals for this hospitalization and ongoing recovery are:: patient's dtr Erin Novak says she is unsure CMS Medicare.gov Compare Post Acute Care list provided to:: Patient Represenative (must comment) Erin Novak (dtr))   [place] ownership interest in [facility].provided to:: Adult Children    Discharge Placement   Existing PASRR number confirmed : 03/15/24          Patient chooses bed at: [facility]) Patient to be transferred to facility by: [facility] Name of family member notified: Erin Novak and [place] daughters Patient and family notified of of transfer: 03/15/24  Discharge Plan and Services Additional resources added to the After Visit Summary for     Discharge Planning Services: CM Consult            DME Arranged: N/A DME Agency: NA       HH Arranged: NA HH Agency: NA        Social Drivers of Health (SDOH) Interventions SDOH Screenings   Food Insecurity: No Food Insecurity (03/13/2024)  Housing: Low Risk  (03/13/2024)  Transportation Needs: No Transportation Needs (03/13/2024)  Utilities: Not At Risk (03/13/2024)  Social Connections: Unknown (03/13/2024)  Tobacco Use: Low Risk  (03/13/2024)     Readmission Risk Interventions     No data to  display

## 2024-03-15 NOTE — Progress Notes (Signed)
 Patient for discharge to [facility], report given to [employer]; PIV removed, awaiting for transport.

## 2024-03-15 NOTE — Progress Notes (Signed)
 PROGRESS NOTE    Erin Novak  BJY:[id number] DOB: September 01, 1941 DOA: 03/12/2024 PCP: Pete Brand, DO    Brief Narrative:  83 y.o. female with medical history significant for chronic systolic CHF s/p CRT-P (EF recovered 55-60%), NICM, CKD stage IIIa, HTN, HLD, hypothyroidism, dementia who presented to the ED for evaluation of low back pain and left hip pain after a fall on 4/24.  CT of the left hip, head and cervical spine were negative.  Had mild AKI and eventually discharged home.  Returns back to the ED after another unwitnessed fall, difficulty ambulating.  CT abdomen pelvis this time shows uncomplicated mid transverse diverticulitis and AKI.Aaron Aas Upon admission she was started on IV fluids which resolved her AKI and was started on IV antibiotics Rocephin and Flagyl for uncomplicated diverticulitis.  Slowly tolerating p.o. without any issues. Overall medically improving now awaiting SNF placement.  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  With IV fluids creatinine is now down to baseline 1.29.  Creatinine today 0.94.   Acute diverticulitis: Uncomplicated Appears to be uncomplicated transverse diverticulitis.  Currently on Rocephin and Flagyl.  Eventually we can transition to p.o. Augmentin.  WBCs improving.  Diet as tolerated.  Would suggest colonoscopy in 6-8 weeks if patient desires.  Colonoscopy 2014-showed diverticulosis and hemorrhoids   Falls at home/ambulatory dysfunction: 2 recent unwitnessed falls at home.  No significant injury identified. - PT/OT eval, fall precautions   Chronic  systolic congestive heart failure s/p CRT-P: EF has recovered from 20-25% to 55-60% after BiV PPM placement.  She appears volume depleted on admission. -Hopefully will be able to resume cardiac medications upon discharge   Hypertension: On multiple cardiac medications.   Hyperlipidemia: Continue atorvastatin , hold if CK is elevated.   Hypothyroidism: Continue Synthroid .   Dementia: Patient with reported history of dementia.  Will continue home Namenda at reduced dose given renal dysfunction.  Delirium precautions.  PT/OT-SNF    DVT prophylaxis: heparin  injection 5,000 Units Start: 03/13/24 0600    Code Status: Full Code Family Communication: Met with daughter at bedside Mainly awaiting SNF placement.  Subjective: Abdomen is feeling still slightly better.  Denies having any nausea vomiting.  States that her appetite is still quite poor.  But is able to tolerate diet.  Patient is currently medically cleared awaiting placement in skilled nursing facility. Examination:  General exam: Appears calm and comfortable  Respiratory system: Clear to auscultation. Respiratory effort normal. Cardiovascular system: S1 & S2 heard, RRR. No JVD, murmurs, rubs, gallops or clicks. No pedal edema. Gastrointestinal system: Abdomen is nondistended, soft and nontender. No organomegaly or masses felt. Normal bowel sounds heard. Central nervous system: Alert and oriented. No focal neurological deficits. Extremities: Symmetric 5 x 5 power. Skin: No rashes, lesions or ulcers Psychiatry: Judgement and insight appear normal. Mood & affect appropriate.                Diet Orders (From admission, onward)     Start     Ordered   03/13/24 1526  DIET SOFT Room service appropriate? Yes; Fluid consistency: Thin  Diet effective now  Question Answer Comment  Room service appropriate? Yes   Fluid consistency: Thin      03/13/24 1525            Objective: Vitals:   03/15/24 0413  03/15/24 0500 03/15/24 0919 03/15/24 1158  BP: 131/62  138/64 137/61  Pulse: 77  71 70  Resp: 20   16  Temp: 98 F (36.7 C)   97.7 F (36.5 C)  TempSrc: Oral   Oral  SpO2: 97%  98% 97%  Weight:  82.7 kg    Height:       No intake or output data in the 24 hours ending 03/15/24 1311  Filed Weights   03/13/24 0704 03/13/24 1456 03/15/24 0500  Weight: 79.2 kg 79.2 kg 82.7 kg    Scheduled Meds:  acetaminophen   1,000 mg Oral TID   atorvastatin   10 mg Oral Daily   feeding supplement  1 Container Oral TID BM   heparin   5,000 Units Subcutaneous Q8H   levothyroxine   88 mcg Oral Q0600   memantine  5 mg Oral BID   metoprolol  succinate  100 mg Oral Daily   multivitamin with minerals  1 tablet Oral Daily   Continuous Infusions:  cefTRIAXone (ROCEPHIN)  IV 2 g (03/14/24 2013)   metronidazole 500 mg (03/15/24 0803)    Nutritional status     Body mass index is 32.29 kg/m.  Data Reviewed:   CBC: Recent Labs  Lab 03/10/24 1400 03/12/24 1637 03/13/24 0454 03/14/24 0403 03/15/24 0406  WBC 12.4* 14.2* 11.7* 11.3* 12.4*  NEUTROABS 9.7*  --   --   --   --   HGB 15.6* 14.8 13.1 12.4 12.5  HCT 46.3* 44.1 41.8 38.7 38.6  MCV 89.4 89.6 95.0 93.0 92.1  PLT 185 160 141* 136* 141*   Basic Metabolic Panel: Recent Labs  Lab 03/10/24 1400 03/12/24 1637 03/13/24 0454 03/14/24 0403 03/15/24 0406  NA 136 137 138 137 139  K 3.8 4.0 4.3 3.8 3.8  CL 102 100 106 105 107  CO2 20* 23 20* 21* 22  GLUCOSE 135* 108* 95 89 95  BUN 48* 60* 59* 41* 20  CREATININE 1.56* 2.46* 1.69* 1.29* 0.94  CALCIUM  9.3 9.9 8.9 8.9 9.1  MG  --   --  1.9 1.9 1.9   GFR: Estimated Creatinine Clearance: 45.4 mL/min (by C-G formula based on SCr of 0.94 mg/dL). Liver Function Tests: Recent Labs  Lab 03/13/24 0454  AST 15  ALT 16  ALKPHOS 85  BILITOT 0.7  PROT 6.1*  ALBUMIN 3.0*   No results for input(s): "LIPASE", "AMYLASE" in the last 168 hours. No results for input(s): "AMMONIA" in the last 168  hours. Coagulation Profile: No results for input(s): "INR", "PROTIME" in the last 168 hours. Cardiac Enzymes: Recent Labs  Lab 03/10/24 1400 03/13/24 0454  CKTOTAL 43 36*   BNP (last 3 results) No results for input(s): "PROBNP" in the last 8760 hours. HbA1C: No results for input(s): "HGBA1C" in the last 72 hours. CBG: No results for input(s): "GLUCAP" in the last 168 hours. Lipid Profile: No results for input(s): "CHOL", "HDL", "LDLCALC", "TRIG", "CHOLHDL", "LDLDIRECT" in the last 72 hours. Thyroid  Function Tests: No results for input(s): "TSH", "T4TOTAL", "FREET4", "T3FREE", "THYROIDAB" in the last 72 hours. Anemia Panel: No results for input(s): "VITAMINB12", "FOLATE", "FERRITIN", "TIBC", "IRON", "RETICCTPCT" in the last 72 hours. Sepsis Labs: Recent Labs  Lab 03/10/24 1400 03/12/24 1637  LATICACIDVEN 1.6 1.5    No results found  for this or any previous visit (from the past 240 hours).       Radiology Studies: No results found.          LOS: 2 days   Time spent= 35 mins    Edwena Graham, MD [facility]  If 7PM-7AM, please contact night-coverage  03/15/2024, 1:11 PM

## 2024-03-15 NOTE — Discharge Summary (Signed)
 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 female with medical history significant for chronic systolic CHF s/p CRT-P (EF recovered 55-60%), NICM, CKD stage IIIa, HTN, HLD, hypothyroidism, dementia who presented to the ED for evaluation of low back pain and left hip pain after a fall on 4/24.  CT of the left hip, head and cervical spine were negative.  Had mild AKI and eventually discharged home.  Returns back to the ED after another unwitnessed fall, difficulty ambulating.  CT abdomen pelvis this time shows uncomplicated mid transverse diverticulitis and AKI.Aaron Aas Upon admission she was started on IV fluids which resolved her AKI and was started on IV antibiotics Rocephin and Flagyl for uncomplicated diverticulitis.  Abdominal pain resolved no diarrhea.  Hemoglobin hemodynamic stable slowly tolerating p.o. without any issues.  Patient blood pressure had decreased in she was on multiple antihypertensive medications.  Currently patient is only on metoprolol .  Holding all other Hamiter hypertensive medications on discharge.  Post discharge patient has to follow-up with PCP and at that time blood pressure needs to be assessed and based on it medications can be restarted.  Physical therapy evaluated advised placement in a skilled nursing facility for rehab.  Patient is currently  being discharged to rehab.  Patient will be discharged on ciprofloxacin , Flagyl and multivitamin.   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   AKI resolved  Acute diverticulitis: Uncomplicated Diverticulitis improved, discharged on ciprofloxacin  and Flagyl.  Colonoscopy 2014-showed diverticulosis and hemorrhoids   Falls at home/ambulatory dysfunction: 2 recent unwitnessed falls at home.  No significant injury identified. - PT/OT eval, fall precautions advised discharged to skilled nursing facility.   Chronic systolic congestive heart failure s/p CRT-P: EF has recovered from 20-25% to 55-60% after BiV PPM placement.  She appears volume depleted on admission. -Hopefully will be able to resume cardiac medications upon discharge -Holding GDMT other than metoprolol  due to low blood pressure.   Hypertension: On multiple cardiac medications.   Hyperlipidemia: Continue atorvastatin , hold if CK is elevated.   Hypothyroidism: Continue Synthroid .   Dementia: Patient with reported history of dementia.  Will continue home Namenda at reduced dose given renal dysfunction.  Delirium precautions.  PT/OT-SNF    DVT prophylaxis: heparin  injection 5,000 Units Start: 03/13/24 0600    Code Status: Full Code Family Communication: Met with daughter at bedside Mainly awaiting SNF placement.  Subjective: Abdomen is feeling still slightly better. Examination:  General exam: Appears calm  and comfortable  Respiratory system: Clear to auscultation. Respiratory effort normal. Cardiovascular system: S1 &  S2 heard, RRR. No JVD, murmurs, rubs, gallops or clicks. No pedal edema. Gastrointestinal system: Abdomen is nondistended, soft and nontender. No organomegaly or masses felt. Normal bowel sounds heard. Central nervous system: Alert and oriented. No focal neurological deficits. Extremities: Symmetric 5 x 5 power. Skin: No rashes, lesions or ulcers Psychiatry: Judgement and insight appear normal. Mood & affect appropriate.   Assessment and Plan:       Pain control - [place]  Controlled Substance Reporting System database was reviewed. and patient was instructed, not to drive, operate heavy machinery, perform activities at heights, swimming or participation in water activities or provide baby-sitting services while on Pain, Sleep and Anxiety Medications; until their outpatient Physician has advised to do so again. Also recommended to not to take more than prescribed Pain, Sleep and Anxiety Medications.  Consultants: None Procedures performed: None  Disposition: Skilled nursing facility Diet recommendation:  Discharge Diet Orders (From admission, onward)     Start     Ordered   03/15/24 0000  Diet - low sodium heart healthy        03/15/24 1423           Cardiac diet DISCHARGE MEDICATION: Allergies as of 03/15/2024       Reactions   Percodan [oxycodone -aspirin ] Nausea And Vomiting, Other (See Comments)   Makes pt sweat, unable to sleep   Prednisone Other (See Comments)   Makes pt sweat, unable to sleep   Amoxicillin-pot Clavulanate Dermatitis, Rash   Lisinopril Cough        Medication List     STOP taking these medications    amLODipine  10 MG tablet Commonly known as: NORVASC    furosemide  40 MG tablet Commonly known as: LASIX    HYDROcodone -acetaminophen  5-325 MG tablet Commonly known as: NORCO/VICODIN   sacubitril -valsartan  97-103 MG Commonly known as: ENTRESTO    spironolactone  50 MG tablet Commonly known as: ALDACTONE        TAKE these medications     acetaminophen  500 MG tablet Commonly known as: TYLENOL  Take 1,000 mg by mouth every 6 (six) hours as needed for mild pain (pain score 1-3).   anastrozole  1 MG tablet Commonly known as: ARIMIDEX  TAKE 1 TABLET BY MOUTH DAILY   atorvastatin  10 MG tablet Commonly known as: LIPITOR TAKE 1 TABLET BY MOUTH DAILY   ciprofloxacin  500 MG tablet Commonly known as: Cipro  Take 1 tablet (500 mg total) by mouth 2 (two) times daily for 8 days.   levothyroxine  88 MCG tablet Commonly known as: SYNTHROID  TAKE 1 TABLET BY MOUTH IN THE  MORNING   loratadine  10 MG tablet Commonly known as: CLARITIN  Take 10 mg by mouth daily.   memantine 10 MG tablet Commonly known as: NAMENDA Take 10 mg by mouth 2 (two) times daily.   metoprolol  succinate 100 MG 24 hr tablet Commonly known as: TOPROL -XL TAKE 1 TABLET BY MOUTH ONCE  DAILY WITH MEAL OR IMMEDIATELY  FOLLOWING   metroNIDAZOLE 500 MG tablet Commonly known as: Flagyl Take 1 tablet (500 mg total) by mouth 3 (three) times daily for 8 days.   multivitamin with minerals Tabs tablet Take 1 tablet by mouth daily. Start taking on: March 16, 2024        Contact information for follow-up providers     Pete Brand, DO Follow up in 1 week(s).   Specialty: Family Medicine Contact information: [address] [place] [address] [place] [address] [phone]  Contact information for after-discharge care     Destination     [facility] .   Service: Skilled Nursing Contact information: [address] [facility] [place]  [address] [phone]                    Discharge Exam: Cleavon Curls Weights   03/13/24 0704 03/13/24 1456 03/15/24 0500  Weight: 79.2 kg 79.2 kg 82.7 kg     Condition at discharge: fair  The results of significant diagnostics from this hospitalization (including imaging, microbiology, ancillary and laboratory) are listed below for reference.   Imaging  Studies: CT L-SPINE NO CHARGE Result Date: 03/12/2024 CLINICAL DATA:  Back trauma, no prior imaging (Age >= 16y) EXAM: CT LUMBAR SPINE WITHOUT CONTRAST TECHNIQUE: Multidetector CT imaging of the lumbar spine was performed without intravenous contrast administration. Multiplanar CT image reconstructions were also generated. RADIATION DOSE REDUCTION: This exam was performed according to the departmental dose-optimization program which includes automated exposure control, adjustment of the mA and/or kV according to patient size and/or use of iterative reconstruction technique. COMPARISON:  CT abdomen pelvis 03/12/2024, CT left hip 03/10/2024 FINDINGS: Segmentation: 5 lumbar type vertebrae. Alignment: Normal. Vertebrae: Multilevel severe degenerative changes of the spine with endplate sclerosis at the L1-L2, L4-L5, L5-S1 levels. Associated posterior disc osteophyte complex formation at the L1-L2, L4-L5, L5-S1 levels. At least moderate osseous neural stenosis at the L5-S1 level. No severe central canal stenosis. No acute fracture or focal pathologic process. Paraspinal and other soft tissues: Negative. Disc levels: Multilevel intervertebral disc space vacuum phenomenon. IMPRESSION: 1. No acute displaced fracture or traumatic listhesis of the lumbar spine. 2. Please see separately dictated CT abdomen pelvis 03/12/2024. Electronically Signed   By: Morgane  Naveau M.D.   On: 03/12/2024 19:12   CT ABDOMEN PELVIS WO CONTRAST Result Date: 03/12/2024 CLINICAL DATA:  Sepsis.  Complaining of left hip pain.  Fall recent EXAM: CT ABDOMEN AND PELVIS WITHOUT CONTRAST TECHNIQUE: Multidetector CT imaging of the abdomen and pelvis was performed following the standard protocol without IV contrast. RADIATION DOSE REDUCTION: This exam was performed according to the departmental dose-optimization program which includes automated exposure control, adjustment of the mA and/or kV according to patient size and/or use of iterative  reconstruction technique. COMPARISON:  None Available. FINDINGS: Lower chest: Right base atelectasis. Cardiac leads noted. Small to moderate volume hiatal hernia. Hepatobiliary: Not enlarged. No focal lesion. Status post cholecystectomy.  No biliary ductal dilatation. Pancreas: Normal pancreatic contour. No main pancreatic duct dilatation. Spleen: Not enlarged. No focal lesion. Splenule noted. Adrenals/Urinary Tract: No nodularity bilaterally. At least partially duplicated right collecting system. No hydroureteronephrosis. No nephroureterolithiasis. No contour deforming renal mass. The urinary bladder is unremarkable. Stomach/Bowel: No small bowel wall thickening or dilatation. No large bowel dilatation. Colonic diverticulosis. Mild focal diverticular wall thickening and pericolonic fat stranding along a mid transverse colon diverticula (2:44). The appendix is unremarkable. Vasculature/Lymphatic: Atherosclerotic plaque.  No abdominal aorta or iliac aneurysm. No abdominal, pelvic, inguinal lymphadenopathy. Reproductive: Status post hysterectomy.  No adnexal mass identified. Other: No simple free fluid ascites. No pneumoperitoneum. No mesenteric hematoma identified. No organized fluid collection. Musculoskeletal: No significant soft tissue hematoma. No acute pelvic fracture. Please see separately dictated CT lumbar spine 03/12/2024. Other ports and devices: None. Other ports and devices: None. IMPRESSION: 1. Colonic diverticulosis with acute mild uncomplicated mid transverse colon diverticulitis. 2. No acute intra-abdominal or intrapelvic traumatic injury with limited evaluation on this noncontrast study. 3. No hip or pelvic fracture. 4.  Please see separately dictated CT lumbar spine 03/12/2024. 5. Small to moderate volume hiatal hernia. 6.  Aortic Atherosclerosis (ICD10-I70.0). Electronically Signed   By: Morgane  Naveau M.D.   On: 03/12/2024 19:09   DG Chest Portable 1 View Result Date: 03/12/2024 CLINICAL DATA:   SIRS Pt brought in by family. Pt was lying in the back of the car on a blanket. Staff transferred pt to stretcher and placed in room 11. Daughter states she was seen prior at [facility] for hip pain, after sliding out of her chair . Still with pain EXAM: PORTABLE CHEST 1 VIEW COMPARISON:  Chest x-ray 10/30/2018, chest x-ray 12/23/2011, CT chest 10/22/2007 FINDINGS: Left chest wall triple lead pacemaker. The heart and mediastinal contours are within normal limits. Mildly prominent hilar vasculature. No focal consolidation. Increased interstitial markings. No pleural effusion. No pneumothorax. No acute osseous abnormality. IMPRESSION: Mild pulmonary edema. Electronically Signed   By: Morgane  Naveau M.D.   On: 03/12/2024 18:58   CT Head Wo Contrast Result Date: 03/12/2024 CLINICAL DATA:  Head trauma, minor (Age >= 65y) Headache, neuro deficit. Left hip pain EXAM: CT HEAD WITHOUT CONTRAST TECHNIQUE: Contiguous axial images were obtained from the base of the skull through the vertex without intravenous contrast. RADIATION DOSE REDUCTION: This exam was performed according to the departmental dose-optimization program which includes automated exposure control, adjustment of the mA and/or kV according to patient size and/or use of iterative reconstruction technique. COMPARISON:  CT head 03/10/2024 FINDINGS: Brain: Stable prominence of the lateral ventricles may be related to central predominant atrophy, although a component of normal pressure/communicating hydrocephalus cannot be excluded. Patchy and confluent areas of decreased attenuation are noted throughout the deep and periventricular white matter of the cerebral hemispheres bilaterally, compatible with chronic microvascular ischemic disease. No evidence of large-territorial acute infarction. No parenchymal hemorrhage. No mass lesion. No extra-axial collection. No mass effect or midline shift. No hydrocephalus. Basilar cisterns are patent. Vascular: No hyperdense  vessel. Atherosclerotic calcifications are present within the cavernous internal carotid arteries. Skull: No acute fracture or focal lesion. Sinuses/Orbits: Bilateral sphenoid sinus mucosal thickening. Bilateral sphenoid sinus wall hypertrophy. Bilateral maxillary sinus mucosal thickening. Otherwise paranasal sinuses and mastoid air cells are clear. The orbits are unremarkable. Other: None. IMPRESSION: 1. No acute intracranial abnormality. 2. Stable prominence of the lateral ventricles may be related to central predominant atrophy, although a component of normal pressure/communicating hydrocephalus cannot be excluded. 3. 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 Maxillary sinus disease. Electronically Signed   By: Morgane  Naveau M.D.   On: 03/12/2024 18:55   CT Hip Left Wo Contrast Result Date: 03/10/2024 CLINICAL DATA:  left hip pain EXAM: CT OF THE LEFT HIP WITHOUT CONTRAST TECHNIQUE: Multidetector CT imaging of the left hip was performed according to the standard protocol. Multiplanar CT image reconstructions were also generated. RADIATION DOSE REDUCTION: This exam was performed according to the departmental dose-optimization program which includes automated exposure control, adjustment of the mA and/or kV according to patient size and/or use of iterative reconstruction technique. COMPARISON:  X-ray left hip 03/10/2024 FINDINGS: Bones/Joint/Cartilage No evidence of fracture, dislocation, or joint effusion. No evidence of severe arthropathy. No aggressive appearing focal bone abnormality. Ligaments Suboptimally assessed by CT. Muscles and Tendons Grossly unremarkable. Soft tissues No large hematoma formation. Other: Colonic diverticulosis.  Atherosclerotic plaque. IMPRESSION: Negative for acute traumatic injury. Electronically Signed   By: Morgane  Naveau M.D.   On: 03/10/2024 20:36   DG Hip Unilat W or Wo Pelvis 2-3 Views Left  Result Date: 03/10/2024 CLINICAL DATA:  fall, left hip pain EXAM: DG HIP (WITH OR  WITHOUT PELVIS) 2-3V LEFT COMPARISON:  None Available. FINDINGS: Diffuse osteopenia. Multilevel degenerative disc disease of the lumbar spine. There is a subtle lucency within the superior aspect of the left femoral neck without definite cortical disruption. No dislocation of the hips. The pelvic bones are otherwise intact without fracture. IMPRESSION: Subtle lucency within the superior aspect of the left femoral neck, without definite cortical destruction. This could represent a nondisplaced fracture or nutrient foramen. A follow-up CT of the left hip is recommended for further characterization. Electronically Signed   By: Rance Burrows M.D.   On: 03/10/2024 19:04   CT Head Wo Contrast Result Date: 03/10/2024 CLINICAL DATA:  Head trauma, moderate-severe; Neck trauma (Age >= 65y) t was found by family "slid out of her chair." Pt complaining of left hip pain. Pt able to move both legs. EMS gave 100 mcg fentanyl  and 4 mg zofran  en route. Pt still rates pain 10/10 at this time. EXAM: CT HEAD WITHOUT CONTRAST CT CERVICAL SPINE WITHOUT CONTRAST TECHNIQUE: Multidetector CT imaging of the head and cervical spine was performed following the standard protocol without intravenous contrast. Multiplanar CT image reconstructions of the cervical spine were also generated. RADIATION DOSE REDUCTION: This exam was performed according to the departmental dose-optimization program which includes automated exposure control, adjustment of the mA and/or kV according to patient size and/or use of iterative reconstruction technique. COMPARISON:  None Available. FINDINGS: CT HEAD FINDINGS Brain: Prominence of the lateral ventricles may be related to central predominant atrophy, although a component of normal pressure/communicating hydrocephalus cannot be excluded. Patchy and confluent areas of decreased attenuation are noted throughout the deep and periventricular white matter of the cerebral hemispheres bilaterally, compatible with  chronic microvascular ischemic disease. no evidence of large-territorial acute infarction. No parenchymal hemorrhage. No mass lesion. No extra-axial collection. No mass effect or midline shift. No hydrocephalus. Basilar cisterns are patent. Vascular: No hyperdense vessel. Skull: No acute fracture or focal lesion. Sinuses/Orbits: Bilateral maxillary sinus mucosal thickening. Bilateral sphenoid sinus mucosal thickening with hypertrophy of the sphenoid sinus walls. Otherwise paranasal sinuses and mastoid air cells are clear. The orbits are unremarkable. Other: None. CT CERVICAL SPINE FINDINGS Alignment: Grade 1 anterolisthesis of C3 on C4. Skull base and vertebrae: Multilevel severe degenerative changes of the spine. Associated multilevel moderate to severe osseous neural foraminal stenosis. No severe osseous central canal stenosis. No acute fracture. No aggressive appearing focal osseous lesion or focal pathologic process. Soft tissues and spinal canal: No prevertebral fluid or swelling. No visible canal hematoma. Upper chest: Right apical subpleural micronodule. Other: None. IMPRESSION: 1. No acute intracranial abnormality. 2. No acute displaced fracture or traumatic listhesis of the cervical spine. 3. Prominence of the lateral ventricles may be related to central predominant atrophy, although a component of normal pressure/communicating hydrocephalus cannot be excluded. 4. Multilevel severe degenerative changes of the spine. Associated multilevel moderate to severe osseous neural foraminal stenosis. No severe osseous central canal stenosis. Electronically Signed   By: Morgane  Naveau M.D.   On: 03/10/2024 17:38   CT Cervical Spine Wo Contrast Result Date: 03/10/2024 CLINICAL DATA:  Head trauma, moderate-severe; Neck trauma (Age >= 65y) t was found by family "slid out of her chair." Pt complaining of left hip pain. Pt able to move both legs. EMS gave 100 mcg fentanyl  and 4 mg zofran  en route. Pt still rates pain  10/10 at this time. EXAM: CT HEAD WITHOUT  CONTRAST CT CERVICAL SPINE WITHOUT CONTRAST TECHNIQUE: Multidetector CT imaging of the head and cervical spine was performed following the standard protocol without intravenous contrast. Multiplanar CT image reconstructions of the cervical spine were also generated. RADIATION DOSE REDUCTION: This exam was performed according to the departmental dose-optimization program which includes automated exposure control, adjustment of the mA and/or kV according to patient size and/or use of iterative reconstruction technique. COMPARISON:  None Available. FINDINGS: CT HEAD FINDINGS Brain: Prominence of the lateral ventricles may be related to central predominant atrophy, although a component of normal pressure/communicating hydrocephalus cannot be excluded. Patchy and confluent areas of decreased attenuation are noted throughout the deep and periventricular white matter of the cerebral hemispheres bilaterally, compatible with chronic microvascular ischemic disease. no evidence of large-territorial acute infarction. No parenchymal hemorrhage. No mass lesion. No extra-axial collection. No mass effect or midline shift. No hydrocephalus. Basilar cisterns are patent. Vascular: No hyperdense vessel. Skull: No acute fracture or focal lesion. Sinuses/Orbits: Bilateral maxillary sinus mucosal thickening. Bilateral sphenoid sinus mucosal thickening with hypertrophy of the sphenoid sinus walls. Otherwise paranasal sinuses and mastoid air cells are clear. The orbits are unremarkable. Other: None. CT CERVICAL SPINE FINDINGS Alignment: Grade 1 anterolisthesis of C3 on C4. Skull base and vertebrae: Multilevel severe degenerative changes of the spine. Associated multilevel moderate to severe osseous neural foraminal stenosis. No severe osseous central canal stenosis. No acute fracture. No aggressive appearing focal osseous lesion or focal pathologic process. Soft tissues and spinal canal: No  prevertebral fluid or swelling. No visible canal hematoma. Upper chest: Right apical subpleural micronodule. Other: None. IMPRESSION: 1. No acute intracranial abnormality. 2. No acute displaced fracture or traumatic listhesis of the cervical spine. 3. Prominence of the lateral ventricles may be related to central predominant atrophy, although a component of normal pressure/communicating hydrocephalus cannot be excluded. 4. Multilevel severe degenerative changes of the spine. Associated multilevel moderate to severe osseous neural foraminal stenosis. No severe osseous central canal stenosis. Electronically Signed   By: Morgane  Naveau M.D.   On: 03/10/2024 17:38    Microbiology: Results for orders placed or performed during the hospital encounter of 10/29/18  Surgical PCR screen     Status: None   Collection Time: 10/29/18  6:06 AM   Specimen: Nasal Mucosa; Nasal Swab  Result Value Ref Range Status   MRSA, PCR NEGATIVE NEGATIVE Final   Staphylococcus aureus NEGATIVE NEGATIVE Final    Comment: (NOTE) The Xpert SA Assay (FDA approved for NASAL specimens in patients 9 years of age and older), is one component of a comprehensive surveillance program. It is not intended to diagnose infection nor to guide or monitor treatment. Performed at [facility], [address], [place], [place] [address]     Labs: CBC: Recent Labs  Lab 03/10/24 1400 03/12/24 1637 03/13/24 0454 03/14/24 0403 03/15/24 0406  WBC 12.4* 14.2* 11.7* 11.3* 12.4*  NEUTROABS 9.7*  --   --   --   --   HGB 15.6* 14.8 13.1 12.4 12.5  HCT 46.3* 44.1 41.8 38.7 38.6  MCV 89.4 89.6 95.0 93.0 92.1  PLT 185 160 141* 136* 141*   Basic Metabolic Panel: Recent Labs  Lab 03/10/24 1400 03/12/24 1637 03/13/24 0454 03/14/24 0403 03/15/24 0406  NA 136 137 138 137 139  K 3.8 4.0 4.3 3.8 3.8  CL 102 100 106 105 107  CO2 20* 23 20* 21* 22  GLUCOSE 135* 108* 95 89 95  BUN 48* 60* 59* 41* 20  CREATININE 1.56* 2.46* 1.69*  1.29* 0.94  CALCIUM  9.3 9.9 8.9 8.9 9.1  MG  --   --  1.9 1.9 1.9   Liver Function Tests: Recent Labs  Lab 03/13/24 0454  AST 15  ALT 16  ALKPHOS 85  BILITOT 0.7  PROT 6.1*  ALBUMIN 3.0*   CBG: No results for input(s): "GLUCAP" in the last 168 hours.  Discharge time spent: greater than 30 minutes.  Signed: Edwena Graham, MD [facility] 03/15/2024

## 2024-03-21 ENCOUNTER — Other Ambulatory Visit: Payer: Self-pay | Admitting: Licensed Clinical Social Worker

## 2024-03-21 DIAGNOSIS — K5733 Diverticulitis of large intestine without perforation or abscess with bleeding: Secondary | ICD-10-CM | POA: Diagnosis not present

## 2024-03-21 DIAGNOSIS — R5381 Other malaise: Secondary | ICD-10-CM | POA: Diagnosis not present

## 2024-03-21 NOTE — Patient Instructions (Signed)
 Visit Information  Thank you for taking time to visit with me today. Please don't hesitate to contact me if I can be of assistance to you before our next scheduled appointment.  Our next appointment is by telephone on 5/14 at 3:30 PM Please call the care guide team at [phone] if you need to cancel or reschedule your appointment.   Following is a copy of your care plan:   Goals Addressed             This Visit's Progress    LCSW VBCI Social Work Care Plan   On track    Problems:   ADL IADL limitations and Level of care concerns  CSW Clinical Goal(s):   Over the next 60 days the Caregiver will attend all scheduled medical appointments as evidenced by patient report and care team review of appointment completion in electronic MEDICAL RECORD NUMBER  work with Child psychotherapist to address concerns related to ADL IADL limitations/level of concerns .  Interventions:  Level of Care Concerns in a patient with CHF and CKD Stage 3b Current level of care: home, alone Evaluation of patient's unmet needs in current living environment ADL's Assessed needs, level of care concerns, how currently meeting needs and barriers to care Discuss community support options (Family are interested in [facility] services once pt is discharged from SNF) Discussed private pay options for personal care needs (Family will further address at next appt, once patient has discharged home) Reviewed basic eligibility and provided education on Personal Care Service process, Solution-Focused Strategies employed:  Facility  Assessed needs and reviewed facility placement process; as well as the different levels of care Family are not interested in long-term placement at this time  Patient Goals/Self-Care Activities:  Continue taking your medication as prescribed.   Coordinate with SNF to assist with ordering [facility] services at discharge. Increase coping skills and healthy habits  Plan:   Telephone follow up appointment with  care management team member scheduled for:  1-2 weeks        Please call the Suicide and Crisis Lifeline: 988 call 911 if you are experiencing a Mental Health or Behavioral Health Crisis or need someone to talk to.  Patient verbalizes understanding of instructions and care plan provided today and agrees to view in MyChart. Active MyChart status and patient understanding of how to access instructions and care plan via MyChart confirmed with patient.     Arlis Bent [facility]  [facility], [facility] Clinical Social Worker Direct Dial: [phone]  Fax: [phone] Website: [email or web address] 10:06 AM

## 2024-03-21 NOTE — Patient Outreach (Signed)
 Complex Care Management   Visit Note  03/21/2024  Name:  Erin Novak MRN: [id number] DOB: [birth year]  Situation: Referral received for Complex Care Management related to Heart Failure and CKD  I obtained verbal consent from Caregiver.  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 No VHR episodes. Health trends (patient activity & average heart rates) are stable. CorVue impedance monitoring does not suggest recent fluid accumulation. longevity is 6.4 - 7.0 years. RA pacing is 83 %, RV pacing is >99 %, and LV pacing is >99 %.   Personal history of radiation therapy 2018   PONV (postoperative nausea and vomiting)    Seasonal allergies    Sinus node dysfunction (HCC) 07/12/2019   Thyroid  disease    hypothyroid    Assessment: Patient Reported Symptoms:  Cognitive Cognitive Status: Alert and oriented to person, place, and time, Normal speech and language skills (Per family) Cognitive/Intellectual Conditions Management [RPT]: None reported or documented in medical history or problem list      Neurological Neurological Review of Symptoms: No symptoms reported    HEENT HEENT Symptoms Reported: No symptoms reported      Cardiovascular Cardiovascular Symptoms Reported: No symptoms  reported Cardiovascular Conditions: Heart failure Cardiovascular Management Strategies: Routine screening, Medication therapy  Respiratory Respiratory Symptoms Reported: No symptoms reported    Endocrine Patient reports the following symptoms related to hypoglycemia or hyperglycemia : No symptoms reported Is patient diabetic?: No    Gastrointestinal Gastrointestinal Symptoms Reported: No symptoms reported      Genitourinary Genitourinary Symptoms Reported: No symptoms reported Genitourinary Conditions: Chronic kidney disease  Integumentary Integumentary Symptoms Reported: No symptoms reported    Musculoskeletal Musculoskelatal Symptoms Reviewed: No symptoms reported   Falls in the past year?: Yes Number of falls in past year: 1 or less Was there an injury with Fall?: No Fall Risk Category Calculator: 1 Patient Fall Risk Level: Low Fall Risk Patient at Risk for Falls Due to: History of fall(s) Fall risk Follow up: Falls prevention discussed  Psychosocial Psychosocial Symptoms Reported: No symptoms reported Behavioral Health Self-Management Outcome: 4 (good) Major Change/Loss/Stressor/Fears (CP): Medical condition, self Quality of Family Relationships: helpful, involved, supportive      07/08/2017    9:10 AM  Depression screen PHQ 2/9  Decreased Interest 0  Down, Depressed, Hopeless 0  PHQ - 2 Score 0    There were no vitals filed for this visit.  Medications Reviewed Today     Reviewed by Adriana Albany, LCSW (Social Worker) on 03/21/24 at [id number]  Med List Status: <None>   Medication Order Taking? Sig Documenting Provider Last Dose Status Informant  acetaminophen  (TYLENOL ) 500 MG tablet [id number]  Yes Take 1,000 mg by mouth every 6 (six) hours as needed for mild pain (pain score 1-3). [provider] Taking Active Family Member  anastrozole  (ARIMIDEX ) 1 MG tablet [id number] Yes TAKE 1 TABLET BY MOUTH DAILY Patwardhan, Manish J, MD Taking Active Family Member   atorvastatin  (LIPITOR) 10 MG tablet [id number] Yes TAKE 1 TABLET BY MOUTH DAILY Patwardhan, Manish J, MD Taking Active Family Member  ciprofloxacin  (CIPRO ) 500 MG tablet [id number] No Take 1 tablet (500 mg total) by mouth 2 (two) times daily for 8 days.  Patient not taking: Reported on 03/21/2024   Edwena Graham, MD Not Taking Active   hydrOXYzine  (ATARAX ) 10 MG tablet [id number] No Take 1 tablet (10 mg total) by mouth 3 (three) times daily as needed for up to 10 days for anxiety.  Patient not taking: Reported on 03/21/2024   Edwena Graham, MD Not Taking Active   levothyroxine  (SYNTHROID ) 88 MCG tablet [id number] Yes TAKE 1 TABLET BY MOUTH IN THE  MORNING Patwardhan, Manish J, MD Taking Active Family Member  memantine  (NAMENDA ) 10 MG tablet [id number] Yes Take 10 mg by mouth 2 (two) times daily. [provider] Taking Active Family Member  metoprolol  succinate (TOPROL -XL) 100 MG 24 hr tablet [id number] Yes TAKE 1 TABLET BY MOUTH ONCE  DAILY WITH MEAL OR IMMEDIATELY  FOLLOWING Patwardhan, Manish J, MD Taking Active Family Member  metroNIDAZOLE  (FLAGYL ) 500 MG tablet [id number] No Take 1 tablet (500 mg total) by mouth 3 (three) times daily for 8 days.  Patient not taking: Reported on 03/21/2024   Edwena Graham, MD Not Taking Active   Multiple Vitamin (MULTIVITAMIN WITH MINERALS) TABS tablet [id number] No Take 1 tablet by mouth daily.  Patient not taking: Reported on 03/21/2024   Edwena Graham, MD Not Taking Active   Med List Note (Ward, Angelica, CPhT 03/10/24 1849): Anything long term medication-wise should be sent to [facility]            Recommendation:   Continue utilizing strategies discussed to assist with symptom management  Follow Up Plan:   Telephone follow-up in 1 week  Alease Hunter, LCSW [place]  [facility] Clinical Social Worker Direct Dial: [phone]  Fax: [phone] Website: [email or web address] 10:05  AM

## 2024-03-23 DIAGNOSIS — K5733 Diverticulitis of large intestine without perforation or abscess with bleeding: Secondary | ICD-10-CM | POA: Diagnosis not present

## 2024-03-23 DIAGNOSIS — N1832 Chronic kidney disease, stage 3b: Secondary | ICD-10-CM | POA: Diagnosis not present

## 2024-03-23 DIAGNOSIS — K5792 Diverticulitis of intestine, part unspecified, without perforation or abscess without bleeding: Secondary | ICD-10-CM | POA: Diagnosis not present

## 2024-03-24 DIAGNOSIS — N1832 Chronic kidney disease, stage 3b: Secondary | ICD-10-CM | POA: Diagnosis not present

## 2024-03-24 DIAGNOSIS — E039 Hypothyroidism, unspecified: Secondary | ICD-10-CM | POA: Diagnosis not present

## 2024-03-25 DIAGNOSIS — E039 Hypothyroidism, unspecified: Secondary | ICD-10-CM | POA: Diagnosis not present

## 2024-03-25 DIAGNOSIS — N1832 Chronic kidney disease, stage 3b: Secondary | ICD-10-CM | POA: Diagnosis not present

## 2024-03-25 DIAGNOSIS — R5381 Other malaise: Secondary | ICD-10-CM | POA: Diagnosis not present

## 2024-03-25 DIAGNOSIS — I5020 Unspecified systolic (congestive) heart failure: Secondary | ICD-10-CM | POA: Diagnosis not present

## 2024-03-25 DIAGNOSIS — K5733 Diverticulitis of large intestine without perforation or abscess with bleeding: Secondary | ICD-10-CM | POA: Diagnosis not present

## 2024-03-25 DIAGNOSIS — I10 Essential (primary) hypertension: Secondary | ICD-10-CM | POA: Diagnosis not present

## 2024-03-25 DIAGNOSIS — K5792 Diverticulitis of intestine, part unspecified, without perforation or abscess without bleeding: Secondary | ICD-10-CM | POA: Diagnosis not present

## 2024-03-25 DIAGNOSIS — M6281 Muscle weakness (generalized): Secondary | ICD-10-CM | POA: Diagnosis not present

## 2024-03-30 ENCOUNTER — Other Ambulatory Visit: Payer: Self-pay | Admitting: Licensed Clinical Social Worker

## 2024-03-31 DIAGNOSIS — M545 Low back pain, unspecified: Secondary | ICD-10-CM | POA: Diagnosis not present

## 2024-03-31 DIAGNOSIS — Z95810 Presence of automatic (implantable) cardiac defibrillator: Secondary | ICD-10-CM | POA: Diagnosis not present

## 2024-03-31 DIAGNOSIS — E7800 Pure hypercholesterolemia, unspecified: Secondary | ICD-10-CM | POA: Diagnosis not present

## 2024-03-31 DIAGNOSIS — I429 Cardiomyopathy, unspecified: Secondary | ICD-10-CM | POA: Diagnosis not present

## 2024-03-31 DIAGNOSIS — Z556 Problems related to health literacy: Secondary | ICD-10-CM | POA: Diagnosis not present

## 2024-03-31 DIAGNOSIS — E039 Hypothyroidism, unspecified: Secondary | ICD-10-CM | POA: Diagnosis not present

## 2024-03-31 DIAGNOSIS — J45909 Unspecified asthma, uncomplicated: Secondary | ICD-10-CM | POA: Diagnosis not present

## 2024-03-31 DIAGNOSIS — Z09 Encounter for follow-up examination after completed treatment for conditions other than malignant neoplasm: Secondary | ICD-10-CM | POA: Diagnosis not present

## 2024-03-31 DIAGNOSIS — I480 Paroxysmal atrial fibrillation: Secondary | ICD-10-CM | POA: Diagnosis not present

## 2024-03-31 DIAGNOSIS — N1832 Chronic kidney disease, stage 3b: Secondary | ICD-10-CM | POA: Diagnosis not present

## 2024-03-31 DIAGNOSIS — M51362 Other intervertebral disc degeneration, lumbar region with discogenic back pain and lower extremity pain: Secondary | ICD-10-CM | POA: Diagnosis not present

## 2024-03-31 DIAGNOSIS — R296 Repeated falls: Secondary | ICD-10-CM | POA: Diagnosis not present

## 2024-03-31 DIAGNOSIS — I129 Hypertensive chronic kidney disease with stage 1 through stage 4 chronic kidney disease, or unspecified chronic kidney disease: Secondary | ICD-10-CM | POA: Diagnosis not present

## 2024-04-05 DIAGNOSIS — E7800 Pure hypercholesterolemia, unspecified: Secondary | ICD-10-CM | POA: Diagnosis not present

## 2024-04-05 DIAGNOSIS — J45909 Unspecified asthma, uncomplicated: Secondary | ICD-10-CM | POA: Diagnosis not present

## 2024-04-05 DIAGNOSIS — I429 Cardiomyopathy, unspecified: Secondary | ICD-10-CM | POA: Diagnosis not present

## 2024-04-05 DIAGNOSIS — M545 Low back pain, unspecified: Secondary | ICD-10-CM | POA: Diagnosis not present

## 2024-04-05 DIAGNOSIS — E039 Hypothyroidism, unspecified: Secondary | ICD-10-CM | POA: Diagnosis not present

## 2024-04-05 DIAGNOSIS — N1832 Chronic kidney disease, stage 3b: Secondary | ICD-10-CM | POA: Diagnosis not present

## 2024-04-05 DIAGNOSIS — Z556 Problems related to health literacy: Secondary | ICD-10-CM | POA: Diagnosis not present

## 2024-04-05 DIAGNOSIS — I480 Paroxysmal atrial fibrillation: Secondary | ICD-10-CM | POA: Diagnosis not present

## 2024-04-05 DIAGNOSIS — Z95810 Presence of automatic (implantable) cardiac defibrillator: Secondary | ICD-10-CM | POA: Diagnosis not present

## 2024-04-05 DIAGNOSIS — R296 Repeated falls: Secondary | ICD-10-CM | POA: Diagnosis not present

## 2024-04-05 DIAGNOSIS — I129 Hypertensive chronic kidney disease with stage 1 through stage 4 chronic kidney disease, or unspecified chronic kidney disease: Secondary | ICD-10-CM | POA: Diagnosis not present

## 2024-04-05 NOTE — Patient Outreach (Signed)
 Complex Care Management   Visit Note  04/05/2024  Name:  Erin Novak MRN: [id number] DOB: 1941/02/17  Situation: Referral received for Complex Care Management related to Heart Failure and Chronic Kidney Disease I obtained verbal consent from Caregiver.  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 No VHR episodes. Health trends (patient activity & average heart rates) are stable. CorVue impedance monitoring does not suggest recent fluid accumulation. longevity is 6.4 - 7.0 years. RA pacing is 83 %, RV pacing is >99 %, and LV pacing is >99 %.   Personal history of radiation therapy 2018   PONV (postoperative nausea and vomiting)    Seasonal allergies    Sinus node dysfunction (HCC) 07/12/2019   Thyroid  disease    hypothyroid    Assessment: Patient Reported Symptoms:  Cognitive Cognitive Status: Alert and oriented to person, place, and time, Normal speech and language skills Cognitive/Intellectual Conditions Management [RPT]: None reported or documented in medical history or problem list   Health Maintenance Behaviors: Exercise Health Facilitated by: Rest  Neurological Neurological Review of Symptoms: No symptoms reported    HEENT HEENT Symptoms Reported: No symptoms reported       Cardiovascular Cardiovascular Symptoms Reported: No symptoms reported Cardiovascular Conditions: Hypertension, Heart failure Cardiovascular Management Strategies: Medication therapy, Routine screening  Respiratory Respiratory Symptoms Reported: No symptoms reported    Endocrine Patient reports the following symptoms related to hypoglycemia or hyperglycemia : No symptoms reported Is patient diabetic?: No    Gastrointestinal Gastrointestinal Symptoms Reported: No symptoms reported      Genitourinary Genitourinary Symptoms Reported: No symptoms reported Genitourinary Conditions: Chronic kidney disease  Integumentary Integumentary Symptoms Reported: No symptoms reported    Musculoskeletal Musculoskelatal Symptoms Reviewed: Muscle pain Additional Musculoskeletal Details: Per family, pt complains of leg and back pain Musculoskeletal Conditions: Back pain Falls in the past year?: Yes Number of falls in past year: 1 or less Was there an injury with Fall?: No Fall Risk Category Calculator: 1 Patient Fall Risk Level: Low Fall Risk    Psychosocial Psychosocial Symptoms Reported: No symptoms reported Additional Psychological Details: Patient was discharged from SNF on 5/9. She is happy to be home and is receiving strong support from adult daughters Behavioral Management Strategies: Support system, Coping strategies, Adequate rest, Medication therapy Major Change/Loss/Stressor/Fears (CP): Medical condition, self        07/08/2017    9:10 AM  Depression screen PHQ 2/9  Decreased Interest 0  Down, Depressed, Hopeless 0  PHQ - 2 Score 0    There were no vitals filed for this visit.  Medications Reviewed Today     Reviewed by Adriana Albany, LCSW (Social Worker) on 03/30/24 at [id number]  Med List Status: <None>   Medication Order Taking? Sig Documenting Provider Last Dose Status Informant  acetaminophen  (TYLENOL ) 500 MG tablet [id number] No Take 1,000 mg by mouth every 6 (six) hours as  needed for mild pain (pain score 1-3). [provider] Taking Active Family Member  anastrozole  (ARIMIDEX ) 1 MG tablet [id number] No TAKE 1 TABLET BY MOUTH DAILY Patwardhan, Manish J, MD Taking Active Family Member  atorvastatin  (LIPITOR) 10 MG tablet [id number] No TAKE 1 TABLET BY MOUTH DAILY Patwardhan, Manish J, MD Taking Active Family Member  levothyroxine  (SYNTHROID ) 88 MCG tablet [id number] No TAKE 1 TABLET BY MOUTH IN THE  MORNING Patwardhan, Manish J, MD Taking Active Family Member  memantine  (NAMENDA ) 10 MG tablet [id number] No Take 10 mg by mouth 2 (two) times daily. [provider] Taking Active Family Member  metoprolol  succinate (TOPROL -XL) 100 MG 24 hr tablet [id number] No TAKE 1 TABLET BY MOUTH ONCE  DAILY WITH MEAL OR IMMEDIATELY  FOLLOWING Patwardhan, Kaye Parsons, MD Taking Active Family Member  Multiple Vitamin (MULTIVITAMIN WITH MINERALS) TABS tablet [id number] No Take 1 tablet by mouth daily.  Patient not taking: Reported on 03/21/2024   Edwena Graham, MD Not Taking Active   Med List Note (Ward, Angelica, CPhT 03/10/24 1849): Anything long term medication-wise should be sent to [facility]            Recommendation:   PCP Follow-up  Follow Up Plan:   Telephone follow-up 2-4 weeks  Alease Hunter, LCSW [place]  [facility], [facility] Clinical Social Worker Direct Dial: [phone]  Fax: [phone] Website: [email or web address] 5:38 AM

## 2024-04-05 NOTE — Patient Instructions (Signed)
 Visit Information  Thank you for taking time to visit with me today. Please don't hesitate to contact me if I can be of assistance to you before our next scheduled appointment.  Your next care management appointment is by telephone on 6/4 at 12:30 PM  Please call the care guide team at [phone] if you need to cancel, schedule, or reschedule an appointment.   Please call the Suicide and Crisis Lifeline: 988 call 911 if you are experiencing a Mental Health or Behavioral Health Crisis or need someone to talk to.  Alease Hunter, LCSW [place]  [facility], [facility] Clinical Social Worker Direct Dial: [phone]  Fax: [phone] Website: [email or web address] 5:38 AM

## 2024-04-06 DIAGNOSIS — N1832 Chronic kidney disease, stage 3b: Secondary | ICD-10-CM | POA: Diagnosis not present

## 2024-04-06 DIAGNOSIS — Z95810 Presence of automatic (implantable) cardiac defibrillator: Secondary | ICD-10-CM | POA: Diagnosis not present

## 2024-04-06 DIAGNOSIS — Z556 Problems related to health literacy: Secondary | ICD-10-CM | POA: Diagnosis not present

## 2024-04-06 DIAGNOSIS — J45909 Unspecified asthma, uncomplicated: Secondary | ICD-10-CM | POA: Diagnosis not present

## 2024-04-06 DIAGNOSIS — R296 Repeated falls: Secondary | ICD-10-CM | POA: Diagnosis not present

## 2024-04-06 DIAGNOSIS — E039 Hypothyroidism, unspecified: Secondary | ICD-10-CM | POA: Diagnosis not present

## 2024-04-06 DIAGNOSIS — I129 Hypertensive chronic kidney disease with stage 1 through stage 4 chronic kidney disease, or unspecified chronic kidney disease: Secondary | ICD-10-CM | POA: Diagnosis not present

## 2024-04-06 DIAGNOSIS — M545 Low back pain, unspecified: Secondary | ICD-10-CM | POA: Diagnosis not present

## 2024-04-06 DIAGNOSIS — E7800 Pure hypercholesterolemia, unspecified: Secondary | ICD-10-CM | POA: Diagnosis not present

## 2024-04-06 DIAGNOSIS — I480 Paroxysmal atrial fibrillation: Secondary | ICD-10-CM | POA: Diagnosis not present

## 2024-04-06 DIAGNOSIS — I429 Cardiomyopathy, unspecified: Secondary | ICD-10-CM | POA: Diagnosis not present

## 2024-04-12 DIAGNOSIS — N1832 Chronic kidney disease, stage 3b: Secondary | ICD-10-CM | POA: Diagnosis not present

## 2024-04-12 DIAGNOSIS — I429 Cardiomyopathy, unspecified: Secondary | ICD-10-CM | POA: Diagnosis not present

## 2024-04-12 DIAGNOSIS — Z95810 Presence of automatic (implantable) cardiac defibrillator: Secondary | ICD-10-CM | POA: Diagnosis not present

## 2024-04-12 DIAGNOSIS — E7800 Pure hypercholesterolemia, unspecified: Secondary | ICD-10-CM | POA: Diagnosis not present

## 2024-04-12 DIAGNOSIS — M545 Low back pain, unspecified: Secondary | ICD-10-CM | POA: Diagnosis not present

## 2024-04-12 DIAGNOSIS — I480 Paroxysmal atrial fibrillation: Secondary | ICD-10-CM | POA: Diagnosis not present

## 2024-04-12 DIAGNOSIS — Z556 Problems related to health literacy: Secondary | ICD-10-CM | POA: Diagnosis not present

## 2024-04-12 DIAGNOSIS — E039 Hypothyroidism, unspecified: Secondary | ICD-10-CM | POA: Diagnosis not present

## 2024-04-12 DIAGNOSIS — J45909 Unspecified asthma, uncomplicated: Secondary | ICD-10-CM | POA: Diagnosis not present

## 2024-04-12 DIAGNOSIS — I129 Hypertensive chronic kidney disease with stage 1 through stage 4 chronic kidney disease, or unspecified chronic kidney disease: Secondary | ICD-10-CM | POA: Diagnosis not present

## 2024-04-12 DIAGNOSIS — R296 Repeated falls: Secondary | ICD-10-CM | POA: Diagnosis not present

## 2024-04-13 ENCOUNTER — Ambulatory Visit: Payer: Self-pay | Admitting: Internal Medicine

## 2024-04-13 ENCOUNTER — Ambulatory Visit (INDEPENDENT_AMBULATORY_CARE_PROVIDER_SITE_OTHER): Payer: Self-pay

## 2024-04-13 DIAGNOSIS — I428 Other cardiomyopathies: Secondary | ICD-10-CM | POA: Diagnosis not present

## 2024-04-13 LAB — CUP PACEART REMOTE DEVICE CHECK
Battery Remaining Longevity: 30 mo
Battery Remaining Percentage: 34 %
Battery Voltage: 2.98 V
Brady Statistic AP VP Percent: 24 %
Brady Statistic AP VS Percent: 1 %
Brady Statistic AS VP Percent: 73 %
Brady Statistic AS VS Percent: 2.1 %
Brady Statistic RA Percent Paced: 22 %
Date Time Interrogation Session: 20250527115720
Implantable Lead Connection Status: 753985
Implantable Lead Connection Status: 753985
Implantable Lead Connection Status: 753985
Implantable Lead Implant Date: 20191213
Implantable Lead Implant Date: 20191213
Implantable Lead Implant Date: 20191213
Implantable Lead Location: 753858
Implantable Lead Location: 753859
Implantable Lead Location: 753860
Implantable Pulse Generator Implant Date: 20191213
Lead Channel Impedance Value: 1300 Ohm
Lead Channel Impedance Value: 310 Ohm
Lead Channel Impedance Value: 560 Ohm
Lead Channel Pacing Threshold Amplitude: 0.625 V
Lead Channel Pacing Threshold Amplitude: 0.75 V
Lead Channel Pacing Threshold Amplitude: 1.5 V
Lead Channel Pacing Threshold Pulse Width: 0.5 ms
Lead Channel Pacing Threshold Pulse Width: 0.5 ms
Lead Channel Pacing Threshold Pulse Width: 0.5 ms
Lead Channel Sensing Intrinsic Amplitude: 1.2 mV
Lead Channel Sensing Intrinsic Amplitude: 12 mV
Lead Channel Setting Pacing Amplitude: 1.625
Lead Channel Setting Pacing Amplitude: 2 V
Lead Channel Setting Pacing Amplitude: 2.5 V
Lead Channel Setting Pacing Pulse Width: 0.5 ms
Lead Channel Setting Pacing Pulse Width: 0.5 ms
Lead Channel Setting Sensing Sensitivity: 2 mV
Pulse Gen Model: 3562
Pulse Gen Serial Number: 9064261

## 2024-04-14 DIAGNOSIS — J45909 Unspecified asthma, uncomplicated: Secondary | ICD-10-CM | POA: Diagnosis not present

## 2024-04-14 DIAGNOSIS — I429 Cardiomyopathy, unspecified: Secondary | ICD-10-CM | POA: Diagnosis not present

## 2024-04-14 DIAGNOSIS — I480 Paroxysmal atrial fibrillation: Secondary | ICD-10-CM | POA: Diagnosis not present

## 2024-04-14 DIAGNOSIS — Z95810 Presence of automatic (implantable) cardiac defibrillator: Secondary | ICD-10-CM | POA: Diagnosis not present

## 2024-04-14 DIAGNOSIS — E039 Hypothyroidism, unspecified: Secondary | ICD-10-CM | POA: Diagnosis not present

## 2024-04-14 DIAGNOSIS — I129 Hypertensive chronic kidney disease with stage 1 through stage 4 chronic kidney disease, or unspecified chronic kidney disease: Secondary | ICD-10-CM | POA: Diagnosis not present

## 2024-04-14 DIAGNOSIS — M545 Low back pain, unspecified: Secondary | ICD-10-CM | POA: Diagnosis not present

## 2024-04-14 DIAGNOSIS — N1832 Chronic kidney disease, stage 3b: Secondary | ICD-10-CM | POA: Diagnosis not present

## 2024-04-14 DIAGNOSIS — E7800 Pure hypercholesterolemia, unspecified: Secondary | ICD-10-CM | POA: Diagnosis not present

## 2024-04-14 DIAGNOSIS — R296 Repeated falls: Secondary | ICD-10-CM | POA: Diagnosis not present

## 2024-04-14 DIAGNOSIS — Z556 Problems related to health literacy: Secondary | ICD-10-CM | POA: Diagnosis not present

## 2024-04-16 DIAGNOSIS — Z95810 Presence of automatic (implantable) cardiac defibrillator: Secondary | ICD-10-CM | POA: Diagnosis not present

## 2024-04-16 DIAGNOSIS — E039 Hypothyroidism, unspecified: Secondary | ICD-10-CM | POA: Diagnosis not present

## 2024-04-16 DIAGNOSIS — I429 Cardiomyopathy, unspecified: Secondary | ICD-10-CM | POA: Diagnosis not present

## 2024-04-16 DIAGNOSIS — Z556 Problems related to health literacy: Secondary | ICD-10-CM | POA: Diagnosis not present

## 2024-04-16 DIAGNOSIS — N1832 Chronic kidney disease, stage 3b: Secondary | ICD-10-CM | POA: Diagnosis not present

## 2024-04-16 DIAGNOSIS — I129 Hypertensive chronic kidney disease with stage 1 through stage 4 chronic kidney disease, or unspecified chronic kidney disease: Secondary | ICD-10-CM | POA: Diagnosis not present

## 2024-04-16 DIAGNOSIS — I480 Paroxysmal atrial fibrillation: Secondary | ICD-10-CM | POA: Diagnosis not present

## 2024-04-16 DIAGNOSIS — R296 Repeated falls: Secondary | ICD-10-CM | POA: Diagnosis not present

## 2024-04-16 DIAGNOSIS — J45909 Unspecified asthma, uncomplicated: Secondary | ICD-10-CM | POA: Diagnosis not present

## 2024-04-16 DIAGNOSIS — E7800 Pure hypercholesterolemia, unspecified: Secondary | ICD-10-CM | POA: Diagnosis not present

## 2024-04-16 DIAGNOSIS — M545 Low back pain, unspecified: Secondary | ICD-10-CM | POA: Diagnosis not present

## 2024-04-18 DIAGNOSIS — N1832 Chronic kidney disease, stage 3b: Secondary | ICD-10-CM | POA: Diagnosis not present

## 2024-04-18 DIAGNOSIS — I729 Aneurysm of unspecified site: Secondary | ICD-10-CM | POA: Diagnosis not present

## 2024-04-18 DIAGNOSIS — E039 Hypothyroidism, unspecified: Secondary | ICD-10-CM | POA: Diagnosis not present

## 2024-04-19 DIAGNOSIS — I480 Paroxysmal atrial fibrillation: Secondary | ICD-10-CM | POA: Diagnosis not present

## 2024-04-19 DIAGNOSIS — I129 Hypertensive chronic kidney disease with stage 1 through stage 4 chronic kidney disease, or unspecified chronic kidney disease: Secondary | ICD-10-CM | POA: Diagnosis not present

## 2024-04-19 DIAGNOSIS — J45909 Unspecified asthma, uncomplicated: Secondary | ICD-10-CM | POA: Diagnosis not present

## 2024-04-19 DIAGNOSIS — Z556 Problems related to health literacy: Secondary | ICD-10-CM | POA: Diagnosis not present

## 2024-04-19 DIAGNOSIS — M545 Low back pain, unspecified: Secondary | ICD-10-CM | POA: Diagnosis not present

## 2024-04-19 DIAGNOSIS — I429 Cardiomyopathy, unspecified: Secondary | ICD-10-CM | POA: Diagnosis not present

## 2024-04-19 DIAGNOSIS — Z95810 Presence of automatic (implantable) cardiac defibrillator: Secondary | ICD-10-CM | POA: Diagnosis not present

## 2024-04-19 DIAGNOSIS — N1832 Chronic kidney disease, stage 3b: Secondary | ICD-10-CM | POA: Diagnosis not present

## 2024-04-19 DIAGNOSIS — E039 Hypothyroidism, unspecified: Secondary | ICD-10-CM | POA: Diagnosis not present

## 2024-04-19 DIAGNOSIS — E7800 Pure hypercholesterolemia, unspecified: Secondary | ICD-10-CM | POA: Diagnosis not present

## 2024-04-19 DIAGNOSIS — R296 Repeated falls: Secondary | ICD-10-CM | POA: Diagnosis not present

## 2024-04-20 ENCOUNTER — Other Ambulatory Visit: Payer: Self-pay | Admitting: Licensed Clinical Social Worker

## 2024-04-20 NOTE — Patient Outreach (Signed)
 Complex Care Management   Visit Note  04/20/2024  Name:  Erin Novak MRN: [id number] DOB: January 12, 1941  Situation: Referral received for Complex Care Management related to Dementia I obtained verbal consent from Caregiver.  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 No VHR episodes. Health trends (patient activity & average heart rates) are stable. CorVue impedance monitoring does not suggest recent fluid accumulation. longevity is 6.4 - 7.0 years. RA pacing is 83 %, RV pacing is >99 %, and LV pacing is >99 %.   Personal history of radiation therapy 2018   PONV (postoperative nausea and vomiting)    Seasonal allergies    Sinus node dysfunction (HCC) 07/12/2019   Thyroid  disease    hypothyroid    Assessment: Patient Reported Symptoms:  Cognitive Cognitive Status: Normal speech and language skills Cognitive/Intellectual Conditions Management [RPT]: None reported or documented in medical history or problem list      Neurological Neurological Review of Symptoms: No symptoms reported Neurological Conditions: Dementia Neurological Management Strategies: Coping strategies, Adequate rest, Medication therapy  HEENT HEENT Symptoms Reported: No symptoms reported      Cardiovascular Cardiovascular  Symptoms Reported: No symptoms reported Cardiovascular Conditions: Hypertension, Heart failure Cardiovascular Management Strategies: Routine screening, Medication therapy  Respiratory Respiratory Symptoms Reported: No symptoms reported    Endocrine Patient reports the following symptoms related to hypoglycemia or hyperglycemia : No symptoms reported Is patient diabetic?: No    Gastrointestinal Gastrointestinal Symptoms Reported: No symptoms reported      Genitourinary Genitourinary Symptoms Reported: No symptoms reported Genitourinary Conditions: Chronic kidney disease  Integumentary Integumentary Symptoms Reported: No symptoms reported    Musculoskeletal Musculoskelatal Symptoms Reviewed: Muscle pain Additional Musculoskeletal Details: Ongoing leg and back pain Musculoskeletal Conditions: Back pain Musculoskeletal Management Strategies: Coping strategies, Exercise, Routine screening      Psychosocial Psychosocial Symptoms Reported: No symptoms reported Additional Psychological Details: Patient continues to receive strong support from family and participates in OT/PT. Palliative Care is scheduled to visit home on 04/29/24 to discuss supportive services. LCSW discussed strategies to assist with caregiver stress Behavioral Health Conditions: Dementia Behavioral Management Strategies: Adequate rest, Coping strategies, Support system, Medication therapy Major Change/Loss/Stressor/Fears (CP): Medical condition, self Quality of Family Relationships: helpful, involved, supportive Do you feel physically threatened by others?: No      07/08/2017    9:10 AM  Depression screen PHQ 2/9  Decreased Interest 0  Down, Depressed, Hopeless 0  PHQ - 2 Score 0    There were no vitals filed for this visit.  Medications Reviewed Today     Reviewed by Adriana Albany, LCSW (Social Worker) on 04/20/24 at 1241  Med List Status: <None>  Medication Order Taking? Sig Documenting Provider Last Dose  Status Informant  acetaminophen  (TYLENOL ) 500 MG tablet [id number] No Take 1,000 mg by mouth every 6 (six) hours as needed for mild pain (pain score 1-3). [provider] Taking Active Family Member  anastrozole  (ARIMIDEX ) 1 MG tablet [id number] No TAKE 1 TABLET BY MOUTH DAILY Patwardhan, Manish J, MD Taking Active Family Member  atorvastatin  (LIPITOR) 10 MG tablet [id number] No TAKE 1 TABLET BY MOUTH DAILY Patwardhan, Manish J, MD Taking Active Family Member  levothyroxine  (SYNTHROID ) 88 MCG tablet [id number] No TAKE 1 TABLET BY MOUTH IN THE  MORNING Patwardhan, Manish J, MD Taking Active Family Member  memantine  (NAMENDA ) 10 MG tablet [id number] No Take 10 mg by mouth 2 (two) times daily. [provider] Taking Active Family Member  metoprolol  succinate (TOPROL -XL) 100 MG 24 hr tablet [id number] No TAKE 1 TABLET BY MOUTH ONCE  DAILY WITH MEAL OR IMMEDIATELY  FOLLOWING Patwardhan, Kaye Parsons, MD Taking Active Family Member  Multiple Vitamin (MULTIVITAMIN WITH MINERALS) TABS tablet [id number] No Take 1 tablet by mouth daily.  Patient not taking: Reported on 03/21/2024   Edwena Graham, MD Not Taking Active   Med List Note (Ward, Angelica, CPhT 03/10/24 1849): Anything long term medication-wise should be sent to [facility]            Recommendation:   Continue Current Plan of Care  Follow Up Plan:   Telephone follow-up 3 weeks  Alease Hunter, LCSW [place]  [facility], [facility] Clinical Social Worker Direct Dial: [phone]  Fax: [phone] Website: [email or web address] 1:07 PM

## 2024-04-20 NOTE — Patient Instructions (Signed)
 Visit Information  Thank you for taking time to visit with me today. Please don't hesitate to contact me if I can be of assistance to you before our next scheduled appointment.  Your next care management appointment is by telephone on 06/25 at 1 PM  Please call the care guide team at [phone] if you need to cancel, schedule, or reschedule an appointment.   Please call the Suicide and Crisis Lifeline: 988 call the [place]  National Suicide Prevention Lifeline: 3346177058 or TTY: 224-258-3354 TTY 581-101-5573) to talk to a trained counselor call 911 if you are experiencing a Mental Health or Behavioral Health Crisis or need someone to talk to.  Alease Hunter, LCSW [place]  [facility], [facility] Clinical Social Worker Direct Dial: [phone]  Fax: [phone] Website: [email or web address] 1:08 PM

## 2024-04-21 ENCOUNTER — Ambulatory Visit: Admitting: Pharmacist

## 2024-04-21 DIAGNOSIS — E039 Hypothyroidism, unspecified: Secondary | ICD-10-CM | POA: Diagnosis not present

## 2024-04-21 DIAGNOSIS — I429 Cardiomyopathy, unspecified: Secondary | ICD-10-CM | POA: Diagnosis not present

## 2024-04-21 DIAGNOSIS — E7800 Pure hypercholesterolemia, unspecified: Secondary | ICD-10-CM | POA: Diagnosis not present

## 2024-04-21 DIAGNOSIS — I480 Paroxysmal atrial fibrillation: Secondary | ICD-10-CM | POA: Diagnosis not present

## 2024-04-21 DIAGNOSIS — Z556 Problems related to health literacy: Secondary | ICD-10-CM | POA: Diagnosis not present

## 2024-04-21 DIAGNOSIS — R296 Repeated falls: Secondary | ICD-10-CM | POA: Diagnosis not present

## 2024-04-21 DIAGNOSIS — M545 Low back pain, unspecified: Secondary | ICD-10-CM | POA: Diagnosis not present

## 2024-04-21 DIAGNOSIS — J45909 Unspecified asthma, uncomplicated: Secondary | ICD-10-CM | POA: Diagnosis not present

## 2024-04-21 DIAGNOSIS — I129 Hypertensive chronic kidney disease with stage 1 through stage 4 chronic kidney disease, or unspecified chronic kidney disease: Secondary | ICD-10-CM | POA: Diagnosis not present

## 2024-04-21 DIAGNOSIS — Z95810 Presence of automatic (implantable) cardiac defibrillator: Secondary | ICD-10-CM | POA: Diagnosis not present

## 2024-04-21 DIAGNOSIS — N1832 Chronic kidney disease, stage 3b: Secondary | ICD-10-CM | POA: Diagnosis not present

## 2024-04-26 DIAGNOSIS — Z95810 Presence of automatic (implantable) cardiac defibrillator: Secondary | ICD-10-CM | POA: Diagnosis not present

## 2024-04-26 DIAGNOSIS — I429 Cardiomyopathy, unspecified: Secondary | ICD-10-CM | POA: Diagnosis not present

## 2024-04-26 DIAGNOSIS — I480 Paroxysmal atrial fibrillation: Secondary | ICD-10-CM | POA: Diagnosis not present

## 2024-04-26 DIAGNOSIS — J45909 Unspecified asthma, uncomplicated: Secondary | ICD-10-CM | POA: Diagnosis not present

## 2024-04-26 DIAGNOSIS — Z556 Problems related to health literacy: Secondary | ICD-10-CM | POA: Diagnosis not present

## 2024-04-26 DIAGNOSIS — N1832 Chronic kidney disease, stage 3b: Secondary | ICD-10-CM | POA: Diagnosis not present

## 2024-04-26 DIAGNOSIS — E7800 Pure hypercholesterolemia, unspecified: Secondary | ICD-10-CM | POA: Diagnosis not present

## 2024-04-26 DIAGNOSIS — R296 Repeated falls: Secondary | ICD-10-CM | POA: Diagnosis not present

## 2024-04-26 DIAGNOSIS — M545 Low back pain, unspecified: Secondary | ICD-10-CM | POA: Diagnosis not present

## 2024-04-26 DIAGNOSIS — I129 Hypertensive chronic kidney disease with stage 1 through stage 4 chronic kidney disease, or unspecified chronic kidney disease: Secondary | ICD-10-CM | POA: Diagnosis not present

## 2024-04-26 DIAGNOSIS — E039 Hypothyroidism, unspecified: Secondary | ICD-10-CM | POA: Diagnosis not present

## 2024-05-03 DIAGNOSIS — I429 Cardiomyopathy, unspecified: Secondary | ICD-10-CM | POA: Diagnosis not present

## 2024-05-03 DIAGNOSIS — J45909 Unspecified asthma, uncomplicated: Secondary | ICD-10-CM | POA: Diagnosis not present

## 2024-05-03 DIAGNOSIS — N1832 Chronic kidney disease, stage 3b: Secondary | ICD-10-CM | POA: Diagnosis not present

## 2024-05-03 DIAGNOSIS — I129 Hypertensive chronic kidney disease with stage 1 through stage 4 chronic kidney disease, or unspecified chronic kidney disease: Secondary | ICD-10-CM | POA: Diagnosis not present

## 2024-05-03 DIAGNOSIS — M545 Low back pain, unspecified: Secondary | ICD-10-CM | POA: Diagnosis not present

## 2024-05-03 DIAGNOSIS — E039 Hypothyroidism, unspecified: Secondary | ICD-10-CM | POA: Diagnosis not present

## 2024-05-03 DIAGNOSIS — Z556 Problems related to health literacy: Secondary | ICD-10-CM | POA: Diagnosis not present

## 2024-05-03 DIAGNOSIS — I480 Paroxysmal atrial fibrillation: Secondary | ICD-10-CM | POA: Diagnosis not present

## 2024-05-03 DIAGNOSIS — E7800 Pure hypercholesterolemia, unspecified: Secondary | ICD-10-CM | POA: Diagnosis not present

## 2024-05-03 DIAGNOSIS — R296 Repeated falls: Secondary | ICD-10-CM | POA: Diagnosis not present

## 2024-05-03 DIAGNOSIS — Z95810 Presence of automatic (implantable) cardiac defibrillator: Secondary | ICD-10-CM | POA: Diagnosis not present

## 2024-05-04 DIAGNOSIS — Z556 Problems related to health literacy: Secondary | ICD-10-CM | POA: Diagnosis not present

## 2024-05-04 DIAGNOSIS — I429 Cardiomyopathy, unspecified: Secondary | ICD-10-CM | POA: Diagnosis not present

## 2024-05-04 DIAGNOSIS — M545 Low back pain, unspecified: Secondary | ICD-10-CM | POA: Diagnosis not present

## 2024-05-04 DIAGNOSIS — Z95810 Presence of automatic (implantable) cardiac defibrillator: Secondary | ICD-10-CM | POA: Diagnosis not present

## 2024-05-04 DIAGNOSIS — I480 Paroxysmal atrial fibrillation: Secondary | ICD-10-CM | POA: Diagnosis not present

## 2024-05-04 DIAGNOSIS — J45909 Unspecified asthma, uncomplicated: Secondary | ICD-10-CM | POA: Diagnosis not present

## 2024-05-04 DIAGNOSIS — E7800 Pure hypercholesterolemia, unspecified: Secondary | ICD-10-CM | POA: Diagnosis not present

## 2024-05-04 DIAGNOSIS — N1832 Chronic kidney disease, stage 3b: Secondary | ICD-10-CM | POA: Diagnosis not present

## 2024-05-04 DIAGNOSIS — R296 Repeated falls: Secondary | ICD-10-CM | POA: Diagnosis not present

## 2024-05-04 DIAGNOSIS — E039 Hypothyroidism, unspecified: Secondary | ICD-10-CM | POA: Diagnosis not present

## 2024-05-04 DIAGNOSIS — I129 Hypertensive chronic kidney disease with stage 1 through stage 4 chronic kidney disease, or unspecified chronic kidney disease: Secondary | ICD-10-CM | POA: Diagnosis not present

## 2024-05-08 ENCOUNTER — Emergency Department (HOSPITAL_BASED_OUTPATIENT_CLINIC_OR_DEPARTMENT_OTHER)

## 2024-05-08 ENCOUNTER — Encounter (HOSPITAL_BASED_OUTPATIENT_CLINIC_OR_DEPARTMENT_OTHER): Payer: Self-pay

## 2024-05-08 ENCOUNTER — Emergency Department (HOSPITAL_BASED_OUTPATIENT_CLINIC_OR_DEPARTMENT_OTHER): Admitting: Radiology

## 2024-05-08 ENCOUNTER — Other Ambulatory Visit: Payer: Self-pay

## 2024-05-08 ENCOUNTER — Emergency Department (HOSPITAL_BASED_OUTPATIENT_CLINIC_OR_DEPARTMENT_OTHER): Admission: EM | Admit: 2024-05-08 | Discharge: 2024-05-08 | Disposition: A | Discharge status: Home / Self Care

## 2024-05-08 DIAGNOSIS — S42202A Unspecified fracture of upper end of left humerus, initial encounter for closed fracture: Secondary | ICD-10-CM | POA: Diagnosis not present

## 2024-05-08 DIAGNOSIS — X58XXXA Exposure to other specified factors, initial encounter: Secondary | ICD-10-CM | POA: Diagnosis not present

## 2024-05-08 DIAGNOSIS — S42292A Other displaced fracture of upper end of left humerus, initial encounter for closed fracture: Secondary | ICD-10-CM | POA: Diagnosis not present

## 2024-05-08 DIAGNOSIS — S42295A Other nondisplaced fracture of upper end of left humerus, initial encounter for closed fracture: Secondary | ICD-10-CM | POA: Diagnosis not present

## 2024-05-08 DIAGNOSIS — Z950 Presence of cardiac pacemaker: Secondary | ICD-10-CM | POA: Diagnosis not present

## 2024-05-08 DIAGNOSIS — F0390 Unspecified dementia without behavioral disturbance: Secondary | ICD-10-CM | POA: Insufficient documentation

## 2024-05-08 DIAGNOSIS — S0990XA Unspecified injury of head, initial encounter: Secondary | ICD-10-CM | POA: Diagnosis not present

## 2024-05-08 DIAGNOSIS — I10 Essential (primary) hypertension: Secondary | ICD-10-CM | POA: Diagnosis not present

## 2024-05-08 DIAGNOSIS — M25512 Pain in left shoulder: Secondary | ICD-10-CM | POA: Diagnosis present

## 2024-05-08 DIAGNOSIS — S42232A 3-part fracture of surgical neck of left humerus, initial encounter for closed fracture: Secondary | ICD-10-CM | POA: Diagnosis not present

## 2024-05-08 LAB — BASIC METABOLIC PANEL WITH GFR
Anion gap: 17 — ABNORMAL HIGH (ref 5–15)
BUN: 22 mg/dL (ref 8–23)
CO2: 20 mmol/L — ABNORMAL LOW (ref 22–32)
Calcium: 10.1 mg/dL (ref 8.9–10.3)
Chloride: 99 mmol/L (ref 98–111)
Creatinine, Ser: 0.91 mg/dL (ref 0.44–1.00)
GFR, Estimated: >60 mL/min (ref >60)
Glucose, Bld: 137 mg/dL — ABNORMAL HIGH (ref 70–99)
Potassium: 4.3 mmol/L (ref 3.5–5.1)
Sodium: 136 mmol/L (ref 135–145)

## 2024-05-08 LAB — CBC
HCT: 42.9 % (ref 36.0–46.0)
Hemoglobin: 13.9 g/dL (ref 12.0–15.0)
MCH: 30.6 pg (ref 26.0–34.0)
MCHC: 32.4 g/dL (ref 30.0–36.0)
MCV: 94.5 fL (ref 80.0–100.0)
Platelets: 188 10*3/uL (ref 150–400)
RBC: 4.54 MIL/uL (ref 3.87–5.11)
RDW: 15.6 % — ABNORMAL HIGH (ref 11.5–15.5)
WBC: 11.9 10*3/uL — ABNORMAL HIGH (ref 4.0–10.5)
nRBC: 0 % (ref 0.0–0.2)

## 2024-05-08 LAB — CK: Total CK: 30 U/L — ABNORMAL LOW (ref 38–234)

## 2024-05-08 MED ORDER — HYDROCODONE-ACETAMINOPHEN 5-325 MG PO TABS: 1.0000 | ORAL_TABLET | ORAL | 0 refills | Status: DC | PRN

## 2024-05-08 MED ORDER — HYDROCODONE-ACETAMINOPHEN 5-325 MG PO TABS
1.0000 | ORAL_TABLET | Freq: Once | ORAL | Status: AC
  Administered 2024-05-08: 1 via ORAL
  Filled 2024-05-08: qty 1

## 2024-05-08 MED ORDER — HYDROCODONE-ACETAMINOPHEN 5-325 MG PO TABS
2.0000 | ORAL_TABLET | Freq: Once | ORAL | Status: AC
  Administered 2024-05-08: 2 via ORAL
  Filled 2024-05-08: qty 2

## 2024-05-08 NOTE — ED Triage Notes (Addendum)
 Pt w daughters, presumed to have tripped down steps- found down approx 2hrs later. Pt c/o terrible L shoulder pain since. Swelling to same, no ROM. CNS intact distal to injury  Took home hydrocodone  PTA

## 2024-05-08 NOTE — ED Notes (Signed)
 Patient transported to XR.

## 2024-05-08 NOTE — Discharge Instructions (Signed)
 Please call the orthopedic surgeon tomorrow to schedule your appointment on Wednesday.  Remain in the sling until you are seen by them.  Take Tylenol  alternating with hydrocodone  for pain control.

## 2024-05-08 NOTE — ED Notes (Signed)
 Fall risk bundle is currently in place.

## 2024-05-08 NOTE — ED Notes (Signed)
 Patient transported to CT

## 2024-05-08 NOTE — ED Notes (Signed)
 Answered call light, pt advised she is in pain. RN Signe and Cristopher are aware. Advised pt I will allow about 15-20 mins pain is administered before I apply the sling to her shoulder.

## 2024-05-09 DIAGNOSIS — J45909 Unspecified asthma, uncomplicated: Secondary | ICD-10-CM | POA: Diagnosis not present

## 2024-05-09 DIAGNOSIS — I129 Hypertensive chronic kidney disease with stage 1 through stage 4 chronic kidney disease, or unspecified chronic kidney disease: Secondary | ICD-10-CM | POA: Diagnosis not present

## 2024-05-09 DIAGNOSIS — M545 Low back pain, unspecified: Secondary | ICD-10-CM | POA: Diagnosis not present

## 2024-05-09 DIAGNOSIS — R296 Repeated falls: Secondary | ICD-10-CM | POA: Diagnosis not present

## 2024-05-09 DIAGNOSIS — E7800 Pure hypercholesterolemia, unspecified: Secondary | ICD-10-CM | POA: Diagnosis not present

## 2024-05-09 DIAGNOSIS — Z556 Problems related to health literacy: Secondary | ICD-10-CM | POA: Diagnosis not present

## 2024-05-09 DIAGNOSIS — E039 Hypothyroidism, unspecified: Secondary | ICD-10-CM | POA: Diagnosis not present

## 2024-05-09 DIAGNOSIS — I429 Cardiomyopathy, unspecified: Secondary | ICD-10-CM | POA: Diagnosis not present

## 2024-05-09 DIAGNOSIS — Z95810 Presence of automatic (implantable) cardiac defibrillator: Secondary | ICD-10-CM | POA: Diagnosis not present

## 2024-05-09 DIAGNOSIS — N1832 Chronic kidney disease, stage 3b: Secondary | ICD-10-CM | POA: Diagnosis not present

## 2024-05-09 DIAGNOSIS — I480 Paroxysmal atrial fibrillation: Secondary | ICD-10-CM | POA: Diagnosis not present

## 2024-05-09 NOTE — ED Provider Notes (Incomplete)
 [place] EMERGENCY DEPARTMENT AT [facility] Provider Note   CSN: [id number] Arrival date & time: 05/08/24  1646     Patient presents with: No chief complaint on file.   Erin Novak is a 83 y.o. female.  {Add pertinent medical, surgical, social history, OB history to HPI:32925} Is an 83 year old female presenting emergency department with left shoulder/arm pain.  Found on the ground by family members.  History of dementia cannot provide meaningful history.  Family member notes that patient longer she could have been on the ground was 2 hours.   no chest pain no shortness of breath.  No other pain.        Prior to Admission medications   Medication Sig Start Date End Date Taking? Authorizing Provider  HYDROcodone -acetaminophen  (NORCO/VICODIN) 5-325 MG tablet Take 1-2 tablets by mouth every 4 (four) hours as needed. 05/08/24  Yes Neysa Caron PARAS, DO  acetaminophen  (TYLENOL ) 500 MG tablet Take 1,000 mg by mouth every 6 (six) hours as needed for mild pain (pain score 1-3).    [provider]  anastrozole  (ARIMIDEX ) 1 MG tablet TAKE 1 TABLET BY MOUTH DAILY 04/15/23   Patwardhan, Manish J, MD  atorvastatin  (LIPITOR) 10 MG tablet TAKE 1 TABLET BY MOUTH DAILY 03/07/24   Patwardhan, Manish J, MD  levothyroxine  (SYNTHROID ) 88 MCG tablet TAKE 1 TABLET BY MOUTH IN THE  MORNING 05/22/23   Patwardhan, Manish J, MD  memantine  (NAMENDA ) 10 MG tablet Take 10 mg by mouth 2 (two) times daily. 03/09/24   [provider]  metoprolol  succinate (TOPROL -XL) 100 MG 24 hr tablet TAKE 1 TABLET BY MOUTH ONCE  DAILY WITH MEAL OR IMMEDIATELY  FOLLOWING 03/07/24   Patwardhan, Newman PARAS, MD  Multiple Vitamin (MULTIVITAMIN WITH MINERALS) TABS tablet Take 1 tablet by mouth daily. Patient not taking: Reported on 03/21/2024 03/16/24   Pudota, Kingsley P, MD    Allergies: Percodan [oxycodone -aspirin ], Prednisone, Amoxicillin-pot clavulanate, and Lisinopril    Review of Systems  Updated Vital  Signs BP 132/87 (BP Location: Left Arm)   Pulse 68   Temp 98 F (36.7 C) (Oral)   Resp 18   SpO2 96%   Physical Exam Vitals and nursing note reviewed.  Constitutional:      General: She is not in acute distress.    Appearance: She is not toxic-appearing.  HENT:     Head: Normocephalic.     Nose: Nose normal.     Mouth/Throat:     Mouth: Mucous membranes are moist.   Eyes:     Conjunctiva/sclera: Conjunctivae normal.    Cardiovascular:     Rate and Rhythm: Normal rate and regular rhythm.  Pulmonary:     Effort: Pulmonary effort is normal.     Breath sounds: Normal breath sounds.  Abdominal:     General: Abdomen is flat. There is no distension.     Tenderness: There is no abdominal tenderness. There is no guarding or rebound.   Musculoskeletal:     Comments: Chest all stable nontender.  Pelvis stable nontender.  Some tenderness and deformity to the left upper extremity.  Otherwise no bony tenderness.   Skin:    Capillary Refill: Capillary refill takes less than 2 seconds.   Neurological:     Mental Status: She is alert. Mental status is at baseline.   Psychiatric:        Mood and Affect: Mood normal.        Behavior: Behavior normal.     (  all labs ordered are listed, but only abnormal results are displayed) Labs Reviewed  CBC - Abnormal; Notable for the following components:      Result Value   WBC 11.9 (*)    RDW 15.6 (*)    All other components within normal limits  BASIC METABOLIC PANEL WITH GFR - Abnormal; Notable for the following components:   CO2 20 (*)    Glucose, Bld 137 (*)    Anion gap 17 (*)    All other components within normal limits  CK - Abnormal; Notable for the following components:   Total CK 30 (*)    All other components within normal limits    EKG: None  Radiology: CT Shoulder Left Wo Contrast Result Date: 05/08/2024 CLINICAL DATA:  Trauma EXAM: CT OF THE UPPER LEFT EXTREMITY WITHOUT CONTRAST TECHNIQUE: Multidetector CT imaging  of the upper left extremity was performed according to the standard protocol. RADIATION DOSE REDUCTION: This exam was performed according to the departmental dose-optimization program which includes automated exposure control, adjustment of the mA and/or kV according to patient size and/or use of iterative reconstruction technique. COMPARISON:  Shoulder x-ray same day FINDINGS: Bones/Joint/Cartilage There is a transverse impacted left humeral neck fracture. The distal fracture fragment is displaced anteriorly 1 cm and impacted 18 mm. Nondisplaced comminuted fracture involving the lateral and anterior aspect of the humeral head. Ligaments Suboptimally assessed by CT. Muscles and Tendons There is intramuscular edema surrounding the fracture. Soft tissues Left pacemaker present. IMPRESSION: 1. Impacted and displaced left humeral neck fracture. 2. Nondisplaced comminuted fracture involving the lateral and anterior aspect of the humeral head. Electronically Signed   By: Greig Pique M.D.   On: 05/08/2024 18:55   DG Humerus Left Result Date: 05/08/2024 CLINICAL DATA:  fall EXAM: LEFT HUMERUS - 2+ VIEW COMPARISON:  X-ray left shoulder 05/08/2024 FINDINGS: Redemonstration of an acute displaced and comminuted fracture of the proximal humerus involving the head and likely anatomical neck. No acute fracture of the humerus more distally. Elbow is grossly unremarkable. Soft tissues are unremarkable. IMPRESSION: 1. Redemonstration of an acute displaced and comminuted fracture of the proximal humerus involving the head and likely anatomical neck. 2. No acute fracture of the humerus more distally. Electronically Signed   By: Morgane  Naveau M.D.   On: 05/08/2024 18:07   DG Shoulder Left Result Date: 05/08/2024 CLINICAL DATA:  Fall EXAM: LEFT SHOULDER - 2+ VIEW COMPARISON:  None Available. FINDINGS: Impaction fracture of the LEFT humeral head. Posterior tuberosity is avulsed. No dislocation. IMPRESSION: Impaction fracture of  the LEFT humeral head. Electronically Signed   By: Jackquline Boxer M.D.   On: 05/08/2024 17:50   CT Head Wo Contrast Result Date: 05/08/2024 CLINICAL DATA:  Head trauma, minor (Age >= 65y) EXAM: CT HEAD WITHOUT CONTRAST TECHNIQUE: Contiguous axial images were obtained from the base of the skull through the vertex without intravenous contrast. RADIATION DOSE REDUCTION: This exam was performed according to the departmental dose-optimization program which includes automated exposure control, adjustment of the mA and/or kV according to patient size and/or use of iterative reconstruction technique. COMPARISON:  None Available. FINDINGS: Brain: There is atrophy and chronic small vessel disease changes. No acute intracranial abnormality. Specifically, no hemorrhage, hydrocephalus, mass lesion, acute infarction, or significant intracranial injury. Vascular: No hyperdense vessel or unexpected calcification. Skull: No acute calvarial abnormality. Sinuses/Orbits: No acute findings. Mucosal thickening throughout the paranasal sinuses. Mastoid air cells clear. Other: None IMPRESSION: Atrophy, chronic microvascular disease. No acute intracranial abnormality. Chronic  sinusitis. Electronically Signed   By: Franky Crease M.D.   On: 05/08/2024 17:43    {Document cardiac monitor, telemetry assessment procedure when appropriate:32947} Procedures   Medications Ordered in the ED  HYDROcodone -acetaminophen  (NORCO/VICODIN) 5-325 MG per tablet 1 tablet (1 tablet Oral Given 05/08/24 1837)  HYDROcodone -acetaminophen  (NORCO/VICODIN) 5-325 MG per tablet 2 tablet (2 tablets Oral Given 05/08/24 1942)    Clinical Course as of 05/09/24 0000  Sun May 08, 2024  1831 Spoke with Dr. Celena; reviewing images. Tentative plan to sling and f/u on wed.   [TY]    Clinical Course User Index [TY] Neysa Caron PARAS, DO   {Click here for ABCD2, HEART and other calculators REFRESH Note before signing:1}                              Medical  Decision Making This is an 83 year old female with dementia presented to the emergency department after presumed fall found on the ground.  Deformity to her left humerus.  She is afebrile vital signs reassuring.  Given clear nature of fall basic labs obtained primarily to exclude rhabdomyolysis.  She has no elevated CK.  Normal kidney function.  No significant metabolic derangements.  Minor elevation in her  Amount and/or Complexity of Data Reviewed Labs: ordered. Radiology: ordered. ECG/medicine tests: ordered.  Risk Prescription drug management.   ***  {Document critical care time when appropriate  Document review of labs and clinical decision tools ie CHADS2VASC2, etc  Document your independent review of radiology images and any outside records  Document your discussion with family members, caretakers and with consultants  Document social determinants of health affecting pt's care  Document your decision making why or why not admission, treatments were needed:32947:::1}   Final diagnoses:  Closed fracture of proximal end of left humerus, unspecified fracture morphology, initial encounter    ED Discharge Orders          Ordered    HYDROcodone -acetaminophen  (NORCO/VICODIN) 5-325 MG tablet  Every 4 hours PRN        05/08/24 1931

## 2024-05-09 NOTE — ED Provider Notes (Signed)
 [place] EMERGENCY DEPARTMENT AT [facility] Provider Note   CSN: [id number] Arrival date & time: 05/08/24  1646     Patient presents with: No chief complaint on file.   Erin Novak is a 83 y.o. female.   Is an 83 year old female presenting emergency department with left shoulder/arm pain.  Found on the ground by family members.  History of dementia cannot provide meaningful history.  Family member notes that patient longer she could have been on the ground was 2 hours.   no chest pain no shortness of breath.  No other pain.        Prior to Admission medications   Medication Sig Start Date End Date Taking? Authorizing Provider  HYDROcodone -acetaminophen  (NORCO/VICODIN) 5-325 MG tablet Take 1-2 tablets by mouth every 4 (four) hours as needed. 05/08/24  Yes Neysa Caron PARAS, DO  acetaminophen  (TYLENOL ) 500 MG tablet Take 1,000 mg by mouth every 6 (six) hours as needed for mild pain (pain score 1-3).    [provider]  anastrozole  (ARIMIDEX ) 1 MG tablet TAKE 1 TABLET BY MOUTH DAILY 04/15/23   Patwardhan, Manish J, MD  atorvastatin  (LIPITOR) 10 MG tablet TAKE 1 TABLET BY MOUTH DAILY 03/07/24   Patwardhan, Manish J, MD  levothyroxine  (SYNTHROID ) 88 MCG tablet TAKE 1 TABLET BY MOUTH IN THE  MORNING 05/22/23   Patwardhan, Manish J, MD  memantine  (NAMENDA ) 10 MG tablet Take 10 mg by mouth 2 (two) times daily. 03/09/24   [provider]  metoprolol  succinate (TOPROL -XL) 100 MG 24 hr tablet TAKE 1 TABLET BY MOUTH ONCE  DAILY WITH MEAL OR IMMEDIATELY  FOLLOWING 03/07/24   Patwardhan, Newman PARAS, MD  Multiple Vitamin (MULTIVITAMIN WITH MINERALS) TABS tablet Take 1 tablet by mouth daily. Patient not taking: Reported on 03/21/2024 03/16/24   Pudota, Kingsley P, MD    Allergies: Percodan [oxycodone -aspirin ], Prednisone, Amoxicillin-pot clavulanate, and Lisinopril    Review of Systems  Updated Vital Signs BP 132/87 (BP Location: Left Arm)   Pulse 68   Temp 98 F (36.7  C) (Oral)   Resp 18   SpO2 96%   Physical Exam Vitals and nursing note reviewed.  Constitutional:      General: She is not in acute distress.    Appearance: She is not toxic-appearing.  HENT:     Head: Normocephalic.     Nose: Nose normal.     Mouth/Throat:     Mouth: Mucous membranes are moist.   Eyes:     Conjunctiva/sclera: Conjunctivae normal.    Cardiovascular:     Rate and Rhythm: Normal rate and regular rhythm.  Pulmonary:     Effort: Pulmonary effort is normal.     Breath sounds: Normal breath sounds.  Abdominal:     General: Abdomen is flat. There is no distension.     Tenderness: There is no abdominal tenderness. There is no guarding or rebound.   Musculoskeletal:     Comments: Chest all stable nontender.  Pelvis stable nontender.  Some tenderness and deformity to the left upper extremity.  Otherwise no bony tenderness.   Skin:    Capillary Refill: Capillary refill takes less than 2 seconds.   Neurological:     Mental Status: She is alert. Mental status is at baseline.   Psychiatric:        Mood and Affect: Mood normal.        Behavior: Behavior normal.     (all labs ordered are listed, but only abnormal results  are displayed) Labs Reviewed  CBC - Abnormal; Notable for the following components:      Result Value   WBC 11.9 (*)    RDW 15.6 (*)    All other components within normal limits  BASIC METABOLIC PANEL WITH GFR - Abnormal; Notable for the following components:   CO2 20 (*)    Glucose, Bld 137 (*)    Anion gap 17 (*)    All other components within normal limits  CK - Abnormal; Notable for the following components:   Total CK 30 (*)    All other components within normal limits    EKG: None  Radiology: CT Shoulder Left Wo Contrast Result Date: 05/08/2024 CLINICAL DATA:  Trauma EXAM: CT OF THE UPPER LEFT EXTREMITY WITHOUT CONTRAST TECHNIQUE: Multidetector CT imaging of the upper left extremity was performed according to the standard  protocol. RADIATION DOSE REDUCTION: This exam was performed according to the departmental dose-optimization program which includes automated exposure control, adjustment of the mA and/or kV according to patient size and/or use of iterative reconstruction technique. COMPARISON:  Shoulder x-ray same day FINDINGS: Bones/Joint/Cartilage There is a transverse impacted left humeral neck fracture. The distal fracture fragment is displaced anteriorly 1 cm and impacted 18 mm. Nondisplaced comminuted fracture involving the lateral and anterior aspect of the humeral head. Ligaments Suboptimally assessed by CT. Muscles and Tendons There is intramuscular edema surrounding the fracture. Soft tissues Left pacemaker present. IMPRESSION: 1. Impacted and displaced left humeral neck fracture. 2. Nondisplaced comminuted fracture involving the lateral and anterior aspect of the humeral head. Electronically Signed   By: Greig Pique M.D.   On: 05/08/2024 18:55   DG Humerus Left Result Date: 05/08/2024 CLINICAL DATA:  fall EXAM: LEFT HUMERUS - 2+ VIEW COMPARISON:  X-ray left shoulder 05/08/2024 FINDINGS: Redemonstration of an acute displaced and comminuted fracture of the proximal humerus involving the head and likely anatomical neck. No acute fracture of the humerus more distally. Elbow is grossly unremarkable. Soft tissues are unremarkable. IMPRESSION: 1. Redemonstration of an acute displaced and comminuted fracture of the proximal humerus involving the head and likely anatomical neck. 2. No acute fracture of the humerus more distally. Electronically Signed   By: Morgane  Naveau M.D.   On: 05/08/2024 18:07   DG Shoulder Left Result Date: 05/08/2024 CLINICAL DATA:  Fall EXAM: LEFT SHOULDER - 2+ VIEW COMPARISON:  None Available. FINDINGS: Impaction fracture of the LEFT humeral head. Posterior tuberosity is avulsed. No dislocation. IMPRESSION: Impaction fracture of the LEFT humeral head. Electronically Signed   By: Jackquline Boxer  M.D.   On: 05/08/2024 17:50   CT Head Wo Contrast Result Date: 05/08/2024 CLINICAL DATA:  Head trauma, minor (Age >= 65y) EXAM: CT HEAD WITHOUT CONTRAST TECHNIQUE: Contiguous axial images were obtained from the base of the skull through the vertex without intravenous contrast. RADIATION DOSE REDUCTION: This exam was performed according to the departmental dose-optimization program which includes automated exposure control, adjustment of the mA and/or kV according to patient size and/or use of iterative reconstruction technique. COMPARISON:  None Available. FINDINGS: Brain: There is atrophy and chronic small vessel disease changes. No acute intracranial abnormality. Specifically, no hemorrhage, hydrocephalus, mass lesion, acute infarction, or significant intracranial injury. Vascular: No hyperdense vessel or unexpected calcification. Skull: No acute calvarial abnormality. Sinuses/Orbits: No acute findings. Mucosal thickening throughout the paranasal sinuses. Mastoid air cells clear. Other: None IMPRESSION: Atrophy, chronic microvascular disease. No acute intracranial abnormality. Chronic sinusitis. Electronically Signed   By: Franky Crease  M.D.   On: 05/08/2024 17:43     Procedures   Medications Ordered in the ED  HYDROcodone -acetaminophen  (NORCO/VICODIN) 5-325 MG per tablet 1 tablet (1 tablet Oral Given 05/08/24 1837)  HYDROcodone -acetaminophen  (NORCO/VICODIN) 5-325 MG per tablet 2 tablet (2 tablets Oral Given 05/08/24 1942)    Clinical Course as of 05/09/24 0005  Sun May 08, 2024  1831 Spoke with Dr. Celena; reviewing images. Tentative plan to sling and f/u on wed.   [TY]    Clinical Course User Index [TY] Neysa Caron PARAS, DO                                 Medical Decision Making This is an 83 year old female with dementia presented to the emergency department after presumed fall found on the ground.  Deformity to her left humerus.  She is afebrile vital signs reassuring.  Given clear nature  of fall basic labs obtained primarily to exclude rhabdomyolysis.  She has no elevated CK.  Normal kidney function.  No significant metabolic derangements.  Minor elevation in her white blood cell count which I suspect is reactionary to acute humerus fracture.  Discussed case with orthopedics, Dr. Celena; see ED course.  Plan to follow-up outpatient.  Patient splint.  Given pain medications.  Family agreeable to plan.  Stable for discharge  Amount and/or Complexity of Data Reviewed Independent Historian:     Details: Family provided history External Data Reviewed:     Details: Not on blood thinner Labs: ordered. Decision-making details documented in ED Course. Radiology: ordered and independent interpretation performed.    Details: Fracture to the left humerus ECG/medicine tests: ordered and independent interpretation performed.  Risk Prescription drug management. Decision regarding hospitalization. Diagnosis or treatment significantly limited by social determinants of health.      Final diagnoses:  Closed fracture of proximal end of left humerus, unspecified fracture morphology, initial encounter    ED Discharge Orders          Ordered    HYDROcodone -acetaminophen  (NORCO/VICODIN) 5-325 MG tablet  Every 4 hours PRN        05/08/24 1931               Neysa Caron PARAS, DO 05/09/24 0005

## 2024-05-11 ENCOUNTER — Other Ambulatory Visit: Payer: Self-pay | Admitting: Licensed Clinical Social Worker

## 2024-05-11 DIAGNOSIS — S42242A 4-part fracture of surgical neck of left humerus, initial encounter for closed fracture: Secondary | ICD-10-CM | POA: Diagnosis not present

## 2024-05-12 DIAGNOSIS — I129 Hypertensive chronic kidney disease with stage 1 through stage 4 chronic kidney disease, or unspecified chronic kidney disease: Secondary | ICD-10-CM | POA: Diagnosis not present

## 2024-05-12 DIAGNOSIS — M545 Low back pain, unspecified: Secondary | ICD-10-CM | POA: Diagnosis not present

## 2024-05-12 DIAGNOSIS — J45909 Unspecified asthma, uncomplicated: Secondary | ICD-10-CM | POA: Diagnosis not present

## 2024-05-12 DIAGNOSIS — Z95810 Presence of automatic (implantable) cardiac defibrillator: Secondary | ICD-10-CM | POA: Diagnosis not present

## 2024-05-12 DIAGNOSIS — R296 Repeated falls: Secondary | ICD-10-CM | POA: Diagnosis not present

## 2024-05-12 DIAGNOSIS — N1832 Chronic kidney disease, stage 3b: Secondary | ICD-10-CM | POA: Diagnosis not present

## 2024-05-12 DIAGNOSIS — Z556 Problems related to health literacy: Secondary | ICD-10-CM | POA: Diagnosis not present

## 2024-05-12 DIAGNOSIS — I429 Cardiomyopathy, unspecified: Secondary | ICD-10-CM | POA: Diagnosis not present

## 2024-05-12 DIAGNOSIS — E7800 Pure hypercholesterolemia, unspecified: Secondary | ICD-10-CM | POA: Diagnosis not present

## 2024-05-12 DIAGNOSIS — E039 Hypothyroidism, unspecified: Secondary | ICD-10-CM | POA: Diagnosis not present

## 2024-05-12 DIAGNOSIS — I480 Paroxysmal atrial fibrillation: Secondary | ICD-10-CM | POA: Diagnosis not present

## 2024-05-13 DIAGNOSIS — M545 Low back pain, unspecified: Secondary | ICD-10-CM | POA: Diagnosis not present

## 2024-05-13 DIAGNOSIS — I429 Cardiomyopathy, unspecified: Secondary | ICD-10-CM | POA: Diagnosis not present

## 2024-05-13 DIAGNOSIS — E7800 Pure hypercholesterolemia, unspecified: Secondary | ICD-10-CM | POA: Diagnosis not present

## 2024-05-13 DIAGNOSIS — J45909 Unspecified asthma, uncomplicated: Secondary | ICD-10-CM | POA: Diagnosis not present

## 2024-05-13 DIAGNOSIS — E039 Hypothyroidism, unspecified: Secondary | ICD-10-CM | POA: Diagnosis not present

## 2024-05-13 DIAGNOSIS — I129 Hypertensive chronic kidney disease with stage 1 through stage 4 chronic kidney disease, or unspecified chronic kidney disease: Secondary | ICD-10-CM | POA: Diagnosis not present

## 2024-05-13 DIAGNOSIS — N1832 Chronic kidney disease, stage 3b: Secondary | ICD-10-CM | POA: Diagnosis not present

## 2024-05-13 DIAGNOSIS — Z95810 Presence of automatic (implantable) cardiac defibrillator: Secondary | ICD-10-CM | POA: Diagnosis not present

## 2024-05-13 DIAGNOSIS — I480 Paroxysmal atrial fibrillation: Secondary | ICD-10-CM | POA: Diagnosis not present

## 2024-05-13 DIAGNOSIS — Z556 Problems related to health literacy: Secondary | ICD-10-CM | POA: Diagnosis not present

## 2024-05-13 DIAGNOSIS — R296 Repeated falls: Secondary | ICD-10-CM | POA: Diagnosis not present

## 2024-05-15 DIAGNOSIS — J45909 Unspecified asthma, uncomplicated: Secondary | ICD-10-CM | POA: Diagnosis not present

## 2024-05-15 DIAGNOSIS — Z556 Problems related to health literacy: Secondary | ICD-10-CM | POA: Diagnosis not present

## 2024-05-15 DIAGNOSIS — I429 Cardiomyopathy, unspecified: Secondary | ICD-10-CM | POA: Diagnosis not present

## 2024-05-15 DIAGNOSIS — E7800 Pure hypercholesterolemia, unspecified: Secondary | ICD-10-CM | POA: Diagnosis not present

## 2024-05-15 DIAGNOSIS — N1832 Chronic kidney disease, stage 3b: Secondary | ICD-10-CM | POA: Diagnosis not present

## 2024-05-15 DIAGNOSIS — I129 Hypertensive chronic kidney disease with stage 1 through stage 4 chronic kidney disease, or unspecified chronic kidney disease: Secondary | ICD-10-CM | POA: Diagnosis not present

## 2024-05-15 DIAGNOSIS — M545 Low back pain, unspecified: Secondary | ICD-10-CM | POA: Diagnosis not present

## 2024-05-15 DIAGNOSIS — I480 Paroxysmal atrial fibrillation: Secondary | ICD-10-CM | POA: Diagnosis not present

## 2024-05-15 DIAGNOSIS — E039 Hypothyroidism, unspecified: Secondary | ICD-10-CM | POA: Diagnosis not present

## 2024-05-15 DIAGNOSIS — Z95810 Presence of automatic (implantable) cardiac defibrillator: Secondary | ICD-10-CM | POA: Diagnosis not present

## 2024-05-15 DIAGNOSIS — R296 Repeated falls: Secondary | ICD-10-CM | POA: Diagnosis not present

## 2024-05-17 DIAGNOSIS — J45909 Unspecified asthma, uncomplicated: Secondary | ICD-10-CM | POA: Diagnosis not present

## 2024-05-17 DIAGNOSIS — E039 Hypothyroidism, unspecified: Secondary | ICD-10-CM | POA: Diagnosis not present

## 2024-05-17 DIAGNOSIS — E7800 Pure hypercholesterolemia, unspecified: Secondary | ICD-10-CM | POA: Diagnosis not present

## 2024-05-17 DIAGNOSIS — I429 Cardiomyopathy, unspecified: Secondary | ICD-10-CM | POA: Diagnosis not present

## 2024-05-17 DIAGNOSIS — R296 Repeated falls: Secondary | ICD-10-CM | POA: Diagnosis not present

## 2024-05-17 DIAGNOSIS — I129 Hypertensive chronic kidney disease with stage 1 through stage 4 chronic kidney disease, or unspecified chronic kidney disease: Secondary | ICD-10-CM | POA: Diagnosis not present

## 2024-05-17 DIAGNOSIS — Z95810 Presence of automatic (implantable) cardiac defibrillator: Secondary | ICD-10-CM | POA: Diagnosis not present

## 2024-05-17 DIAGNOSIS — N1832 Chronic kidney disease, stage 3b: Secondary | ICD-10-CM | POA: Diagnosis not present

## 2024-05-17 DIAGNOSIS — I480 Paroxysmal atrial fibrillation: Secondary | ICD-10-CM | POA: Diagnosis not present

## 2024-05-17 DIAGNOSIS — M545 Low back pain, unspecified: Secondary | ICD-10-CM | POA: Diagnosis not present

## 2024-05-17 DIAGNOSIS — Z556 Problems related to health literacy: Secondary | ICD-10-CM | POA: Diagnosis not present

## 2024-05-18 ENCOUNTER — Other Ambulatory Visit: Payer: Self-pay

## 2024-05-18 ENCOUNTER — Other Ambulatory Visit: Payer: Self-pay | Admitting: Licensed Clinical Social Worker

## 2024-05-18 ENCOUNTER — Encounter (HOSPITAL_COMMUNITY): Payer: Self-pay

## 2024-05-18 ENCOUNTER — Emergency Department (HOSPITAL_COMMUNITY)
Admission: EM | Admit: 2024-05-18 | Discharge: 2024-05-19 | Disposition: A | Discharge status: Home / Self Care | Attending: Emergency Medicine | Admitting: Emergency Medicine

## 2024-05-18 ENCOUNTER — Emergency Department (HOSPITAL_COMMUNITY)

## 2024-05-18 DIAGNOSIS — I509 Heart failure, unspecified: Secondary | ICD-10-CM | POA: Diagnosis not present

## 2024-05-18 DIAGNOSIS — Z95810 Presence of automatic (implantable) cardiac defibrillator: Secondary | ICD-10-CM | POA: Diagnosis not present

## 2024-05-18 DIAGNOSIS — R059 Cough, unspecified: Secondary | ICD-10-CM | POA: Diagnosis not present

## 2024-05-18 DIAGNOSIS — R030 Elevated blood-pressure reading, without diagnosis of hypertension: Secondary | ICD-10-CM | POA: Diagnosis not present

## 2024-05-18 DIAGNOSIS — Z79899 Other long term (current) drug therapy: Secondary | ICD-10-CM | POA: Insufficient documentation

## 2024-05-18 DIAGNOSIS — M545 Low back pain, unspecified: Secondary | ICD-10-CM | POA: Diagnosis not present

## 2024-05-18 DIAGNOSIS — Z950 Presence of cardiac pacemaker: Secondary | ICD-10-CM | POA: Diagnosis not present

## 2024-05-18 DIAGNOSIS — I130 Hypertensive heart and chronic kidney disease with heart failure and stage 1 through stage 4 chronic kidney disease, or unspecified chronic kidney disease: Secondary | ICD-10-CM | POA: Insufficient documentation

## 2024-05-18 DIAGNOSIS — N1832 Chronic kidney disease, stage 3b: Secondary | ICD-10-CM | POA: Diagnosis not present

## 2024-05-18 DIAGNOSIS — R350 Frequency of micturition: Secondary | ICD-10-CM | POA: Diagnosis not present

## 2024-05-18 DIAGNOSIS — R0602 Shortness of breath: Secondary | ICD-10-CM | POA: Diagnosis not present

## 2024-05-18 DIAGNOSIS — J45909 Unspecified asthma, uncomplicated: Secondary | ICD-10-CM | POA: Diagnosis not present

## 2024-05-18 DIAGNOSIS — I129 Hypertensive chronic kidney disease with stage 1 through stage 4 chronic kidney disease, or unspecified chronic kidney disease: Secondary | ICD-10-CM | POA: Diagnosis not present

## 2024-05-18 DIAGNOSIS — I480 Paroxysmal atrial fibrillation: Secondary | ICD-10-CM | POA: Diagnosis not present

## 2024-05-18 DIAGNOSIS — J40 Bronchitis, not specified as acute or chronic: Secondary | ICD-10-CM

## 2024-05-18 DIAGNOSIS — R296 Repeated falls: Secondary | ICD-10-CM | POA: Diagnosis not present

## 2024-05-18 DIAGNOSIS — E7800 Pure hypercholesterolemia, unspecified: Secondary | ICD-10-CM | POA: Diagnosis not present

## 2024-05-18 DIAGNOSIS — I429 Cardiomyopathy, unspecified: Secondary | ICD-10-CM | POA: Diagnosis not present

## 2024-05-18 DIAGNOSIS — Z853 Personal history of malignant neoplasm of breast: Secondary | ICD-10-CM | POA: Diagnosis not present

## 2024-05-18 DIAGNOSIS — I5022 Chronic systolic (congestive) heart failure: Secondary | ICD-10-CM | POA: Diagnosis not present

## 2024-05-18 DIAGNOSIS — Z556 Problems related to health literacy: Secondary | ICD-10-CM | POA: Diagnosis not present

## 2024-05-18 DIAGNOSIS — E039 Hypothyroidism, unspecified: Secondary | ICD-10-CM | POA: Insufficient documentation

## 2024-05-18 LAB — URINALYSIS, W/ REFLEX TO CULTURE (INFECTION SUSPECTED)
Bacteria, UA: NONE SEEN
Bilirubin Urine: NEGATIVE
Glucose, UA: NEGATIVE mg/dL
Hgb urine dipstick: NEGATIVE
Ketones, ur: NEGATIVE mg/dL
Nitrite: NEGATIVE
Protein, ur: NEGATIVE mg/dL
Specific Gravity, Urine: 1.017 (ref 1.005–1.030)
pH: 6 (ref 5.0–8.0)

## 2024-05-18 LAB — BASIC METABOLIC PANEL WITH GFR
Anion gap: 13 (ref 5–15)
BUN: 24 mg/dL — ABNORMAL HIGH (ref 8–23)
CO2: 23 mmol/L (ref 22–32)
Calcium: 9.1 mg/dL (ref 8.9–10.3)
Chloride: 98 mmol/L (ref 98–111)
Creatinine, Ser: 0.82 mg/dL (ref 0.44–1.00)
GFR, Estimated: >60 mL/min (ref >60)
Glucose, Bld: 103 mg/dL — ABNORMAL HIGH (ref 70–99)
Potassium: 3.8 mmol/L (ref 3.5–5.1)
Sodium: 134 mmol/L — ABNORMAL LOW (ref 135–145)

## 2024-05-18 LAB — CBC
HCT: 37.1 % (ref 36.0–46.0)
Hemoglobin: 12 g/dL (ref 12.0–15.0)
MCH: 31.3 pg (ref 26.0–34.0)
MCHC: 32.3 g/dL (ref 30.0–36.0)
MCV: 96.6 fL (ref 80.0–100.0)
Platelets: 255 10*3/uL (ref 150–400)
RBC: 3.84 MIL/uL — ABNORMAL LOW (ref 3.87–5.11)
RDW: 15 % (ref 11.5–15.5)
WBC: 10.3 10*3/uL (ref 4.0–10.5)
nRBC: 0 % (ref 0.0–0.2)

## 2024-05-18 LAB — BRAIN NATRIURETIC PEPTIDE: B Natriuretic Peptide: 45 pg/mL (ref 0.0–100.0)

## 2024-05-18 MED ORDER — IPRATROPIUM-ALBUTEROL 0.5-2.5 (3) MG/3ML IN SOLN
3.0000 mL | Freq: Once | RESPIRATORY_TRACT | Status: AC
  Administered 2024-05-18: 3 mL via RESPIRATORY_TRACT
  Filled 2024-05-18: qty 3

## 2024-05-18 MED ORDER — HYDROCODONE-ACETAMINOPHEN 5-325 MG PO TABS
2.0000 | ORAL_TABLET | Freq: Once | ORAL | Status: AC
  Administered 2024-05-18: 2 via ORAL
  Filled 2024-05-18: qty 2

## 2024-05-18 NOTE — ED Triage Notes (Signed)
 Pt bib family from urgent care, pt was instructed to come here for CHF evaluation, pt had xray that showed fluid build up, pt family also concerned that pt may have UTI, having frequent urination and dark urine. Pt also fell and has broken left shoulder in sling.

## 2024-05-19 DIAGNOSIS — J45909 Unspecified asthma, uncomplicated: Secondary | ICD-10-CM | POA: Diagnosis not present

## 2024-05-19 DIAGNOSIS — Z95810 Presence of automatic (implantable) cardiac defibrillator: Secondary | ICD-10-CM | POA: Diagnosis not present

## 2024-05-19 DIAGNOSIS — E7800 Pure hypercholesterolemia, unspecified: Secondary | ICD-10-CM | POA: Diagnosis not present

## 2024-05-19 DIAGNOSIS — R296 Repeated falls: Secondary | ICD-10-CM | POA: Diagnosis not present

## 2024-05-19 DIAGNOSIS — I129 Hypertensive chronic kidney disease with stage 1 through stage 4 chronic kidney disease, or unspecified chronic kidney disease: Secondary | ICD-10-CM | POA: Diagnosis not present

## 2024-05-19 DIAGNOSIS — E039 Hypothyroidism, unspecified: Secondary | ICD-10-CM | POA: Diagnosis not present

## 2024-05-19 DIAGNOSIS — N1832 Chronic kidney disease, stage 3b: Secondary | ICD-10-CM | POA: Diagnosis not present

## 2024-05-19 DIAGNOSIS — Z556 Problems related to health literacy: Secondary | ICD-10-CM | POA: Diagnosis not present

## 2024-05-19 DIAGNOSIS — I480 Paroxysmal atrial fibrillation: Secondary | ICD-10-CM | POA: Diagnosis not present

## 2024-05-19 DIAGNOSIS — M545 Low back pain, unspecified: Secondary | ICD-10-CM | POA: Diagnosis not present

## 2024-05-19 DIAGNOSIS — I429 Cardiomyopathy, unspecified: Secondary | ICD-10-CM | POA: Diagnosis not present

## 2024-05-19 NOTE — ED Notes (Signed)
 Pt/family received d/c paperwork at this time. After going over the paperwork any questions, comments, or concerns were answered to the best of this nurse's knowledge. The pt/family verbally acknowledged the teachings/instructions.

## 2024-05-19 NOTE — Discharge Instructions (Addendum)
 We evaluated Erin Novak for her cough and urinary symptoms.  Her testing in the emergency department is reassuring.  Her chest x-ray did not show any pneumonia or fluid in her lungs, and her other testing was reassuring including her white blood cell count and BNP (test looking for heart failure).  We also checked her urine test and it did not show signs of a urine infection.  We suspect that her cough might be due to bronchitis, inflammation in the larger tubes of your lung.  This type of infection is usually caused by a virus and does not need antibiotics.  Please have her follow-up closely with her primary care physician.  If she has any new or worsening symptoms such as fevers, increasing weakness, worsening cough or coughing up phlegm, difficulty breathing, lightheadedness or dizziness, fainting, or any other concerning symptoms, please bring her back to the emergency department for a recheck.

## 2024-05-19 NOTE — Patient Instructions (Signed)
 Visit Information  Thank you for taking time to visit with me today. Please don't hesitate to contact me if I can be of assistance to you before our next scheduled appointment.  Your next care management appointment is by telephone on 07/02 at 10 AM   Please call the care guide team at [phone] if you need to cancel, schedule, or reschedule an appointment.   Please call the Suicide and Crisis Lifeline: 988 go to [facility] [address], [place] [phone]) call 911 if you are experiencing a Mental Health or Behavioral Health Crisis or need someone to talk to.  Rolin Kerns, LCSW [place]  [facility], [facility] Clinical Social Worker Direct Dial: [phone]  Fax: [phone] Website: [email or web address] 8:13 AM
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
## 2024-05-19 NOTE — Patient Outreach (Signed)
 Complex Care Management   Visit Note  05/11/2024  Name:  Erin Novak MRN: [id number] DOB: Oct 05, 1941  Situation: Referral received for Complex Care Management related to Dementia I obtained verbal consent from Caregiver.  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 No VHR episodes. Health trends (patient activity & average heart rates) are stable. CorVue impedance monitoring does not suggest recent fluid accumulation. longevity is 6.4 - 7.0 years. RA pacing is 83 %, RV pacing is >99 %, and LV pacing is >99 %.   Personal history of radiation therapy 2018   PONV (postoperative nausea and vomiting)    Seasonal allergies    Sinus node dysfunction (HCC) 07/12/2019   Thyroid  disease    hypothyroid    Assessment: Patient Reported Symptoms:  Cognitive Cognitive Status: Normal speech and language skills Cognitive/Intellectual Conditions Management [RPT]: None reported or documented in medical history or problem list      Neurological Neurological Review of Symptoms: No symptoms reported    HEENT HEENT Symptoms Reported: Not assessed      Cardiovascular Cardiovascular Symptoms Reported: Not assessed    Respiratory Respiratory Symptoms Reported: Not assesed    Endocrine Endocrine Symptoms  Reported: Not assessed    Gastrointestinal Gastrointestinal Symptoms Reported: Not assessed      Genitourinary Genitourinary Symptoms Reported: Not assessed    Integumentary Integumentary Symptoms Reported: Not assessed    Musculoskeletal          Psychosocial Additional Psychological Details: Caregiver stress reported            07/08/2017    9:10 AM  Depression screen PHQ 2/9  Decreased Interest 0  Down, Depressed, Hopeless 0  PHQ - 2 Score 0    There were no vitals filed for this visit.  Medications Reviewed Today     Reviewed by Ezzard Rolin BIRCH, LCSW (Social Worker) on 05/19/24 at 0808  Med List Status: <None>   Medication Order Taking? Sig Documenting Provider Last Dose Status Informant  acetaminophen  (TYLENOL ) 500 MG tablet [id number] No Take 1,000 mg by mouth every 6 (six) hours as needed for mild pain (pain score 1-3). [provider] Past Week Active Family Member, Pharmacy Records  amLODipine  (NORVASC ) 10 MG tablet [id number] No Take 10 mg by mouth daily. [provider] 05/18/2024 Morning Active Family Member, Pharmacy Records  anastrozole  (ARIMIDEX ) 1 MG tablet [id number] No TAKE 1 TABLET BY MOUTH DAILY Patwardhan, Newman PARAS, MD 05/18/2024 Morning Active Family Member, Pharmacy Records  atorvastatin  (LIPITOR) 10 MG tablet [id number] No TAKE 1 TABLET BY MOUTH DAILY Patwardhan, Newman PARAS, MD 05/18/2024 Morning Active Family Member, Pharmacy Records  HYDROcodone -acetaminophen  (NORCO/VICODIN) 5-325 MG tablet [id number] No Take 1-2 tablets  by mouth every 4 (four) hours as needed.  Patient taking differently: Take 1-2 tablets by mouth every 4 (four) hours as needed for moderate pain (pain score 4-6).   Neysa Caron PARAS, DO 05/18/2024  3:00 PM Active Family Member, Pharmacy Records  levothyroxine  (SYNTHROID ) 88 MCG tablet [id number] No TAKE 1 TABLET BY MOUTH IN THE  MORNING Patwardhan, Newman PARAS, MD 05/18/2024 Morning Active Family Member, Pharmacy Records  lidocaine   (LIDODERM ) 5 % [id number] No Place 1 patch onto the skin daily. Remove & Discard patch within 12 hours or as directed by MD [provider] 05/18/2024  7:00 AM Active Family Member, Pharmacy Records  memantine  (NAMENDA ) 10 MG tablet [id number] No Take 10 mg by mouth 2 (two) times daily. [provider] 05/18/2024 Morning Active Family Member, Pharmacy Records  metoprolol  succinate (TOPROL -XL) 100 MG 24 hr tablet [id number] No TAKE 1 TABLET BY MOUTH ONCE  DAILY WITH MEAL OR IMMEDIATELY  FOLLOWING Patwardhan, Manish J, MD 05/18/2024 Morning Active Family Member, Pharmacy Records  oxyCODONE -acetaminophen  (PERCOCET/ROXICET) 5-325 MG tablet [id number] No Take 1 tablet by mouth every 6 (six) hours as needed for moderate pain (pain score 4-6). [provider] Unknown Active Family Member, Pharmacy Records           Med Note (WARD, CHUCK MATSU   Wed May 18, 2024  8:57 PM) Pt has not started this medication yet  QUEtiapine (SEROQUEL) 25 MG tablet [id number] No Take 12.5 mg by mouth daily as needed (mood). [provider] 05/14/2024 Active Family Member, Pharmacy Records  sertraline (ZOLOFT) 25 MG tablet [id number] No Take 25 mg by mouth daily. [provider] 05/17/2024 Bedtime Active Family Member, Pharmacy Records  spironolactone  (ALDACTONE ) 50 MG tablet [id number] No Take 50 mg by mouth daily. [provider] 05/18/2024 Morning Active Family Member, Pharmacy Records  Med List Note (Ward, Angelica, CPhT 03/10/24 1849): Anything long term medication-wise should be sent to [facility]            Recommendation:   Specialty provider follow-up Ortho Continue Current Plan of Care  Follow Up Plan:   Telephone follow-up in 1 month  Rolin Kerns, LCSW [facility]  [facility], [facility] Clinical Social Worker Direct Dial: [phone]  Fax: [phone] Website: [email or web address] 8:13 AM

## 2024-05-24 DIAGNOSIS — I480 Paroxysmal atrial fibrillation: Secondary | ICD-10-CM | POA: Diagnosis not present

## 2024-05-24 DIAGNOSIS — Z95810 Presence of automatic (implantable) cardiac defibrillator: Secondary | ICD-10-CM | POA: Diagnosis not present

## 2024-05-24 DIAGNOSIS — E039 Hypothyroidism, unspecified: Secondary | ICD-10-CM | POA: Diagnosis not present

## 2024-05-24 DIAGNOSIS — I129 Hypertensive chronic kidney disease with stage 1 through stage 4 chronic kidney disease, or unspecified chronic kidney disease: Secondary | ICD-10-CM | POA: Diagnosis not present

## 2024-05-24 DIAGNOSIS — R296 Repeated falls: Secondary | ICD-10-CM | POA: Diagnosis not present

## 2024-05-24 DIAGNOSIS — I429 Cardiomyopathy, unspecified: Secondary | ICD-10-CM | POA: Diagnosis not present

## 2024-05-24 DIAGNOSIS — M545 Low back pain, unspecified: Secondary | ICD-10-CM | POA: Diagnosis not present

## 2024-05-24 DIAGNOSIS — E7800 Pure hypercholesterolemia, unspecified: Secondary | ICD-10-CM | POA: Diagnosis not present

## 2024-05-24 DIAGNOSIS — N1832 Chronic kidney disease, stage 3b: Secondary | ICD-10-CM | POA: Diagnosis not present

## 2024-05-24 DIAGNOSIS — J45909 Unspecified asthma, uncomplicated: Secondary | ICD-10-CM | POA: Diagnosis not present

## 2024-05-24 DIAGNOSIS — Z556 Problems related to health literacy: Secondary | ICD-10-CM | POA: Diagnosis not present

## 2024-05-24 NOTE — Patient Outreach (Signed)
 Complex Care Management   Visit Note  05/18/2024  Name:  Erin Novak MRN: [id number] DOB: [birth year]  Situation: Referral received for Complex Care Management related to Dementia I obtained verbal consent from Caregiver.  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 No VHR episodes. Health trends (patient activity & average heart rates) are stable. CorVue impedance monitoring does not suggest recent fluid accumulation. longevity is 6.4 - 7.0 years. RA pacing is 83 %, RV pacing is >99 %, and LV pacing is >99 %.   Personal history of radiation therapy 2018   PONV (postoperative nausea and vomiting)    Seasonal allergies    Sinus node dysfunction (HCC) 07/12/2019   Thyroid  disease    hypothyroid    Assessment: Patient Reported Symptoms:  Cognitive Cognitive Status: No symptoms reported      Neurological Neurological Review of Symptoms: No symptoms reported    HEENT HEENT Symptoms Reported: Not assessed      Cardiovascular Cardiovascular Symptoms Reported: Not assessed    Respiratory Respiratory Symptoms Reported: Not assesed    Endocrine Endocrine Symptoms Reported: Not assessed    Gastrointestinal Gastrointestinal Symptoms Reported: Not assessed      Genitourinary Genitourinary  Symptoms Reported: Pain/burning with urination Additional Genitourinary Details: Per family, orders have been completed to test for UTI Genitourinary Management Strategies: Medication therapy  Integumentary Integumentary Symptoms Reported: Not assessed    Musculoskeletal Musculoskelatal Symptoms Reviewed: Muscle pain        Psychosocial Additional Psychological Details: Caregiver stress reported Behavioral Management Strategies: Support system, Coping strategies Major Change/Loss/Stressor/Fears (CP): Medical condition, self        07/08/2017    9:10 AM  Depression screen PHQ 2/9  Decreased Interest 0  Down, Depressed, Hopeless 0  PHQ - 2 Score 0    There were no vitals filed for this visit.  Medications Reviewed Today   Medications were not reviewed in this encounter     Recommendation:   PCP Follow-up Continue Current Plan of Care  Follow Up Plan:   Telephone follow-up 1-2 weeks  Rolin Kerns, LCSW [place]  [facility], [facility] Clinical Social Worker Direct Dial: [phone]  Fax: [phone] Website: [email or web address] 8:12 AM

## 2024-05-24 NOTE — Patient Instructions (Signed)
 Visit Information  Thank you for taking time to visit with me today. Please don't hesitate to contact me if I can be of assistance to you before our next scheduled appointment.  Your next care management appointment is by telephone on 07/16 at 1:30 PM  Please call the care guide team at [phone] if you need to cancel, schedule, or reschedule an appointment.   Please call the Suicide and Crisis Lifeline: 988 go to [facility] Urgent [facility] [address] [place] [phone]) call 911 if you are experiencing a Mental Health or Behavioral Health Crisis or need someone to talk to.  Rolin Kerns, LCSW [place]  [facility], [facility] Clinical Social Worker Direct Dial: [phone]  Fax: [phone] Website: [email or web address] 8:12 AM

## 2024-05-25 DIAGNOSIS — I480 Paroxysmal atrial fibrillation: Secondary | ICD-10-CM | POA: Diagnosis not present

## 2024-05-25 DIAGNOSIS — R296 Repeated falls: Secondary | ICD-10-CM | POA: Diagnosis not present

## 2024-05-25 DIAGNOSIS — M545 Low back pain, unspecified: Secondary | ICD-10-CM | POA: Diagnosis not present

## 2024-05-25 DIAGNOSIS — J45909 Unspecified asthma, uncomplicated: Secondary | ICD-10-CM | POA: Diagnosis not present

## 2024-05-25 DIAGNOSIS — E7800 Pure hypercholesterolemia, unspecified: Secondary | ICD-10-CM | POA: Diagnosis not present

## 2024-05-25 DIAGNOSIS — I129 Hypertensive chronic kidney disease with stage 1 through stage 4 chronic kidney disease, or unspecified chronic kidney disease: Secondary | ICD-10-CM | POA: Diagnosis not present

## 2024-05-25 DIAGNOSIS — Z556 Problems related to health literacy: Secondary | ICD-10-CM | POA: Diagnosis not present

## 2024-05-25 DIAGNOSIS — Z95810 Presence of automatic (implantable) cardiac defibrillator: Secondary | ICD-10-CM | POA: Diagnosis not present

## 2024-05-25 DIAGNOSIS — S42242D 4-part fracture of surgical neck of left humerus, subsequent encounter for fracture with routine healing: Secondary | ICD-10-CM | POA: Diagnosis not present

## 2024-05-25 DIAGNOSIS — I429 Cardiomyopathy, unspecified: Secondary | ICD-10-CM | POA: Diagnosis not present

## 2024-05-25 DIAGNOSIS — E039 Hypothyroidism, unspecified: Secondary | ICD-10-CM | POA: Diagnosis not present

## 2024-05-25 DIAGNOSIS — N1832 Chronic kidney disease, stage 3b: Secondary | ICD-10-CM | POA: Diagnosis not present

## 2024-06-01 ENCOUNTER — Encounter: Payer: Self-pay | Admitting: Licensed Clinical Social Worker

## 2024-06-01 ENCOUNTER — Telehealth: Payer: Self-pay | Admitting: Licensed Clinical Social Worker

## 2024-06-06 NOTE — Progress Notes (Signed)
 Remote pacemaker transmission.

## 2024-06-07 ENCOUNTER — Ambulatory Visit: Attending: Cardiology | Admitting: Pharmacist

## 2024-06-07 VITALS — BP 114/74 | HR 76

## 2024-06-07 DIAGNOSIS — I10 Essential (primary) hypertension: Secondary | ICD-10-CM | POA: Diagnosis not present

## 2024-06-07 NOTE — Assessment & Plan Note (Signed)
 Assessment: Patient's blood pressure well-controlled in clinic today Per daughter when physical therapy comes to the house they state patient's blood pressure is good I do have some concerns about patient no longer being on appropriate GDMT for her recovered EF.  However Entresto  and spironolactone  were stopped due to AKI.  Patient has had numerous ER visits for falls.  She has been off of Entresto  since April.  Recent BNP normal.  I am concerned with her dementia that resuming Entresto  and spironolactone  may put her at higher risk of more falls and another AKI Patient's EF recovered after pacemaker CRT and medication therapy  Plan: Continue metoprolol  100 mg daily and amlodipine  10 mg daily Follow-up with Dr. Elmira in August

## 2024-06-07 NOTE — Progress Notes (Signed)
 Patient ID: Erin Novak                 DOB: 08-Jul-1941                      MRN: [id number]      HPI: Daneen Volcy is a 83 y.o. female referred by Dr. Elmira to HTN clinic. PMH is significant for  hypertension, hyperlipidemia nonischemic cardiomyopathy (EF improved to 45-50% in 2020), s/p CRT-P w/recovered EF, paroxysmal A-fib, h/o breast cancer s/p radiation, dementia. She was seen by Dr. Elmira 02/17/24. BP was 177/81. Amlodipine  was increased to 10mg  daily.   Since that visit, patient has been seen in the ER multiple times for falls, lacerations, AKI, humerus fracture. She was admitted 4/29 for AKI and diverticulitis in the end of April. ALL her BP meds were stopped except metoprolol . She was discharged to rehab. It appears all meds were resumed except Entresto .  Patient presents today accompanied by her daughter.  Patient is able to give very little information.  Most information provided by her daughter.  It appears that both Entresto  and spironolactone  have been stopped.  Blood pressure is well-controlled on metoprolol  and amlodipine .  Denies any shortness of breath or swelling.  Her spironolactone  was on the discharge list from rehab but daughter does not think patient has been taking.  Daughter concerned about mother's cough.  Patient denies any shortness of breath and does not seem concerned about her cough.  She was seen in the ER on 7/ 2 for this.  BNP was normal as well as her chest x-ray.   Current HTN meds: metoprolol  succinate 100 mg daily, amlodipine  10mg  daily Previously tried:  BP goal: <130/80  Home BP readings:  None available  Wt Readings from Last 3 Encounters:  05/18/24 182 lb 5.1 oz (82.7 kg)  03/15/24 182 lb 4.8 oz (82.7 kg)  03/10/24 183 lb 10.3 oz (83.3 kg)   BP Readings from Last 3 Encounters:  06/07/24 114/74  05/19/24 128/85  05/08/24 132/87   Pulse Readings from Last 3 Encounters:  06/07/24 76  05/19/24 73  05/08/24 68    Renal  function: CrCl cannot be calculated (Unknown ideal weight.).  Past Medical History:  Diagnosis Date   Abdominal pain    Asthma    Breast cancer, left breast (HCC) 2018   Cholelithiasis 06-20-2011   US     Chronic systolic heart failure (HCC) 10/29/2018   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 No VHR episodes. Health trends (patient activity & average heart rates) are stable. CorVue impedance monitoring does not suggest recent fluid accumulation. longevity is 6.4 - 7.0 years. RA pacing is 83 %, RV pacing is >99 %, and LV pacing is >99 %.   Personal history of radiation therapy 2018   PONV (postoperative nausea and vomiting)    Seasonal allergies    Sinus node dysfunction (HCC) 07/12/2019   Thyroid  disease    hypothyroid  Current Outpatient Medications on File Prior to Visit  Medication Sig Dispense Refill   amLODipine  (NORVASC ) 10 MG tablet Take 10 mg by mouth daily.     anastrozole  (ARIMIDEX ) 1 MG tablet TAKE 1 TABLET BY MOUTH DAILY 100 tablet 2   atorvastatin  (LIPITOR) 10 MG tablet TAKE 1 TABLET BY MOUTH DAILY 90 tablet 3   levothyroxine  (SYNTHROID ) 88 MCG tablet TAKE 1 TABLET BY MOUTH IN THE  MORNING 100 tablet 2   memantine  (NAMENDA ) 10 MG tablet Take 10 mg by mouth 2 (two) times daily.     metoprolol  succinate (TOPROL -XL) 100 MG 24 hr tablet TAKE 1 TABLET BY MOUTH ONCE  DAILY WITH MEAL OR IMMEDIATELY  FOLLOWING 90 tablet 3   oxyCODONE -acetaminophen  (PERCOCET/ROXICET) 5-325 MG tablet Take 1 tablet by mouth every 6 (six) hours as needed for  moderate pain (pain score 4-6).     QUEtiapine (SEROQUEL) 25 MG tablet Take 12.5 mg by mouth daily as needed (mood).     sertraline (ZOLOFT) 25 MG tablet Take 25 mg by mouth daily.     acetaminophen  (TYLENOL ) 500 MG tablet Take 1,000 mg by mouth every 6 (six) hours as needed for mild pain (pain score 1-3).     lidocaine  (LIDODERM ) 5 % Place 1 patch onto the skin daily. Remove & Discard patch within 12 hours or as directed by MD     spironolactone  (ALDACTONE ) 50 MG tablet Take 50 mg by mouth daily. (Patient not taking: Reported on 06/07/2024)     No current facility-administered medications on file prior to visit.    Allergies  Allergen Reactions   Percodan [Oxycodone -Aspirin ] Nausea And Vomiting and Other (See Comments)    Makes pt sweat, unable to sleep   Prednisone Other (See Comments)    Makes pt sweat, unable to sleep   Amoxicillin-Pot Clavulanate Dermatitis and Rash   Lisinopril Cough    Blood pressure 114/74, pulse 76.   Assessment/Plan:     1. Hypertension -  Essential hypertension Assessment: Patient's blood pressure well-controlled in clinic today Per daughter when physical therapy comes to the house they state patient's blood pressure is good I do have some concerns about patient no longer being on appropriate GDMT for her recovered EF.  However Entresto  and spironolactone  were stopped due to AKI.  Patient has had numerous ER visits for falls.  She has been off of Entresto  since April.  Recent BNP normal.  I am concerned with her dementia that resuming Entresto  and spironolactone  may put her at higher risk of more falls and another AKI Patient's EF recovered after pacemaker CRT and medication therapy  Plan: Continue metoprolol  100 mg daily and amlodipine  10 mg daily Follow-up with Dr. Elmira in August    Thank you  Kirby Argueta D Madalina Rosman, Pharm.JONETTA SARAN, CPP [facility] A Division of [facility] [address], [place], [place] [address]   Phone: [phone]; Fax: [phone]

## 2024-06-07 NOTE — Patient Instructions (Addendum)
 Continue metoprolol  succinate 100mg  daily and amlodipine  10mg  daily Follow up with Dr. Elmira in Sept Please contact us  if you have any shortness of breath or increased swelling

## 2024-06-08 DIAGNOSIS — N1832 Chronic kidney disease, stage 3b: Secondary | ICD-10-CM | POA: Diagnosis not present

## 2024-06-08 DIAGNOSIS — E039 Hypothyroidism, unspecified: Secondary | ICD-10-CM | POA: Diagnosis not present

## 2024-06-08 DIAGNOSIS — I129 Hypertensive chronic kidney disease with stage 1 through stage 4 chronic kidney disease, or unspecified chronic kidney disease: Secondary | ICD-10-CM | POA: Diagnosis not present

## 2024-06-08 DIAGNOSIS — S42242D 4-part fracture of surgical neck of left humerus, subsequent encounter for fracture with routine healing: Secondary | ICD-10-CM | POA: Diagnosis not present

## 2024-06-10 NOTE — Patient Instructions (Signed)
 [name] [name] - I am sorry I was unable to reach you today for our scheduled appointment. I work with Collins, Dana, DO and am calling to support your healthcare needs. Please contact me at [phone] at your earliest convenience. I look forward to speaking with you soon.   Thank you,  Rolin Kerns, LCSW [place]  [facility], [facility] Clinical Social Worker Direct Dial: [phone]  Fax: [phone] Website: [email or web address] 9:38 AM

## 2024-06-11 DIAGNOSIS — R2681 Unsteadiness on feet: Secondary | ICD-10-CM | POA: Diagnosis not present

## 2024-06-11 DIAGNOSIS — M6281 Muscle weakness (generalized): Secondary | ICD-10-CM | POA: Diagnosis not present

## 2024-06-11 DIAGNOSIS — R262 Difficulty in walking, not elsewhere classified: Secondary | ICD-10-CM | POA: Diagnosis not present

## 2024-06-13 DIAGNOSIS — E039 Hypothyroidism, unspecified: Secondary | ICD-10-CM | POA: Diagnosis not present

## 2024-06-13 DIAGNOSIS — M6281 Muscle weakness (generalized): Secondary | ICD-10-CM | POA: Diagnosis not present

## 2024-06-13 DIAGNOSIS — R2681 Unsteadiness on feet: Secondary | ICD-10-CM | POA: Diagnosis not present

## 2024-06-13 DIAGNOSIS — I340 Nonrheumatic mitral (valve) insufficiency: Secondary | ICD-10-CM | POA: Diagnosis not present

## 2024-06-13 DIAGNOSIS — Z170 Estrogen receptor positive status [ER+]: Secondary | ICD-10-CM | POA: Diagnosis not present

## 2024-06-13 DIAGNOSIS — C50412 Malignant neoplasm of upper-outer quadrant of left female breast: Secondary | ICD-10-CM | POA: Diagnosis not present

## 2024-06-13 DIAGNOSIS — Z950 Presence of cardiac pacemaker: Secondary | ICD-10-CM | POA: Diagnosis not present

## 2024-06-13 DIAGNOSIS — I509 Heart failure, unspecified: Secondary | ICD-10-CM | POA: Diagnosis not present

## 2024-06-13 DIAGNOSIS — K219 Gastro-esophageal reflux disease without esophagitis: Secondary | ICD-10-CM | POA: Diagnosis not present

## 2024-06-13 DIAGNOSIS — E785 Hyperlipidemia, unspecified: Secondary | ICD-10-CM | POA: Diagnosis not present

## 2024-06-13 DIAGNOSIS — N1832 Chronic kidney disease, stage 3b: Secondary | ICD-10-CM | POA: Diagnosis not present

## 2024-06-13 DIAGNOSIS — I10 Essential (primary) hypertension: Secondary | ICD-10-CM | POA: Diagnosis not present

## 2024-06-13 DIAGNOSIS — R262 Difficulty in walking, not elsewhere classified: Secondary | ICD-10-CM | POA: Diagnosis not present

## 2024-06-13 DIAGNOSIS — I480 Paroxysmal atrial fibrillation: Secondary | ICD-10-CM | POA: Diagnosis not present

## 2024-06-13 DIAGNOSIS — I428 Other cardiomyopathies: Secondary | ICD-10-CM | POA: Diagnosis not present

## 2024-06-14 DIAGNOSIS — M6281 Muscle weakness (generalized): Secondary | ICD-10-CM | POA: Diagnosis not present

## 2024-06-14 DIAGNOSIS — R262 Difficulty in walking, not elsewhere classified: Secondary | ICD-10-CM | POA: Diagnosis not present

## 2024-06-14 DIAGNOSIS — R2681 Unsteadiness on feet: Secondary | ICD-10-CM | POA: Diagnosis not present

## 2024-06-15 DIAGNOSIS — R2681 Unsteadiness on feet: Secondary | ICD-10-CM | POA: Diagnosis not present

## 2024-06-15 DIAGNOSIS — M6281 Muscle weakness (generalized): Secondary | ICD-10-CM | POA: Diagnosis not present

## 2024-06-15 DIAGNOSIS — R262 Difficulty in walking, not elsewhere classified: Secondary | ICD-10-CM | POA: Diagnosis not present

## 2024-06-16 DIAGNOSIS — R262 Difficulty in walking, not elsewhere classified: Secondary | ICD-10-CM | POA: Diagnosis not present

## 2024-06-16 DIAGNOSIS — M6281 Muscle weakness (generalized): Secondary | ICD-10-CM | POA: Diagnosis not present

## 2024-06-16 DIAGNOSIS — R2681 Unsteadiness on feet: Secondary | ICD-10-CM | POA: Diagnosis not present

## 2024-06-17 DIAGNOSIS — R262 Difficulty in walking, not elsewhere classified: Secondary | ICD-10-CM | POA: Diagnosis not present

## 2024-06-17 DIAGNOSIS — M6281 Muscle weakness (generalized): Secondary | ICD-10-CM | POA: Diagnosis not present

## 2024-06-17 DIAGNOSIS — R2681 Unsteadiness on feet: Secondary | ICD-10-CM | POA: Diagnosis not present

## 2024-06-20 DIAGNOSIS — R262 Difficulty in walking, not elsewhere classified: Secondary | ICD-10-CM | POA: Diagnosis not present

## 2024-06-20 DIAGNOSIS — R2681 Unsteadiness on feet: Secondary | ICD-10-CM | POA: Diagnosis not present

## 2024-06-20 DIAGNOSIS — M6281 Muscle weakness (generalized): Secondary | ICD-10-CM | POA: Diagnosis not present

## 2024-06-21 DIAGNOSIS — R2681 Unsteadiness on feet: Secondary | ICD-10-CM | POA: Diagnosis not present

## 2024-06-21 DIAGNOSIS — R262 Difficulty in walking, not elsewhere classified: Secondary | ICD-10-CM | POA: Diagnosis not present

## 2024-06-21 DIAGNOSIS — M6281 Muscle weakness (generalized): Secondary | ICD-10-CM | POA: Diagnosis not present

## 2024-06-22 DIAGNOSIS — R2681 Unsteadiness on feet: Secondary | ICD-10-CM | POA: Diagnosis not present

## 2024-06-22 DIAGNOSIS — M6281 Muscle weakness (generalized): Secondary | ICD-10-CM | POA: Diagnosis not present

## 2024-06-22 DIAGNOSIS — R262 Difficulty in walking, not elsewhere classified: Secondary | ICD-10-CM | POA: Diagnosis not present

## 2024-06-23 DIAGNOSIS — R2681 Unsteadiness on feet: Secondary | ICD-10-CM | POA: Diagnosis not present

## 2024-06-23 DIAGNOSIS — R262 Difficulty in walking, not elsewhere classified: Secondary | ICD-10-CM | POA: Diagnosis not present

## 2024-06-24 DIAGNOSIS — R262 Difficulty in walking, not elsewhere classified: Secondary | ICD-10-CM | POA: Diagnosis not present

## 2024-06-24 DIAGNOSIS — R2681 Unsteadiness on feet: Secondary | ICD-10-CM | POA: Diagnosis not present

## 2024-06-27 DIAGNOSIS — R262 Difficulty in walking, not elsewhere classified: Secondary | ICD-10-CM | POA: Diagnosis not present

## 2024-06-27 DIAGNOSIS — R2681 Unsteadiness on feet: Secondary | ICD-10-CM | POA: Diagnosis not present

## 2024-06-28 DIAGNOSIS — R2681 Unsteadiness on feet: Secondary | ICD-10-CM | POA: Diagnosis not present

## 2024-06-28 DIAGNOSIS — R262 Difficulty in walking, not elsewhere classified: Secondary | ICD-10-CM | POA: Diagnosis not present

## 2024-06-29 DIAGNOSIS — R262 Difficulty in walking, not elsewhere classified: Secondary | ICD-10-CM | POA: Diagnosis not present

## 2024-06-29 DIAGNOSIS — M6281 Muscle weakness (generalized): Secondary | ICD-10-CM | POA: Diagnosis not present

## 2024-06-29 DIAGNOSIS — R2681 Unsteadiness on feet: Secondary | ICD-10-CM | POA: Diagnosis not present

## 2024-07-11 DIAGNOSIS — K219 Gastro-esophageal reflux disease without esophagitis: Secondary | ICD-10-CM | POA: Diagnosis not present

## 2024-07-13 ENCOUNTER — Ambulatory Visit (INDEPENDENT_AMBULATORY_CARE_PROVIDER_SITE_OTHER): Payer: Self-pay

## 2024-07-13 DIAGNOSIS — I428 Other cardiomyopathies: Secondary | ICD-10-CM

## 2024-07-13 LAB — CUP PACEART REMOTE DEVICE CHECK
Battery Remaining Longevity: 28 mo
Battery Remaining Percentage: 31 %
Battery Voltage: 2.96 V
Brady Statistic AP VP Percent: 27 %
Brady Statistic AP VS Percent: 1 %
Brady Statistic AS VP Percent: 69 %
Brady Statistic AS VS Percent: 2 %
Brady Statistic RA Percent Paced: 25 %
Date Time Interrogation Session: 20250827023650
Implantable Lead Connection Status: 753985
Implantable Lead Connection Status: 753985
Implantable Lead Connection Status: 753985
Implantable Lead Implant Date: 20191213
Implantable Lead Implant Date: 20191213
Implantable Lead Implant Date: 20191213
Implantable Lead Location: 753858
Implantable Lead Location: 753859
Implantable Lead Location: 753860
Implantable Pulse Generator Implant Date: 20191213
Lead Channel Impedance Value: 1350 Ohm
Lead Channel Impedance Value: 350 Ohm
Lead Channel Impedance Value: 590 Ohm
Lead Channel Pacing Threshold Amplitude: 0.75 V
Lead Channel Pacing Threshold Amplitude: 0.875 V
Lead Channel Pacing Threshold Amplitude: 1.5 V
Lead Channel Pacing Threshold Pulse Width: 0.5 ms
Lead Channel Pacing Threshold Pulse Width: 0.5 ms
Lead Channel Pacing Threshold Pulse Width: 0.5 ms
Lead Channel Sensing Intrinsic Amplitude: 1.2 mV
Lead Channel Sensing Intrinsic Amplitude: 12 mV
Lead Channel Setting Pacing Amplitude: 1.75 V
Lead Channel Setting Pacing Amplitude: 2 V
Lead Channel Setting Pacing Amplitude: 2.5 V
Lead Channel Setting Pacing Pulse Width: 0.5 ms
Lead Channel Setting Pacing Pulse Width: 0.5 ms
Lead Channel Setting Sensing Sensitivity: 2 mV
Pulse Gen Model: 3562
Pulse Gen Serial Number: 9064261

## 2024-07-14 ENCOUNTER — Ambulatory Visit: Payer: Self-pay | Admitting: Internal Medicine

## 2024-07-22 ENCOUNTER — Ambulatory Visit: Attending: Cardiology | Admitting: Cardiology

## 2024-07-22 ENCOUNTER — Encounter: Payer: Self-pay | Admitting: Cardiology

## 2024-07-22 VITALS — BP 129/67 | HR 71 | Resp 16 | Ht 63.0 in | Wt 145.0 lb

## 2024-07-22 DIAGNOSIS — I480 Paroxysmal atrial fibrillation: Secondary | ICD-10-CM | POA: Diagnosis not present

## 2024-07-22 DIAGNOSIS — I428 Other cardiomyopathies: Secondary | ICD-10-CM

## 2024-07-22 DIAGNOSIS — I10 Essential (primary) hypertension: Secondary | ICD-10-CM

## 2024-07-22 DIAGNOSIS — R296 Repeated falls: Secondary | ICD-10-CM | POA: Diagnosis not present

## 2024-07-22 MED ORDER — AMLODIPINE BESYLATE 5 MG PO TABS: 5.0000 mg | ORAL_TABLET | Freq: Every day | ORAL | 3 refills | Status: AC

## 2024-07-22 NOTE — Patient Instructions (Signed)
 Medication Instructions:  STOP Spironolactone    REDUCE Amlodipine  to 5 mg daily *If you need a refill on your cardiac medications before your next appointment, please call your pharmacy*  Lab Work: NONE If you have labs (blood work) drawn today and your tests are completely normal, you will receive your results only by: MyChart Message (if you have MyChart) OR A paper copy in the mail If you have any lab test that is abnormal or we need to change your treatment, we will call you to review the results.  Testing/Procedures: NONE  Follow-Up: At [facility], you and your health needs are our priority.  As part of our continuing mission to provide you with exceptional heart care, our providers are all part of one team.  This team includes your primary Cardiologist (physician) and Advanced Practice Providers or APPs (Physician Assistants and Nurse Practitioners) who all work together to provide you with the care you need, when you need it.  Your next appointment:   6 Months   Provider:   Newman JINNY Lawrence, MD    We recommend signing up for the patient portal called MyChart.  Sign up information is provided on this After Visit Summary.  MyChart is used to connect with patients for Virtual Visits (Telemedicine).  Patients are able to view lab/test results, encounter notes, upcoming appointments, etc.  Non-urgent messages can be sent to your provider as well.   To learn more about what you can do with MyChart, go to [email or web address].

## 2024-07-22 NOTE — Progress Notes (Signed)
 Cardiology Office Note:  .   Date:  07/22/2024  ID:  Erin Novak, DOB November 27, 1940, MRN [id number] PCP: Gerome Brunet, DO  [facility] Providers Cardiologist:  Newman Lawrence, MD PCP: Gerome Brunet, DO  Chief Complaint  Patient presents with   Chronic systolic heart failure   Follow-up     Erin Novak is a 83 y.o. female with hypertension, hyperlipidemia nonischemic cardiomyopathy, s/p CRT-P w/recovered EF, paroxysmal A-fib, h/o breast cancer s/p radiation  Patient underwent cardioversion today since her last visit with me, she was seen in emergency room.  In 04/2024, she was brought to emergency room with shoulder/arm pain, after being found down by family members.  History was limited due to patient's dementia.  She could have been on the ground for 2 hours or longer.  She denied any chest pain or shortness of breath at that time.  She was found to have some deformity to her left humerus, otherwise labs were normal.  She was recommended follow-up with orthopedic surgeon Dr. Celena, recommended conservative management for her fracture.  Subsequently, patient was in the emergency room in early 05/2024 with complaints of shortness of breath.  Workup did not suggest heart failure, bronchitis was suspected.  She was discharged home after reassuring workup.  Subsequently, she saw Pharm.D. clinic in late 05/2024.  Given controlled blood pressure, no changes were made to her current medications.   Patient is here for follow-up today with she is currently already on several rehab.  She has become fairly sedentary, and uses walker/wheelchair for minimal ambulation.  She has lost several pounds of weight in the last few months, appetite has gone down as well.  There has been increasing concerns regarding her memory.   Vitals:   07/22/24 1107  BP: 129/67  Pulse: 71  Resp: 16  SpO2: 92%       Review of Systems  Cardiovascular:  Negative for chest pain, dyspnea on exertion, leg  swelling, palpitations and syncope.  Musculoskeletal:  Positive for falls.        Studies Reviewed: SABRA        Independently interpreted 05/2024: Hb 12.0 Cr 0.82, Na 134 BNP 45  09/2023: Chol 165, TG 173, HDL 45, LDL 90 HbA1C 6.1% Cr 1.1 TSH 3.5   Physical Exam Vitals and nursing note reviewed.  Constitutional:      General: She is not in acute distress. Neck:     Vascular: No JVD.  Cardiovascular:     Rate and Rhythm: Normal rate and regular rhythm.     Heart sounds: Normal heart sounds. No murmur heard. Pulmonary:     Effort: Pulmonary effort is normal.     Breath sounds: Normal breath sounds. No wheezing or rales.  Musculoskeletal:     Right lower leg: No edema.     Left lower leg: No edema.      VISIT DIAGNOSES:   ICD-10-CM   1. Essential hypertension  I10     2. Nonischemic cardiomyopathy (HCC)  I42.8     3. Paroxysmal atrial fibrillation (HCC)  I48.0     4. Recurrent falls  R29.6         Erin Novak is a 83 y.o. female with hypertension, hyperlipidemia nonischemic cardiomyopathy, s/p CRT-P w/recovered EF, paroxysmal A-fib, h/o breast cancer s/p radiation  Assessment & Plan  Hypertension: Controlled. Given recent falls, would like to avoid low blood pressure as much as possible. Reduce amlodipine  from 10 mg to  5 mg daily.  Okay to continue metoprolol  succinate at 100 mg daily. She is not on any other GDMT for HFrEF with recent discontinuation of Entresto  and spironolactone  after recurrent falls as well.    Nonischemic cardiomyopathy: S/p CRT-PE. Clinically compensated.   She is only on metoprolol  succinate 100 mg daily at this point for GDMT. She is not on any other GDMT for HFrEF with recent discontinuation of Entresto  and spironolactone  after recurrent falls. It is reasonable to monitor her for any recurrence of heart failure.  Dementia and falls have become priority at this time.  Paroxysmal Afib: Only episodes of A-fib seen during  her acute systolic heart failure and severe MR TR in 2019.  No episodes of A-fib seen since CRT-P.  Patient had difficulty with the cost of Eliquis .  Given her advanced age, bleeding risk is not insignificant.  Given no documented recurrence of A-fib since 2019, both patient, her daughter, and we mutually decided to discontinue anticoagulation in 01/2022. No Afib noted on pacemaker interrogation. With recurrent falls, patient is better off without anticoagulation.  Meds ordered this encounter  Medications   amLODipine  (NORVASC ) 5 MG tablet    Sig: Take 1 tablet (5 mg total) by mouth daily.    Dispense:  90 tablet    Refill:  3    F/u w/me in 6 months  Signed, Newman JINNY Lawrence, MD

## 2024-08-01 NOTE — Progress Notes (Signed)
 Remote PPM Transmission

## 2024-08-03 DIAGNOSIS — N1832 Chronic kidney disease, stage 3b: Secondary | ICD-10-CM | POA: Diagnosis not present

## 2024-08-03 DIAGNOSIS — I480 Paroxysmal atrial fibrillation: Secondary | ICD-10-CM | POA: Diagnosis not present

## 2024-08-08 DIAGNOSIS — Z9109 Other allergy status, other than to drugs and biological substances: Secondary | ICD-10-CM | POA: Diagnosis not present

## 2024-08-08 DIAGNOSIS — I10 Essential (primary) hypertension: Secondary | ICD-10-CM | POA: Diagnosis not present

## 2024-08-12 ENCOUNTER — Telehealth: Payer: Self-pay | Admitting: *Deleted

## 2024-08-12 NOTE — Progress Notes (Signed)
 Complex Care Management Care Guide Note  08/12/2024 Name: Erin Novak MRN: [id number] DOB: January 31, 1941  Erin Novak Erin Novak is a 83 y.o. year old female who is a primary care patient of Gerome Brunet, DO and is actively engaged with the care management team. I reached out to Erin Novak Erin Novak by phone today to assist with re-scheduling  with the Licensed Clinical Social Worker.  Follow up plan: Unsuccessful telephone outreach attempt made. A HIPAA compliant phone message was left for the patient providing contact information and requesting a return call.  [name], CMA, Care Guide [facility]  [facility], [facility] Guide Direct Dial: [phone]  Fax: [phone] Website: [email or web address]

## 2024-08-16 NOTE — Progress Notes (Signed)
 Complex Care Management Care Guide Note  08/16/2024 Name: Erin Novak MRN: [id number] DOB: December 11, 1940  [name] Erin Novak is a 83 y.o. year old female who is a primary care patient of Gerome Brunet, DO and is actively engaged with the care management team. I reached out to [name] [name] by phone today to assist with re-scheduling  with the Licensed Clinical Social Worker.  Follow up plan: per pt daughter pt now resides in nursing home - declined need to reschedule   [name], CMA [facility]  [facility], [facility] Guide Direct Dial: [phone]  Fax: [phone] Website: [email or web address]

## 2024-09-27 ENCOUNTER — Ambulatory Visit

## 2024-09-27 DIAGNOSIS — M79609 Pain in unspecified limb: Secondary | ICD-10-CM | POA: Diagnosis not present

## 2024-09-27 DIAGNOSIS — B351 Tinea unguium: Secondary | ICD-10-CM

## 2024-09-28 NOTE — Progress Notes (Signed)
  Subjective:  Patient ID: Erin Novak, female    DOB: 02/08/1941,  MRN: [id number]  83 y.o. female presents preventative diabetic foot care for painful mycotic toenails x 10 which interfere with daily activities. She denies neuropathic symptoms. She is here with a representative from her facility.   Chief Complaint  Patient presents with   RFC    Rm6 Routine foot care     PCP is Gerome Brunet, DO   Allergies  Allergen Reactions   Percodan [Oxycodone -Aspirin ] Nausea And Vomiting and Other (See Comments)    Makes pt sweat, unable to sleep   Prednisone Other (See Comments)    Makes pt sweat, unable to sleep   Amoxicillin-Pot Clavulanate Dermatitis and Rash   Lisinopril Cough    Review of Systems: Negative except as noted in the HPI.   Objective:  Erin Novak is a pleasant 83 y.o. female in NAD. AAO x 3.  Vascular Examination: Faintly palpable pedal pulses. CFT <4 seconds b/l. Pedal hair absent. No edema. No pain with calf compression b/l. Skin temperature gradient WNL b/l. No varicosities noted. No cyanosis or clubbing noted.  Neurological Examination: Sensation grossly intact b/l with 10 gram monofilament. Negative tinel sign at tarsal tunnel bilaterally.   Dermatological Examination: Pedal skin with normal turgor, texture and tone b/l. No open wounds nor interdigital macerations noted. Toenails 1-5 b/l thick, discolored, elongated with subungual debris and pain on dorsal palpation. No hyperkeratotic lesions noted b/l.   Musculoskeletal Examination: Muscle strength 5/5 to b/l LE.  No pain, crepitus noted b/l. No gross pedal deformities. Patient ambulates independently without assistive aids.       Assessment:   1. Pain due to onychomycosis of nail     Plan:  Consent given for treatment. Patient examined. All patient's and/or POA's questions/concerns addressed on today's visit. Toenails 1-5 b/l debrided in length and girth without incident. Continue soft,  supportive shoe gear daily. Report any pedal injuries to medical professional. Call office if there are any questions/concerns. -Patient/POA to call should there be question/concern in the interim.  RTC 3 months with Dr. Loreda, DPM  Prentice Ovens, DPM AACFAS Fellowship Trained Podiatric Surgeon [facility]       [place] LOCATION: [address] [place] [address]                   Office [phone]   [facility] LOCATION: [address] [place], [place] [address] Office [phone]

## 2024-10-12 ENCOUNTER — Ambulatory Visit: Payer: Self-pay

## 2024-10-12 DIAGNOSIS — I428 Other cardiomyopathies: Secondary | ICD-10-CM

## 2024-10-14 LAB — CUP PACEART REMOTE DEVICE CHECK
Battery Remaining Longevity: 24 mo
Battery Remaining Percentage: 28 %
Battery Voltage: 2.95 V
Brady Statistic AP VP Percent: 34 %
Brady Statistic AP VS Percent: 1 %
Brady Statistic AS VP Percent: 61 %
Brady Statistic AS VS Percent: 1.8 %
Brady Statistic RA Percent Paced: 32 %
Date Time Interrogation Session: 20251126042123
Implantable Lead Connection Status: 753985
Implantable Lead Connection Status: 753985
Implantable Lead Connection Status: 753985
Implantable Lead Implant Date: 20191213
Implantable Lead Implant Date: 20191213
Implantable Lead Implant Date: 20191213
Implantable Lead Location: 753858
Implantable Lead Location: 753859
Implantable Lead Location: 753860
Implantable Pulse Generator Implant Date: 20191213
Lead Channel Impedance Value: 1200 Ohm
Lead Channel Impedance Value: 330 Ohm
Lead Channel Impedance Value: 590 Ohm
Lead Channel Pacing Threshold Amplitude: 0.75 V
Lead Channel Pacing Threshold Amplitude: 0.75 V
Lead Channel Pacing Threshold Amplitude: 1.125 V
Lead Channel Pacing Threshold Pulse Width: 0.5 ms
Lead Channel Pacing Threshold Pulse Width: 0.5 ms
Lead Channel Pacing Threshold Pulse Width: 0.5 ms
Lead Channel Sensing Intrinsic Amplitude: 0.9 mV
Lead Channel Sensing Intrinsic Amplitude: 11.8 mV
Lead Channel Setting Pacing Amplitude: 1.75 V
Lead Channel Setting Pacing Amplitude: 2 V
Lead Channel Setting Pacing Amplitude: 2.125
Lead Channel Setting Pacing Pulse Width: 0.5 ms
Lead Channel Setting Pacing Pulse Width: 0.5 ms
Lead Channel Setting Sensing Sensitivity: 2 mV
Pulse Gen Model: 3562
Pulse Gen Serial Number: 9064261

## 2024-10-17 NOTE — Progress Notes (Signed)
 Remote PPM Transmission

## 2024-10-20 ENCOUNTER — Ambulatory Visit: Payer: Self-pay | Admitting: Internal Medicine

## 2024-12-27 ENCOUNTER — Ambulatory Visit
